# Patient Record
Sex: Male | Born: 1945 | Race: White | Marital: Married
Health system: Northeastern US, Academic
[De-identification: ages and names within clinical notes are randomized; demographics above are authoritative.]

## PROBLEM LIST (undated history)

## (undated) DIAGNOSIS — K922 Gastrointestinal hemorrhage, unspecified: Secondary | ICD-10-CM

## (undated) DIAGNOSIS — F411 Generalized anxiety disorder: Secondary | ICD-10-CM

## (undated) DIAGNOSIS — C4359 Malignant melanoma of other part of trunk: Secondary | ICD-10-CM

## (undated) DIAGNOSIS — Z9861 Coronary angioplasty status: Secondary | ICD-10-CM

## (undated) DIAGNOSIS — H9190 Unspecified hearing loss, unspecified ear: Secondary | ICD-10-CM

## (undated) DIAGNOSIS — M1990 Unspecified osteoarthritis, unspecified site: Secondary | ICD-10-CM

## (undated) DIAGNOSIS — I10 Essential (primary) hypertension: Secondary | ICD-10-CM

## (undated) DIAGNOSIS — E785 Hyperlipidemia, unspecified: Secondary | ICD-10-CM

## (undated) DIAGNOSIS — I2510 Atherosclerotic heart disease of native coronary artery without angina pectoris: Secondary | ICD-10-CM

## (undated) HISTORY — DX: Essential (primary) hypertension: I10

## (undated) HISTORY — DX: Gastrointestinal hemorrhage, unspecified: K92.2

## (undated) HISTORY — PX: KNEE ARTHROSCOPY W/ MENISCAL REPAIR: SHX1877

## (undated) HISTORY — DX: Unspecified osteoarthritis, unspecified site: M19.90

## (undated) HISTORY — DX: Coronary angioplasty status: Z98.61

## (undated) HISTORY — DX: Generalized anxiety disorder: F41.1

## (undated) HISTORY — DX: Atherosclerotic heart disease of native coronary artery without angina pectoris: I25.10

## (undated) HISTORY — DX: Hyperlipidemia, unspecified: E78.5

## (undated) HISTORY — DX: Unspecified hearing loss, unspecified ear: H91.90

## (undated) HISTORY — PX: CORONARY ANGIOPLASTY WITH STENT PLACEMENT: SHX49

## (undated) HISTORY — DX: Malignant melanoma of other part of trunk: C43.59

---

## 1993-10-10 HISTORY — PX: FRACTURE SURGERY: SHX138

## 2009-10-10 DIAGNOSIS — I2510 Atherosclerotic heart disease of native coronary artery without angina pectoris: Secondary | ICD-10-CM

## 2009-10-10 HISTORY — DX: Atherosclerotic heart disease of native coronary artery without angina pectoris: I25.10

## 2012-10-10 DIAGNOSIS — K922 Gastrointestinal hemorrhage, unspecified: Secondary | ICD-10-CM

## 2012-10-10 HISTORY — DX: Gastrointestinal hemorrhage, unspecified: K92.2

## 2015-02-08 DEATH — deceased

## 2015-12-30 DIAGNOSIS — L590 Erythema ab igne [dermatitis ab igne]: Secondary | ICD-10-CM | POA: Diagnosis not present

## 2015-12-30 DIAGNOSIS — L568 Other specified acute skin changes due to ultraviolet radiation: Secondary | ICD-10-CM | POA: Diagnosis not present

## 2015-12-30 DIAGNOSIS — Z09 Encounter for follow-up examination after completed treatment for conditions other than malignant neoplasm: Secondary | ICD-10-CM | POA: Diagnosis not present

## 2015-12-30 DIAGNOSIS — Z872 Personal history of diseases of the skin and subcutaneous tissue: Secondary | ICD-10-CM | POA: Diagnosis not present

## 2015-12-30 DIAGNOSIS — D225 Melanocytic nevi of trunk: Secondary | ICD-10-CM | POA: Diagnosis not present

## 2015-12-30 DIAGNOSIS — D1801 Hemangioma of skin and subcutaneous tissue: Secondary | ICD-10-CM | POA: Diagnosis not present

## 2015-12-30 DIAGNOSIS — L821 Other seborrheic keratosis: Secondary | ICD-10-CM | POA: Diagnosis not present

## 2015-12-30 DIAGNOSIS — I872 Venous insufficiency (chronic) (peripheral): Secondary | ICD-10-CM | POA: Diagnosis not present

## 2015-12-30 DIAGNOSIS — L814 Other melanin hyperpigmentation: Secondary | ICD-10-CM | POA: Diagnosis not present

## 2016-03-03 DIAGNOSIS — R600 Localized edema: Secondary | ICD-10-CM | POA: Diagnosis not present

## 2016-03-03 DIAGNOSIS — L568 Other specified acute skin changes due to ultraviolet radiation: Secondary | ICD-10-CM | POA: Diagnosis not present

## 2016-03-03 DIAGNOSIS — I872 Venous insufficiency (chronic) (peripheral): Secondary | ICD-10-CM | POA: Diagnosis not present

## 2016-03-04 ENCOUNTER — Ambulatory Visit

## 2016-04-05 ENCOUNTER — Ambulatory Visit: Admitting: Cardiovascular Disease

## 2016-04-05 ENCOUNTER — Ambulatory Visit (HOSPITAL_BASED_OUTPATIENT_CLINIC_OR_DEPARTMENT_OTHER)

## 2016-04-05 DIAGNOSIS — R001 Bradycardia, unspecified: Secondary | ICD-10-CM | POA: Diagnosis not present

## 2016-04-05 DIAGNOSIS — I10 Essential (primary) hypertension: Secondary | ICD-10-CM | POA: Diagnosis not present

## 2016-04-05 DIAGNOSIS — E7800 Pure hypercholesterolemia, unspecified: Secondary | ICD-10-CM | POA: Diagnosis not present

## 2016-04-05 DIAGNOSIS — E668 Other obesity: Secondary | ICD-10-CM | POA: Diagnosis not present
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
## 2016-04-11 ENCOUNTER — Ambulatory Visit: Admitting: Cardiovascular Disease

## 2016-04-11 DIAGNOSIS — I10 Essential (primary) hypertension: Secondary | ICD-10-CM | POA: Diagnosis not present

## 2016-04-11 LAB — HX BASIC METABOLIC PANEL
CASE NUMBER: 2017184000515
HX ANION GAP: 6 — NL (ref 3.0–11.0)
HX BUN: 21 mg/dL — ABNORMAL HIGH (ref 7.0–18.0)
HX CALCIUM LVL: 9.3 mg/dL — NL (ref 8.5–10.1)
HX CHLORIDE: 102 mmol/L — NL (ref 98.0–107.0)
HX CO2: 30 mmol/L — NL (ref 21.0–32.0)
HX CREATININE: 1.01 mg/dL — NL (ref 0.55–1.3)
HX GLUCOSE LVL: 88 mg/dL — NL (ref 65.0–110.0)
HX POTASSIUM LVL: 4.3 mmol/L — NL (ref 3.6–5.2)
HX SODIUM LVL: 138 mmol/L — NL (ref 136.0–145.0)

## 2016-04-11 LAB — HX GLOMERULAR FILTRATION RATE (ESTIMATED)
CASE NUMBER: 2017184000515
HX AFN AMER GLOMERULAR FILTRATION RATE: 88 mL/min/{1.73_m2}
HX NON-AFN AMER GLOMERULAR FILTRATION RATE: 76 mL/min/{1.73_m2}

## 2016-04-12 ENCOUNTER — Ambulatory Visit: Admitting: Cardiovascular Disease

## 2016-04-14 DIAGNOSIS — H903 Sensorineural hearing loss, bilateral: Secondary | ICD-10-CM | POA: Diagnosis not present

## 2016-04-18 ENCOUNTER — Ambulatory Visit

## 2016-04-18 DIAGNOSIS — I10 Essential (primary) hypertension: Secondary | ICD-10-CM | POA: Diagnosis not present

## 2016-04-18 NOTE — Progress Notes (Signed)
* * *         [facility]**        ---    Lawernce Ion. Sindy Messing, MD [facility] Kieth Brightly. Donnetta Simpers, MD [facility] Barbera Setters Byrd Hesselbach, MD  [facility];    Darlyn Chamber, MD [facility] Roosvelt Maser, MD [facility] Shanon Brow, MD [facility]  Nile Dear. Audley Hose, MD [facility]    Kandis Mannan A. Karie Mainland, MD [facility] Johnell Comings. MacNaught, MD [place] Mitchel Honour, MD Erma Pinto, MD [facility]    Colletta Maryland, MD Weston Anna, MD [facility] Kerby Moors, NP Maximino Greenland ,  NP        * * *     **Patient Name:** Jose Lindsey   **Date:** 04/18/2016    --- ---     **DOB:** [birth year]     **Referring Provider:** ANN Vincenza Hews   **Appointment Provider:** Nurse Savaughn Karwowski        * * *    04/18/2016  Progress Notes: Nurse Yohana Bartha    --- ---    ---        Current Medications    ---    Taking     * hydrochlorothiazide 12.5 mg capsule 1 cap(s) orally once a day    ---    * desoximetasone topical 0.25% cream 1 app applied topically 2 times a day    ---    * diphenhydramine 25 mg capsule 1 cap(s) orally Q.D. bedtime    ---    * lisinopril 20 mg tablet 1 tab(s) orally once a day    ---    * clopidogrel 75 mg tablet 1 tab(s) orally once a day    ---    * atorvastatin (Lipitor) 80 mg tablet 1 tab(s) orally once a day (at bedtime)    ---    * aspirin 81 mg enteric coated tablet 1 tab(s) orally once a day    ---    * Colace sodium 100 mg capsule 1 cap(s) orally once a day    ---      Past Medical History    ---      CAD    ---    - angina (2010): RCA- BMS x 2, OM 60%    ---    CHOL    ---    Obesity    ---    Diverticular disease    ---    GIB (2015)    ---        Reason for Appointment    ---      1\. Per dr Karie Mainland    ---    2\. BP check    ---      History of Present Illness    ---     __:    Jose Lindsey is a 70 yr old man Hx of HTN, CAD, hyperlipidemia, obesity last seen  on 04/04/2016. At his last visit, BP was suboptimal and HCTZ 12.5 mg was  prescribed. Reports feeling well from a cardiac perspective. Does c/o  lightheadness when first started taking the  HCTZ but has decreased. He states  has a had 6 lb weight loss and has been limiting the amount of salt he puts on  food. Denies cp, sob, n/v, or swelling.      Vital Signs    ---    HR 58, Ht 73, O2 Sat 97%, Right Arm BP 110/74, Left Arm BP 110/76.      Past Orders    ---  _Lab:BMP (Order Date - 04/11/2016) (Collection Date - 04/11/2016)_    BUN  21  H  7-18 - mg/dL    Creatinine  9.147    0.550-1.300 - mg/dL    Sodium Lvl  829    562-130 - mmol/L    Potassium lvl  4.3    3.6-5.2 - mmol/L    Chloride  102    98-107 - mmol/L    CO2  30    21-32 - mmol/L    Calcium Lvl  9.3    8.5-10.1 - mg/dL    Glucose Lvl  88    86-578 - mg/dL    Anion Gap  6    4-69 -    _Lab:Glomerular Filtration Rate (Estimated)-[facility] (Order Date - 04/11/2016)  (Collection Date - 04/11/2016)_    Afn Amer GFR  88     \- ml/min    Non-Afn Amer GFR  76     \- ml/min      Examination    ---     _HS_ :    General: Well developed and nourished. Pulmonary System clear to auscultation  bilaterally. Cardiac: no murmur, gallop or pericardial rub. Extremities no  clubbing, no edema. Skin: normal, no rash. Neurologic exam: grossly non-focal.  Pyschiatric appropriate.          Impression/Recommendations    ---       **1\. Essential Hypertension**    Clinical Notes: [name] comes into the office today for a BP check. Since his  last visit on 04/04/2016, HCTZ 12.5 mg was prescribed. Reports feeling well.  Recent blood work reviewed with pt. Congratulated pt on 6 lb. weight loss. His  BP today in the office manual cuff right arm 110/74 left arm 110/76 HR 58. I  advised Jose Lindsey to cont with all prescribed medications and follow up with Dr.  Karie Mainland as scheduled. Discussed the effects of dietary sodium intake and  importance of exercise. Jose Lindsey reports has a BP cuff at home and advised to  self monitor BP at home.    ---        **2\. Others**    Notes: Continue to take all your prescribed medications and follow up as  scheduled. Monitor BP at home and if BP trends  over 140/90 to notify the  office. Continue with the success of weight loss. Great job!.    Electronically signed by Teresa Pelton MD on 04/19/2016 at 07:08 AM EDT    Sign off status: Completed        * * *        [facility]    [address]    [place], [place] [address]    Tel: [phone]    Fax: [phone]              * * *          Patient: Jose Lindsey, Jose Lindsey DOB: 02-27-1946 Progress Note: Nurse Victorio Palm  04/18/2016    ---    Note generated by eClinicalWorks EMR/PM Software ([email or web address])

## 2016-06-01 DIAGNOSIS — L568 Other specified acute skin changes due to ultraviolet radiation: Secondary | ICD-10-CM | POA: Diagnosis not present

## 2016-06-01 DIAGNOSIS — I872 Venous insufficiency (chronic) (peripheral): Secondary | ICD-10-CM | POA: Diagnosis not present

## 2016-06-01 DIAGNOSIS — R600 Localized edema: Secondary | ICD-10-CM | POA: Diagnosis not present

## 2016-06-17 ENCOUNTER — Ambulatory Visit: Admitting: Cardiovascular Disease

## 2016-06-17 NOTE — Progress Notes (Signed)
* * *        **  [name]    --- ---    7 Y old Male, DOB: 09-02-1946    [address] [place], [place], [place] [address]    Home: [phone]    Provider: Santo Held, MD        * * *    Telephone Encounter    ---    Answered by   Kirke Shaggy  Date: 06/17/2016         Time: 09:14 AM    Reason   refill    --- ---            Refills  Continue atorvastatin (Lipitor) tablet, 80 mg, orally, 90 tablets, 1  tab(s), once a day (at bedtime), 90 days, Refills=3    --- ---          * * *                ---          * * *          Patient: Jose Lindsey, Jose Lindsey DOB: 25-Aug-1946 Provider: Santo Held, MD  06/17/2016    ---    Note generated by eClinicalWorks EMR/PM Software ([email or web address])

## 2016-06-29 DIAGNOSIS — Z23 Encounter for immunization: Secondary | ICD-10-CM | POA: Diagnosis not present

## 2016-08-26 ENCOUNTER — Ambulatory Visit (HOSPITAL_BASED_OUTPATIENT_CLINIC_OR_DEPARTMENT_OTHER): Admitting: Cardiovascular Disease

## 2016-08-30 ENCOUNTER — Ambulatory Visit (HOSPITAL_BASED_OUTPATIENT_CLINIC_OR_DEPARTMENT_OTHER): Admitting: Cardiovascular Disease

## 2016-08-31 ENCOUNTER — Ambulatory Visit (INDEPENDENT_AMBULATORY_CARE_PROVIDER_SITE_OTHER): Payer: Medicare Other | Admitting: Family Medicine

## 2016-08-31 ENCOUNTER — Encounter: Payer: Self-pay | Admitting: Family Medicine

## 2016-08-31 VITALS — BP 120/74 | HR 64 | Temp 98.6°F | Resp 18 | Ht 73.5 in | Wt 272.0 lb

## 2016-08-31 DIAGNOSIS — H9193 Unspecified hearing loss, bilateral: Secondary | ICD-10-CM | POA: Insufficient documentation

## 2016-08-31 DIAGNOSIS — F411 Generalized anxiety disorder: Secondary | ICD-10-CM

## 2016-08-31 DIAGNOSIS — E785 Hyperlipidemia, unspecified: Secondary | ICD-10-CM

## 2016-08-31 DIAGNOSIS — Z860100 Personal history of colon polyps, unspecified: Secondary | ICD-10-CM

## 2016-08-31 DIAGNOSIS — I152 Hypertension secondary to endocrine disorders: Secondary | ICD-10-CM | POA: Insufficient documentation

## 2016-08-31 DIAGNOSIS — I10 Essential (primary) hypertension: Secondary | ICD-10-CM

## 2016-08-31 DIAGNOSIS — Z7689 Persons encountering health services in other specified circumstances: Secondary | ICD-10-CM

## 2016-08-31 DIAGNOSIS — H903 Sensorineural hearing loss, bilateral: Secondary | ICD-10-CM

## 2016-08-31 DIAGNOSIS — Z8249 Family history of ischemic heart disease and other diseases of the circulatory system: Secondary | ICD-10-CM

## 2016-08-31 DIAGNOSIS — I2510 Atherosclerotic heart disease of native coronary artery without angina pectoris: Secondary | ICD-10-CM | POA: Diagnosis not present

## 2016-08-31 MED ORDER — LISINOPRIL 20 MG PO TABS: 20.0000 mg | ORAL_TABLET | Freq: Every day | ORAL | 3 refills | Status: DC

## 2016-08-31 MED ORDER — HYDROCHLOROTHIAZIDE 12.5 MG PO CAPS: 12.5000 mg | ORAL_CAPSULE | Freq: Every day | ORAL | 3 refills | Status: DC

## 2016-08-31 NOTE — Progress Notes (Signed)
Chief Complaint  Patient presents with  . Establish Care   hee to establish No records available Retired to [employer] from [employer] Here to be closer to daughter and grand daughters  Born premature, has profound hearing loss Very good at reading lips and communicating, has hearing aid R  CAD with stent placement 2020.  No angina. Good exercise tolerance.  Is obese, recently went from 300+ to 272.  Is congratulated.  "Retirement agrees with me"  Had 3 colonoscopies in 3 years for rectal bleeding.  Polyps found.  Most recent this year.  Due in 5 y.  Occasional anxiety.  Has no thad a xanax in months.  Patient Active Problem List   Diagnosis Date Noted  . CAD in native artery 08/31/2016  . Essential hypertension 08/31/2016  . HLD (hyperlipidemia) 08/31/2016  . Family history of heart disease in male family member before age 47 08/31/2016  . 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 Generalized anxiety disorder 08/31/2016  . History of colonic polyps 08/31/2016    Outpatient Encounter Prescriptions as of 08/31/2016  Medication Sig  . ALPRAZolam (XANAX) 0.5 MG tablet Take 0.5 mg by mouth as needed for anxiety.  Marland Kitchen atorvastatin (LIPITOR) 80 MG tablet Take 80 mg by mouth daily.  . clopidogrel (PLAVIX) 75 MG tablet Take 75 mg by mouth daily.  . diphenhydrAMINE (BENADRYL) 25 MG tablet Take 25 mg by mouth as needed.  . docusate sodium (COLACE) 100 MG capsule Take 100 mg by mouth daily as needed for mild constipation.  . hydrochlorothiazide (MICROZIDE) 12.5 MG capsule Take 1 capsule (12.5 mg total) by mouth daily.  Marland Kitchen lisinopril (PRINIVIL,ZESTRIL) 20 MG tablet Take 1 tablet (20 mg total) by mouth daily.   No facility-administered encounter medications on file as of 08/31/2016.     Past Medical History:  Diagnosis Date  . Arthritis   . Hard of hearing   . Hyperlipidemia   . Hypertension     Past Surgical History:  Procedure Laterality Date  . CORONARY ANGIOPLASTY WITH STENT PLACEMENT     . FRACTURE SURGERY  1995   right elbow  . KNEE ARTHROSCOPY W/ MENISCAL REPAIR      Social History   Social History  . Marital status: Married    Spouse name: rose  . Number of children: 2  . Years of education: 13   Occupational History  . retired     packing/shipping   Social History Main Topics  . Smoking status: Former Smoker    Packs/day: 0.30    Types: Cigarettes    Start date: 08/31/1966    Quit date: 08/31/1969  . Smokeless tobacco: Never Used  . Alcohol use 1.8 oz/week    2 Cans of beer, 1 Shots of liquor per week  . Drug use: No  . Sexual activity: Yes    Birth control/ protection: Post-menopausal   Other Topics Concern  . Not on file   Social History Narrative   Retired from [employer] with wife [name]   Two children   Two big dogs    Family History  Problem Relation Age of Onset  . Heart disease Mother 35    heart attack  . Lung disease Father     asbestosis  . Asthma Daughter   . Hyperlipidemia Daughter   . Alcohol abuse Paternal Uncle   . Cancer Paternal Grandfather     lung    Review of Systems  Constitutional: Negative for chills, fever and weight  loss.       Trying to lose weight  HENT: Negative for congestion and hearing loss.   Eyes: Negative for blurred vision and pain.  Respiratory: Negative for cough and shortness of breath.   Cardiovascular: Negative for chest pain and leg swelling.  Gastrointestinal: Negative for abdominal pain, constipation, diarrhea and heartburn.  Genitourinary: Negative for dysuria and frequency.  Musculoskeletal: Positive for joint pain. Negative for falls and myalgias.       Knee arthralgias  Neurological: Negative for dizziness, seizures and headaches.  Psychiatric/Behavioral: Negative for depression. The patient is not nervous/anxious and does not have insomnia.     BP 120/74 (BP Location: Right Arm, Patient Position: Sitting, Cuff Size: Large)   Pulse 64   Temp 98.6 F (37 C) (Oral)   Resp 18    Ht 6' 1.5" (1.867 m)   Wt 272 lb 0.6 oz (123.4 kg)   SpO2 97%   BMI 35.40 kg/m   Physical Exam  Constitutional: He is oriented to person, place, and time. He appears well-developed and well-nourished.  HENT:  Head: Normocephalic and atraumatic.  Right Ear: External ear normal.  Left Ear: External ear normal.  Mouth/Throat: Oropharynx is clear and moist.  Hearing impaired  Eyes: Conjunctivae are normal. Pupils are equal, round, and reactive to light.  Neck: Normal range of motion. Neck supple. No thyromegaly present.  Cardiovascular: Normal rate, regular rhythm and normal heart sounds.   Pulmonary/Chest: Effort normal and breath sounds normal. No respiratory distress.  Abdominal: Soft. Bowel sounds are normal.  obese  Musculoskeletal: Normal range of motion. He exhibits edema.  Lymphadenopathy:    He has no cervical adenopathy.  Neurological: He is alert and oriented to person, place, and time.  Gait normal  Skin: Skin is warm and dry.  Psychiatric: He has a normal mood and affect. His behavior is normal. Thought content normal.  Nursing note and vitals reviewed.  ASSESSMENT/PLAN:  1. CAD in native artery - Ambulatory referral to Cardiology  2. Essential hypertension 3. Hyperlipidemia, unspecified hyperlipidemia type 4. Family history of heart disease in male family member before age 9 5. Sensorineural hearing loss (SNHL) of both ears 6. Generalized anxiety disorder 7. History of colonic polyps 8 . Encounter to establish care with new doctor    Patient Instructions  Need old records Continue to stay active and eat well Congratulations on losing weight  See me in 3 months   Raylene Everts, MD

## 2016-08-31 NOTE — Patient Instructions (Signed)
Need old records Continue to stay active and eat well Congratulations on losing weight  See me in 3 months

## 2016-09-20 ENCOUNTER — Ambulatory Visit (HOSPITAL_BASED_OUTPATIENT_CLINIC_OR_DEPARTMENT_OTHER): Admitting: Cardiovascular Disease

## 2016-10-11 NOTE — Progress Notes (Addendum)
Cardiology Office Note   Date:  10/12/2016   ID:  [name], DOB July 11, 1946, MRN [id number]  PCP:  Blanchie Serve, MD  Cardiologist:   Dorris Carnes, MD   Pt referred for continued care of CAD   History of Present Illness: [name] is a 71 y.o. male with a history of CAD  Seen in primary care  Remote stent  Pt also history of HTN, HL  FHx CAD   Had 3 stents at [facility] in [place]  Evelena Leyden)  Patient has been breathing OK  Just started routine with wife  Cold weather hampers   Back when stents placed had sensation of disicomfort in chest  PRogressed    Then to arms He has not had any of these sympotms    He denies CP  Chest tightness, SOB   Current Meds  Medication Sig  . atorvastatin (LIPITOR) 80 MG tablet Take 80 mg by mouth daily.  . clopidogrel (PLAVIX) 75 MG tablet Take 75 mg by mouth daily.  . diphenhydrAMINE (BENADRYL) 25 MG tablet Take 25 mg by mouth as needed.  . docusate sodium (COLACE) 100 MG capsule Take 100 mg by mouth daily as needed for mild constipation.  . hydrochlorothiazide (MICROZIDE) 12.5 MG capsule Take 1 capsule (12.5 mg total) by mouth daily.  Marland Kitchen lisinopril (PRINIVIL,ZESTRIL) 20 MG tablet Take 1 tablet (20 mg total) by mouth daily.     Allergies:   Patient has no known allergies.   Past Medical History:  Diagnosis Date  . Arthritis   . Hard of hearing   . Hyperlipidemia   . Hypertension     Past Surgical History:  Procedure Laterality Date  . CORONARY ANGIOPLASTY WITH STENT PLACEMENT    . FRACTURE SURGERY  1995   right elbow  . KNEE ARTHROSCOPY W/ MENISCAL REPAIR       Social History:  The patient  reports that he quit smoking about 47 years ago. His smoking use included Cigarettes. He started smoking about 50 years ago. He smoked 0.30 packs per day. He has never used smokeless tobacco. He reports that he drinks about 1.8 oz of alcohol per week . He reports that he does not use drugs.   Family History:  The patient's family  history includes Alcohol abuse in his paternal uncle; Asthma in his daughter; Cancer in his paternal grandfather; Heart disease (age of onset: 75) in his mother; Hyperlipidemia in his daughter; Lung disease in his father.    ROS:  Please see the history of present illness. All other systems are reviewed and  Negative to the above problem except as noted.    PHYSICAL EXAM: VS:  BP 132/88   Pulse 66   Ht 6\' 2"  (1.88 m)   Wt 272 lb (123.4 kg)   SpO2 96%   BMI 34.92 kg/m   GEN: Obese 71 yo , in no acute distress  HEENT: normal  Neck: no JVD, carotid bruits, or masses Cardiac: RRR; no murmurs, rubs, or gallops,no edema  Respiratory:  clear to auscultation bilaterally, normal work of breathing GI: soft, nontender, nondistended, + BS  No hepatomegaly  MS: no deformity Moving all extremities   Skin: warm and dry, no rash Neuro:  Strength and sensation are intact Psych: euthymic mood, full affect   EKG:  EKG is ordered today.  SB 58 bpm       Lipid Panel No results found for: CHOL, TRIG, HDL, CHOLHDL, VLDL, LDLCALC, LDLDIRECT  Wt Readings from Last 3 Encounters:  10/12/16 272 lb (123.4 kg)  08/31/16 272 lb 0.6 oz (123.4 kg)      ASSESSMENT AND PLAN:  1  CAD  No symptoms to sugg active ischemia  WIll get records from [place] Continue current meds  2  HL  WIll check lipds   3  HTN  Pt says he is anxious  BP better at home  Told him to bring cuff to visits to confirm  4  Obesity  Discussed diet and wt loss     ADDENDUM  REcords from Signed, Dorris Carnes, MD  10/12/2016 8:43 AM    [place], [place], [place]  [address] Phone: [phone]; Fax: [phone]

## 2016-10-12 ENCOUNTER — Ambulatory Visit (INDEPENDENT_AMBULATORY_CARE_PROVIDER_SITE_OTHER): Payer: Medicare Other | Admitting: Internal Medicine

## 2016-10-12 ENCOUNTER — Encounter: Payer: Self-pay | Admitting: Internal Medicine

## 2016-10-12 VITALS — BP 132/88 | HR 66 | Ht 74.0 in | Wt 272.0 lb

## 2016-10-12 DIAGNOSIS — I10 Essential (primary) hypertension: Secondary | ICD-10-CM | POA: Diagnosis not present

## 2016-10-12 NOTE — Patient Instructions (Signed)
Your physician wants you to follow-up in:  Year with Dr Theressa Stamps will receive a reminder letter in the mail two months in advance. If you don't receive a letter, please call our office to schedule the follow-up appointment.  Your physician recommends that you continue on your current medications as directed. Please refer to the Current Medication list given to you today.   Your physician recommends that you return for lab work in:  2 weeks, FASTING lipids,cbc,BMET  If you need a refill on your cardiac medications before your next appointment, please call your pharmacy.    Thank you for choosing [facility] !

## 2016-11-07 DIAGNOSIS — I10 Essential (primary) hypertension: Secondary | ICD-10-CM | POA: Diagnosis not present

## 2016-11-07 LAB — CBC
HCT: 44.9 % (ref 38.5–50.0)
Hemoglobin: 15.2 g/dL (ref 13.2–17.1)
MCH: 29.5 pg (ref 27.0–33.0)
MCHC: 33.9 g/dL (ref 32.0–36.0)
MCV: 87 fL (ref 80.0–100.0)
MPV: 9.8 fL (ref 7.5–12.5)
Platelets: 216 10*3/uL (ref 140–400)
RBC: 5.16 MIL/uL (ref 4.20–5.80)
RDW: 14.4 % (ref 11.0–15.0)
WBC: 6 10*3/uL (ref 3.8–10.8)

## 2016-11-07 LAB — LIPID PANEL
CHOL/HDL RATIO: 2.8 ratio (ref <5.0)
Cholesterol: 138 mg/dL (ref <200)
HDL: 49 mg/dL (ref >40)
LDL CALC: 76 mg/dL (ref <100)
TRIGLYCERIDES: 65 mg/dL (ref <150)
VLDL: 13 mg/dL (ref <30)

## 2016-11-07 LAB — BASIC METABOLIC PANEL
BUN: 24 mg/dL (ref 7–25)
CALCIUM: 9.4 mg/dL (ref 8.6–10.3)
CHLORIDE: 106 mmol/L (ref 98–110)
CO2: 28 mmol/L (ref 20–31)
CREATININE: 0.99 mg/dL (ref 0.70–1.25)
Glucose, Bld: 97 mg/dL (ref 65–99)
Potassium: 4.2 mmol/L (ref 3.5–5.3)
SODIUM: 140 mmol/L (ref 135–146)

## 2016-11-14 ENCOUNTER — Telehealth: Payer: Self-pay | Admitting: Internal Medicine

## 2016-11-14 NOTE — Telephone Encounter (Signed)
Calling about lab results . Please call

## 2016-11-16 ENCOUNTER — Other Ambulatory Visit: Payer: Self-pay | Admitting: *Deleted

## 2016-11-16 MED ORDER — CLOPIDOGREL BISULFATE 75 MG PO TABS: 75.0000 mg | ORAL_TABLET | Freq: Every day | ORAL | 3 refills | Status: DC

## 2016-11-16 NOTE — Telephone Encounter (Signed)
Notes Recorded by Rodman Key, RN on 11/16/2016 at 11:17 AM EST Patient informed of lab results. Copy to PCP at his request.

## 2016-12-01 ENCOUNTER — Encounter: Payer: Self-pay | Admitting: Family Medicine

## 2016-12-01 ENCOUNTER — Ambulatory Visit (INDEPENDENT_AMBULATORY_CARE_PROVIDER_SITE_OTHER): Payer: Medicare Other | Admitting: Family Medicine

## 2016-12-01 VITALS — BP 112/64 | HR 60 | Temp 97.6°F | Resp 18 | Ht 74.0 in | Wt 279.0 lb

## 2016-12-01 DIAGNOSIS — Z23 Encounter for immunization: Secondary | ICD-10-CM | POA: Diagnosis not present

## 2016-12-01 DIAGNOSIS — C4359 Malignant melanoma of other part of trunk: Secondary | ICD-10-CM

## 2016-12-01 DIAGNOSIS — I2510 Atherosclerotic heart disease of native coronary artery without angina pectoris: Secondary | ICD-10-CM

## 2016-12-01 DIAGNOSIS — E785 Hyperlipidemia, unspecified: Secondary | ICD-10-CM

## 2016-12-01 DIAGNOSIS — I10 Essential (primary) hypertension: Secondary | ICD-10-CM | POA: Diagnosis not present

## 2016-12-01 HISTORY — DX: Malignant melanoma of other part of trunk: C43.59

## 2016-12-01 MED ORDER — ALPRAZOLAM 0.25 MG PO TABS: 0.2500 mg | ORAL_TABLET | Freq: Every evening | ORAL | 0 refills | Status: DC | PRN

## 2016-12-01 NOTE — Progress Notes (Signed)
Chief Complaint  Patient presents with  . Follow-up    3 month  feels well Has gained weight Discussed obesity.  Low fat/low carb diet.  More vegetable an fruit.  Walk every day that you are able to a goal of 150 min a week Never had pneumonia shot.  Agrees to prevnar today Is on plavix for CAD.  Was told not to take ASA by cardiology.  Will get those records.  Needs cardiologist here. 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  Needs referral to dermatology for yearly skin checks. Declines shingles shot.  States had shingles - but wife says he did not. Colo up to date  Patient Active Problem List   Diagnosis Date Noted  . 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 CAD in native artery 08/31/2016  . Essential hypertension 08/31/2016  . HLD (hyperlipidemia) 08/31/2016  . Family history of heart disease in male family member before age 78 08/31/2016  . 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 Generalized anxiety disorder 08/31/2016  . History of colonic polyps 08/31/2016    Outpatient Encounter Prescriptions as of 12/01/2016  Medication Sig  . ALPRAZolam (XANAX) 0.25 MG tablet Take 1 tablet (0.25 mg total) by mouth at bedtime as needed for anxiety.  Marland Kitchen atorvastatin (LIPITOR) 80 MG tablet Take 80 mg by mouth daily.  . clopidogrel (PLAVIX) 75 MG tablet Take 1 tablet (75 mg total) by mouth daily.  . diphenhydrAMINE (BENADRYL) 25 MG tablet Take 25 mg by mouth as needed.  . docusate sodium (COLACE) 100 MG capsule Take 100 mg by mouth daily as needed for mild constipation.  . hydrochlorothiazide (MICROZIDE) 12.5 MG capsule Take 1 capsule (12.5 mg total) by mouth daily.  Marland Kitchen lisinopril (PRINIVIL,ZESTRIL) 20 MG tablet Take 1 tablet (20 mg total) by mouth daily.  . [DISCONTINUED] ALPRAZolam (XANAX) 0.5 MG tablet Take 0.5 mg by mouth as needed for anxiety.   No facility-administered encounter medications on file as of 12/01/2016.     No Known Allergies  Review of Systems    Constitutional: Positive for unexpected weight change. Negative for activity change and appetite change.  HENT: Positive for hearing loss. Negative for congestion and dental problem.   Eyes: Negative for visual disturbance.  Respiratory: Negative for cough and wheezing.   Cardiovascular: Negative for chest pain, palpitations and leg swelling.  Gastrointestinal: Negative for blood in stool, constipation and diarrhea.  Genitourinary: Negative for difficulty urinating and frequency.  Musculoskeletal: Negative for arthralgias and back pain.  Skin: Negative for color change.  Neurological: Negative for dizziness and headaches.  Psychiatric/Behavioral: Positive for sleep disturbance. The patient is nervous/anxious.        Worried about daughter and grand daughters    Results for JIHAAD, HAGG (MRN [id number]) as of 12/01/2016 14:45  Ref. Range 11/07/2016 [id number]  BASIC METABOLIC PANEL Unknown Rpt  Sodium Latest Ref Range: 135 - 146 mmol/L 140  Potassium Latest Ref Range: 3.5 - 5.3 mmol/L 4.2  Chloride Latest Ref Range: 98 - 110 mmol/L 106  CO2 Latest Ref Range: 20 - 31 mmol/L 28  Glucose Latest Ref Range: 65 - 99 mg/dL 97  BUN Latest Ref Range: 7 - 25 mg/dL 24  Creatinine Latest Ref Range: 0.70 - 1.25 mg/dL 0.99  Calcium Latest Ref Range: 8.6 - 10.3 mg/dL 9.4  Total CHOL/HDL Ratio Latest Ref Range: <5.0 Ratio 2.8  Cholesterol Latest Ref Range: <200 mg/dL 138  HDL Cholesterol Latest Ref Range: >40 mg/dL 49  LDL (  calc) Latest Ref Range: <100 mg/dL 76  Triglycerides Latest Ref Range: <150 mg/dL 65  VLDL Latest Ref Range: <30 mg/dL 13  WBC Latest Ref Range: 3.8 - 10.8 K/uL 6.0  RBC Latest Ref Range: 4.20 - 5.80 MIL/uL 5.16  Hemoglobin Latest Ref Range: 13.2 - 17.1 g/dL 15.2  HCT Latest Ref Range: 38.5 - 50.0 % 44.9  MCV Latest Ref Range: 80.0 - 100.0 fL 87.0  MCH Latest Ref Range: 27.0 - 33.0 pg 29.5  MCHC Latest Ref Range: 32.0 - 36.0 g/dL 33.9  RDW Latest Ref Range: 11.0 - 15.0 % 14.4   Platelets Latest Ref Range: 140 - 400 K/uL 216  MPV Latest Ref Range: 7.5 - 12.5 fL 9.8   BP 112/64 (BP Location: Right Arm, Patient Position: Sitting, Cuff Size: Normal)   Pulse 60   Temp 97.6 F (36.4 C) (Temporal)   Resp 18   Ht 6\' 2"  (1.88 m)   Wt 279 lb 0.6 oz (126.6 kg)   SpO2 95%   BMI 35.83 kg/m   Physical Exam  Constitutional: He is oriented to person, place, and time. He appears well-developed and well-nourished.  HENT:  Head: Normocephalic and atraumatic.  Mouth/Throat: Oropharynx is clear and moist.  HOH  Eyes: Conjunctivae are normal. Pupils are equal, round, and reactive to light.  Neck: Normal range of motion. Neck supple. No thyromegaly present.  Cardiovascular: Normal rate, regular rhythm and normal heart sounds.   Pulmonary/Chest: Effort normal and breath sounds normal. No respiratory distress.  Abdominal: Soft. Bowel sounds are normal.  Obese abdomen  Musculoskeletal: Normal range of motion. He exhibits no edema.  Lymphadenopathy:    He has no cervical adenopathy.  Neurological: He is alert and oriented to person, place, and time.  Gait normal  Skin: Skin is warm and dry.  Psychiatric: He has a normal mood and affect. His behavior is normal. Thought content normal.  Nursing note and vitals reviewed.   ASSESSMENT/PLAN:  1. CAD in native artery asymptomatic  2. Essential hypertension *good control  3. Hyperlipidemia, unspecified hyperlipidemia type LDL is 76  4. 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 Need for vaccination with 13-polyvalent pneumococcal conjugate vaccine * - Pneumococcal conjugate vaccine 13-valent IM   Patient Instructions  I have refilled the alprazolam for as needed use Try to walk every day I have placed a referal to dermatology prevnar pneumonia shot today  See me in 6 months   Raylene Everts, MD

## 2016-12-01 NOTE — Patient Instructions (Addendum)
I have refilled the alprazolam for as needed use Try to walk every day I have placed a referal to dermatology prevnar pneumonia shot today  See me in 6 months

## 2016-12-07 DIAGNOSIS — Z1283 Encounter for screening for malignant neoplasm of skin: Secondary | ICD-10-CM | POA: Diagnosis not present

## 2016-12-07 DIAGNOSIS — D225 Melanocytic nevi of trunk: Secondary | ICD-10-CM | POA: Diagnosis not present

## 2016-12-07 DIAGNOSIS — D485 Neoplasm of uncertain behavior of skin: Secondary | ICD-10-CM | POA: Diagnosis not present

## 2016-12-07 DIAGNOSIS — L568 Other specified acute skin changes due to ultraviolet radiation: Secondary | ICD-10-CM | POA: Diagnosis not present

## 2016-12-12 DIAGNOSIS — L988 Other specified disorders of the skin and subcutaneous tissue: Secondary | ICD-10-CM | POA: Diagnosis not present

## 2016-12-12 DIAGNOSIS — D485 Neoplasm of uncertain behavior of skin: Secondary | ICD-10-CM | POA: Diagnosis not present

## 2017-06-01 ENCOUNTER — Ambulatory Visit: Payer: Medicare Other | Admitting: Family Medicine

## 2017-06-05 ENCOUNTER — Encounter: Payer: Self-pay | Admitting: Family Medicine

## 2017-06-05 ENCOUNTER — Ambulatory Visit (INDEPENDENT_AMBULATORY_CARE_PROVIDER_SITE_OTHER): Payer: Medicare Other | Admitting: Family Medicine

## 2017-06-05 VITALS — BP 120/76 | HR 72 | Temp 97.5°F | Resp 18 | Ht 74.0 in | Wt 285.1 lb

## 2017-06-05 DIAGNOSIS — I2510 Atherosclerotic heart disease of native coronary artery without angina pectoris: Secondary | ICD-10-CM | POA: Diagnosis not present

## 2017-06-05 DIAGNOSIS — Z23 Encounter for immunization: Secondary | ICD-10-CM

## 2017-06-05 DIAGNOSIS — I10 Essential (primary) hypertension: Secondary | ICD-10-CM

## 2017-06-05 DIAGNOSIS — E785 Hyperlipidemia, unspecified: Secondary | ICD-10-CM

## 2017-06-05 MED ORDER — LISINOPRIL 20 MG PO TABS: 20.0000 mg | ORAL_TABLET | Freq: Every day | ORAL | 3 refills | Status: DC

## 2017-06-05 MED ORDER — ATORVASTATIN CALCIUM 80 MG PO TABS: 80.0000 mg | ORAL_TABLET | Freq: Every day | ORAL | 3 refills | Status: DC

## 2017-06-05 NOTE — Patient Instructions (Signed)
You are doing well Stay active No change in medicine See me in six months Next visit need blood work and a PE

## 2017-06-05 NOTE — Progress Notes (Signed)
Chief Complaint  Patient presents with  . Follow-up    6 month   [name] is here for follow-up. He states he is feeling well. He is compliant with his medications. No chest pain or shortness of breath. No pedal edema. He saw cardiology in January. He is scheduled to go once a year. He saw Dr. Nevada Crane in dermatology. No evidence of recurrent melanoma. He is scheduled to come back once a year. Pressures well controlled. He has gained weight. This is discussed with him. He has not had any trouble with anxiety recently, or need for  Xanax.  Patient Active Problem List   Diagnosis Date Noted  . 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 CAD in native artery 08/31/2016  . Essential hypertension 08/31/2016  . HLD (hyperlipidemia) 08/31/2016  . Family history of heart disease in male family member before age 70 08/31/2016  . 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 Generalized anxiety disorder 08/31/2016  . History of colonic polyps 08/31/2016    Outpatient Encounter Prescriptions as of 06/05/2017  Medication Sig  . ALPRAZolam (XANAX) 0.25 MG tablet Take 1 tablet (0.25 mg total) by mouth at bedtime as needed for anxiety.  Marland Kitchen aspirin EC 81 MG tablet Take 81 mg by mouth daily.  Marland Kitchen atorvastatin (LIPITOR) 80 MG tablet Take 1 tablet (80 mg total) by mouth daily.  . clopidogrel (PLAVIX) 75 MG tablet Take 1 tablet (75 mg total) by mouth daily.  . diphenhydrAMINE (BENADRYL) 25 MG tablet Take 25 mg by mouth as needed.  . docusate sodium (COLACE) 100 MG capsule Take 100 mg by mouth daily as needed for mild constipation.  . hydrochlorothiazide (MICROZIDE) 12.5 MG capsule Take 1 capsule (12.5 mg total) by mouth daily.  Marland Kitchen lisinopril (PRINIVIL,ZESTRIL) 20 MG tablet Take 1 tablet (20 mg total) by mouth daily.   No facility-administered encounter medications on file as of 06/05/2017.     No Known Allergies  Review of Systems  Constitutional: Negative for activity change, appetite change and  unexpected weight change.  HENT: Positive for hearing loss. Negative for congestion and dental problem.   Eyes: Negative for photophobia and visual disturbance.  Respiratory: Negative for chest tightness and shortness of breath.   Cardiovascular: Negative for chest pain, palpitations and leg swelling.  Gastrointestinal: Negative for abdominal distention, constipation and diarrhea.  Genitourinary: Negative for difficulty urinating and frequency.  Musculoskeletal: Negative for arthralgias and back pain.  Neurological: Negative for dizziness and headaches.  Psychiatric/Behavioral: Negative for dysphoric mood. The patient is not nervous/anxious.     BP 120/76 (BP Location: Right Arm, Patient Position: Sitting, Cuff Size: Large)   Pulse 72   Temp (!) 97.5 F (36.4 C) (Temporal)   Resp 18   Ht 6\' 2"  (1.88 m)   Wt 285 lb 1.3 oz (129.3 kg)   SpO2 94%   BMI 36.60 kg/m   Physical Exam  Constitutional: He is oriented to person, place, and time. He appears well-developed and well-nourished.  HENT:  Head: Normocephalic and atraumatic.  Right Ear: External ear normal.  Left Ear: External ear normal.  Mouth/Throat: Oropharynx is clear and moist.  HOH  Eyes: Pupils are equal, round, and reactive to light. Conjunctivae are normal.  Neck: Normal range of motion. Neck supple. No thyromegaly present.  Cardiovascular: Normal rate, regular rhythm and normal heart sounds.   Pulmonary/Chest: Effort normal and breath sounds normal. No respiratory distress.  Abdominal: Soft. Bowel sounds are normal.  Obese abdomen  Musculoskeletal: Normal  range of motion. He exhibits no edema.  Lymphadenopathy:    He has no cervical adenopathy.  Neurological: He is alert and oriented to person, place, and time.  Gait normal  Skin: Skin is warm and dry.  Psychiatric: He has a normal mood and affect. His behavior is normal. Thought content normal.  Nursing note and vitals reviewed.   ASSESSMENT/PLAN:  1. CAD in  native artery   - CBC - COMPLETE METABOLIC PANEL WITH GFR - Lipid panel - Urinalysis, Routine w reflex microscopic  2. Essential hypertension  - CBC - COMPLETE METABOLIC PANEL WITH GFR - Lipid panel - Urinalysis, Routine w reflex microscopic  3. Hyperlipidemia, unspecified hyperlipidemia type  - CBC - COMPLETE METABOLIC PANEL WITH GFR - Lipid panel - Urinalysis, Routine w reflex microscopic  4. Needs flu shot  - Flu Vaccine QUAD 36+ mos IM   Patient Instructions  You are doing well Stay active No change in medicine See me in six months Next visit need blood work and a PE   Raylene Everts, MD

## 2017-10-23 ENCOUNTER — Other Ambulatory Visit: Payer: Self-pay | Admitting: Family Medicine

## 2017-10-23 NOTE — Telephone Encounter (Signed)
Seen [phone] 

## 2017-11-07 ENCOUNTER — Other Ambulatory Visit: Payer: Self-pay | Admitting: Internal Medicine

## 2017-11-21 DIAGNOSIS — I10 Essential (primary) hypertension: Secondary | ICD-10-CM | POA: Diagnosis not present

## 2017-11-21 DIAGNOSIS — I2510 Atherosclerotic heart disease of native coronary artery without angina pectoris: Secondary | ICD-10-CM | POA: Diagnosis not present

## 2017-11-21 DIAGNOSIS — E785 Hyperlipidemia, unspecified: Secondary | ICD-10-CM | POA: Diagnosis not present

## 2017-11-22 LAB — COMPLETE METABOLIC PANEL WITH GFR
AG Ratio: 1.5 (calc) (ref 1.0–2.5)
ALBUMIN MSPROF: 4 g/dL (ref 3.6–5.1)
ALKALINE PHOSPHATASE (APISO): 61 U/L (ref 40–115)
ALT: 24 U/L (ref 9–46)
AST: 18 U/L (ref 10–35)
BUN: 25 mg/dL (ref 7–25)
CO2: 24 mmol/L (ref 20–32)
Calcium: 9.5 mg/dL (ref 8.6–10.3)
Chloride: 105 mmol/L (ref 98–110)
Creat: 1.05 mg/dL (ref 0.70–1.25)
GFR, Est African American: 84 mL/min/{1.73_m2} (ref >=60)
GFR, Est Non African American: 72 mL/min/{1.73_m2} (ref >=60)
GLUCOSE: 96 mg/dL (ref 65–99)
Globulin: 2.6 g/dL (calc) (ref 1.9–3.7)
Potassium: 4.5 mmol/L (ref 3.5–5.3)
Sodium: 139 mmol/L (ref 135–146)
TOTAL PROTEIN: 6.6 g/dL (ref 6.1–8.1)
Total Bilirubin: 0.7 mg/dL (ref 0.2–1.2)

## 2017-11-22 LAB — URINALYSIS, ROUTINE W REFLEX MICROSCOPIC
Bilirubin Urine: NEGATIVE
Glucose, UA: NEGATIVE
Hgb urine dipstick: NEGATIVE
Ketones, ur: NEGATIVE
Leukocytes, UA: NEGATIVE
NITRITE: NEGATIVE
Protein, ur: NEGATIVE
SPECIFIC GRAVITY, URINE: 1.025 (ref 1.001–1.03)
pH: 5.5 (ref 5.0–8.0)

## 2017-11-22 LAB — CBC
HCT: 45.7 % (ref 38.5–50.0)
HEMOGLOBIN: 15.8 g/dL (ref 13.2–17.1)
MCH: 29.3 pg (ref 27.0–33.0)
MCHC: 34.6 g/dL (ref 32.0–36.0)
MCV: 84.6 fL (ref 80.0–100.0)
MPV: 10.1 fL (ref 7.5–12.5)
PLATELETS: 236 10*3/uL (ref 140–400)
RBC: 5.4 10*6/uL (ref 4.20–5.80)
RDW: 13.5 % (ref 11.0–15.0)
WBC: 6.6 10*3/uL (ref 3.8–10.8)

## 2017-11-22 LAB — LIPID PANEL
CHOLESTEROL: 145 mg/dL (ref <200)
HDL: 47 mg/dL (ref >40)
LDL CHOLESTEROL (CALC): 82 mg/dL
Non-HDL Cholesterol (Calc): 98 mg/dL (calc) (ref <130)
TRIGLYCERIDES: 82 mg/dL (ref <150)
Total CHOL/HDL Ratio: 3.1 (calc) (ref <5.0)

## 2017-12-06 ENCOUNTER — Encounter: Payer: Self-pay | Admitting: Family Medicine

## 2017-12-06 ENCOUNTER — Ambulatory Visit (INDEPENDENT_AMBULATORY_CARE_PROVIDER_SITE_OTHER): Payer: Medicare Other | Admitting: Family Medicine

## 2017-12-06 ENCOUNTER — Other Ambulatory Visit: Payer: Self-pay

## 2017-12-06 ENCOUNTER — Other Ambulatory Visit: Payer: Self-pay | Admitting: Family Medicine

## 2017-12-06 VITALS — BP 112/72 | HR 72 | Temp 98.6°F | Resp 18 | Ht 74.0 in | Wt 291.0 lb

## 2017-12-06 DIAGNOSIS — G4483 Primary cough headache: Secondary | ICD-10-CM

## 2017-12-06 DIAGNOSIS — Z0000 Encounter for general adult medical examination without abnormal findings: Secondary | ICD-10-CM

## 2017-12-06 DIAGNOSIS — I2510 Atherosclerotic heart disease of native coronary artery without angina pectoris: Secondary | ICD-10-CM

## 2017-12-06 DIAGNOSIS — E785 Hyperlipidemia, unspecified: Secondary | ICD-10-CM

## 2017-12-06 DIAGNOSIS — I10 Essential (primary) hypertension: Secondary | ICD-10-CM | POA: Diagnosis not present

## 2017-12-06 MED ORDER — ALPRAZOLAM 0.25 MG PO TABS: 0.2500 mg | ORAL_TABLET | Freq: Every evening | ORAL | 0 refills | Status: DC | PRN

## 2017-12-06 NOTE — Patient Instructions (Signed)
Pay attention to the cough headache Report if it gets worse Get yearly skin evaluations Overdue for eye exam  Lab tests and BP is good  Walk every day that you are able  return in six months Call sooner for problems You will see Caren Macadam MD next visit

## 2017-12-06 NOTE — Progress Notes (Signed)
Chief Complaint  Patient presents with  . Annual Exam   Patient is here for an annual physical examination. He has asymptomatic coronary artery disease.  Is compliant with his medications.  He sees cardiology regularly.  No chest pain or shortness of breath.  He has not compliant with regular exercise and this is discussed with him. Hypertension is well controlled. Hyperlipidemia is controlled, but not optimally.  His LDL is still 83.  He is on maximum dose of Lipitor.  Again, he would benefit from diet and exercise. He has a history of mental malignant melanoma on his back.  He has had several suspicious lesions removed.  He sees dermatology once a year.  He does not have any concern at this time. Immunizations are up-to-date.  Discussed shingles shot.  He does not want this at this time. He uses Xanax to help sleep as needed.  He uses 0.25 mg.  30 pills usually last for about 6 months. He has a new complaint today.  He states that when he coughs he gets a brief pain in his head.  He does not happen every time.  The headache lasts less than a minute.  He has no nausea or other symptoms.  No vision loss or neurologic symptoms.  He does not usually prone to headaches.  This only happens intermittently.  He wonders if it indicates a cranial problem.  I told him that with his brief headache, that is intermittent probably not.  He needs to pay attention to how long the headache lasts, whether it happens with increased frequency, and whether he develops increased pain or new symptoms.  If this is so, I would do further evaluation.  Patient Active Problem List   Diagnosis Date Noted  . 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 CAD in native artery 08/31/2016  . Essential hypertension 08/31/2016  . HLD (hyperlipidemia) 08/31/2016  . Family history of heart disease in male family member before age 46 08/31/2016  . 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 Generalized anxiety disorder 08/31/2016   . History of colonic polyps 08/31/2016    Outpatient Encounter Medications as of 12/06/2017  Medication Sig  . ALPRAZolam (XANAX) 0.25 MG tablet Take 1 tablet (0.25 mg total) by mouth at bedtime as needed for anxiety.  Marland Kitchen aspirin EC 81 MG tablet Take 81 mg by mouth daily.  Marland Kitchen atorvastatin (LIPITOR) 80 MG tablet Take 1 tablet (80 mg total) by mouth daily.  . clopidogrel (PLAVIX) 75 MG tablet TAKE 1 TABLET (75 MG TOTAL) BY MOUTH DAILY.  . diphenhydrAMINE (BENADRYL) 25 MG tablet Take 25 mg by mouth as needed.  . docusate sodium (COLACE) 100 MG capsule Take 100 mg by mouth daily as needed for mild constipation.  . hydrochlorothiazide (MICROZIDE) 12.5 MG capsule TAKE 1 CAPSULE (12.5 MG TOTAL) BY MOUTH DAILY.  Marland Kitchen lisinopril (PRINIVIL,ZESTRIL) 20 MG tablet Take 1 tablet (20 mg total) by mouth daily.   No facility-administered encounter medications on file as of 12/06/2017.     No Known Allergies  Review of Systems  Constitutional: Positive for unexpected weight change. Negative for activity change and appetite change.  HENT: Positive for hearing loss. Negative for congestion and dental problem.   Eyes: Negative for photophobia and visual disturbance.  Respiratory: Negative for chest tightness and shortness of breath.   Cardiovascular: Negative for chest pain, palpitations and leg swelling.  Gastrointestinal: Negative for abdominal distention, constipation and diarrhea.  Genitourinary: Negative for difficulty urinating and frequency.  Musculoskeletal: Negative for arthralgias and back pain.  Neurological: Negative for dizziness and headaches.  Psychiatric/Behavioral: Negative for dysphoric mood. The patient is not nervous/anxious.     Physical Exam   BP 112/72 (BP Location: Left Arm, Patient Position: Sitting, Cuff Size: Large)   Pulse 72   Temp 98.6 F (37 C) (Temporal)   Resp 18   Ht 6\' 2"  (1.88 m)   Wt 291 lb 0.6 oz (132 kg)   SpO2 94%   BMI 37.37 kg/m   General Appearance:     Alert, cooperative, no distress, appears stated age.  Obese  Head:    Normocephalic, without obvious abnormality, atraumatic  Eyes:    PERRL, conjunctiva/corneas clear, EOM's intact, fundi    benign, both eyes       Ears:    Normal TM's and external ear canals, both ears.  Hearing loss bilaterally, "deaf in both ears".  Nose:   Nares normal, septum midline, mucosa normal, no drainage   or sinus tenderness  Throat:   Lips, mucosa, and tongue normal; teeth and gums normal  Neck:   Supple, symmetrical, trachea midline, no adenopathy;       thyroid:  No enlargement/tenderness/nodules; no carotid   bruit   Back:     Symmetric, no curvature, ROM normal, no CVA tenderness  Lungs:     Clear to auscultation bilaterally, respirations unlabored  Chest wall:    No tenderness or deformity  Heart:    Regular rate and rhythm, S1 and S2 normal, no murmur, rub   or gallop  Abdomen:     Soft, non-tender, bowel sounds active all four quadrants,    no masses, no organomegaly.  Abdomen obese.  Detail difficult.  Central ventral hernia  Genitalia:    Normal male without lesion, discharge or tenderness  Extremities:   Extremities normal, atraumatic, no cyanosis or edema  Pulses:   1+ and symmetric in feet  Skin:   Skin color, texture, turgor normal, no rashes or suspicious lesions  Lymph nodes:   Cervical, supraclavicular, and axillary nodes normal  Neurologic:   Normal strength, sensation and reflexes      throughout     ASSESSMENT/PLAN:  1. Primary cough headache New symptom.  Likely benign.  We will workup with imaging if persists.  2. CAD in native artery Asymptomatic  3. Essential hypertension Controlled  4. Hyperlipidemia, unspecified hyperlipidemia type Marginally controlled  5. Annual PE No unexpected findings  Patient Instructions  Pay attention to the cough headache Report if it gets worse Get yearly skin evaluations Overdue for eye exam  Lab tests and BP is good  Walk every  day that you are able  return in six months Call sooner for problems You will see Caren Macadam MD next visit    Raylene Everts, MD

## 2018-03-15 ENCOUNTER — Encounter: Payer: Self-pay | Admitting: Family Medicine

## 2018-03-16 ENCOUNTER — Encounter: Payer: Self-pay | Admitting: Family Medicine

## 2018-04-10 ENCOUNTER — Other Ambulatory Visit: Payer: Self-pay

## 2018-04-10 ENCOUNTER — Ambulatory Visit (INDEPENDENT_AMBULATORY_CARE_PROVIDER_SITE_OTHER): Payer: Medicare Other | Admitting: Family Medicine

## 2018-04-10 ENCOUNTER — Encounter: Payer: Self-pay | Admitting: Family Medicine

## 2018-04-10 VITALS — BP 118/70 | Temp 98.3°F | Resp 20 | Ht 74.02 in | Wt 290.8 lb

## 2018-04-10 DIAGNOSIS — I10 Essential (primary) hypertension: Secondary | ICD-10-CM

## 2018-04-10 DIAGNOSIS — E785 Hyperlipidemia, unspecified: Secondary | ICD-10-CM | POA: Diagnosis not present

## 2018-04-10 DIAGNOSIS — I2510 Atherosclerotic heart disease of native coronary artery without angina pectoris: Secondary | ICD-10-CM | POA: Diagnosis not present

## 2018-04-10 DIAGNOSIS — Z23 Encounter for immunization: Secondary | ICD-10-CM

## 2018-04-10 NOTE — Patient Instructions (Signed)
     IF you received an x-ray today, you will receive an invoice from [facility]. Please contact [facility] at [phone] with questions or concerns regarding your invoice.   IF you received labwork today, you will receive an invoice from [facility]. Please contact [facility] at [phone] with questions or concerns regarding your invoice.   Our billing staff will not be able to assist you with questions regarding bills from these companies.  You will be contacted with the lab results as soon as they are available. The fastest way to get your results is to activate your My Chart account. Instructions are located on the last page of this paperwork. If you have not heard from us regarding the results in 2 weeks, please contact this office.     

## 2018-04-10 NOTE — Progress Notes (Signed)
7/2/201911:09 AM  [name] April 13, 1946, 72 y.o. male [id number]  Chief Complaint  Patient presents with  . Establish Care    HPI:   Patient is a 72 y.o. male with past medical history significant for CAD, HTN and HLP who presents today to establish care  Moved from [place] to [place] in 2017 to be closer to grandchildren Lives with his wife CAD with stents x 3 ~ 2010. Cards plan was plavix long-term. Denies h/o MI. Denies angina. Reports good endurance, mows his lawn, plays with his dog. Limited by "bad knees"  Takes meds as prescribed. Denies any side effects  Has history of lower GI bleed. Reports polyps but no other findings. No records available.   Sees derm for melanoma surveillance.   Congenital hearing loss. Wears hearing aids. Reads lips.   Patient Care Team: Ahmed Prima Neta Ehlers as Physician Assistant (Physician Assistant) Register, Luetta Nutting, PA-C as Librarian, academic (Dermatology)   Labs in 11/2017 LDL 82 CBC and CMP normal  Fall Risk  04/10/2018 12/06/2017 06/05/2017 12/01/2016 08/31/2016  Falls in the past year? No No No Yes Yes  Number falls in past yr: - - - 1 1  Injury with Fall? - - - No Yes     Depression screen [facility] 2/9 04/10/2018 12/06/2017 06/05/2017  Decreased Interest 0 0 0  Down, Depressed, Hopeless 0 0 0  PHQ - 2 Score 0 0 0    No Known Allergies  Prior to Admission medications   Medication Sig Start Date End Date Taking? Authorizing Provider  ALPRAZolam (XANAX) 0.25 MG tablet Take 1 tablet (0.25 mg total) by mouth at bedtime as needed for anxiety. 12/06/17  Yes Raylene Everts, MD  aspirin EC 81 MG tablet Take 81 mg by mouth daily.   Yes [provider]  atorvastatin (LIPITOR) 80 MG tablet Take 1 tablet (80 mg total) by mouth daily. 06/05/17  Yes Raylene Everts, MD  clopidogrel (PLAVIX) 75 MG tablet TAKE 1 TABLET (75 MG TOTAL) BY MOUTH DAILY. 11/07/17  Yes Fay Records, MD  diphenhydrAMINE (BENADRYL) 25 MG tablet Take 25 mg by mouth  as needed.   Yes [provider]  docusate sodium (COLACE) 100 MG capsule Take 100 mg by mouth daily as needed for mild constipation.   Yes [provider]  hydrochlorothiazide (MICROZIDE) 12.5 MG capsule TAKE 1 CAPSULE (12.5 MG TOTAL) BY MOUTH DAILY. 10/23/17  Yes Raylene Everts, MD  lisinopril (PRINIVIL,ZESTRIL) 20 MG tablet Take 1 tablet (20 mg total) by mouth daily. 06/05/17  Yes Raylene Everts, MD    Past Medical History:  Diagnosis Date  . Arthritis   . Generalized anxiety disorder   . Hard of hearing   . Hyperlipidemia   . Hypertension   . 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 CORONARY ANGIOPLASTY WITH STENT PLACEMENT    . FRACTURE SURGERY  1995   right elbow  . KNEE ARTHROSCOPY W/ MENISCAL REPAIR      Social History   Tobacco Use  . Smoking status: Former Smoker    Packs/day: 0.30    Types: Cigarettes    Start date: 08/31/1966    Last attempt to quit: 08/31/1969    Years since quitting: 48.6  . Smokeless tobacco: Never Used  Substance Use Topics  . Alcohol use: Yes    Alcohol/week: 1.8 oz    Types: 2 Cans of beer, 1 Shots of  liquor per week    Family History  Problem Relation Age of Onset  . Heart disease Mother 58       heart attack  . Lung disease Father        asbestosis  . Asthma Daughter   . Hyperlipidemia Daughter   . Alcohol abuse Paternal Uncle   . Cancer Paternal Grandfather        lung    Review of Systems  Constitutional: Negative for chills and fever.  Respiratory: Negative for cough and shortness of breath.   Cardiovascular: Negative for chest pain, palpitations and leg swelling.  Gastrointestinal: Negative for abdominal pain, blood in stool, melena, nausea and vomiting.     OBJECTIVE:  Blood pressure 118/70, temperature 98.3 F (36.8 C), temperature source Oral, resp. rate 20, height 6' 2.02" (1.88 m), weight 290 lb 12.8 oz (131.9 kg).  Physical  Exam  Constitutional: He is oriented to person, place, and time. He appears well-developed and well-nourished.  HENT:  Head: Normocephalic and atraumatic.  Mouth/Throat: Oropharynx is clear and moist.  Eyes: Pupils are equal, round, and reactive to light. EOM are normal.  Neck: Neck supple.  Cardiovascular: Normal rate and regular rhythm. Exam reveals no gallop and no friction rub.  No murmur heard. Pulmonary/Chest: Effort normal and breath sounds normal. He has no wheezes. He has no rales.  Neurological: He is alert and oriented to person, place, and time.  Skin: Skin is warm and dry.  Psychiatric: He has a normal mood and affect.  Nursing note and vitals reviewed.   ASSESSMENT and PLAN  1. Essential hypertension Controlled. Continue current regime.   2. CAD in native artery Controlled. Continue current regime. Followed by cards.  3. Hyperlipidemia, unspecified hyperlipidemia type Controlled. Continue current regime.   4. Need for vaccination - Pneumococcal polysaccharide vaccine 23-valent greater than or equal to 2yo subcutaneous/IM  Return in about 6 months (around 10/11/2018).    Rutherford Guys, MD [facility] at [facility] [place], [place] [address] Ph.  [phone] Fax [phone]

## 2018-06-01 ENCOUNTER — Telehealth: Payer: Self-pay | Admitting: Family Medicine

## 2018-06-01 NOTE — Telephone Encounter (Signed)
Called and spoke with pt to reschedule their appt. I advised of time, building number and late policy.

## 2018-06-05 ENCOUNTER — Encounter: Payer: Medicare Other | Admitting: Family Medicine

## 2018-07-20 ENCOUNTER — Encounter: Payer: Self-pay | Admitting: Family Medicine

## 2018-07-22 MED ORDER — LISINOPRIL 20 MG PO TABS: 20.0000 mg | ORAL_TABLET | Freq: Every day | ORAL | 3 refills | Status: DC

## 2018-07-22 MED ORDER — ATORVASTATIN CALCIUM 80 MG PO TABS: 80.0000 mg | ORAL_TABLET | Freq: Every day | ORAL | 3 refills | Status: DC

## 2018-07-22 MED ORDER — HYDROCHLOROTHIAZIDE 12.5 MG PO CAPS: 12.5000 mg | ORAL_CAPSULE | Freq: Every day | ORAL | 3 refills | Status: DC

## 2018-08-16 DIAGNOSIS — Z23 Encounter for immunization: Secondary | ICD-10-CM | POA: Diagnosis not present

## 2018-10-11 ENCOUNTER — Ambulatory Visit: Payer: Medicare Other | Admitting: Family Medicine

## 2018-10-12 ENCOUNTER — Encounter: Payer: Self-pay | Admitting: Family Medicine

## 2018-10-12 ENCOUNTER — Ambulatory Visit (INDEPENDENT_AMBULATORY_CARE_PROVIDER_SITE_OTHER): Payer: Medicare Other | Admitting: Family Medicine

## 2018-10-12 ENCOUNTER — Other Ambulatory Visit: Payer: Self-pay

## 2018-10-12 VITALS — BP 122/77 | HR 60 | Temp 98.6°F | Ht 74.02 in | Wt 283.8 lb

## 2018-10-12 DIAGNOSIS — I10 Essential (primary) hypertension: Secondary | ICD-10-CM

## 2018-10-12 DIAGNOSIS — E785 Hyperlipidemia, unspecified: Secondary | ICD-10-CM | POA: Diagnosis not present

## 2018-10-12 DIAGNOSIS — I2510 Atherosclerotic heart disease of native coronary artery without angina pectoris: Secondary | ICD-10-CM

## 2018-10-12 MED ORDER — HYDROCHLOROTHIAZIDE 12.5 MG PO CAPS: 12.5000 mg | ORAL_CAPSULE | Freq: Every day | ORAL | 3 refills | Status: DC

## 2018-10-12 MED ORDER — ROSUVASTATIN CALCIUM 20 MG PO TABS: 20.0000 mg | ORAL_TABLET | Freq: Every day | ORAL | 3 refills | Status: DC

## 2018-10-12 MED ORDER — LISINOPRIL 20 MG PO TABS: 20.0000 mg | ORAL_TABLET | Freq: Every day | ORAL | 3 refills | Status: DC

## 2018-10-12 MED ORDER — CLOPIDOGREL BISULFATE 75 MG PO TABS: 75.0000 mg | ORAL_TABLET | Freq: Every day | ORAL | 3 refills | Status: DC

## 2018-10-12 NOTE — Progress Notes (Signed)
1/3/202012:41 PM  [name] Aug 31, 1946, 73 y.o. male [id number]  Chief Complaint  Patient presents with  . Hypertension    lipitor, lisinopril  . Medication Refill    HPI:   Patient is a 73 y.o. male with past medical history significant for CAD s/p stents, HTN and HLP  who presents today for routine followup  Overall doing well Has no acute concerns Smoked for about 3 years in early 1s Has been walking more, eating better, has lost weght  Takes xanax prn, for insomnia, usually caused by family stressors Has not needed in a long time Does not need refills at this time  Had flu vaccine thru [facility] this season, nov 2019   Lab Results  Component Value Date   CHOL 145 11/21/2017   HDL 47 11/21/2017   LDLCALC 82 11/21/2017   TRIG 82 11/21/2017   CHOLHDL 3.1 11/21/2017    Fall Risk  10/12/2018 04/10/2018 12/06/2017 06/05/2017 12/01/2016  Falls in the past year? 0 No No No Yes  Number falls in past yr: - - - - 1  Injury with Fall? - - - - No     Depression screen [facility] 2/9 10/12/2018 04/10/2018 12/06/2017  Decreased Interest 0 0 0  Down, Depressed, Hopeless 0 0 0  PHQ - 2 Score 0 0 0    No Known Allergies  Prior to Admission medications   Medication Sig Start Date End Date Taking? Authorizing Provider  ALPRAZolam (XANAX) 0.25 MG tablet Take 1 tablet (0.25 mg total) by mouth at bedtime as needed for anxiety. 12/06/17  Yes Raylene Everts, MD  aspirin EC 81 MG tablet Take 81 mg by mouth daily.   Yes [provider]  atorvastatin (LIPITOR) 80 MG tablet Take 1 tablet (80 mg total) by mouth daily. 07/22/18  Yes Rutherford Guys, MD  clopidogrel (PLAVIX) 75 MG tablet TAKE 1 TABLET (75 MG TOTAL) BY MOUTH DAILY. 11/07/17  Yes Fay Records, MD  diphenhydrAMINE (BENADRYL) 25 MG tablet Take 25 mg by mouth as needed.   Yes [provider]  docusate sodium (COLACE) 100 MG capsule Take 100 mg by mouth daily as needed for mild constipation.   Yes [provider]  hydrochlorothiazide (MICROZIDE) 12.5 MG capsule Take 1 capsule (12.5 mg total) by mouth daily. 07/22/18  Yes Rutherford Guys, MD  lisinopril (PRINIVIL,ZESTRIL) 20 MG tablet Take 1 tablet (20 mg total) by mouth daily. 07/22/18  Yes Rutherford Guys, MD    Past Medical History:  Diagnosis Date  . Arthritis   . CAD S/P percutaneous coronary angioplasty 2011   stents x 3  . Generalized anxiety disorder   . Hard of hearing   . Hyperlipidemia   . Hypertension   . Lower GI bleed 2014   colonscopy was normal  . 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 CORONARY ANGIOPLASTY WITH STENT PLACEMENT    . FRACTURE SURGERY  1995   right elbow  . KNEE ARTHROSCOPY W/ MENISCAL REPAIR      Social History   Tobacco Use  . Smoking status: Former Smoker    Packs/day: 0.30    Types: Cigarettes    Start date: 08/31/1966    Last attempt to quit: 08/31/1969    Years since quitting: 49.1  . Smokeless tobacco: Never Used  Substance Use Topics  . Alcohol use: Yes    Alcohol/week: 3.0 standard drinks  Types: 2 Cans of beer, 1 Shots of liquor per week    Family History  Problem Relation Age of Onset  . Heart disease Mother 76       heart attack  . Lung disease Father        asbestosis  . Asthma Daughter   . Hyperlipidemia Daughter   . Alcohol abuse Paternal Uncle   . Cancer Paternal Grandfather        lung    Review of Systems  Constitutional: Negative for chills and fever.  Respiratory: Negative for cough and shortness of breath.   Cardiovascular: Negative for chest pain, palpitations and leg swelling.  Gastrointestinal: Negative for abdominal pain, nausea and vomiting.     OBJECTIVE:  Blood pressure 122/77, pulse 60, temperature 98.6 F (37 C), temperature source Oral, height 6' 2.02" (1.88 m), weight 283 lb 12.8 oz (128.7 kg), SpO2 94 %. Body mass index is 36.42 kg/m.   Wt Readings from Last 3 Encounters:    10/12/18 283 lb 12.8 oz (128.7 kg)  04/10/18 290 lb 12.8 oz (131.9 kg)  12/06/17 291 lb 0.6 oz (132 kg)    Physical Exam Vitals signs and nursing note reviewed.  Constitutional:      Appearance: He is well-developed.  HENT:     Head: Normocephalic and atraumatic.  Eyes:     Conjunctiva/sclera: Conjunctivae normal.     Pupils: Pupils are equal, round, and reactive to light.  Neck:     Musculoskeletal: Neck supple.  Cardiovascular:     Rate and Rhythm: Normal rate and regular rhythm.     Heart sounds: No murmur. No friction rub. No gallop.   Pulmonary:     Effort: Pulmonary effort is normal.     Breath sounds: Normal breath sounds. No wheezing or rales.  Skin:    General: Skin is warm and dry.  Neurological:     Mental Status: He is alert and oriented to person, place, and time.     ASSESSMENT and PLAN  1. Essential hypertension Controlled. Continue current regime.  - Lipid panel - TSH - CMP14+EGFR  2. CAD in native artery Asymptomatic. Referring to cards for surveillance and further management as needed - Ambulatory referral to Cardiology  3. Hyperlipidemia LDL goal <70 Last LDL above goal, changing to crestor. Cont with LFM  Other orders - rosuvastatin (CRESTOR) 20 MG tablet; Take 1 tablet (20 mg total) by mouth daily. - clopidogrel (PLAVIX) 75 MG tablet; Take 1 tablet (75 mg total) by mouth daily. - hydrochlorothiazide (MICROZIDE) 12.5 MG capsule; Take 1 capsule (12.5 mg total) by mouth daily. - lisinopril (PRINIVIL,ZESTRIL) 20 MG tablet; Take 1 tablet (20 mg total) by mouth daily.   Return in about 6 months (around 04/12/2019) for routine medical conditions.    Rutherford Guys, MD Primary Care at [place], [place] [address] Ph.  [phone] Fax [phone]

## 2018-10-12 NOTE — Patient Instructions (Signed)
° ° ° °  If you have lab work done today you will be contacted with your lab results within the next 2 weeks.  If you have not heard from us then please contact us. The fastest way to get your results is to register for My Chart. ° ° °IF you received an x-ray today, you will receive an invoice from [facility]. Please contact [facility] at [phone] with questions or concerns regarding your invoice.  ° °IF you received labwork today, you will receive an invoice from [facility]. Please contact [facility] at [phone] with questions or concerns regarding your invoice.  ° °Our billing staff will not be able to assist you with questions regarding bills from these companies. ° °You will be contacted with the lab results as soon as they are available. The fastest way to get your results is to activate your My Chart account. Instructions are located on the last page of this paperwork. If you have not heard from us regarding the results in 2 weeks, please contact this office. °  ° ° ° °

## 2018-10-13 LAB — CMP14+EGFR
ALT: 24 IU/L (ref 0–44)
AST: 15 IU/L (ref 0–40)
Albumin/Globulin Ratio: 2 (ref 1.2–2.2)
Albumin: 4.3 g/dL (ref 3.5–4.8)
Alkaline Phosphatase: 75 IU/L (ref 39–117)
BUN/Creatinine Ratio: 23 (ref 10–24)
BUN: 22 mg/dL (ref 8–27)
Bilirubin Total: 0.5 mg/dL (ref 0.0–1.2)
CO2: 24 mmol/L (ref 20–29)
Calcium: 9.7 mg/dL (ref 8.6–10.2)
Chloride: 101 mmol/L (ref 96–106)
Creatinine, Ser: 0.95 mg/dL (ref 0.76–1.27)
GFR calc Af Amer: 93 mL/min/{1.73_m2} (ref >59)
GFR calc non Af Amer: 81 mL/min/{1.73_m2} (ref >59)
Globulin, Total: 2.2 g/dL (ref 1.5–4.5)
Glucose: 90 mg/dL (ref 65–99)
Potassium: 4.2 mmol/L (ref 3.5–5.2)
Sodium: 141 mmol/L (ref 134–144)
Total Protein: 6.5 g/dL (ref 6.0–8.5)

## 2018-10-13 LAB — LIPID PANEL
Chol/HDL Ratio: 2.9 ratio (ref 0.0–5.0)
Cholesterol, Total: 145 mg/dL (ref 100–199)
HDL: 50 mg/dL (ref >39)
LDL Calculated: 73 mg/dL (ref 0–99)
Triglycerides: 112 mg/dL (ref 0–149)
VLDL Cholesterol Cal: 22 mg/dL (ref 5–40)

## 2018-10-13 LAB — TSH: TSH: 1.53 u[IU]/mL (ref 0.450–4.500)

## 2018-12-05 NOTE — Progress Notes (Signed)
Cardiology Office Note    Date:  12/06/2018   ID:  [name], DOB 09/13/1946, MRN [id number]  PCP:  Rutherford Guys, MD  Cardiologist:  Dr. Harrington Challenger  Chief Complaint: 24  Months follow up  History of Present Illness:   [name] is a 73 y.o. male with history of CAD, hypertension, hyperlipidemia and prior TIA.  Patient for follow-up.  Hx of CAD s/p BMS x 2 to RCA in [facility].   Seen by Dr. Harrington Challenger once January 2018 to establish cardiac care.  He was doing well.  Here today for follow up. The patient denies nausea, vomiting, fever, chest pain, palpitations, shortness of breath, orthopnea, PND, dizziness, syncope, cough, congestion, abdominal pain, hematochezia, melena, lower extremity edema. Recent lab work 10/12/18: normal renal functions and electrolytes, LDL 73   Past Medical History:  Diagnosis Date  . Arthritis   . CAD S/P percutaneous coronary angioplasty 2011   stents x 3  . Generalized anxiety disorder   . Hard of hearing   . Hyperlipidemia   . Hypertension   . Lower GI bleed 2014   colonscopy was normal  . 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 CORONARY ANGIOPLASTY WITH STENT PLACEMENT    . FRACTURE SURGERY  1995   right elbow  . KNEE ARTHROSCOPY W/ MENISCAL REPAIR      Current Medications: Prior to Admission medications   Medication Sig Start Date End Date Taking? Authorizing Provider  ALPRAZolam (XANAX) 0.25 MG tablet Take 1 tablet (0.25 mg total) by mouth at bedtime as needed for anxiety. 12/06/17   Raylene Everts, MD  aspirin EC 81 MG tablet Take 81 mg by mouth daily.    [provider]  clopidogrel (PLAVIX) 75 MG tablet Take 1 tablet (75 mg total) by mouth daily. 10/12/18   Rutherford Guys, MD  diphenhydrAMINE (BENADRYL) 25 MG tablet Take 25 mg by mouth as needed.    [provider]  docusate sodium (COLACE) 100 MG capsule Take 100 mg by mouth daily as needed for  mild constipation.    [provider]  hydrochlorothiazide (MICROZIDE) 12.5 MG capsule Take 1 capsule (12.5 mg total) by mouth daily. 10/12/18   Rutherford Guys, MD  lisinopril (PRINIVIL,ZESTRIL) 20 MG tablet Take 1 tablet (20 mg total) by mouth daily. 10/12/18   Rutherford Guys, MD  rosuvastatin (CRESTOR) 20 MG tablet Take 1 tablet (20 mg total) by mouth daily. 10/12/18   Rutherford Guys, MD    Allergies:   Patient has no known allergies.   Social History   Socioeconomic History  . Marital status: Married    Spouse name: rose  . Number of children: 2  . Years of education: 18  . Highest education level: Not on file  Occupational History  . Occupation: retired    Comment: packing/shipping  Social Needs  . Financial resource strain: Not on file  . Food insecurity:    Worry: Not on file    Inability: Not on file  . Transportation needs:    Medical: Not on file    Non-medical: Not on file  Tobacco Use  . Smoking status: Former Smoker    Packs/day: 0.30    Types: Cigarettes    Start date: 08/31/1966    Last attempt to quit: 08/31/1969    Years since quitting: 49.2  . Smokeless tobacco: Never Used  Substance and Sexual Activity  .  Alcohol use: Yes    Alcohol/week: 3.0 standard drinks    Types: 2 Cans of beer, 1 Shots of liquor per week  . Drug use: No  . Sexual activity: Yes  Lifestyle  . Physical activity:    Days per week: Not on file    Minutes per session: Not on file  . Stress: Not on file  Relationships  . Social connections:    Talks on phone: Not on file    Gets together: Not on file    Attends religious service: Not on file    Active member of club or organization: Not on file    Attends meetings of clubs or organizations: Not on file    Relationship status: Not on file  Other Topics Concern  . Not on file  Social History Narrative   Retired from [employer] with wife [name]   Two children   Two big dogs     Family History:  The patient's family  history includes Alcohol abuse in his paternal uncle; Asthma in his daughter; Cancer in his paternal grandfather; Heart disease (age of onset: 44) in his mother; Hyperlipidemia in his daughter; Lung disease in his father.   ROS:   Please see the history of present illness.    ROS All other systems reviewed and are negative.   PHYSICAL EXAM:   VS:  BP (!) 150/98   Pulse 63   Ht 6' 2.02" (1.88 m)   Wt 294 lb 12.8 oz (133.7 kg)   SpO2 94%   BMI 37.83 kg/m    GEN: Well nourished, well developed, in no acute distress  HEENT: normal  Neck: no JVD, carotid bruits, or masses Cardiac: RRR; no murmurs, rubs, or gallops,no edema  Respiratory:  clear to auscultation bilaterally, normal work of breathing GI: soft, nontender, nondistended, + BS MS: no deformity or atrophy  Skin: warm and dry, no rash Neuro:  Alert and Oriented x 3, Strength and sensation are intact Psych: euthymic mood, full affect  Wt Readings from Last 3 Encounters:  12/06/18 294 lb 12.8 oz (133.7 kg)  10/12/18 283 lb 12.8 oz (128.7 kg)  04/10/18 290 lb 12.8 oz (131.9 kg)      Studies/Labs Reviewed:   EKG:  EKG is ordered today.  The ekg ordered today demonstrates NSR  Recent Labs: 10/12/2018: ALT 24; BUN 22; Creatinine, Ser 0.95; Potassium 4.2; Sodium 141; TSH 1.530   Lipid Panel    Component Value Date/Time   CHOL 145 10/12/2018 1238   TRIG 112 10/12/2018 1238   HDL 50 10/12/2018 1238   CHOLHDL 2.9 10/12/2018 1238   CHOLHDL 3.1 11/21/2017 0814   VLDL 13 11/07/2016 0826   LDLCALC 73 10/12/2018 1238   LDLCALC 82 11/21/2017 0814    Additional studies/ records that were reviewed today include:   As sumamrized above   ASSESSMENT & PLAN:    1. CAD - No angina. Continue ASA, Plavix, statin.   2. HTN - Stable. Continue current medications.   3. HLD - 10/12/2018: Cholesterol, Total 145; HDL 50; LDL Calculated 73; Triglycerides 112  - Continue statin.  - Followed by PCP    Medication Adjustments/Labs  and Tests Ordered: Current medicines are reviewed at length with the patient today.  Concerns regarding medicines are outlined above.  Medication changes, Labs and Tests ordered today are listed in the Patient Instructions below. Patient Instructions  Medication Instructions:  Your physician recommends that you continue on your current medications as  directed. Please refer to the Current Medication list given to you today.   If you need a refill on your cardiac medications before your next appointment, please call your pharmacy.   Lab work: NONE  If you have labs (blood work) drawn today and your tests are completely normal, you will receive your results only by: Marland Kitchen MyChart Message (if you have MyChart) OR . A paper copy in the mail If you have any lab test that is abnormal or we need to change your treatment, we will call you to review the results.  Testing/Procedures: NONE  Follow-Up: At [facility], you and your health needs are our priority.  As part of our continuing mission to provide you with exceptional heart care, we have created designated Provider Care Teams.  These Care Teams include your primary Cardiologist (physician) and Advanced Practice Providers (APPs -  Physician Assistants and Nurse Practitioners) who all work together to provide you with the care you need, when you need it. . You will need a follow up appointment in:  12 months.  Please call our office 2 months in advance to schedule this appointment.  You may see Dr. Harrington Challenger or one of the following Advanced Practice Providers on your designated Care Team: . Richardson Dopp, PA-C . Vin Landrum Carbonell, PA-C . Daune Perch, NP  Any Other Special Instructions Will Be Listed Below (If Applicable).       Jarrett Soho, [place]  12/06/2018 10:33 AM    [facility] [place], [place], [place]  [address] Phone: [phone]; Fax: [phone]

## 2018-12-06 ENCOUNTER — Encounter: Payer: Self-pay | Admitting: Physician Assistant

## 2018-12-06 ENCOUNTER — Ambulatory Visit (INDEPENDENT_AMBULATORY_CARE_PROVIDER_SITE_OTHER): Payer: Medicare Other | Admitting: Physician Assistant

## 2018-12-06 VITALS — BP 150/98 | HR 63 | Ht 74.02 in | Wt 294.8 lb

## 2018-12-06 DIAGNOSIS — E785 Hyperlipidemia, unspecified: Secondary | ICD-10-CM

## 2018-12-06 DIAGNOSIS — I2510 Atherosclerotic heart disease of native coronary artery without angina pectoris: Secondary | ICD-10-CM | POA: Diagnosis not present

## 2018-12-06 DIAGNOSIS — I10 Essential (primary) hypertension: Secondary | ICD-10-CM | POA: Diagnosis not present

## 2018-12-06 DIAGNOSIS — Z8249 Family history of ischemic heart disease and other diseases of the circulatory system: Secondary | ICD-10-CM | POA: Diagnosis not present

## 2018-12-06 NOTE — Patient Instructions (Signed)
Medication Instructions:  Your physician recommends that you continue on your current medications as directed. Please refer to the Current Medication list given to you today.   If you need a refill on your cardiac medications before your next appointment, please call your pharmacy.   Lab work: NONE  If you have labs (blood work) drawn today and your tests are completely normal, you will receive your results only by: Marland Kitchen MyChart Message (if you have MyChart) OR . A paper copy in the mail If you have any lab test that is abnormal or we need to change your treatment, we will call you to review the results.  Testing/Procedures: NONE  Follow-Up: At [facility], you and your health needs are our priority.  As part of our continuing mission to provide you with exceptional heart care, we have created designated Provider Care Teams.  These Care Teams include your primary Cardiologist (physician) and Advanced Practice Providers (APPs -  Physician Assistants and Nurse Practitioners) who all work together to provide you with the care you need, when you need it. . You will need a follow up appointment in:  12 months.  Please call our office 2 months in advance to schedule this appointment.  You may see Dr. Harrington Challenger or one of the following Advanced Practice Providers on your designated Care Team: . Richardson Dopp, PA-C . Vin Bhagat, PA-C . Daune Perch, NP  Any Other Special Instructions Will Be Listed Below (If Applicable).

## 2019-04-18 ENCOUNTER — Encounter: Payer: Self-pay | Admitting: Family Medicine

## 2019-04-18 ENCOUNTER — Other Ambulatory Visit: Payer: Self-pay

## 2019-04-18 ENCOUNTER — Ambulatory Visit (INDEPENDENT_AMBULATORY_CARE_PROVIDER_SITE_OTHER): Payer: Medicare Other | Admitting: Family Medicine

## 2019-04-18 VITALS — BP 122/82 | HR 54 | Temp 98.4°F | Resp 16 | Wt 300.0 lb

## 2019-04-18 DIAGNOSIS — I10 Essential (primary) hypertension: Secondary | ICD-10-CM | POA: Diagnosis not present

## 2019-04-18 DIAGNOSIS — F5104 Psychophysiologic insomnia: Secondary | ICD-10-CM | POA: Diagnosis not present

## 2019-04-18 DIAGNOSIS — E785 Hyperlipidemia, unspecified: Secondary | ICD-10-CM

## 2019-04-18 MED ORDER — ALPRAZOLAM 0.25 MG PO TABS: 0.2500 mg | ORAL_TABLET | Freq: Every evening | ORAL | 0 refills | Status: DC | PRN

## 2019-04-18 NOTE — Patient Instructions (Signed)
° ° ° °  If you have lab work done today you will be contacted with your lab results within the next 2 weeks.  If you have not heard from us then please contact us. The fastest way to get your results is to register for My Chart. ° ° °IF you received an x-ray today, you will receive an invoice from [facility]. Please contact [facility] at [phone] with questions or concerns regarding your invoice.  ° °IF you received labwork today, you will receive an invoice from [facility]. Please contact [facility] at [phone] with questions or concerns regarding your invoice.  ° °Our billing staff will not be able to assist you with questions regarding bills from these companies. ° °You will be contacted with the lab results as soon as they are available. The fastest way to get your results is to activate your My Chart account. Instructions are located on the last page of this paperwork. If you have not heard from us regarding the results in 2 weeks, please contact this office. °  ° ° ° °

## 2019-04-18 NOTE — Progress Notes (Signed)
7/9/202011:51 AM  [name] 1946/02/07, 73 y.o., male [id number]  Chief Complaint  Patient presents with   Medication Refill    Alprazolam    Hypertension    follow up    HPI:   Patient is a 73 y.o. male with past medical history significant for CAD s/p stents, HTN and HLP, hard or hearing who presents today for routine followup  Last OV Jan 2020 Referred to cards, saw them Feb, f/u 1 year Changed to crestor as LDL above goal  Tolerating crestor well Has not been exercising as much, doing yard work, walks his dogs Has gained weight  Lab Results  Component Value Date   CHOL 145 10/12/2018   HDL 50 10/12/2018   LDLCALC 73 10/12/2018   TRIG 112 10/12/2018   CHOLHDL 2.9 10/12/2018    Reports that he checks BP at home Reports varies from 110-150s/80s  Lab Results  Component Value Date   CREATININE 0.95 10/12/2018   BUN 22 10/12/2018   NA 141 10/12/2018   K 4.2 10/12/2018   CL 101 10/12/2018   CO2 24 10/12/2018    Takes xanax prn for insomnia Last rx over a year ago  Hard of hearing, reads lips  Depression screen [facility] 2/9 04/18/2019 10/12/2018 04/10/2018  Decreased Interest 0 0 0  Down, Depressed, Hopeless 0 0 0  PHQ - 2 Score 0 0 0    Fall Risk  04/18/2019 10/12/2018 04/10/2018 12/06/2017 06/05/2017  Falls in the past year? 0 0 No No No  Number falls in past yr: - - - - -  Injury with Fall? - - - - -  Follow up Falls evaluation completed - - - -     No Known Allergies  Prior to Admission medications   Medication Sig Start Date End Date Taking? Authorizing Provider  ALPRAZolam (XANAX) 0.25 MG tablet Take 1 tablet (0.25 mg total) by mouth at bedtime as needed for anxiety. 12/06/17  Yes Raylene Everts, MD  aspirin EC 81 MG tablet Take 81 mg by mouth daily.   Yes [provider]  clopidogrel (PLAVIX) 75 MG tablet Take 1 tablet (75 mg total) by mouth daily. 10/12/18  Yes Rutherford Guys, MD  diphenhydrAMINE (BENADRYL) 25 MG tablet Take 25 mg by  mouth as needed.   Yes [provider]  docusate sodium (COLACE) 100 MG capsule Take 100 mg by mouth daily as needed for mild constipation.   Yes [provider]  hydrochlorothiazide (MICROZIDE) 12.5 MG capsule Take 1 capsule (12.5 mg total) by mouth daily. 10/12/18  Yes Rutherford Guys, MD  lisinopril (PRINIVIL,ZESTRIL) 20 MG tablet Take 1 tablet (20 mg total) by mouth daily. 10/12/18  Yes Rutherford Guys, MD  rosuvastatin (CRESTOR) 20 MG tablet Take 1 tablet (20 mg total) by mouth daily. 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    Past Surgical History:  Procedure Laterality Date   CORONARY ANGIOPLASTY WITH STENT PLACEMENT     FRACTURE SURGERY  1995   right elbow   KNEE ARTHROSCOPY W/ MENISCAL REPAIR      Social History  Tobacco Use   Smoking status: Former Smoker    Packs/day: 0.30    Types: Cigarettes    Start date: 08/31/1966    Quit date: 08/31/1969    Years since quitting: 49.6   Smokeless tobacco: Never Used  Substance Use Topics   Alcohol use: Yes    Alcohol/week: 3.0 standard drinks    Types: 2 Cans of beer, 1 Shots of liquor per week    Family History  Problem Relation Age of Onset   Heart disease Mother 43       heart attack   Lung disease Father        asbestosis   Asthma Daughter    Hyperlipidemia Daughter    Alcohol abuse Paternal Uncle    Cancer Paternal Grandfather        lung    Review of Systems  Constitutional: Negative for chills and fever.  Respiratory: Negative for cough and shortness of breath.   Cardiovascular: Negative for chest pain, palpitations and leg swelling.  Gastrointestinal: Negative for abdominal pain,  nausea and vomiting.     OBJECTIVE:  Today's Vitals   04/18/19 1116 04/18/19 1214  BP: (!) 142/82 122/82  Pulse: (!) 54   Resp: 16   Temp: 98.4 F (36.9 C)   TempSrc: Oral   SpO2: 94%   Weight: 300 lb (136.1 kg)    Body mass index is 38.5 kg/m.   Wt Readings from Last 3 Encounters:  04/18/19 300 lb (136.1 kg)  12/06/18 294 lb 12.8 oz (133.7 kg)  10/12/18 283 lb 12.8 oz (128.7 kg)    Physical Exam Vitals signs and nursing note reviewed.  Constitutional:      Appearance: He is well-developed.  HENT:     Head: Normocephalic and atraumatic.  Eyes:     Conjunctiva/sclera: Conjunctivae normal.     Pupils: Pupils are equal, round, and reactive to light.  Neck:     Musculoskeletal: Neck supple.  Cardiovascular:     Rate and Rhythm: Normal rate and regular rhythm.     Heart sounds: No murmur. No friction rub. No gallop.   Pulmonary:     Effort: Pulmonary effort is normal.     Breath sounds: Normal breath sounds. No wheezing or rales.  Musculoskeletal:     Right lower leg: Edema present.     Left lower leg: Edema present.     Comments: Trace pitting, distal LE  Skin:    General: Skin is warm and dry.  Neurological:     Mental Status: He is alert and oriented to person, place, and time.     ASSESSMENT and PLAN  1. Essential hypertension Controlled. Continue current regime.  - Care order/instruction:  2. Hyperlipidemia LDL goal <70 Checking labs today, medications will be adjusted as needed.  - Lipid panel - CMP14+EGFR  3. Psychophysiological insomnia pmp reviewed. Xanax refilled.  Other orders - ALPRAZolam (XANAX) 0.25 MG tablet; Take 1 tablet (0.25 mg total) by mouth at bedtime as needed for anxiety.  Return in about 6 months (around 10/19/2019).    Rutherford Guys, MD Primary Care at [place], [place] [address] Ph.  [phone] Fax [phone]

## 2019-04-19 LAB — LIPID PANEL
Chol/HDL Ratio: 2.5 ratio (ref 0.0–5.0)
Cholesterol, Total: 134 mg/dL (ref 100–199)
HDL: 54 mg/dL (ref >39)
LDL Calculated: 63 mg/dL (ref 0–99)
Triglycerides: 85 mg/dL (ref 0–149)
VLDL Cholesterol Cal: 17 mg/dL (ref 5–40)

## 2019-04-19 LAB — CMP14+EGFR
ALT: 24 IU/L (ref 0–44)
AST: 17 IU/L (ref 0–40)
Albumin/Globulin Ratio: 1.8 (ref 1.2–2.2)
Albumin: 4.3 g/dL (ref 3.7–4.7)
Alkaline Phosphatase: 55 IU/L (ref 39–117)
BUN/Creatinine Ratio: 24 (ref 10–24)
BUN: 21 mg/dL (ref 8–27)
Bilirubin Total: 0.5 mg/dL (ref 0.0–1.2)
CO2: 23 mmol/L (ref 20–29)
Calcium: 9.7 mg/dL (ref 8.6–10.2)
Chloride: 102 mmol/L (ref 96–106)
Creatinine, Ser: 0.87 mg/dL (ref 0.76–1.27)
GFR calc Af Amer: 100 mL/min/{1.73_m2} (ref >59)
GFR calc non Af Amer: 87 mL/min/{1.73_m2} (ref >59)
Globulin, Total: 2.4 g/dL (ref 1.5–4.5)
Glucose: 88 mg/dL (ref 65–99)
Potassium: 4.4 mmol/L (ref 3.5–5.2)
Sodium: 141 mmol/L (ref 134–144)
Total Protein: 6.7 g/dL (ref 6.0–8.5)

## 2019-06-12 DIAGNOSIS — Z23 Encounter for immunization: Secondary | ICD-10-CM | POA: Diagnosis not present

## 2019-08-20 ENCOUNTER — Encounter (HOSPITAL_COMMUNITY): Payer: Self-pay | Admitting: *Deleted

## 2019-08-20 ENCOUNTER — Emergency Department (HOSPITAL_COMMUNITY): Payer: Medicare Other

## 2019-08-20 ENCOUNTER — Other Ambulatory Visit: Payer: Self-pay

## 2019-08-20 ENCOUNTER — Inpatient Hospital Stay (HOSPITAL_COMMUNITY)
Admission: EM | Admit: 2019-08-20 | Discharge: 2019-08-24 | DRG: 175 | Disposition: A | Discharge status: Home / Self Care | Payer: Medicare Other | Attending: Family Medicine | Admitting: Family Medicine

## 2019-08-20 ENCOUNTER — Inpatient Hospital Stay (HOSPITAL_COMMUNITY): Payer: Medicare Other

## 2019-08-20 DIAGNOSIS — R079 Chest pain, unspecified: Secondary | ICD-10-CM | POA: Diagnosis not present

## 2019-08-20 DIAGNOSIS — I2699 Other pulmonary embolism without acute cor pulmonale: Secondary | ICD-10-CM

## 2019-08-20 DIAGNOSIS — I248 Other forms of acute ischemic heart disease: Secondary | ICD-10-CM | POA: Diagnosis present

## 2019-08-20 DIAGNOSIS — E785 Hyperlipidemia, unspecified: Secondary | ICD-10-CM | POA: Diagnosis present

## 2019-08-20 DIAGNOSIS — J9601 Acute respiratory failure with hypoxia: Secondary | ICD-10-CM | POA: Diagnosis present

## 2019-08-20 DIAGNOSIS — F411 Generalized anxiety disorder: Secondary | ICD-10-CM | POA: Diagnosis present

## 2019-08-20 DIAGNOSIS — Z7982 Long term (current) use of aspirin: Secondary | ICD-10-CM

## 2019-08-20 DIAGNOSIS — Z7902 Long term (current) use of antithrombotics/antiplatelets: Secondary | ICD-10-CM

## 2019-08-20 DIAGNOSIS — I2510 Atherosclerotic heart disease of native coronary artery without angina pectoris: Secondary | ICD-10-CM | POA: Diagnosis present

## 2019-08-20 DIAGNOSIS — K922 Gastrointestinal hemorrhage, unspecified: Secondary | ICD-10-CM | POA: Diagnosis present

## 2019-08-20 DIAGNOSIS — D6832 Hemorrhagic disorder due to extrinsic circulating anticoagulants: Secondary | ICD-10-CM | POA: Diagnosis not present

## 2019-08-20 DIAGNOSIS — Z79899 Other long term (current) drug therapy: Secondary | ICD-10-CM | POA: Diagnosis not present

## 2019-08-20 DIAGNOSIS — Z87891 Personal history of nicotine dependence: Secondary | ICD-10-CM

## 2019-08-20 DIAGNOSIS — M549 Dorsalgia, unspecified: Secondary | ICD-10-CM | POA: Diagnosis present

## 2019-08-20 DIAGNOSIS — Z8249 Family history of ischemic heart disease and other diseases of the circulatory system: Secondary | ICD-10-CM | POA: Diagnosis not present

## 2019-08-20 DIAGNOSIS — I10 Essential (primary) hypertension: Secondary | ICD-10-CM | POA: Diagnosis present

## 2019-08-20 DIAGNOSIS — R05 Cough: Secondary | ICD-10-CM | POA: Diagnosis not present

## 2019-08-20 DIAGNOSIS — Z809 Family history of malignant neoplasm, unspecified: Secondary | ICD-10-CM

## 2019-08-20 DIAGNOSIS — I499 Cardiac arrhythmia, unspecified: Secondary | ICD-10-CM | POA: Diagnosis not present

## 2019-08-20 DIAGNOSIS — H9190 Unspecified hearing loss, unspecified ear: Secondary | ICD-10-CM | POA: Diagnosis present

## 2019-08-20 DIAGNOSIS — R0789 Other chest pain: Secondary | ICD-10-CM | POA: Diagnosis not present

## 2019-08-20 DIAGNOSIS — I2692 Saddle embolus of pulmonary artery without acute cor pulmonale: Principal | ICD-10-CM | POA: Diagnosis present

## 2019-08-20 DIAGNOSIS — Z955 Presence of coronary angioplasty implant and graft: Secondary | ICD-10-CM | POA: Diagnosis not present

## 2019-08-20 DIAGNOSIS — J181 Lobar pneumonia, unspecified organism: Secondary | ICD-10-CM | POA: Diagnosis present

## 2019-08-20 DIAGNOSIS — Z20828 Contact with and (suspected) exposure to other viral communicable diseases: Secondary | ICD-10-CM | POA: Diagnosis present

## 2019-08-20 DIAGNOSIS — R040 Epistaxis: Secondary | ICD-10-CM | POA: Diagnosis not present

## 2019-08-20 DIAGNOSIS — R072 Precordial pain: Secondary | ICD-10-CM | POA: Diagnosis present

## 2019-08-20 DIAGNOSIS — I152 Hypertension secondary to endocrine disorders: Secondary | ICD-10-CM | POA: Diagnosis present

## 2019-08-20 DIAGNOSIS — T45515A Adverse effect of anticoagulants, initial encounter: Secondary | ICD-10-CM | POA: Diagnosis not present

## 2019-08-20 DIAGNOSIS — R918 Other nonspecific abnormal finding of lung field: Secondary | ICD-10-CM | POA: Diagnosis not present

## 2019-08-20 DIAGNOSIS — I2602 Saddle embolus of pulmonary artery with acute cor pulmonale: Secondary | ICD-10-CM | POA: Diagnosis not present

## 2019-08-20 DIAGNOSIS — Z85820 Personal history of malignant melanoma of skin: Secondary | ICD-10-CM

## 2019-08-20 DIAGNOSIS — R739 Hyperglycemia, unspecified: Secondary | ICD-10-CM | POA: Diagnosis not present

## 2019-08-20 DIAGNOSIS — R0902 Hypoxemia: Secondary | ICD-10-CM | POA: Diagnosis not present

## 2019-08-20 DIAGNOSIS — R0602 Shortness of breath: Secondary | ICD-10-CM | POA: Diagnosis not present

## 2019-08-20 DIAGNOSIS — J189 Pneumonia, unspecified organism: Secondary | ICD-10-CM

## 2019-08-20 LAB — PROCALCITONIN: Procalcitonin: 0.27 ng/mL

## 2019-08-20 LAB — CBC WITH DIFFERENTIAL/PLATELET
Abs Immature Granulocytes: 0.07 10*3/uL (ref 0.00–0.07)
Basophils Absolute: 0 10*3/uL (ref 0.0–0.1)
Basophils Relative: 0 %
Eosinophils Absolute: 0 10*3/uL (ref 0.0–0.5)
Eosinophils Relative: 0 %
HCT: 48.6 % (ref 39.0–52.0)
Hemoglobin: 15.4 g/dL (ref 13.0–17.0)
Immature Granulocytes: 1 %
Lymphocytes Relative: 6 %
Lymphs Abs: 0.8 10*3/uL (ref 0.7–4.0)
MCH: 28.8 pg (ref 26.0–34.0)
MCHC: 31.7 g/dL (ref 30.0–36.0)
MCV: 90.8 fL (ref 80.0–100.0)
Monocytes Absolute: 1.6 10*3/uL — ABNORMAL HIGH (ref 0.1–1.0)
Monocytes Relative: 11 %
Neutro Abs: 11.8 10*3/uL — ABNORMAL HIGH (ref 1.7–7.7)
Neutrophils Relative %: 82 %
Platelets: 218 10*3/uL (ref 150–400)
RBC: 5.35 MIL/uL (ref 4.22–5.81)
RDW: 14.6 % (ref 11.5–15.5)
WBC: 14.3 10*3/uL — ABNORMAL HIGH (ref 4.0–10.5)
nRBC: 0 % (ref 0.0–0.2)

## 2019-08-20 LAB — COMPREHENSIVE METABOLIC PANEL
ALT: 30 U/L (ref 0–44)
AST: 26 U/L (ref 15–41)
Albumin: 3.8 g/dL (ref 3.5–5.0)
Alkaline Phosphatase: 74 U/L (ref 38–126)
Anion gap: 11 (ref 5–15)
BUN: 27 mg/dL — ABNORMAL HIGH (ref 8–23)
CO2: 27 mmol/L (ref 22–32)
Calcium: 9.4 mg/dL (ref 8.9–10.3)
Chloride: 100 mmol/L (ref 98–111)
Creatinine, Ser: 1.07 mg/dL (ref 0.61–1.24)
GFR calc Af Amer: >60 mL/min (ref >60)
GFR calc non Af Amer: >60 mL/min (ref >60)
Glucose, Bld: 119 mg/dL — ABNORMAL HIGH (ref 70–99)
Potassium: 4 mmol/L (ref 3.5–5.1)
Sodium: 138 mmol/L (ref 135–145)
Total Bilirubin: 1.2 mg/dL (ref 0.3–1.2)
Total Protein: 7.8 g/dL (ref 6.5–8.1)

## 2019-08-20 LAB — GLUCOSE, CAPILLARY: Glucose-Capillary: 105 mg/dL — ABNORMAL HIGH (ref 70–99)

## 2019-08-20 LAB — PROTIME-INR
INR: 1.1 (ref 0.8–1.2)
Prothrombin Time: 14 seconds (ref 11.4–15.2)

## 2019-08-20 LAB — TROPONIN I (HIGH SENSITIVITY)
Troponin I (High Sensitivity): 775 ng/L (ref <18)
Troponin I (High Sensitivity): 653 ng/L (ref <18)
Troponin I (High Sensitivity): 685 ng/L (ref <18)

## 2019-08-20 LAB — BRAIN NATRIURETIC PEPTIDE: B Natriuretic Peptide: 126 pg/mL — ABNORMAL HIGH (ref 0.0–100.0)

## 2019-08-20 LAB — LACTIC ACID, PLASMA
Lactic Acid, Venous: 1.5 mmol/L (ref 0.5–1.9)
Lactic Acid, Venous: 1.1 mmol/L (ref 0.5–1.9)

## 2019-08-20 LAB — SARS CORONAVIRUS 2 (TAT 6-24 HRS): SARS Coronavirus 2: NEGATIVE

## 2019-08-20 LAB — APTT: aPTT: 33 seconds (ref 24–36)

## 2019-08-20 MED ORDER — HYDROMORPHONE HCL 1 MG/ML IJ SOLN: 0.5000 mg | INTRAMUSCULAR | Status: DC | PRN

## 2019-08-20 MED ORDER — ONDANSETRON HCL 4 MG PO TABS: 4.0000 mg | ORAL_TABLET | Freq: Four times a day (QID) | ORAL | Status: DC | PRN

## 2019-08-20 MED ORDER — SODIUM CHLORIDE 0.9 % IV SOLN: 500.0000 mg | INTRAVENOUS | Status: DC

## 2019-08-20 MED ORDER — ROSUVASTATIN CALCIUM 20 MG PO TABS
20.0000 mg | ORAL_TABLET | Freq: Every day | ORAL | Status: DC
  Administered 2019-08-20 – 2019-08-24 (×5): 20 mg via ORAL
  Filled 2019-08-20 (×5): qty 1

## 2019-08-20 MED ORDER — ASPIRIN EC 81 MG PO TBEC
81.0000 mg | DELAYED_RELEASE_TABLET | Freq: Every day | ORAL | Status: DC
  Administered 2019-08-21 – 2019-08-22 (×2): 81 mg via ORAL
  Filled 2019-08-20 (×2): qty 1

## 2019-08-20 MED ORDER — ENOXAPARIN SODIUM 40 MG/0.4ML ~~LOC~~ SOLN: 40.0000 mg | SUBCUTANEOUS | Status: DC

## 2019-08-20 MED ORDER — SODIUM CHLORIDE 0.9 % IV SOLN
1.0000 g | Freq: Once | INTRAVENOUS | Status: AC
  Administered 2019-08-20: 1 g via INTRAVENOUS
  Filled 2019-08-20: qty 10

## 2019-08-20 MED ORDER — SODIUM CHLORIDE 0.9 % IV SOLN: 1.0000 g | INTRAVENOUS | Status: DC

## 2019-08-20 MED ORDER — MORPHINE SULFATE (PF) 2 MG/ML IV SOLN: 2.0000 mg | INTRAVENOUS | Status: DC | PRN

## 2019-08-20 MED ORDER — IOHEXOL 350 MG/ML SOLN
100.0000 mL | Freq: Once | INTRAVENOUS | Status: AC | PRN
  Administered 2019-08-20: 100 mL via INTRAVENOUS

## 2019-08-20 MED ORDER — KETOROLAC TROMETHAMINE 30 MG/ML IJ SOLN
30.0000 mg | Freq: Three times a day (TID) | INTRAMUSCULAR | Status: DC | PRN
  Administered 2019-08-20: 30 mg via INTRAVENOUS
  Filled 2019-08-20: qty 1

## 2019-08-20 MED ORDER — SODIUM CHLORIDE 0.9 % IV SOLN
500.0000 mg | Freq: Once | INTRAVENOUS | Status: AC
  Administered 2019-08-20: 500 mg via INTRAVENOUS
  Filled 2019-08-20: qty 500

## 2019-08-20 MED ORDER — ALPRAZOLAM 0.25 MG PO TABS: 0.2500 mg | ORAL_TABLET | Freq: Every evening | ORAL | Status: DC | PRN

## 2019-08-20 MED ORDER — ACETAMINOPHEN 325 MG PO TABS: 650.0000 mg | ORAL_TABLET | Freq: Four times a day (QID) | ORAL | Status: DC | PRN

## 2019-08-20 MED ORDER — CLOPIDOGREL BISULFATE 75 MG PO TABS
75.0000 mg | ORAL_TABLET | Freq: Every day | ORAL | Status: DC
  Administered 2019-08-21 – 2019-08-24 (×4): 75 mg via ORAL
  Filled 2019-08-20 (×4): qty 1

## 2019-08-20 MED ORDER — ACETAMINOPHEN 650 MG RE SUPP: 650.0000 mg | Freq: Four times a day (QID) | RECTAL | Status: DC | PRN

## 2019-08-20 MED ORDER — HYDROMORPHONE HCL 1 MG/ML IJ SOLN
0.5000 mg | Freq: Once | INTRAMUSCULAR | Status: AC
  Administered 2019-08-20: 0.5 mg via INTRAVENOUS
  Filled 2019-08-20: qty 1

## 2019-08-20 MED ORDER — HYDROMORPHONE HCL 1 MG/ML IJ SOLN
1.0000 mg | Freq: Once | INTRAMUSCULAR | Status: AC
  Administered 2019-08-20: 1 mg via INTRAVENOUS
  Filled 2019-08-20: qty 1

## 2019-08-20 MED ORDER — DOCUSATE SODIUM 100 MG PO CAPS
200.0000 mg | ORAL_CAPSULE | Freq: Every day | ORAL | Status: DC
  Administered 2019-08-21 – 2019-08-24 (×4): 200 mg via ORAL
  Filled 2019-08-20 (×5): qty 2

## 2019-08-20 MED ORDER — HEPARIN BOLUS VIA INFUSION
4000.0000 [IU] | Freq: Once | INTRAVENOUS | Status: AC
  Administered 2019-08-20: 4000 [IU] via INTRAVENOUS

## 2019-08-20 MED ORDER — CHLORHEXIDINE GLUCONATE CLOTH 2 % EX PADS
6.0000 | MEDICATED_PAD | Freq: Every day | CUTANEOUS | Status: DC
  Administered 2019-08-21 – 2019-08-22 (×2): 6 via TOPICAL

## 2019-08-20 MED ORDER — HEPARIN (PORCINE) 25000 UT/250ML-% IV SOLN
2400.0000 [IU]/h | INTRAVENOUS | Status: DC
  Administered 2019-08-20: 1700 [IU]/h via INTRAVENOUS
  Administered 2019-08-21: 2300 [IU]/h via INTRAVENOUS
  Administered 2019-08-21: 2000 [IU]/h via INTRAVENOUS
  Administered 2019-08-22: 2300 [IU]/h via INTRAVENOUS
  Administered 2019-08-22 – 2019-08-23 (×2): 2400 [IU]/h via INTRAVENOUS
  Filled 2019-08-20 (×6): qty 250

## 2019-08-20 MED ORDER — ONDANSETRON HCL 4 MG/2ML IJ SOLN: 4.0000 mg | Freq: Four times a day (QID) | INTRAMUSCULAR | Status: DC | PRN

## 2019-08-20 NOTE — H&P (Signed)
History and Physical  [name] [id number] DOB: 18-Dec-1945 DOA: 08/20/2019   PCP: Rutherford Guys, MD   Patient coming from: Home  Chief Complaint: chest pain/sob  HPI:  [name] is a 73 y.o. male with medical history of coronary disease, hypertension, hyperlipidemia, anxiety, GI bleed presenting with 5-day history of substernal and left-sided chest pain.  The patient has also had an associated cough with yellow sputum.  He is also developed some blood-tinged sputum in the past 2 to 3 days.  On 08/19/2019, the patient developed worsening shortness of breath that has progressively worsened.  As result, he presented emergency department for the evaluation.  He had subjective fevers and chills, but when he took his temperature he was afebrile at home.  He states that his chest pain is worse with inspiration and coughing as well as with laying flat.  He states that his lower extremity edema has been about the same as usual.  He denies any orthopnea or PND type symptoms.  He has been compliant with all his medications.  He denies any recent travels or long trips.  He denies any nausea, vomiting, diarrhea, abdominal pain, hematochezia, melena. In the emergency department, the patient was afebrile and hemodynamically stable with sinus tachycardia in the 100s.  Oxygen saturation was initially 87% on room air.  He was placed on 2 L with improvement to 95%.  BMP and LFTs were essentially unremarkable.  WBC was 14.8 with hemoglobin 15.4 and platelets 218,000.  Initial troponin was 775>>> 653.  Lactic acid was 1.5>>> 1.1.  Chest x-ray showed left basilar opacity concerning for atelectasis versus pneumonia.  EKG showed sinus rhythm with nonspecific T wave changes.  BNP was 126.  Assessment/Plan: Acute respiratory failure with hypoxia -Secondary to pneumonia -Also concern about possible underlying PE -Presently stable on 2 L -Wean oxygen as tolerated -CT angiogram chest  Lobar  pneumonia/community-acquired pneumonia -Continue ceftriaxone and azithromycin -Urine Legionella antigen -Urine Streptococcus pneumonia antigen  Chest pain/coronary artery disease/elevated troponin -Suspect demand ischemia -Echocardiogram -Troponin775>>> 653 -Continue to cycle troponins -Cardiology consult -Continue aspirin and Plavix -04/15/2009 heart catheterization--two-vessel coronary artery disease.  BMS x2 to the RCA.  Essential hypertension -Holding lisinopril and HCTZ -Monitor blood pressure off antihypertensive medications  Anxiety -Continue home dose of p.o. alprazolam  Hyperlipidemia -Continue statin       Past Medical History:  Diagnosis Date  . Arthritis   . CAD S/P percutaneous coronary angioplasty 2011   stents x 3  . Generalized anxiety disorder   . Hard of hearing   . Hyperlipidemia   . Hypertension   . Lower GI bleed 2014   colonscopy was normal  . 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 CORONARY ANGIOPLASTY WITH STENT PLACEMENT    . FRACTURE SURGERY  1995   right elbow  . KNEE ARTHROSCOPY W/ MENISCAL REPAIR     Social History:  reports that he quit smoking about 50 years ago. His smoking use included cigarettes. He started smoking about 53 years ago. He smoked 0.30 packs per day. He has never used smokeless tobacco. He reports current alcohol use of about 3.0 standard drinks of alcohol per week. He reports that he does not use drugs.   Family History  Problem Relation Age of Onset  . Heart disease Mother 39       heart attack  . Lung disease Father  asbestosis  . Asthma Daughter   . Hyperlipidemia Daughter   . Alcohol abuse Paternal Uncle   . Cancer Paternal Grandfather        lung     No Known Allergies   Prior to Admission medications   Medication Sig Start Date End Date Taking? Authorizing Provider  ALPRAZolam (XANAX) 0.25 MG tablet Take 1 tablet (0.25 mg total) by  mouth at bedtime as needed for anxiety. 04/18/19  Yes Rutherford Guys, MD  aspirin EC 81 MG tablet Take 81 mg by mouth daily.   Yes [provider]  clopidogrel (PLAVIX) 75 MG tablet Take 1 tablet (75 mg total) by mouth daily. 10/12/18  Yes Rutherford Guys, MD  docusate sodium (COLACE) 100 MG capsule Take 200 mg by mouth daily.    Yes [provider]  hydrochlorothiazide (MICROZIDE) 12.5 MG capsule Take 1 capsule (12.5 mg total) by mouth daily. 10/12/18  Yes Rutherford Guys, MD  lisinopril (PRINIVIL,ZESTRIL) 20 MG tablet Take 1 tablet (20 mg total) by mouth daily. 10/12/18  Yes Rutherford Guys, MD  rosuvastatin (CRESTOR) 20 MG tablet Take 1 tablet (20 mg total) by mouth daily. 10/12/18  Yes Rutherford Guys, MD  diphenhydrAMINE (BENADRYL) 25 MG tablet Take 25 mg by mouth as needed.    [provider]    Review of Systems:  Constitutional:  No weight loss, night sweats, Fevers, chills, fatigue.  Head&Eyes: No headache.  No vision loss.  No eye pain or scotoma ENT:  No Difficulty swallowing,Tooth/dental problems,Sore throat,  No ear ache, post nasal drip,  Cardio-vascular:  No  Orthopnea, PND, swelling in lower extremities,  dizziness, palpitations  GI:  No  abdominal pain, nausea, vomiting, diarrhea, loss of appetite, hematochezia, melena, heartburn, indigestion, Resp:  .No wheezing.No chest wall deformity  Skin:  no rash or lesions.  GU:  no dysuria, change in color of urine, no urgency or frequency. No flank pain.  Musculoskeletal:  No joint pain or swelling. No decreased range of motion. No back pain.  Psych:  No change in mood or affect. No depression or anxiety. Neurologic: No headache, no dysesthesia, no focal weakness, no vision loss. No syncope  Physical Exam: Vitals:   08/20/19 0951 08/20/19 1012 08/20/19 1030 08/20/19 1146  BP:   (!) 116/93   Pulse:   (!) 106   Resp:   (!) 21   Temp:    98.5 F (36.9 C)  TempSrc:    Oral  SpO2: (!) 87%  96%    Weight:  127 kg    Height:  6\' 2"  (1.88 m)     General:  A&O x 3, NAD, nontoxic, pleasant/cooperative Head/Eye: No conjunctival hemorrhage, no icterus, [place]/AT, No nystagmus ENT:  No icterus,  No thrush, good dentition, no pharyngeal exudate Neck:  No masses, no lymphadenpathy, no bruits CV:  RRR, no rub, no gallop, no S3 Lung:  Bibasilar crackles L>R Abdomen: soft/NT, +BS, nondistended, no peritoneal signs Ext: No cyanosis, No rashes, No petechiae, No lymphangitis, 2+LE edema Neuro: CNII-XII intact, strength 4/5 in bilateral upper and lower extremities, no dysmetria  Labs on Admission:  Basic Metabolic Panel: Recent Labs  Lab 08/20/19 1210  NA 138  K 4.0  CL 100  CO2 27  GLUCOSE 119*  BUN 27*  CREATININE 1.07  CALCIUM 9.4   Liver Function Tests: Recent Labs  Lab 08/20/19 1210  AST 26  ALT 30  ALKPHOS 74  BILITOT 1.2  PROT 7.8  ALBUMIN 3.8   No results for input(s): LIPASE, AMYLASE in the last 168 hours. No results for input(s): AMMONIA in the last 168 hours. CBC: Recent Labs  Lab 08/20/19 1210  WBC 14.3*  NEUTROABS 11.8*  HGB 15.4  HCT 48.6  MCV 90.8  PLT 218   Coagulation Profile: Recent Labs  Lab 08/20/19 1210  INR 1.1   Cardiac Enzymes: No results for input(s): CKTOTAL, CKMB, CKMBINDEX, TROPONINI in the last 168 hours. BNP: Invalid input(s): POCBNP CBG: No results for input(s): GLUCAP in the last 168 hours. Urine analysis:    Component Value Date/Time   COLORURINE DARK YELLOW 11/21/2017 0814   APPEARANCEUR CLOUDY (A) 11/21/2017 0814   LABSPEC 1.025 11/21/2017 0814   PHURINE 5.5 11/21/2017 0814   GLUCOSEU NEGATIVE 11/21/2017 0814   HGBUR NEGATIVE 11/21/2017 0814   KETONESUR NEGATIVE 11/21/2017 0814   PROTEINUR NEGATIVE 11/21/2017 0814   NITRITE NEGATIVE 11/21/2017 0814   LEUKOCYTESUR NEGATIVE 11/21/2017 0814   Sepsis Labs: @LABRCNTIP (procalcitonin:4,lacticidven:4) ) Recent Results (from the past 240 hour(s))  Blood Culture (routine x  2)     Status: None (Preliminary result)   Collection Time: 08/20/19 12:10 PM   Specimen: BLOOD LEFT ARM  Result Value Ref Range Status   Specimen Description BLOOD LEFT ARM  Final   Special Requests   Final    BOTTLES DRAWN AEROBIC AND ANAEROBIC Blood Culture adequate volume Performed at [facility], [address], [place], [place] [address]    Culture PENDING  Incomplete   Report Status PENDING  Incomplete  Blood Culture (routine x 2)     Status: None (Preliminary result)   Collection Time: 08/20/19 12:25 PM   Specimen: BLOOD LEFT HAND  Result Value Ref Range Status   Specimen Description BLOOD LEFT HAND  Final   Special Requests   Final    BOTTLES DRAWN AEROBIC AND ANAEROBIC Blood Culture results may not be optimal due to an inadequate volume of blood received in culture bottles Performed at [facility], [address], [place], [place] [address]    Culture PENDING  Incomplete   Report Status PENDING  Incomplete     Radiological Exams on Admission: Dg Chest Port 1 View  Result Date: 08/20/2019 CLINICAL DATA:  Cough, left sided chest pain. Additional history provided: History of 3 cardiac stents. Weakness, coughing with blood tinged sputum. EXAM: PORTABLE CHEST 1 VIEW COMPARISON:  No pertinent prior studies available for comparison. FINDINGS: The left heart border is partially obscured, limiting evaluation of heart size. Left basilar opacity consistent with atelectasis and/or pneumonia as well as left pleural effusion. Mild linear atelectasis within the right lung base. No evidence of pneumothorax. No acute bony abnormality. IMPRESSION: Atelectasis and/or pneumonia as well as left pleural effusion at the left lung base. Electronically Signed   By: Kellie Simmering DO   On: 08/20/2019 12:54    EKG: Independently reviewed. Sinus, nonspecific T wave changes    Time spent:60 minutes Code Status:   FULL Family Communication:  Spouse updated at bedside 11/10 Disposition Plan: expect  2-3 day hospitalization Consults called: cardiology DVT Prophylaxis: [place] Lovenox  Orson Eva, DO  [facility] Pager [phone]  If 7PM-7AM, please contact night-coverage [email or web address] Password [email or web address] 08/20/2019, 3:02 PM

## 2019-08-20 NOTE — H&P (Signed)
NAME:  [name], MRN:  [id number], DOB:  28-Nov-1945, LOS: 0 ADMISSION DATE:  08/20/2019, CONSULTATION DATE:  11/10 REFERRING MD:  Dr, Tat, CHIEF COMPLAINT:  Acute respiratory distress, PE   Brief History   73 yo male presented with 5 day history of chest pain, workup on admission with revealed large saddle PE with extension in lobar branches. PCCM consulted for admission and possible need for ECOS.    History of present illness   73yo male with history of CAD, hyperlipidemia, hypertension, and malignant melanoma of back presented with complaints of substernal and left sided chest that radiated to back pain which began 5 days prior to admission. Patient originally thought chest and back pain may be related to pulled muscle but when pain persisted and he developed worsening shortness of breath he presented to the ED. Patient also had associated cough and yellow sputum. Chest pain was described as worse on inspiration and cough as well as lying flat.   On admission patient was seen afebrile and hemodynamically stable with mild hypoxia of 87% on room air. CXR with questionable opacity concerning for atelectasis vs pneumonia. TRH at [facility] was consulted and patient was admitted for CAP.   After admission primary attending made discission to obtain CT angio which revealed saddle pulmonary embolism with evidence of right heart strain. Given tachycardia, persistent hypoxia with need of supplemental oxygen, and evidence of RV strain PCCM consulted for possible need for EKOS. Decision made to transfer patient to [facility] for stat echocardiogram to further determine need for thrombolytics.   Past Medical History  Hypertension, Hyperlipidemia, CAD S/P PCI, Arthritis, Malignant melanoma of back, Lower GI bleed   Significant Hospital Events   11/10 Admitted   Consults:  PCCM IR  Procedures:    Significant Diagnostic Tests:  CT angio chest  11/10 >> Large saddle pulmonary embolism with extension into  the lobar branches bilaterally. There is CT evidence of right-sided heart strain with the RV/LV ratio measuring approximately 1.36. Ground-glass airspace opacity in the left lower lobe favored to represent an evolving pulmonary infarct in the setting of extensive acute pulmonary emboli. An underlying infectious process is not entirely excluded. Bilateral pulmonary nodules measuring up to 6 mm.  Micro Data:  COVID 11/10 >> Blood cultures 11/10 >>  Antimicrobials:  Azithromycin 11/10 x1 Rocephin 11/10 x1   Interim history/subjective:  Pain somewhat improved from admission, but still pretty severe.   Objective   Blood pressure (!) 130/92, pulse (!) 111, temperature 98.5 F (36.9 C), temperature source Oral, resp. rate (!) 22, height 6\' 2"  (1.88 m), weight 127 kg, SpO2 91 %.       No intake or output data in the 24 hours ending 08/20/19 1808 Filed Weights   08/20/19 1012  Weight: 127 kg    Examination: General: Elderly, overweight male in NAD. HENT: [place]/AT, PERRL, no JVD. Lungs: Clear bilateral breath sounds. Cardiovascular: Tachy, regular, no MRG. Abdomen: Soft, non-tender, non-distended. Extremities: No acute deformity or ROM limitation. Trace bilateral lower extremity edema.  Neuro: Alert, oriented, non-focal. Very hard of hearing. Lifelong.  Resolved Hospital Problem list     Assessment & Plan:   Pulmonary embolism: saddle PE submassive by CT. Hypoxic to 87% on room air.   CAP vs ATX vs infarct: Favor ATX as he is clearly splinting on exam reports the inability to inhale deeply.  -CT angio with large saddle pulmonary embolism with extension into the lobar branches bilaterally. There is CT evidence of right-sided heart  strain with the RV/LV ratio measuring approximately 1.36. P: Continue supplemental oxygen. Keep O2 sats > 92% Echocardiogram IR aware of [name]. Will re-involve them based on his echo result.  Bilateral lower extremity doppler. Heparin drip per pharmacy  Continue CAP coverage antibiotics. PCT pending Gentle IV hydration  Will require at least 6 months anticoagulation.  Toradol PRN for mild/mod pain. Morphine PRN for severe pain.  Incentive spirometry  Pulmonary nodules: bilateral measuring up to 42mm With no clear etiology for PE and his history of melanoma, would consider pan scan when he is more stable.   Elevated troponin: in setting of PE, trending down Hx of CAD -S/P PCI with stent x3 in 2011 -Likely secondary to demand from saddle PE P: Trend troponin Continue home plavix  Essential hypertension  -Home medications include HCTZ, Lisinopril P: Hold home antihypertensives  Monitor BP closely   Hyperlipidemia  P: Resume home statin    Best practice:  Diet: NPO Pain/Anxiety/Delirium protocol (if indicated):  VAP protocol (if indicated): N/A DVT prophylaxis: Heparin drip  GI prophylaxis: PPI Glucose control: monitor  Mobility: BR Code Status: Full Family Communication: Patient updated.  Disposition: ICU   Labs   CBC: Recent Labs  Lab 08/20/19 1210  WBC 14.3*  NEUTROABS 11.8*  HGB 15.4  HCT 48.6  MCV 90.8  PLT 99991111    Basic Metabolic Panel: Recent Labs  Lab 08/20/19 1210  NA 138  K 4.0  CL 100  CO2 27  GLUCOSE 119*  BUN 27*  CREATININE 1.07  CALCIUM 9.4   GFR: Estimated Creatinine Clearance: 89.7 mL/min (by C-G formula based on SCr of 1.07 mg/dL). Recent Labs  Lab 08/20/19 1210 08/20/19 1314  WBC 14.3*  --   LATICACIDVEN 1.5 1.1    Liver Function Tests: Recent Labs  Lab 08/20/19 1210  AST 26  ALT 30  ALKPHOS 74  BILITOT 1.2  PROT 7.8  ALBUMIN 3.8   No results for input(s): LIPASE, AMYLASE in the last 168 hours. No results for input(s): AMMONIA in the last 168 hours.  ABG No results found for: PHART, PCO2ART, PO2ART, HCO3, TCO2, ACIDBASEDEF, O2SAT   Coagulation Profile: Recent Labs  Lab 08/20/19 1210  INR 1.1    Cardiac Enzymes: No results for input(s): CKTOTAL, CKMB,  CKMBINDEX, TROPONINI in the last 168 hours.  HbA1C: No results found for: HGBA1C  CBG: No results for input(s): GLUCAP in the last 168 hours.  Review of Systems:   Bolds are positive  Constitutional: weight loss, gain, night sweats, Fevers, chills, fatigue .  HEENT: headaches, Sore throat, sneezing, nasal congestion, post nasal drip, Difficulty swallowing, Tooth/dental problems, visual complaints visual changes, ear ache CV:  chest pain, radiates:,Orthopnea, PND, swelling in lower extremities (chronic), dizziness, palpitations, syncope.  GI  heartburn, indigestion, abdominal pain, nausea, vomiting, diarrhea, change in bowel habits, loss of appetite, bloody stools.  Resp: cough, productive:, hemoptysis, dyspnea, chest pain, pleuritic. Left sided Skin: rash or itching or icterus GU: dysuria, change in color of urine, urgency or frequency. flank pain, hematuria  MS: joint pain or swelling. decreased range of motion  Psych: change in mood or affect. depression or anxiety.  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   Surgical History    Past Surgical  History:  Procedure Laterality Date  . CORONARY ANGIOPLASTY WITH STENT PLACEMENT    . FRACTURE SURGERY  1995   right elbow  . KNEE ARTHROSCOPY W/ MENISCAL REPAIR       Social History   reports that he quit smoking about 50 years ago. His smoking use included cigarettes. He started smoking about 53 years ago. He smoked 0.30 packs per day. He has never used smokeless tobacco. He reports current alcohol use of about 3.0 standard drinks of alcohol per week. He reports that he does not use drugs.   Family History   His family history includes Alcohol abuse in  his paternal uncle; Asthma in his daughter; Cancer in his paternal grandfather; Heart disease (age of onset: 50) in his mother; Hyperlipidemia in his daughter; Lung disease in his father.   Allergies No Known Allergies   Home Medications  Prior to Admission medications   Medication Sig Start Date End Date Taking? Authorizing Provider  ALPRAZolam (XANAX) 0.25 MG tablet Take 1 tablet (0.25 mg total) by mouth at bedtime as needed for anxiety. 04/18/19  Yes Rutherford Guys, MD  aspirin EC 81 MG tablet Take 81 mg by mouth daily.   Yes [provider]  clopidogrel (PLAVIX) 75 MG tablet Take 1 tablet (75 mg total) by mouth daily. 10/12/18  Yes Rutherford Guys, MD  docusate sodium (COLACE) 100 MG capsule Take 200 mg by mouth daily.    Yes [provider]  hydrochlorothiazide (MICROZIDE) 12.5 MG capsule Take 1 capsule (12.5 mg total) by mouth daily. 10/12/18  Yes Rutherford Guys, MD  lisinopril (PRINIVIL,ZESTRIL) 20 MG tablet Take 1 tablet (20 mg total) by mouth daily. 10/12/18  Yes Rutherford Guys, MD  rosuvastatin (CRESTOR) 20 MG tablet Take 1 tablet (20 mg total) by mouth daily. 10/12/18  Yes Rutherford Guys, MD  diphenhydrAMINE (BENADRYL) 25 MG tablet Take 25 mg by mouth as needed.    [provider]      Georgann Housekeeper, AGACNP-BC [place] Pager [phone] or [phone]  08/20/2019 10:00 PM

## 2019-08-20 NOTE — ED Provider Notes (Signed)
[facility] EMERGENCY DEPARTMENT Provider Note   CSN: [id number] Arrival date & time: 08/20/19  [id number]     History   Chief Complaint Chief Complaint  Patient presents with   Chest Pain    HPI [name] is a 73 y.o. male.     HPI  73 year old male with left chest and back pain.  Has been ongoing for about 5 days or so.  Mostly was the pain that he thought he might of pulled a muscle.  It is constant.  However he is also had a cough with some sputum with trace bits of blood.  No fevers.  Chronic leg swelling that is pretty unchanged.  Last night and today has become short of breath.  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 Authorizing Provider  ALPRAZolam (XANAX) 0.25 MG tablet Take 1 tablet (0.25 mg total) by mouth at bedtime as needed for anxiety. 04/18/19   Yes Rutherford Guys, MD  aspirin EC 81 MG tablet Take 81 mg by mouth daily.   Yes [provider]  clopidogrel (PLAVIX) 75 MG tablet Take 1 tablet (75 mg total) by mouth daily. 10/12/18  Yes Rutherford Guys, MD  docusate sodium (COLACE) 100 MG capsule Take 200 mg by mouth daily.    Yes [provider]  hydrochlorothiazide (MICROZIDE) 12.5 MG capsule Take 1 capsule (12.5 mg total) by mouth daily. 10/12/18  Yes Rutherford Guys, MD  lisinopril (PRINIVIL,ZESTRIL) 20 MG tablet Take 1 tablet (20 mg total) by mouth daily. 10/12/18  Yes Rutherford Guys, MD  rosuvastatin (CRESTOR) 20 MG tablet Take 1 tablet (20 mg total) by mouth daily. 10/12/18  Yes Rutherford Guys, MD  diphenhydrAMINE (BENADRYL) 25 MG tablet Take 25 mg by mouth as needed.    [provider]    Family History Family History  Problem Relation Age of Onset   Heart disease Mother 83       heart attack   Lung disease Father        asbestosis   Asthma Daughter    Hyperlipidemia Daughter    Alcohol abuse Paternal Uncle    Cancer Paternal Grandfather        lung    Social History  Social History   Tobacco Use   Smoking status: Former Smoker    Packs/day: 0.30    Types: Cigarettes    Start date: 08/31/1966    Quit date: 08/31/1969    Years since quitting: 50.0   Smokeless tobacco: Never Used  Substance Use Topics   Alcohol use: Yes    Alcohol/week: 3.0 standard drinks    Types: 2 Cans of beer, 1 Shots of liquor per week   Drug use: No     Allergies   Patient has no known allergies.   Review of Systems Review of Systems  Constitutional: Negative for fever.  Respiratory: Positive for cough and shortness of breath.   Cardiovascular: Positive for chest pain and leg swelling (chronic, unchanged).  Musculoskeletal: Positive for back pain.  All other systems reviewed and are negative.    Physical Exam Updated Vital Signs BP (!) 116/93    Pulse (!) 106    Temp 98.5 F (36.9 C)  (Oral)    Resp (!) 21    Ht 6\' 2"  (1.88 m)    Wt 127 kg    SpO2 96%    BMI 35.95 kg/m   Physical Exam Vitals signs and nursing note reviewed.  Constitutional:      General: He is not in acute distress.    Appearance: He is well-developed. He is obese. He is not ill-appearing or diaphoretic.  HENT:     Head: Normocephalic and atraumatic.     Right Ear: External ear normal.     Left Ear: External ear normal.     Nose: Nose normal.  Eyes:     General:        Right eye: No discharge.        Left eye: No discharge.  Neck:     Musculoskeletal: Neck supple.  Cardiovascular:     Rate and Rhythm: Normal rate and regular rhythm.     Heart sounds: Normal heart sounds.  Pulmonary:     Effort: Pulmonary effort is normal.     Breath sounds: Examination of the left-lower field reveals rales. Rales present.  Chest:     Chest wall: No tenderness.  Abdominal:     Palpations: Abdomen is soft.     Tenderness: There is no abdominal tenderness.  Musculoskeletal:     Right lower leg: Edema present.     Left lower leg: Edema present.  Skin:    General: Skin is warm and dry.  Neurological:     Mental Status: He is alert.  Psychiatric:        Mood and Affect: Mood is not anxious.      ED Treatments / Results  Labs (all labs ordered are listed, but only abnormal results are displayed) Labs Reviewed  COMPREHENSIVE METABOLIC PANEL - Abnormal; Notable for the following components:      Result Value   Glucose, Bld 119 (*)    BUN 27 (*)    All other components within normal limits  CBC WITH DIFFERENTIAL/PLATELET - Abnormal; Notable for the following components:   WBC 14.3 (*)    Neutro Abs 11.8 (*)    Monocytes Absolute 1.6 (*)    All other components within normal limits  BRAIN NATRIURETIC PEPTIDE - Abnormal; Notable for the following components:   B Natriuretic Peptide 126.0 (*)    All other components within normal limits  TROPONIN I (HIGH SENSITIVITY) - Abnormal; Notable for the  following components:   Troponin I (High Sensitivity) 775 (*)  All other components within normal limits  TROPONIN I (HIGH SENSITIVITY) - Abnormal; Notable for the following components:   Troponin I (High Sensitivity) 653 (*)    All other components within normal limits  CULTURE, BLOOD (ROUTINE X 2)  CULTURE, BLOOD (ROUTINE X 2)  SARS CORONAVIRUS 2 (TAT 6-24 HRS)  LACTIC ACID, PLASMA  LACTIC ACID, PLASMA  APTT  PROTIME-INR    EKG EKG Interpretation  Date/Time:  Tuesday August 20 2019 10:30:40 EST Ventricular Rate:  105 PR Interval:    QRS Duration: 111 QT Interval:  383 QTC Calculation: 507 R Axis:   -36 Text Interpretation: Sinus tachycardia Atrial premature complex Left axis deviation Low voltage, precordial leads RSR' in V1 or V2, probably normal variant Prolonged QT interval Baseline wander in lead(s) V3 No old tracing to compare Confirmed by Sherwood Gambler [phone]) on 08/20/2019 10:41:31 AM   Radiology Dg Chest Port 1 View  Result Date: 08/20/2019 CLINICAL DATA:  Cough, left sided chest pain. Additional history provided: History of 3 cardiac stents. Weakness, coughing with blood tinged sputum. EXAM: PORTABLE CHEST 1 VIEW COMPARISON:  No pertinent prior studies available for comparison. FINDINGS: The left heart border is partially obscured, limiting evaluation of heart size. Left basilar opacity consistent with atelectasis and/or pneumonia as well as left pleural effusion. Mild linear atelectasis within the right lung base. No evidence of pneumothorax. No acute bony abnormality. IMPRESSION: Atelectasis and/or pneumonia as well as left pleural effusion at the left lung base. Electronically Signed   By: Kellie Simmering DO   On: 08/20/2019 12:54    Procedures .Critical Care Performed by: Sherwood Gambler, MD Authorized by: Sherwood Gambler, MD   Critical care provider statement:    Critical care time (minutes):  35   Critical care time was exclusive of:  Separately billable  procedures and treating other patients   Critical care was necessary to treat or prevent imminent or life-threatening deterioration of the following conditions:  Respiratory failure   Critical care was time spent personally by me on the following activities:  Discussions with consultants, evaluation of patient's response to treatment, examination of patient, ordering and performing treatments and interventions, ordering and review of laboratory studies, ordering and review of radiographic studies, pulse oximetry, re-evaluation of patient's condition, obtaining history from patient or surrogate and review of old charts   (including critical care time)  Medications Ordered in ED Medications  azithromycin (ZITHROMAX) 500 mg in sodium chloride 0.9 % 250 mL IVPB (500 mg Intravenous New Bag/Given 08/20/19 1436)  HYDROmorphone (DILAUDID) injection 1 mg (1 mg Intravenous Given 08/20/19 1213)  cefTRIAXone (ROCEPHIN) 1 g in sodium chloride 0.9 % 100 mL IVPB (0 g Intravenous Stopped 08/20/19 1407)     Initial Impression / Assessment and Plan / ED Course  I have reviewed the triage vital signs and the nursing notes.  Pertinent labs & imaging results that were available during my care of the patient were reviewed by me and considered in my medical decision making (see chart for details).        Patient is mildly hypoxic (~86%), comes up with O2. Appears to have pneumonia. Given Rocephin, azithro. Troponin elevation is flat/downtrending, probably response to pneumonia rather than Type 1 MI. Admit to hospitalist service.   Final Clinical Impressions(s) / ED Diagnoses   Final diagnoses:  Community acquired pneumonia of left lower lobe of lung  Acute respiratory failure with hypoxia [facility])    ED Discharge Orders    None  Sherwood Gambler, MD 08/20/19 [phone]

## 2019-08-20 NOTE — ED Notes (Signed)
Date and time results received: 08/20/19 1419 (use smartphrase ".now" to insert current time)  Test: Troponin Critical Value:   Name of Provider Notified: [id number]  Orders Received? Or Actions Taken?: new orders

## 2019-08-20 NOTE — ED Triage Notes (Signed)
Pt brought in by [facility] with c/o left sided chest pain. Hx of 3 cardiac stents. Lungs clear per EMS. Pt reports weakness every time he stands. O2 was 87% on RA. EMS placed pt on 4L of O2 and sat increased to 91%. EKG with no ST elevation for EMS. IV 18g in rt a/c. BP 130/90. 324ASA given PTA by wife. EMS reports pt was coughing sputum with blood tinge PTA.

## 2019-08-20 NOTE — Progress Notes (Signed)
ANTICOAGULATION CONSULT NOTE - Initial Consult  Pharmacy Consult for heparin gtt  Indication: pulmonary embolus  No Known Allergies  Patient Measurements: Height: 6\' 2"  (188 cm) Weight: 280 lb (127 kg) IBW/kg (Calculated) : 82.2 Heparin Dosing Weight: HEPARIN DW (KG): 110   Vital Signs: Temp: 98.5 F (36.9 C) (11/10 1146) Temp Source: Oral (11/10 1146) BP: 116/93 (11/10 1030) Pulse Rate: 106 (11/10 1030)  Labs: Recent Labs    08/20/19 1210  HGB 15.4  HCT 48.6  PLT 218  APTT 33  LABPROT 14.0  INR 1.1  CREATININE 1.07    Estimated Creatinine Clearance: 89.7 mL/min (by C-G formula based on SCr of 1.07 mg/dL).   Medical History: Past Medical History:  Diagnosis Date  . Arthritis   . CAD S/P percutaneous coronary angioplasty 2011   stents x 3  . Generalized anxiety disorder   . Hard of hearing   . Hyperlipidemia   . Hypertension   . Lower GI bleed 2014   colonscopy was normal  . 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 heparin  4,000 Units Intravenous Once  .  HYDROmorphone (DILAUDID) injection  0.5 mg Intravenous Once   Infusions:  . heparin     PRN:  Anti-infectives (From admission, onward)   Start     Dose/Rate Route Frequency Ordered Stop   08/20/19 1300  cefTRIAXone (ROCEPHIN) 1 g in sodium chloride 0.9 % 100 mL IVPB     1 g 200 mL/hr over 30 Minutes Intravenous  Once 08/20/19 1257 08/20/19 1407   08/20/19 1300  azithromycin (ZITHROMAX) 500 mg in sodium chloride 0.9 % 250 mL IVPB     500 mg 250 mL/hr over 60 Minutes Intravenous  Once 08/20/19 1257 08/20/19 1540      Assessment: Mazon Smedley a 73 y.o. male requires anticoagulation with a heparin iv infusion for the indication of  pulmonary embolus. Heparin gtt will be started following pharmacy protocol per pharmacy consult. Patient is not on previous oral anticoagulant that will require aPTT/HL correlation before transitioning  to only HL monitoring.   Goal of Therapy:  Heparin level 0.3-0.7 units/ml Monitor platelets by anticoagulation protocol: Yes   Plan:  Give 4000 units bolus x 1 Start heparin infusion at 1700 units/hr Check anti-Xa level in 8 hours and daily while on heparin Continue to monitor H&H and platelets  Heparin level to be drawn in 8 hours for patients >38 years old  Donna Christen Shevy Yaney 08/20/2019,5:12 PM

## 2019-08-20 NOTE — Progress Notes (Signed)
CRITICAL VALUE ALERT  Critical Value:  Troponin (high sensitivity) 658  Date & Time Notied: 08/20/2019 2255  Provider Notified: Georgann Housekeeper NP

## 2019-08-20 NOTE — Progress Notes (Signed)
I ordered CTA chest after I initially evaluated the patient.  I was called by radiology, Dr. Frederico Hamman pulmonary embolus with RV strain  I went back to re-evaluate patient.  He appears comfortable without any distress or increase WOB.  He remains sob, but less so that when he first arrive.  His only complaint is chest pain.  No hemoptysis since arrival.  VS--HR110--RR16--BP130/92--90-91% on 6L  CV-RRR, no rub Lung--bibasilar rales. 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 Lamonte Sakai -plan to transfer to [facility] for stat echo and possible catheter directed tPA   DTat   Total time spent 65 minutes in addition to the 60 minutes spent earlier.  Greater than 50% spent face to face counseling and coordinating care. The patient is critically ill with multiple organ systems failure and requires high complexity decision making for assessment and support, frequent evaluation and titration of therapies, application of advanced monitoring technologies and extensive interpretation of multiple databases.  Critical care time - 40 mins.

## 2019-08-20 NOTE — ED Notes (Addendum)
Care Link at bedside 

## 2019-08-21 ENCOUNTER — Inpatient Hospital Stay (HOSPITAL_COMMUNITY): Payer: Medicare Other

## 2019-08-21 ENCOUNTER — Encounter (HOSPITAL_COMMUNITY): Payer: Self-pay | Admitting: Pulmonary Disease

## 2019-08-21 DIAGNOSIS — I2602 Saddle embolus of pulmonary artery with acute cor pulmonale: Secondary | ICD-10-CM

## 2019-08-21 DIAGNOSIS — J9601 Acute respiratory failure with hypoxia: Secondary | ICD-10-CM

## 2019-08-21 DIAGNOSIS — I2692 Saddle embolus of pulmonary artery without acute cor pulmonale: Secondary | ICD-10-CM

## 2019-08-21 DIAGNOSIS — R739 Hyperglycemia, unspecified: Secondary | ICD-10-CM

## 2019-08-21 LAB — BASIC METABOLIC PANEL
Anion gap: 10 (ref 5–15)
BUN: 30 mg/dL — ABNORMAL HIGH (ref 8–23)
CO2: 25 mmol/L (ref 22–32)
Calcium: 9.1 mg/dL (ref 8.9–10.3)
Chloride: 102 mmol/L (ref 98–111)
Creatinine, Ser: 1.07 mg/dL (ref 0.61–1.24)
GFR calc Af Amer: >60 mL/min (ref >60)
GFR calc non Af Amer: >60 mL/min (ref >60)
Glucose, Bld: 128 mg/dL — ABNORMAL HIGH (ref 70–99)
Potassium: 3.2 mmol/L — ABNORMAL LOW (ref 3.5–5.1)
Sodium: 137 mmol/L (ref 135–145)

## 2019-08-21 LAB — CBC
HCT: 43 % (ref 39.0–52.0)
Hemoglobin: 14 g/dL (ref 13.0–17.0)
MCH: 28.9 pg (ref 26.0–34.0)
MCHC: 32.6 g/dL (ref 30.0–36.0)
MCV: 88.7 fL (ref 80.0–100.0)
Platelets: 215 10*3/uL (ref 150–400)
RBC: 4.85 MIL/uL (ref 4.22–5.81)
RDW: 14.3 % (ref 11.5–15.5)
WBC: 13.1 10*3/uL — ABNORMAL HIGH (ref 4.0–10.5)
nRBC: 0 % (ref 0.0–0.2)

## 2019-08-21 LAB — PHOSPHORUS: Phosphorus: 3.1 mg/dL (ref 2.5–4.6)

## 2019-08-21 LAB — HEPARIN LEVEL (UNFRACTIONATED)
Heparin Unfractionated: 0.23 IU/mL — ABNORMAL LOW (ref 0.30–0.70)
Heparin Unfractionated: 0.28 IU/mL — ABNORMAL LOW (ref 0.30–0.70)
Heparin Unfractionated: 0.39 IU/mL (ref 0.30–0.70)

## 2019-08-21 LAB — STREP PNEUMONIAE URINARY ANTIGEN: Strep Pneumo Urinary Antigen: NEGATIVE

## 2019-08-21 LAB — TROPONIN I (HIGH SENSITIVITY)
Troponin I (High Sensitivity): 316 ng/L (ref <18)
Troponin I (High Sensitivity): 347 ng/L (ref <18)
Troponin I (High Sensitivity): 701 ng/L (ref <18)

## 2019-08-21 LAB — ECHOCARDIOGRAM COMPLETE
Height: 74 in
Weight: 4634.95 oz

## 2019-08-21 LAB — MAGNESIUM: Magnesium: 2.6 mg/dL — ABNORMAL HIGH (ref 1.7–2.4)

## 2019-08-21 LAB — MRSA PCR SCREENING: MRSA by PCR: NEGATIVE

## 2019-08-21 MED ORDER — ORAL CARE MOUTH RINSE
15.0000 mL | Freq: Two times a day (BID) | OROMUCOSAL | Status: DC
  Administered 2019-08-21 – 2019-08-22 (×3): 15 mL via OROMUCOSAL

## 2019-08-21 MED ORDER — HEPARIN BOLUS VIA INFUSION
1700.0000 [IU] | Freq: Once | INTRAVENOUS | Status: AC
  Administered 2019-08-21: 1700 [IU] via INTRAVENOUS
  Filled 2019-08-21: qty 1700

## 2019-08-21 MED ORDER — PERFLUTREN LIPID MICROSPHERE
1.0000 mL | INTRAVENOUS | Status: AC | PRN
  Administered 2019-08-21 (×2): 2 mL via INTRAVENOUS
  Filled 2019-08-21: qty 10

## 2019-08-21 MED ORDER — CALCIUM CARBONATE ANTACID 500 MG PO CHEW
2.0000 | CHEWABLE_TABLET | Freq: Four times a day (QID) | ORAL | Status: DC | PRN
  Administered 2019-08-21: 400 mg via ORAL
  Filled 2019-08-21 (×3): qty 2

## 2019-08-21 MED ORDER — POTASSIUM CHLORIDE CRYS ER 20 MEQ PO TBCR
30.0000 meq | EXTENDED_RELEASE_TABLET | ORAL | Status: AC
  Administered 2019-08-21 (×2): 30 meq via ORAL
  Filled 2019-08-21 (×2): qty 2

## 2019-08-21 MED ORDER — POLYETHYLENE GLYCOL 3350 17 G PO PACK
17.0000 g | PACK | Freq: Every day | ORAL | Status: DC
  Administered 2019-08-21 – 2019-08-24 (×4): 17 g via ORAL
  Filled 2019-08-21 (×4): qty 1

## 2019-08-21 MED ORDER — HEPARIN BOLUS VIA INFUSION
3000.0000 [IU] | Freq: Once | INTRAVENOUS | Status: AC
  Administered 2019-08-21: 3000 [IU] via INTRAVENOUS
  Filled 2019-08-21: qty 3000

## 2019-08-21 NOTE — Progress Notes (Signed)
ANTICOAGULATION CONSULT NOTE   Pharmacy Consult for Heparin  Indication: pulmonary embolus  No Known Allergies  Patient Measurements: Height: 6\' 2"  (188 cm) Weight: 289 lb 11 oz (131.4 kg) IBW/kg (Calculated) : 82.2 Heparin Dosing Weight: HEPARIN DW (KG): 111.3   Vital Signs: Temp: 97.5 F (36.4 C) (11/11 1127) Temp Source: Oral (11/11 1127) BP: 116/85 (11/11 1325) Pulse Rate: 94 (11/11 1325)  Labs: Recent Labs    08/20/19 1210 08/21/19 0242 08/21/19 1246  HGB 15.4 14.0  --   HCT 48.6 43.0  --   PLT 218 215  --   APTT 33  --   --   LABPROT 14.0  --   --   INR 1.1  --   --   HEPARINUNFRC  --  0.23* 0.28*  CREATININE 1.07 1.07  --     Estimated Creatinine Clearance: 91.3 mL/min (by C-G formula based on SCr of 1.07 mg/dL).   Medical History: Past Medical History:  Diagnosis Date  . Arthritis   . CAD S/P percutaneous coronary angioplasty 2011   stents x 3  . Generalized anxiety disorder   . Hard of hearing   . Hyperlipidemia   . Hypertension   . Lower GI bleed 2014   colonscopy was normal  . 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 ALPRAZolam (XANAX) 0.25 MG tablet Take 1 tablet (0.25 mg total) by mouth at bedtime as needed for anxiety. 30 tablet 0 08/17/2019  . aspirin EC 81 MG tablet Take 81 mg by mouth daily.   08/20/2019 at 0900am  . clopidogrel (PLAVIX) 75 MG tablet Take 1 tablet (75 mg total) by mouth daily. 90 tablet 3 08/20/2019 at Unknown time  . docusate sodium (COLACE) 100 MG capsule Take 200 mg by mouth daily.    08/19/2019 at Unknown time  . hydrochlorothiazide (MICROZIDE) 12.5 MG capsule Take 1 capsule (12.5 mg total) by mouth daily. 90 capsule 3 08/20/2019 at Unknown time  . lisinopril (PRINIVIL,ZESTRIL) 20 MG tablet Take 1 tablet (20 mg total) by mouth daily. 90 tablet 3 08/20/2019 at Unknown time  . rosuvastatin (CRESTOR) 20 MG tablet Take 1 tablet  (20 mg total) by mouth daily. 90 tablet 3 08/19/2019 at Unknown time  . diphenhydrAMINE (BENADRYL) 25 MG tablet Take 25 mg by mouth as needed.   unknown   Scheduled:  . aspirin EC  81 mg Oral Daily  . Chlorhexidine Gluconate Cloth  6 each Topical Daily  . clopidogrel  75 mg Oral Daily  . docusate sodium  200 mg Oral Daily  . mouth rinse  15 mL Mouth Rinse BID  . rosuvastatin  20 mg Oral Daily   Infusions:  . heparin 2,000 Units/hr (08/21/19 0358)   PRN:  Anti-infectives (From admission, onward)   Start     Dose/Rate Route Frequency Ordered Stop   08/21/19 1400  azithromycin (ZITHROMAX) 500 mg in sodium chloride 0.9 % 250 mL IVPB  Status:  Discontinued     500 mg 250 mL/hr over 60 Minutes Intravenous Every 24 hours 08/20/19 2122 08/20/19 2157   08/21/19 1300  cefTRIAXone (ROCEPHIN) 1 g in sodium chloride 0.9 % 100 mL IVPB  Status:  Discontinued     1 g 200 mL/hr over 30 Minutes Intravenous Every 24 hours 08/20/19 2122 08/20/19 2157   08/20/19 1300  cefTRIAXone (ROCEPHIN) 1 g in sodium chloride 0.9 % 100 mL IVPB  1 g 200 mL/hr over 30 Minutes Intravenous  Once 08/20/19 1257 08/20/19 1407   08/20/19 1300  azithromycin (ZITHROMAX) 500 mg in sodium chloride 0.9 % 250 mL IVPB     500 mg 250 mL/hr over 60 Minutes Intravenous  Once 08/20/19 1257 08/20/19 1540      Assessment: 73 y/o M on heparin for PE  Initial heparin level is below goal, no issues per RN, CBC ok  Goal of Therapy:  Heparin level 0.3-0.7 units/ml Monitor platelets by anticoagulation protocol: Yes   Plan:  Heparin 1700 units re-bolus Inc heparin drip to 2300 units/hr Re-check heparin level at 2200

## 2019-08-21 NOTE — Progress Notes (Signed)
  Echocardiogram 2D Echocardiogram with Definity has been performed.  Jennette Dubin 08/21/2019, 8:54 AM

## 2019-08-21 NOTE — Progress Notes (Addendum)
NAME:  Kamai Nowicki, MRN:  [id number], DOB:  1946-05-01, LOS: 1 ADMISSION DATE:  08/20/2019, CONSULTATION DATE:  11/10 REFERRING MD:  Dr, Tat, CHIEF COMPLAINT:  Acute respiratory distress, PE   Brief History   73 y/o male presented 11/10 with 5 day history of chest pain worse on inspiration, workup on admission found him to be hypoxic on RA, CTA with revealed large saddle PE with extension in lobar branches. PCCM consulted for admission and possible need for EKOS.    Past Medical History  Hypertension, Hyperlipidemia, CAD S/P PCI, Arthritis, Malignant melanoma of back, Lower GI bleed   Significant Hospital Events   11/10 Admitted with chest pain, PE  Consults:  PCCM IR  Procedures:    Significant Diagnostic Tests:  CT angio chest  11/10 >> Large saddle pulmonary embolism with extension into the lobar branches bilaterally. There is CT evidence of right-sided heart strain with the RV/LV ratio measuring approximately 1.36. Ground-glass airspace opacity in the left lower lobe favored to represent an evolving pulmonary infarct in the setting of extensive acute pulmonary emboli. An underlying infectious process is not entirely excluded. Bilateral pulmonary nodules measuring up to 6 mm.  Micro Data:  COVID 11/10 >> negative Blood cultures 11/10 >>  Antimicrobials:  Azithromycin 11/10 x1 Rocephin 11/10 x1   Interim history/subjective:  Pt reports his chest discomfort is better, breathing is easier.  Reports his father had a history of blood clots. Remains on heparin gtt.   Objective   Blood pressure 120/86, pulse 90, temperature 98.9 F (37.2 C), temperature source Oral, resp. rate (!) 22, height 6\' 2"  (1.88 m), weight 131.4 kg, SpO2 93 %.        Intake/Output Summary (Last 24 hours) at 08/21/2019 0949 Last data filed at 08/21/2019 0900 Gross per 24 hour  Intake 319.4 ml  Output 300 ml  Net 19.4 ml   Filed Weights   08/20/19 1012 08/20/19 2300 08/21/19 0500  Weight: 127  kg 131.4 kg 131.4 kg    Examination: General: elderly male lying in bed in NAD HEENT: MM pink/moist, no jvd, HOH with hearing aides, good dentition, anicteric Neuro: AAOx4, speech clear, MAE, normal strength CV: s1s2 rrr, no m/r/g, radial pulses +2, symmetrical, DP +2 PULM:  Non-labored, lungs bilaterally clear, on 6L GI: soft, bsx4 active  Extremities: warm/dry, trace edema  Skin: no rashes or lesions  Resolved Hospital Problem list     Assessment & Plan:   Pulmonary embolism Saddle PE submassive by CT. Hypoxic to 87% on room air.   CAP vs ATX vs infarct Favor ATX as he is clearly splinting on exam reports the inability to inhale deeply.  -CT angio with large saddle pulmonary embolism with extension into the lobar branches bilaterally. There is CT evidence of right-sided heart strain with the RV/LV ratio measuring approximately 1.36. P: Await ECHO findings ICU monitoring  Continue O2 for sats >92% Keep on bedrest until heparin level therapeutic  Appreciate IR, aware of patients admit Await BLE venous duplex  Continue heparin gtt per pharmacy  IVF  Will require minimum of 6 months anticoagulation if not longer pending work up for nodules, family hx of blood clot in father  PRN toradol for mild/mod pain, morphine for severe  Pulmonary hygiene - IS, mobilize Reviewed risks of bleeding and indications for thrombolytics with patient  Pulmonary Nodules Bilateral measuring up to 57mm.  With no clear etiology for PE and his history of melanoma, would consider pan scan when he is  more stable.  P: Will need outpatient pulmonary follow up for evaluation of pulmonary nodules   Elevated troponin In setting of PE, trending down Hx of CAD -S/P PCI with stent x3 in 2011 -Likely secondary to demand from saddle PE P: Follow troponin  Continue plavix for stent   Essential hypertension  -Home medications include HCTZ, Lisinopril P: Hold home antihypertensives  Follow hemodynamics  in ICU   Hyperlipidemia  P: Continue statin    Best practice:  Diet: NPO Pain/Anxiety/Delirium protocol (if indicated):  VAP protocol (if indicated): N/A DVT prophylaxis: Heparin drip  GI prophylaxis: PPI Glucose control: monitor  Mobility: BR Code Status: Full Family Communication: Patient updated on plan of care Disposition: ICU   Labs   CBC: Recent Labs  Lab 08/20/19 1210 08/21/19 0242  WBC 14.3* 13.1*  NEUTROABS 11.8*  --   HGB 15.4 14.0  HCT 48.6 43.0  MCV 90.8 88.7  PLT 218 [id number]   Basic Metabolic Panel: Recent Labs  Lab 08/20/19 1210 08/21/19 0242  NA 138 137  K 4.0 3.2*  CL 100 102  CO2 27 25  GLUCOSE 119* 128*  BUN 27* 30*  CREATININE 1.07 1.07  CALCIUM 9.4 9.1  MG  --  2.6*  PHOS  --  3.1   GFR: Estimated Creatinine Clearance: 91.3 mL/min (by C-G formula based on SCr of 1.07 mg/dL). Recent Labs  Lab 08/20/19 1210 08/20/19 1314 08/20/19 2144 08/21/19 0242  PROCALCITON  --   --  0.27  --   WBC 14.3*  --   --  13.1*  LATICACIDVEN 1.5 1.1  --   --    Liver Function Tests: Recent Labs  Lab 08/20/19 1210  AST 26  ALT 30  ALKPHOS 74  BILITOT 1.2  PROT 7.8  ALBUMIN 3.8   No results for input(s): LIPASE, AMYLASE in the last 168 hours. No results for input(s): AMMONIA in the last 168 hours.  ABG No results found for: PHART, PCO2ART, PO2ART, HCO3, TCO2, ACIDBASEDEF, O2SAT   Coagulation Profile: Recent Labs  Lab 08/20/19 1210  INR 1.1   Cardiac Enzymes: No results for input(s): CKTOTAL, CKMB, CKMBINDEX, TROPONINI in the last 168 hours.  HbA1C: No results found for: HGBA1C  CBG: Recent Labs  Lab 08/20/19 2140  GLUCAP 34*    Noe Gens, NP-C [facility] 08/21/2019, 9:50 AM   Attending Note:  73 year old male with a saddle PE, acute hypoxemic respiratory failure and initially hemodynamic instability that was transferred from [facility] for consideration of lytics.  Overnight, remains on O2 at 5L [place] with  stable hemodynamics.  On exam, lungs are clear and patient is alert and interactive, moving all ext to commands.  Discussed with IR and PCCM-NP.  Echo pending.  Hypoxemia:  - Titrate O2 for sat of 88-92%  PE:  - Heparin  - Wait for 12 hours with therapeutic heparin prior to mobilization  - Hold in the ICU given saddle embolism and troponin  Troponin leak:  - Tele monitoring  - Troponin f/u ordered  Lytics:  - GI bleed history and not actively dying from oxygenation and hemodynamic standpoint, will not lyse, discussed with IR, if changes will re-call.  Hyperglycemia:  - CBG  - SSI  Hold in the ICU for observation until echo results then will re-examine  The patient is critically ill with multiple organ systems failure and requires high complexity decision making for assessment and support, frequent evaluation and titration of therapies, application  of advanced monitoring technologies and extensive interpretation of multiple databases.   Critical Care Time devoted to patient care services described in this note is  36  Minutes. This time reflects time of care of this signee Dr Jennet Maduro. This critical care time does not reflect procedure time, or teaching time or supervisory time of PA/NP/Med student/Med Resident etc but could involve care discussion time.  Rush Farmer, M.D. [facility] Pulmonary/Critical Care Medicine.

## 2019-08-21 NOTE — Progress Notes (Signed)
ANTICOAGULATION CONSULT NOTE   Pharmacy Consult for Heparin  Indication: pulmonary embolus  No Known Allergies  Patient Measurements: Height: 6\' 2"  (188 cm) Weight: 289 lb 11 oz (131.4 kg) IBW/kg (Calculated) : 82.2 Heparin Dosing Weight: HEPARIN DW (KG): 111.3   Vital Signs: Temp: 100.1 F (37.8 C) (11/10 2352) Temp Source: Oral (11/10 2352) BP: 97/84 (11/11 0300) Pulse Rate: 83 (11/11 0300)  Labs: Recent Labs    08/20/19 1210 08/21/19 0242  HGB 15.4 14.0  HCT 48.6 43.0  PLT 218 215  APTT 33  --   LABPROT 14.0  --   INR 1.1  --   HEPARINUNFRC  --  0.23*  CREATININE 1.07 1.07    Estimated Creatinine Clearance: 91.3 mL/min (by C-G formula based on SCr of 1.07 mg/dL).   Medical History: Past Medical History:  Diagnosis Date  . Arthritis   . CAD S/P percutaneous coronary angioplasty 2011   stents x 3  . Generalized anxiety disorder   . Hard of hearing   . Hyperlipidemia   . Hypertension   . Lower GI bleed 2014   colonscopy was normal  . Malignant melanoma of back ([place]) 12/01/2016    Medications:  Medications Prior to Admission  Medication Sig Dispense Refill Last Dose  . ALPRAZolam (XANAX) 0.25 MG tablet Take 1 tablet (0.25 mg total) by mouth at bedtime as needed for anxiety. 30 tablet 0 08/17/2019  . aspirin EC 81 MG tablet Take 81 mg by mouth daily.   08/20/2019 at 0900am  . clopidogrel (PLAVIX) 75 MG tablet Take 1 tablet (75 mg total) by mouth daily. 90 tablet 3 08/20/2019 at Unknown time  . docusate sodium (COLACE) 100 MG capsule Take 200 mg by mouth daily.    08/19/2019 at Unknown time  . hydrochlorothiazide (MICROZIDE) 12.5 MG capsule Take 1 capsule (12.5 mg total) by mouth daily. 90 capsule 3 08/20/2019 at Unknown time  . lisinopril (PRINIVIL,ZESTRIL) 20 MG tablet Take 1 tablet (20 mg total) by mouth daily. 90 tablet 3 08/20/2019 at Unknown time  . rosuvastatin (CRESTOR) 20 MG tablet Take 1 tablet (20 mg total) by mouth daily. 90 tablet 3 08/19/2019 at  Unknown time  . diphenhydrAMINE (BENADRYL) 25 MG tablet Take 25 mg by mouth as needed.   unknown   Scheduled:  . aspirin EC  81 mg Oral Daily  . Chlorhexidine Gluconate Cloth  6 each Topical Daily  . clopidogrel  75 mg Oral Daily  . docusate sodium  200 mg Oral Daily  . heparin  3,000 Units Intravenous Once  . mouth rinse  15 mL Mouth Rinse BID  . rosuvastatin  20 mg Oral Daily   Infusions:  . heparin 1,700 Units/hr (08/20/19 1728)   PRN:  Anti-infectives (From admission, onward)   Start     Dose/Rate Route Frequency Ordered Stop   08/21/19 1400  azithromycin (ZITHROMAX) 500 mg in sodium chloride 0.9 % 250 mL IVPB  Status:  Discontinued     500 mg 250 mL/hr over 60 Minutes Intravenous Every 24 hours 08/20/19 2122 08/20/19 2157   08/21/19 1300  cefTRIAXone (ROCEPHIN) 1 g in sodium chloride 0.9 % 100 mL IVPB  Status:  Discontinued     1 g 200 mL/hr over 30 Minutes Intravenous Every 24 hours 08/20/19 2122 08/20/19 2157   08/20/19 1300  cefTRIAXone (ROCEPHIN) 1 g in sodium chloride 0.9 % 100 mL IVPB     1 g 200 mL/hr over 30 Minutes Intravenous  Once 08/20/19 1257  08/20/19 1407   08/20/19 1300  azithromycin (ZITHROMAX) 500 mg in sodium chloride 0.9 % 250 mL IVPB     500 mg 250 mL/hr over 60 Minutes Intravenous  Once 08/20/19 1257 08/20/19 1540      Assessment: 73 y/o M on heparin for PE  Initial heparin level is below goal, no issues per RN, CBC ok  Goal of Therapy:  Heparin level 0.3-0.7 units/ml Monitor platelets by anticoagulation protocol: Yes   Plan:  Heparin 3000 units/hr re-bolus  Inc heparin drip to 2000 units/hr Re-check heparin level at [place], PharmD, [place] Pharmacist Phone: [phone]

## 2019-08-21 NOTE — Progress Notes (Signed)
ANTICOAGULATION CONSULT NOTE  Pharmacy Consult:  Heparin Indication: pulmonary embolus  No Known Allergies  Patient Measurements: Height: 6\' 2"  (188 cm) Weight: 289 lb 11 oz (131.4 kg) IBW/kg (Calculated) : 82.2 Heparin Dosing Weight: 111 kg  Vital Signs: Temp: 97.8 F (36.6 C) (11/11 1533) Temp Source: Oral (11/11 1533) BP: 110/77 (11/11 1900) Pulse Rate: 94 (11/11 1900)  Labs: Recent Labs    08/20/19 1210  08/20/19 2338 08/21/19 0242 08/21/19 1125 08/21/19 1246  HGB 15.4  --   --  14.0  --   --   HCT 48.6  --   --  43.0  --   --   PLT 218  --   --  215  --   --   APTT 33  --   --   --   --   --   LABPROT 14.0  --   --   --   --   --   INR 1.1  --   --   --   --   --   HEPARINUNFRC  --   --   --  0.23*  --  0.28*  CREATININE 1.07  --   --  1.07  --   --   TROPONINIHS 775*   < > 701*  --  316* 347*   < > = values in this interval not displayed.    Estimated Creatinine Clearance: 91.3 mL/min (by C-G formula based on SCr of 1.07 mg/dL).   Assessment: 54 YOM presented with chest pain, found to have large saddle PE with extension into the lobar branches.  No plan for thrombolytics currently given history of GIB and stable hemodynamics.  Pharmacy consulted to dose IV heparin.   Heparin level is therapeutic; no bleeding reported.  Goal of Therapy:  Heparin level 0.3-0.7 units/ml Monitor platelets by anticoagulation protocol: Yes   Plan:  Continue heparin gtt at 2300 units/hr F/U AM labs  Renisha Cockrum D. Mina Marble, PharmD, BCPS, [place] 08/21/2019, 10:34 PM

## 2019-08-21 NOTE — Progress Notes (Signed)
[facility] Progress Note Patient Name: [name] DOB: [birth year] MRN: [id number]   Date of Service  08/21/2019  HPI/Events of Note  K+ 3.2, Creatinine  1.07  eICU Interventions  Elink electrolyte replacement protocol for K+ ordered.        [name] [name] 08/21/2019, 6:52 AM

## 2019-08-21 NOTE — Progress Notes (Signed)
Request to IR overnight for possible PE lysis pending echocardiogram today - patient reviewed by Dr. Pascal Lux with Dr. Nelda Marseille today and currently no plans for PE lysis as patient is doing well with IV heparin.  If patient's clinical status deteriorates please re-consult IR.  Candiss Norse, PA-C

## 2019-08-22 ENCOUNTER — Inpatient Hospital Stay (HOSPITAL_COMMUNITY): Payer: Medicare Other

## 2019-08-22 LAB — BASIC METABOLIC PANEL
Anion gap: 9 (ref 5–15)
BUN: 29 mg/dL — ABNORMAL HIGH (ref 8–23)
CO2: 26 mmol/L (ref 22–32)
Calcium: 9.2 mg/dL (ref 8.9–10.3)
Chloride: 104 mmol/L (ref 98–111)
Creatinine, Ser: 1 mg/dL (ref 0.61–1.24)
GFR calc Af Amer: >60 mL/min (ref >60)
GFR calc non Af Amer: >60 mL/min (ref >60)
Glucose, Bld: 130 mg/dL — ABNORMAL HIGH (ref 70–99)
Potassium: 3.6 mmol/L (ref 3.5–5.1)
Sodium: 139 mmol/L (ref 135–145)

## 2019-08-22 LAB — CBC
HCT: 43.2 % (ref 39.0–52.0)
Hemoglobin: 14.1 g/dL (ref 13.0–17.0)
MCH: 29 pg (ref 26.0–34.0)
MCHC: 32.6 g/dL (ref 30.0–36.0)
MCV: 88.9 fL (ref 80.0–100.0)
Platelets: 250 10*3/uL (ref 150–400)
RBC: 4.86 MIL/uL (ref 4.22–5.81)
RDW: 14.2 % (ref 11.5–15.5)
WBC: 11.3 10*3/uL — ABNORMAL HIGH (ref 4.0–10.5)
nRBC: 0 % (ref 0.0–0.2)

## 2019-08-22 LAB — GLUCOSE, CAPILLARY: Glucose-Capillary: 112 mg/dL — ABNORMAL HIGH (ref 70–99)

## 2019-08-22 LAB — HEPARIN LEVEL (UNFRACTIONATED): Heparin Unfractionated: 0.41 IU/mL (ref 0.30–0.70)

## 2019-08-22 MED ORDER — POTASSIUM CHLORIDE CRYS ER 20 MEQ PO TBCR
20.0000 meq | EXTENDED_RELEASE_TABLET | ORAL | Status: AC
  Administered 2019-08-22 (×2): 20 meq via ORAL
  Filled 2019-08-22 (×2): qty 1

## 2019-08-22 NOTE — Progress Notes (Signed)
Patient Demographics:    [name], is a 73 y.o. male, DOB - 1945-10-13, [id number]  Admit date - 08/20/2019   Admitting Physician [name], MD  Outpatient Primary MD for the patient is Rutherford Guys, MD  LOS - 2   Chief Complaint  Patient presents with   Chest Pain        Subjective:    [name] today has no fevers, no emesis,  No chest pain,    Assessment  & Plan :    Active Problems:   CAD in native artery   Essential hypertension   Acute respiratory failure with hypoxia (HCC)   Lobar pneumonia (HCC)   Acute pulmonary embolus (HCC)   Pulmonary embolism (HCC)   Brief Summary:- 73 y/o male presented 11/10 with 5 day history of chest pain worse on inspiration, workup on admission found him to be hypoxic on RA, CTA with revealed large saddle PE with extension in lobar branches   A/p  1) saddle embolus--- mechanically improving, continue IV heparin, goal is 50 to 55% with grade 1 dCHF,   2)Pulmonary Nodules Bilateral measuring up to 20mm.  With no clear etiology for PE and his history of melanoma, would consider pan scan when he is more stable.  -Will need outpatient pulmonary follow up for evaluation of pulmonary nodules   3)H/o CAD-status post prior angioplasty and stenting x2 in 2011--elevated troponin Within the setting of PE, ACS less likely, continue Crestor and Plavix, with have to stop aspirin as patient is full anticoagulation for PE  4)HTN--stable, home BP meds on hold  5) acute hypoxic respiratory failure secondary to #1 above--continue to wean as able  Disposition/Need for in-Hospital Stay- patient unable to be discharged at this time due to --- acute PE requiring IV heparin, hypoxic respiratory failure requiring oxygen supplementation  Code Status : Full code  Family Communication:     (patient is alert, awake and coherent) -Discussed with wife at  bedside  Disposition Plan  : PT eval once more hemodynamically stable   Consults  :  PCCM  DVT Prophylaxis  :   Iv Heparin -   Lab Results  Component Value Date   PLT 250 08/22/2019   Inpatient Medications  Scheduled Meds:  aspirin EC  81 mg Oral Daily   Chlorhexidine Gluconate Cloth  6 each Topical Daily   clopidogrel  75 mg Oral Daily   docusate sodium  200 mg Oral Daily   mouth rinse  15 mL Mouth Rinse BID   polyethylene glycol  17 g Oral Daily   rosuvastatin  20 mg Oral Daily   Continuous Infusions:  heparin 2,400 Units/hr (08/22/19 1800)   PRN Meds:.acetaminophen **OR** acetaminophen, ALPRAZolam, calcium carbonate, morphine injection, ondansetron **OR** ondansetron (ZOFRAN) IV    Anti-infectives (From admission, onward)   Start     Dose/Rate Route Frequency Ordered Stop   08/21/19 1400  azithromycin (ZITHROMAX) 500 mg in sodium chloride 0.9 % 250 mL IVPB  Status:  Discontinued     500 mg 250 mL/hr over 60 Minutes Intravenous Every 24 hours 08/20/19 2122 08/20/19 2157   08/21/19 1300  cefTRIAXone (ROCEPHIN) 1 g in sodium chloride 0.9 % 100 mL IVPB  Status:  Discontinued  1 g 200 mL/hr over 30 Minutes Intravenous Every 24 hours 08/20/19 2122 08/20/19 2157   08/20/19 1300  cefTRIAXone (ROCEPHIN) 1 g in sodium chloride 0.9 % 100 mL IVPB     1 g 200 mL/hr over 30 Minutes Intravenous  Once 08/20/19 1257 08/20/19 1407   08/20/19 1300  azithromycin (ZITHROMAX) 500 mg in sodium chloride 0.9 % 250 mL IVPB     500 mg 250 mL/hr over 60 Minutes Intravenous  Once 08/20/19 1257 08/20/19 1540        Objective:   Vitals:   08/22/19 1500 08/22/19 1516 08/22/19 1600 08/22/19 1700  BP: 105/75  118/85 120/87  Pulse: 75  78 76  Resp: (!) 22  (!) 25 (!) 24  Temp:  98.4 F (36.9 C)    TempSrc:  Oral    SpO2: 96%  94% 97%  Weight:      Height:        Wt Readings from Last 3 Encounters:  08/22/19 131.5 kg  04/18/19 136.1 kg  12/06/18 133.7 kg      Intake/Output Summary (Last 24 hours) at 08/22/2019 1835 Last data filed at 08/22/2019 1800 Gross per 24 hour  Intake 650.05 ml  Output 2005 ml  Net -1354.95 ml    Physical Exam Gen:- Awake Alert,  obesity HEENT:- [place].AT, No sclera icterus Ears- HOH Neck-Supple Neck,No JVD,.  Nose-nasal cannula at 6 L/min Lungs-  CTAB , fair symmetrical air movement CV- S1, S2 normal, regular , tachycardic Abd-  +ve B.Sounds, Abd Soft, No tenderness,    Extremity/Skin:- No  edema, pedal pulses present  Psych-affect is appropriate, oriented x3 Neuro-generalized weakness, no new focal deficits, no tremors   Data Review:   Micro Results Recent Results (from the past 240 hour(s))  SARS CORONAVIRUS 2 (TAT 6-24 HRS) Nasopharyngeal Nasopharyngeal Swab     Status: None   Collection Time: 08/20/19 11:13 AM   Specimen: Nasopharyngeal Swab  Result Value Ref Range Status   SARS Coronavirus 2 NEGATIVE NEGATIVE Final    Comment: (NOTE) SARS-CoV-2 target nucleic acids are NOT DETECTED. The SARS-CoV-2 RNA is generally detectable in upper and lower respiratory specimens during the acute phase of infection. Negative results do not preclude SARS-CoV-2 infection, do not rule out co-infections with other pathogens, and should not be used as the sole basis for treatment or other patient management decisions. Negative results must be combined with clinical observations, patient history, and epidemiological information. The expected result is Negative. Fact Sheet for Patients: SugarRoll.be Fact Sheet for Healthcare Providers: https://www.woods-mathews.com/ This test is not yet approved or cleared by the [place] FDA and  has been authorized for detection and/or diagnosis of SARS-CoV-2 by FDA under an Emergency Use Authorization (EUA). This EUA will remain  in effect (meaning this test can be used) for the duration of the COVID-19 declaration under Section  56 4(b)(1) of the Act, 21 U.S.C. section 360bbb-3(b)(1), unless the authorization is terminated or revoked sooner. Performed at [facility], [place] [address], [place], [place] [address]   Blood Culture (routine x 2)     Status: None (Preliminary result)   Collection Time: 08/20/19 12:10 PM   Specimen: BLOOD LEFT ARM  Result Value Ref Range Status   Specimen Description BLOOD LEFT ARM  Final   Special Requests   Final    BOTTLES DRAWN AEROBIC AND ANAEROBIC Blood Culture adequate volume   Culture   Final    NO GROWTH 2 DAYS Performed at [facility]  [facility], [address] [place], [place] [address]    Report Status PENDING  Incomplete  Blood Culture (routine x 2)     Status: None (Preliminary result)   Collection Time: 08/20/19 12:25 PM   Specimen: BLOOD LEFT HAND  Result Value Ref Range Status   Specimen Description BLOOD LEFT HAND  Final   Special Requests   Final    BOTTLES DRAWN AEROBIC AND ANAEROBIC Blood Culture results may not be optimal due to an inadequate volume of blood received in culture bottles   Culture   Final    NO GROWTH 2 DAYS Performed at [facility], [address], [place], [place] [address]    Report Status PENDING  Incomplete  MRSA PCR Screening     Status: None   Collection Time: 08/20/19  9:25 PM   Specimen: Nasopharyngeal  Result Value Ref Range Status   MRSA by PCR NEGATIVE NEGATIVE Final    Comment:        The GeneXpert MRSA Assay (FDA approved for NASAL specimens only), is one component of a comprehensive MRSA colonization surveillance program. It is not intended to diagnose MRSA infection nor to guide or monitor treatment for MRSA infections. Performed at [facility], [place] [address] [place], [place] [address]     Radiology Reports Ct Angio Chest Pe W Or Wo Contrast  Addendum Date: 08/20/2019   ADDENDUM REPORT: 08/20/2019 16:53 ADDENDUM: These results were called by telephone at the time of interpretation on 08/20/2019 at 4:53 pm  to provider DAVID TAT , who verbally acknowledged these results. Electronically Signed   By: Constance Holster M.D.   On: 08/20/2019 16:53   Result Date: 08/20/2019 CLINICAL DATA:  Dyspnea. Shortness of breath. Left-sided chest pain. EXAM: CT ANGIOGRAPHY CHEST WITH CONTRAST TECHNIQUE: Multidetector CT imaging of the chest was performed using the standard protocol during bolus administration of intravenous contrast. Multiplanar CT image reconstructions and MIPs were obtained to evaluate the vascular anatomy. CONTRAST:  147mL OMNIPAQUE IOHEXOL 350 MG/ML SOLN COMPARISON:  None. FINDINGS: Cardiovascular: Contrast injection is sufficient to demonstrate satisfactory opacification of the pulmonary arteries to the segmental level.There is a saddle pulmonary embolism with extension into the lobar branches bilaterally. Essentially all lobes bilaterally are involved. There is further extension into the segmental and subsegmental branches bilaterally. There is CT evidence for right-sided heart strain with an RV LV ratio measuring approximately 1.36. There are atherosclerotic changes of the thoracic aorta. Coronary artery calcifications are noted. The heart is not significantly enlarged. Mediastinum/Nodes: --No mediastinal or hilar lymphadenopathy. --No axillary lymphadenopathy. --No supraclavicular lymphadenopathy. --Normal thyroid gland. --The esophagus is unremarkable Lungs/Pleura: There is a 5 mm pulmonary nodule in the right middle lobe (axial series 6, image 103). There is a 6 mm pulmonary nodule in the left lower lobe (axial series 6, image 77). There are diffuse ground-glass airspace opacities in the left lower lobe. There is a trace left-sided pleural effusion. There is a small amount of atelectasis at the left lung base. The trachea is unremarkable. There is no large pneumothorax. Upper Abdomen: There are geographic attenuation differences throughout the right hepatic lobe favored to be secondary to heterogeneous  fatty infiltration of the liver. There is no discrete hepatic mass. There is diverticulosis without CT evidence for diverticulitis involving the visualized portions of the colon. Musculoskeletal: No chest wall abnormality. No acute or significant osseous findings. Review of the MIP images confirms the above findings. IMPRESSION: 1. Large saddle pulmonary embolism with extension into the lobar  branches bilaterally. There is CT evidence of right-sided heart strain with the RV/LV ratio measuring approximately 1.36. 2. Ground-glass airspace opacity in the left lower lobe favored to represent an evolving pulmonary infarct in the setting of extensive acute pulmonary emboli. An underlying infectious process is not entirely excluded. 3. Trace left-sided pleural effusion. 4. Bilateral pulmonary nodules measuring up to 6 mm. Non-contrast chest CT at 3-6 months is recommended. If the nodules are stable at time of repeat CT, then future CT at 18-24 months (from today's scan) is considered optional for low-risk patients, but is recommended for high-risk patients. This recommendation follows the consensus statement: Guidelines for Management of Incidental Pulmonary Nodules Detected on CT Images: From the Fleischner Society 2017; Radiology 2017; 284:228-243. 5. Hepatic steatosis. Electronically Signed: By: Constance Holster M.D. On: 08/20/2019 16:48   Dg Chest Port 1 View  Result Date: 08/22/2019 CLINICAL DATA:  Pulmonary embolism. EXAM: PORTABLE CHEST 1 VIEW COMPARISON:  August 20, 2019. FINDINGS: Stable cardiomediastinal silhouette. No pneumothorax is noted. Minimal right basilar subsegmental atelectasis is noted. Stable left basilar atelectasis or infiltrate is noted with associated pleural effusion. Bony thorax is unremarkable. IMPRESSION: Stable left basilar opacity as described above. Electronically Signed   By: Marijo Conception M.D.   On: 08/22/2019 07:28   Dg Chest Port 1 View  Result Date: 08/20/2019 CLINICAL  DATA:  Cough, left sided chest pain. Additional history provided: History of 3 cardiac stents. Weakness, coughing with blood tinged sputum. EXAM: PORTABLE CHEST 1 VIEW COMPARISON:  No pertinent prior studies available for comparison. FINDINGS: The left heart border is partially obscured, limiting evaluation of heart size. Left basilar opacity consistent with atelectasis and/or pneumonia as well as left pleural effusion. Mild linear atelectasis within the right lung base. No evidence of pneumothorax. No acute bony abnormality. IMPRESSION: Atelectasis and/or pneumonia as well as left pleural effusion at the left lung base. Electronically Signed   By: Kellie Simmering DO   On: 08/20/2019 12:54     CBC Recent Labs  Lab 08/20/19 1210 08/21/19 0242 08/22/19 0341  WBC 14.3* 13.1* 11.3*  HGB 15.4 14.0 14.1  HCT 48.6 43.0 43.2  PLT 218 215 250  MCV 90.8 88.7 88.9  MCH 28.8 28.9 29.0  MCHC 31.7 32.6 32.6  RDW 14.6 14.3 14.2  LYMPHSABS 0.8  --   --   MONOABS 1.6*  --   --   EOSABS 0.0  --   --   BASOSABS 0.0  --   --     Chemistries  Recent Labs  Lab 08/20/19 1210 08/21/19 0242 08/22/19 0341  NA 138 137 139  K 4.0 3.2* 3.6  CL 100 102 104  CO2 27 25 26   GLUCOSE 119* 128* 130*  BUN 27* 30* 29*  CREATININE 1.07 1.07 1.00  CALCIUM 9.4 9.1 9.2  MG  --  2.6*  --   AST 26  --   --   ALT 30  --   --   ALKPHOS 74  --   --   BILITOT 1.2  --   --    ------------------------------------------------------------------------------------------------------------------ No results for input(s): CHOL, HDL, LDLCALC, TRIG, CHOLHDL, LDLDIRECT in the last 72 hours.  No results found for: HGBA1C ------------------------------------------------------------------------------------------------------------------ No results for input(s): TSH, T4TOTAL, T3FREE, THYROIDAB in the last 72 hours.  Invalid input(s):  FREET3 ------------------------------------------------------------------------------------------------------------------ No results for input(s): VITAMINB12, FOLATE, FERRITIN, TIBC, IRON, RETICCTPCT in the last 72 hours.  Coagulation profile Recent Labs  Lab 08/20/19 1210  INR 1.1    No results for input(s): DDIMER in the last 72 hours.  Cardiac Enzymes No results for input(s): CKMB, TROPONINI, MYOGLOBIN in the last 168 hours.  Invalid input(s): CK ------------------------------------------------------------------------------------------------------------------    Component Value Date/Time   BNP 126.0 (H) 08/20/2019 1210     Roxan Hockey M.D on 08/22/2019 at 6:35 PM  Go to [email or web address] - for contact info  [facility] - Office  [phone]

## 2019-08-22 NOTE — Progress Notes (Signed)
[facility] ADULT ICU REPLACEMENT PROTOCOL FOR AM LAB REPLACEMENT ONLY  The patient does apply for the [facility] Adult ICU Electrolyte Replacment Protocol based on the criteria listed below:   1. Is GFR >/= 40 ml/min? Yes.    Patient's GFR today is >60 2. Is urine output >/= 0.5 ml/kg/hr for the last 6 hours? Yes.   Patient's UOP is .6 ml/kg/hr 3. Is BUN < 60 mg/dL? Yes.   Patient's BUN today is 29 4. Abnormal electrolyte(s): K-3.6 5. Ordered repletion with: per protocol 6. If a panic level lab has been reported, has the CCM MD in charge been notified? Yes.  .   Physician:  Dr. Jonetta Speak, Philis Nettle 08/22/2019 6:24 AM

## 2019-08-22 NOTE — Progress Notes (Addendum)
ANTICOAGULATION CONSULT NOTE  Pharmacy Consult:  Heparin Indication: pulmonary embolus  No Known Allergies  Patient Measurements: Height: 6\' 2"  (188 cm) Weight: 289 lb 14.5 oz (131.5 kg) IBW/kg (Calculated) : 82.2 Heparin Dosing Weight: 111 kg  Vital Signs: Temp: 97.9 F (36.6 C) (11/12 0337) Temp Source: Oral (11/12 0337) BP: 115/93 (11/12 0200) Pulse Rate: 81 (11/12 0200)  Labs: Recent Labs    08/20/19 1210  08/20/19 2338  08/21/19 0242 08/21/19 1125 08/21/19 1246 08/21/19 2132 08/22/19 0341  HGB 15.4  --   --   --  14.0  --   --   --  14.1  HCT 48.6  --   --   --  43.0  --   --   --  43.2  PLT 218  --   --   --  215  --   --   --  250  APTT 33  --   --   --   --   --   --   --   --   LABPROT 14.0  --   --   --   --   --   --   --   --   INR 1.1  --   --   --   --   --   --   --   --   HEPARINUNFRC  --   --   --    < > 0.23*  --  0.28* 0.39 0.41  CREATININE 1.07  --   --   --  1.07  --   --   --  1.00  TROPONINIHS 775*   < > 701*  --   --  316* 347*  --   --    < > = values in this interval not displayed.    Estimated Creatinine Clearance: 97.7 mL/min (by C-G formula based on SCr of 1 mg/dL).   Assessment: 61 YOM presented with chest pain, found to have large saddle PE with extension into the lobar branches.  No plan for thrombolytics currently given history of GIB and stable hemodynamics.  Pharmacy consulted to dose IV heparin.   Confirmatory heparin level is therapeutic at 0.41, though given severity of saddle PE and prolonged time at subtherapeutic HL, will aim for higher end of heparin level goal. Hg and PLT stable.  Goal of Therapy:  Heparin level 0.3-0.7 units/ml Monitor platelets by anticoagulation protocol: Yes   Plan:  Increase heparin gtt to 2400 units/hr Obtain HL with AM labs. Monitor for signs and symptoms of bleeding, Hg, PLT.  Onnie Boer; PharmD Candidate 08/22/2019, 7:08 AM

## 2019-08-23 LAB — BASIC METABOLIC PANEL
Anion gap: 10 (ref 5–15)
BUN: 26 mg/dL — ABNORMAL HIGH (ref 8–23)
CO2: 28 mmol/L (ref 22–32)
Calcium: 9 mg/dL (ref 8.9–10.3)
Chloride: 102 mmol/L (ref 98–111)
Creatinine, Ser: 1.08 mg/dL (ref 0.61–1.24)
GFR calc Af Amer: >60 mL/min (ref >60)
GFR calc non Af Amer: >60 mL/min (ref >60)
Glucose, Bld: 100 mg/dL — ABNORMAL HIGH (ref 70–99)
Potassium: 4.3 mmol/L (ref 3.5–5.1)
Sodium: 140 mmol/L (ref 135–145)

## 2019-08-23 LAB — HEPARIN LEVEL (UNFRACTIONATED): Heparin Unfractionated: 0.46 IU/mL (ref 0.30–0.70)

## 2019-08-23 MED ORDER — RIVAROXABAN 15 MG PO TABS
15.0000 mg | ORAL_TABLET | Freq: Two times a day (BID) | ORAL | Status: DC
  Administered 2019-08-23 – 2019-08-24 (×3): 15 mg via ORAL
  Filled 2019-08-23 (×4): qty 1

## 2019-08-23 MED ORDER — RIVAROXABAN (XARELTO) EDUCATION KIT FOR DVT/PE PATIENTS
PACK | Freq: Once | Status: AC
  Administered 2019-08-23: 12:00:00
  Filled 2019-08-23: qty 1

## 2019-08-23 MED ORDER — SALINE SPRAY 0.65 % NA SOLN
2.0000 | NASAL | Status: DC | PRN
  Filled 2019-08-23: qty 44

## 2019-08-23 MED ORDER — RIVAROXABAN 20 MG PO TABS: 20.0000 mg | ORAL_TABLET | Freq: Every day | ORAL | Status: DC

## 2019-08-23 NOTE — Progress Notes (Signed)
Patient Demographics:    Tysin Rarey, is a 73 y.o. male, DOB - 28-May-1946, [id number]  Admit date - 08/20/2019   Admitting Physician [name], MD  Outpatient Primary MD for the patient is Rutherford Guys, MD  LOS - 3   Chief Complaint  Patient presents with   Chest Pain        Subjective:    Allen Kell today has no fevers, no emesis,   -Some chest discomfort with cough otherwise no chest pains -Wife at bedside, questions answered -Patient had episode of nosebleed, resolved with pressure and ice  Assessment  & Plan :    Active Problems:   CAD in native artery   Essential hypertension   Acute respiratory failure with hypoxia (HCC)   Lobar pneumonia (HCC)   Acute pulmonary embolus (HCC)   Pulmonary embolism (HCC)   Brief Summary:- 73 y/o male presented 11/10 with 5 day history of chest pain worse on inspiration, workup on admission found him to be hypoxic on RA, CTA with revealed large saddle PE with extension in lobar branches   A/p 1)Saddle Embolus--- clinically continues to improve -transition from IV heparin to p.o. Xarelto,  -Echo with EF of 50 to 55% with grade 1 dCHF,  -Hypoxia persist  2)Pulmonary Nodules Bilateral measuring up to 63mm.  With no clear etiology for PE and his history of melanoma, would consider pan scan when he is more stable.  -Will need outpatient pulmonary follow up for evaluation of pulmonary nodules   3)H/o CAD-status post prior angioplasty and stenting x2 in 2011--elevated troponin in the setting of PE, ACS less likely, continue Crestor and Plavix, with have to stop aspirin as patient is full anticoagulation for PE  4)HTN--stable, continue to hold lisinopril/HCTZ  5)Acute hypoxic respiratory failure secondary to #1 above--continue to wean as able,  -currently requiring 6 L of oxygen  Disposition/Need for in-Hospital Stay- patient unable to  be discharged at this time due to --- acute hypoxic respiratory failure requiring high flow oxygen supplementation and nosebleeds in the setting of anticoagulation  Code Status : Full code  Family Communication:     (patient is alert, awake and coherent) -Discussed with wife at bedside  Disposition Plan  : PT eval appreciated,  recommend no further rehab  Consults  :  PCCM  DVT Prophylaxis  : Xarelto  Lab Results  Component Value Date   PLT 250 08/22/2019   Inpatient Medications  Scheduled Meds:  Chlorhexidine Gluconate Cloth  6 each Topical Daily   clopidogrel  75 mg Oral Daily   docusate sodium  200 mg Oral Daily   mouth rinse  15 mL Mouth Rinse BID   polyethylene glycol  17 g Oral Daily   rivaroxaban  15 mg Oral BID WC   Followed by   Derrill Memo ON 09/13/2019] rivaroxaban  20 mg Oral Q supper   rosuvastatin  20 mg Oral Daily   Continuous Infusions:  PRN Meds:.acetaminophen **OR** acetaminophen, ALPRAZolam, calcium carbonate, morphine injection, ondansetron **OR** ondansetron (ZOFRAN) IV, sodium chloride   Anti-infectives (From admission, onward)   Start     Dose/Rate Route Frequency Ordered Stop   08/21/19 1400  azithromycin (ZITHROMAX) 500 mg in sodium chloride 0.9 % 250 mL IVPB  Status:  Discontinued     500 mg 250 mL/hr over 60 Minutes Intravenous Every 24 hours 08/20/19 2122 08/20/19 2157   08/21/19 1300  cefTRIAXone (ROCEPHIN) 1 g in sodium chloride 0.9 % 100 mL IVPB  Status:  Discontinued     1 g 200 mL/hr over 30 Minutes Intravenous Every 24 hours 08/20/19 2122 08/20/19 2157   08/20/19 1300  cefTRIAXone (ROCEPHIN) 1 g in sodium chloride 0.9 % 100 mL IVPB     1 g 200 mL/hr over 30 Minutes Intravenous  Once 08/20/19 1257 08/20/19 1407   08/20/19 1300  azithromycin (ZITHROMAX) 500 mg in sodium chloride 0.9 % 250 mL IVPB     500 mg 250 mL/hr over 60 Minutes Intravenous  Once 08/20/19 1257 08/20/19 1540       Objective:   Vitals:   08/23/19 0900 08/23/19  1000 08/23/19 1100 08/23/19 1500  BP: 122/73 104/78 126/86   Pulse: 78 68 67   Resp: 19 (!) 21 17   Temp:   98.2 F (36.8 C) 97.8 F (36.6 C)  TempSrc:   Oral Oral  SpO2: 92% 94% 93%   Weight:      Height:        Wt Readings from Last 3 Encounters:  08/23/19 129.8 kg  04/18/19 136.1 kg  12/06/18 133.7 kg     Intake/Output Summary (Last 24 hours) at 08/23/2019 1637 Last data filed at 08/23/2019 1000 Gross per 24 hour  Intake 445.03 ml  Output 375 ml  Net 70.03 ml    Physical Exam Gen:- Awake Alert,  obesity HEENT:- [place].AT, No sclera icterus 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  Nose-nasal cannula at 6 L/min (nose Bleed) Lungs-diminished breath sounds, no wheezing  CV- S1, S2 normal, regular , tachycardic Abd-  +ve B.Sounds, Abd Soft, No tenderness,    Extremity/Skin:- No  edema, pedal pulses present  Psych-affect is appropriate, oriented x3 Neuro-generalized weakness, no new focal deficits, no tremors   Data Review:   Micro Results Recent Results (from the past 240 hour(s))  SARS CORONAVIRUS 2 (TAT 6-24 HRS) Nasopharyngeal Nasopharyngeal Swab     Status: None   Collection Time: 08/20/19 11:13 AM   Specimen: Nasopharyngeal Swab  Result Value Ref Range Status   SARS Coronavirus 2 NEGATIVE NEGATIVE Final    Comment: (NOTE) SARS-CoV-2 target nucleic acids are NOT DETECTED. The SARS-CoV-2 RNA is generally detectable in upper and lower respiratory specimens during the acute phase of infection. Negative results do not preclude SARS-CoV-2 infection, do not rule out co-infections with other pathogens, and should not be used as the sole basis for treatment or other patient management decisions. Negative results must be combined with clinical observations, patient history, and epidemiological information. The expected result is Negative. Fact Sheet for Patients: SugarRoll.be Fact Sheet for Healthcare  Providers: https://www.woods-mathews.com/ This test is not yet approved or cleared by the [place] FDA and  has been authorized for detection and/or diagnosis of SARS-CoV-2 by FDA under an Emergency Use Authorization (EUA). This EUA will remain  in effect (meaning this test can be used) for the duration of the COVID-19 declaration under Section 56 4(b)(1) of the Act, 21 U.S.C. section 360bbb-3(b)(1), unless the authorization is terminated or revoked sooner. Performed at [facility], [place] [address], [place], [place] [address]   Blood Culture (routine x 2)     Status: None (Preliminary result)   Collection Time: 08/20/19 12:10 PM   Specimen: BLOOD LEFT  ARM  Result Value Ref Range Status   Specimen Description BLOOD LEFT ARM  Final   Special Requests   Final    BOTTLES DRAWN AEROBIC AND ANAEROBIC Blood Culture adequate volume   Culture   Final    NO GROWTH 3 DAYS Performed at [facility], [address], [place], [place] [address]    Report Status PENDING  Incomplete  Blood Culture (routine x 2)     Status: None (Preliminary result)   Collection Time: 08/20/19 12:25 PM   Specimen: BLOOD LEFT HAND  Result Value Ref Range Status   Specimen Description BLOOD LEFT HAND  Final   Special Requests   Final    BOTTLES DRAWN AEROBIC AND ANAEROBIC Blood Culture results may not be optimal due to an inadequate volume of blood received in culture bottles   Culture   Final    NO GROWTH 3 DAYS Performed at [facility], [address] [place], [place] [address]    Report Status PENDING  Incomplete  MRSA PCR Screening     Status: None   Collection Time: 08/20/19  9:25 PM   Specimen: Nasopharyngeal  Result Value Ref Range Status   MRSA by PCR NEGATIVE NEGATIVE Final    Comment:        The GeneXpert MRSA Assay (FDA approved for NASAL specimens only), is one component of a comprehensive MRSA colonization surveillance program. It is not intended to diagnose  MRSA infection nor to guide or monitor treatment for MRSA infections. Performed at [facility], [place] [address] [place], [place] [address]    Radiology Reports Ct Angio Chest Pe W Or Wo Contrast  Addendum Date: 08/20/2019   ADDENDUM REPORT: 08/20/2019 16:53 ADDENDUM: These results were called by telephone at the time of interpretation on 08/20/2019 at 4:53 pm to provider DAVID TAT , who verbally acknowledged these results. Electronically Signed   By: Constance Holster M.D.   On: 08/20/2019 16:53   Result Date: 08/20/2019 CLINICAL DATA:  Dyspnea. Shortness of breath. Left-sided chest pain. EXAM: CT ANGIOGRAPHY CHEST WITH CONTRAST TECHNIQUE: Multidetector CT imaging of the chest was performed using the standard protocol during bolus administration of intravenous contrast. Multiplanar CT image reconstructions and MIPs were obtained to evaluate the vascular anatomy. CONTRAST:  167mL OMNIPAQUE IOHEXOL 350 MG/ML SOLN COMPARISON:  None. FINDINGS: Cardiovascular: Contrast injection is sufficient to demonstrate satisfactory opacification of the pulmonary arteries to the segmental level.There is a saddle pulmonary embolism with extension into the lobar branches bilaterally. Essentially all lobes bilaterally are involved. There is further extension into the segmental and subsegmental branches bilaterally. There is CT evidence for right-sided heart strain with an RV LV ratio measuring approximately 1.36. There are atherosclerotic changes of the thoracic aorta. Coronary artery calcifications are noted. The heart is not significantly enlarged. Mediastinum/Nodes: --No mediastinal or hilar lymphadenopathy. --No axillary lymphadenopathy. --No supraclavicular lymphadenopathy. --Normal thyroid gland. --The esophagus is unremarkable Lungs/Pleura: There is a 5 mm pulmonary nodule in the right middle lobe (axial series 6, image 103). There is a 6 mm pulmonary nodule in the left lower lobe (axial series 6, image 77).  There are diffuse ground-glass airspace opacities in the left lower lobe. There is a trace left-sided pleural effusion. There is a small amount of atelectasis at the left lung base. The trachea is unremarkable. There is no large pneumothorax. Upper Abdomen: There are geographic attenuation differences throughout the right hepatic lobe favored to be secondary to heterogeneous fatty infiltration of the liver. There  is no discrete hepatic mass. There is diverticulosis without CT evidence for diverticulitis involving the visualized portions of the colon. Musculoskeletal: No chest wall abnormality. No acute or significant osseous findings. Review of the MIP images confirms the above findings. IMPRESSION: 1. Large saddle pulmonary embolism with extension into the lobar branches bilaterally. There is CT evidence of right-sided heart strain with the RV/LV ratio measuring approximately 1.36. 2. Ground-glass airspace opacity in the left lower lobe favored to represent an evolving pulmonary infarct in the setting of extensive acute pulmonary emboli. An underlying infectious process is not entirely excluded. 3. Trace left-sided pleural effusion. 4. Bilateral pulmonary nodules measuring up to 6 mm. Non-contrast chest CT at 3-6 months is recommended. If the nodules are stable at time of repeat CT, then future CT at 18-24 months (from today's scan) is considered optional for low-risk patients, but is recommended for high-risk patients. This recommendation follows the consensus statement: Guidelines for Management of Incidental Pulmonary Nodules Detected on CT Images: From the Fleischner Society 2017; Radiology 2017; 284:228-243. 5. Hepatic steatosis. Electronically Signed: By: Constance Holster M.D. On: 08/20/2019 16:48   Dg Chest Port 1 View  Result Date: 08/22/2019 CLINICAL DATA:  Pulmonary embolism. EXAM: PORTABLE CHEST 1 VIEW COMPARISON:  August 20, 2019. FINDINGS: Stable cardiomediastinal silhouette. No pneumothorax  is noted. Minimal right basilar subsegmental atelectasis is noted. Stable left basilar atelectasis or infiltrate is noted with associated pleural effusion. Bony thorax is unremarkable. IMPRESSION: Stable left basilar opacity as described above. Electronically Signed   By: Marijo Conception M.D.   On: 08/22/2019 07:28   Dg Chest Port 1 View  Result Date: 08/20/2019 CLINICAL DATA:  Cough, left sided chest pain. Additional history provided: History of 3 cardiac stents. Weakness, coughing with blood tinged sputum. EXAM: PORTABLE CHEST 1 VIEW COMPARISON:  No pertinent prior studies available for comparison. FINDINGS: The left heart border is partially obscured, limiting evaluation of heart size. Left basilar opacity consistent with atelectasis and/or pneumonia as well as left pleural effusion. Mild linear atelectasis within the right lung base. No evidence of pneumothorax. No acute bony abnormality. IMPRESSION: Atelectasis and/or pneumonia as well as left pleural effusion at the left lung base. Electronically Signed   By: Kellie Simmering DO   On: 08/20/2019 12:54    CBC Recent Labs  Lab 08/20/19 1210 08/21/19 0242 08/22/19 0341  WBC 14.3* 13.1* 11.3*  HGB 15.4 14.0 14.1  HCT 48.6 43.0 43.2  PLT 218 215 250  MCV 90.8 88.7 88.9  MCH 28.8 28.9 29.0  MCHC 31.7 32.6 32.6  RDW 14.6 14.3 14.2  LYMPHSABS 0.8  --   --   MONOABS 1.6*  --   --   EOSABS 0.0  --   --   BASOSABS 0.0  --   --     Chemistries  Recent Labs  Lab 08/20/19 1210 08/21/19 0242 08/22/19 0341 08/23/19 0411  NA 138 137 139 140  K 4.0 3.2* 3.6 4.3  CL 100 102 104 102  CO2 27 25 26 28   GLUCOSE 119* 128* 130* 100*  BUN 27* 30* 29* 26*  CREATININE 1.07 1.07 1.00 1.08  CALCIUM 9.4 9.1 9.2 9.0  MG  --  2.6*  --   --   AST 26  --   --   --   ALT 30  --   --   --   ALKPHOS 74  --   --   --   BILITOT 1.2  --   --   --     ------------------------------------------------------------------------------------------------------------------  No results for input(s): CHOL, HDL, LDLCALC, TRIG, CHOLHDL, LDLDIRECT in the last 72 hours.  No results found for: HGBA1C ------------------------------------------------------------------------------------------------------------------ No results for input(s): TSH, T4TOTAL, T3FREE, THYROIDAB in the last 72 hours.  Invalid input(s): FREET3 ------------------------------------------------------------------------------------------------------------------ No results for input(s): VITAMINB12, FOLATE, FERRITIN, TIBC, IRON, RETICCTPCT in the last 72 hours.  Coagulation profile Recent Labs  Lab 08/20/19 1210  INR 1.1   No results for input(s): DDIMER in the last 72 hours.  Cardiac Enzymes No results for input(s): CKMB, TROPONINI, MYOGLOBIN in the last 168 hours.  Invalid input(s): CK ------------------------------------------------------------------------------------------------------------------    Component Value Date/Time   BNP 126.0 (H) 08/20/2019 1210   Roxan Hockey M.D on 08/23/2019 at 4:37 PM  Go to [email or web address] - for contact info  [facility] - Office  [phone]

## 2019-08-23 NOTE — Evaluation (Signed)
Physical Therapy Evaluation Patient Details Name: [name] MRN: [id number] DOB: 25-Feb-1946 Today's Date: 08/23/2019   History of Present Illness  73 y.o. male with medical history of coronary disease, hypertension, hyperlipidemia, anxiety, GI bleed presenting with 5-day history of substernal and left-sided chest pain.  EKG showed sinus rhythm with nonspecific T wave changes. CT angio which revealed saddle pulmonary embolism with evidence of right heart strain. Admitted 08/20/19 for treatment of pulmonary embolism and pulmonary nodules.   Clinical Impression  PTA pt living with wife in single story home with 4 steps to enter. Pt was completely independent, doing yard work in days immediately prior to admission. Pt currently is limited in safe mobility by increased O2 demand, in presence of generalized weakness. Pt is mod I for bed mobility and requires supervision for transfers and ambulation of 60 feet without AD. PT will continue to follow acutely to progress ambulation and for stair training. Anticipate pt will not have additional PT needs at discharge.    Follow Up Recommendations No PT follow up    Equipment Recommendations  None recommended by PT    Recommendations for Other Services OT consult(for energy conservation with self care)     Precautions / Restrictions Precautions Precautions: None Restrictions Weight Bearing Restrictions: No      Mobility  Bed Mobility Overal bed mobility: Modified Independent             General bed mobility comments: HoB elevated and use of bed rails  Transfers Overall transfer level: Needs assistance Equipment used: None Transfers: Sit to/from Stand Sit to Stand: Supervision         General transfer comment: supervision for safety, good power up and steadying  Ambulation/Gait Ambulation/Gait assistance: Supervision Gait Distance (Feet): 60 Feet Assistive device: None Gait Pattern/deviations: Decreased weight shift to  right;Decreased step length - right;Decreased step length - left;Step-through pattern;Wide base of support Gait velocity: slowed Gait velocity interpretation: <1.8 ft/sec, indicate of risk for recurrent falls General Gait Details: pt with historical R hip deficits, to compensate pt with increased L lateral trunk flexion to advance R LE. this causes minor instability but no overt LOB        Balance Overall balance assessment: Mild deficits observed, not formally tested                                           Pertinent Vitals/Pain Pain Assessment: No/denies pain    Home Living Family/patient expects to be discharged to:: Private residence Living Arrangements: Spouse/significant other   Type of Home: House Home Access: Stairs to enter Entrance Stairs-Rails: Psychiatric nurse of Steps: 4 Home Layout: One level Home Equipment: Cane - single point;Hand held shower head;Shower seat - built in      Prior Function Level of Independence: Independent         Comments: yard work         English as a second language teacher Extremity Assessment Upper Extremity Assessment: Overall WFL for tasks assessed    Lower Extremity Assessment Lower Extremity Assessment: RLE deficits/detail RLE Deficits / Details: R hip ROM limited by pain, strength grossly 3+/5 LLE Deficits / Details: ROM WFL, strength grossly 4/5    Cervical / Trunk Assessment Cervical / Trunk Assessment: Kyphotic  Communication   Communication: HOH(reads lips (may need to lower mask))  Cognition Arousal/Alertness: Awake/alert Behavior During Therapy: [facility] for tasks assessed/performed  Overall Cognitive Status: Within Functional Limits for tasks assessed                                        General Comments General comments (skin integrity, edema, etc.): Pt on 6L O2 via HFNC, able to maintain SaO2 throughout ambulation >92%O2. HR max with ambulation 96 bpm         Assessment/Plan    PT Assessment Patient needs continued PT services  PT Problem List Decreased strength;Decreased activity tolerance;Decreased balance;Decreased mobility;Decreased range of motion;Decreased coordination;Cardiopulmonary status limiting activity       PT Treatment Interventions DME instruction;Gait training;Stair training;Functional mobility training;Therapeutic activities;Therapeutic exercise;Balance training;Cognitive remediation;Patient/family education    PT Goals (Current goals can be found in the Care Plan section)  Acute Rehab PT Goals Patient Stated Goal: get back home PT Goal Formulation: With patient/family Time For Goal Achievement: 09/06/19 Potential to Achieve Goals: Good    Frequency Min 3X/week    AM-PAC PT "6 Clicks" Mobility  Outcome Measure Help needed turning from your back to your side while in a flat bed without using bedrails?: None Help needed moving from lying on your back to sitting on the side of a flat bed without using bedrails?: None Help needed moving to and from a bed to a chair (including a wheelchair)?: None Help needed standing up from a chair using your arms (e.g., wheelchair or bedside chair)?: None Help needed to walk in hospital room?: None Help needed climbing 3-5 steps with a railing? : A Little 6 Click Score: 23    End of Session Equipment Utilized During Treatment: Gait belt;Oxygen Activity Tolerance: Patient tolerated treatment well Patient left: in chair;with call bell/phone within reach;with family/visitor present Nurse Communication: Mobility status PT Visit Diagnosis: Unsteadiness on feet (R26.81);Other abnormalities of gait and mobility (R26.89);Muscle weakness (generalized) (M62.81);Difficulty in walking, not elsewhere classified (R26.2)    Time: [id number] PT Time Calculation (min) (ACUTE ONLY): 36 min   Charges:   PT Evaluation $PT Eval Moderate Complexity: 1 Mod PT Treatments $Gait Training: 8-22 mins         Pavle Wiler B. Migdalia Dk PT, DPT Acute Rehabilitation Services Pager [phone] Office [phone]   [place] 08/23/2019, 12:48 PM

## 2019-08-24 LAB — CBC
HCT: 43.7 % (ref 39.0–52.0)
Hemoglobin: 14.4 g/dL (ref 13.0–17.0)
MCH: 28.7 pg (ref 26.0–34.0)
MCHC: 33 g/dL (ref 30.0–36.0)
MCV: 87.1 fL (ref 80.0–100.0)
Platelets: 284 10*3/uL (ref 150–400)
RBC: 5.02 MIL/uL (ref 4.22–5.81)
RDW: 13.7 % (ref 11.5–15.5)
WBC: 9.5 10*3/uL (ref 4.0–10.5)
nRBC: 0 % (ref 0.0–0.2)

## 2019-08-24 MED ORDER — RIVAROXABAN 20 MG PO TABS: 20.0000 mg | ORAL_TABLET | Freq: Every day | ORAL | 5 refills | Status: DC

## 2019-08-24 MED ORDER — CALCIUM CARBONATE ANTACID 500 MG PO CHEW: 2.0000 | CHEWABLE_TABLET | Freq: Four times a day (QID) | ORAL | 2 refills | Status: DC | PRN

## 2019-08-24 MED ORDER — CLOPIDOGREL BISULFATE 75 MG PO TABS: 75.0000 mg | ORAL_TABLET | Freq: Every day | ORAL | 3 refills | Status: DC

## 2019-08-24 MED ORDER — SENNOSIDES-DOCUSATE SODIUM 8.6-50 MG PO TABS: 2.0000 | ORAL_TABLET | Freq: Every day | ORAL | 1 refills | Status: AC

## 2019-08-24 MED ORDER — LISINOPRIL-HYDROCHLOROTHIAZIDE 10-12.5 MG PO TABS: 1.0000 | ORAL_TABLET | Freq: Every day | ORAL | 3 refills | Status: DC

## 2019-08-24 MED ORDER — ACETAMINOPHEN 325 MG PO TABS: 650.0000 mg | ORAL_TABLET | Freq: Four times a day (QID) | ORAL | 0 refills | Status: AC | PRN

## 2019-08-24 MED ORDER — ALPRAZOLAM 0.25 MG PO TABS: 0.2500 mg | ORAL_TABLET | Freq: Every evening | ORAL | 0 refills | Status: DC | PRN

## 2019-08-24 MED ORDER — PANTOPRAZOLE SODIUM 40 MG PO TBEC: 40.0000 mg | DELAYED_RELEASE_TABLET | Freq: Every day | ORAL | 5 refills | Status: DC

## 2019-08-24 MED ORDER — ONDANSETRON HCL 4 MG PO TABS: 4.0000 mg | ORAL_TABLET | Freq: Four times a day (QID) | ORAL | 0 refills | Status: DC | PRN

## 2019-08-24 MED ORDER — RIVAROXABAN (XARELTO) VTE STARTER PACK (15 & 20 MG): ORAL_TABLET | ORAL | 0 refills | Status: DC

## 2019-08-24 MED ORDER — ROSUVASTATIN CALCIUM 20 MG PO TABS: 20.0000 mg | ORAL_TABLET | Freq: Every day | ORAL | 3 refills | Status: DC

## 2019-08-24 NOTE — Discharge Instructions (Signed)
- 1) take Xarelto the blood thinner as prescribed--complete the original starter pack through 09/21/2019 then on 09/22/2019 start Xarelto 20 mg daily after completing the original starter pack 2) you are taking Xarelto and Plavix which about blood thinners which increases your risk for bleeding so Avoid ibuprofen/Advil/Aleve/Motrin/Goody Powders/Naproxen/BC powders/Meloxicam/Diclofenac/Indomethacin and other Nonsteroidal anti-inflammatory medications as these will make you more likely to bleed and can cause stomach ulcers, can also cause Kidney problems.  3) follow-up with the primary care physician in 1 to 2 weeks for recheck and reevaluation, as well as for repeat CBC test and for  possible medication adjustment  Information on my medicine - XARELTO (rivaroxaban)  This medication education was reviewed with me or my healthcare representative as part of my discharge preparation.  The pharmacist that spoke with me during my hospital stay was:  Brain Hilts, [place]? Xarelto was prescribed to treat blood clots that may have been found in the veins of your legs (deep vein thrombosis) or in your lungs (pulmonary embolism) and to reduce the risk of them occurring again.  What do you need to know about Xarelto? The starting dose is one 15 mg tablet taken TWICE daily with food for the FIRST 21 DAYS then on Saturday 09/14/2019,  the dose is changed to one 20 mg tablet taken ONCE A DAY with your evening meal.  DO NOT stop taking Xarelto without talking to the health care provider who prescribed the medication.  Refill your prescription for 20 mg tablets before you run out.  After discharge, you should have regular check-up appointments with your healthcare provider that is prescribing your Xarelto.  In the future your dose may need to be changed if your kidney function changes by a significant amount.  What do you do if you miss a dose? If you are taking  Xarelto TWICE DAILY and you miss a dose, take it as soon as you remember. You may take two 15 mg tablets (total 30 mg) at the same time then resume your regularly scheduled 15 mg twice daily the next day.  If you are taking Xarelto ONCE DAILY and you miss a dose, take it as soon as you remember on the same day then continue your regularly scheduled once daily regimen the next day. Do not take two doses of Xarelto at the same time.   Important Safety Information Xarelto is a blood thinner medicine that can cause bleeding. You should call your healthcare provider right away if you experience any of the following: ? Bleeding from an injury or your nose that does not stop. ? Unusual colored urine (red or dark brown) or unusual colored stools (red or black). ? Unusual bruising for unknown reasons. ? A serious fall or if you hit your head (even if there is no bleeding).  Some medicines may interact with Xarelto and might increase your risk of bleeding while on Xarelto. To help avoid this, consult your healthcare provider or pharmacist prior to using any new prescription or non-prescription medications, including herbals, vitamins, non-steroidal anti-inflammatory drugs (NSAIDs) and supplements.  This website has more information on Xarelto: [email or web address]  - 1) take Xarelto the blood thinner as prescribed--complete the original starter pack through 09/21/2019 then on 09/22/2019 start Xarelto 20 mg daily after completing the original starter pack 2) you are taking Xarelto and Plavix which about blood thinners which increases your risk for bleeding so Avoid ibuprofen/Advil/Aleve/Motrin/Goody Powders/Naproxen/BC powders/Meloxicam/Diclofenac/Indomethacin and other Nonsteroidal anti-inflammatory medications as  these will make you more likely to bleed and can cause stomach ulcers, can also cause Kidney problems.  3) follow-up with the primary care physician in 1 to 2 weeks for recheck and reevaluation,  as well as for repeat CBC test and for  possible medication adjustment

## 2019-08-24 NOTE — Plan of Care (Signed)
  Problem: Education: Goal: Knowledge of General Education information will improve Description: Including pain rating scale, medication(s)/side effects and non-pharmacologic comfort measures Outcome: Completed/Met   Problem: Health Behavior/Discharge Planning: Goal: Ability to manage health-related needs will improve Outcome: Completed/Met   Problem: Clinical Measurements: Goal: Ability to maintain clinical measurements within normal limits will improve Outcome: Completed/Met Goal: Will remain free from infection Outcome: Completed/Met Goal: Diagnostic test results will improve Outcome: Completed/Met Goal: Respiratory complications will improve Outcome: Completed/Met Goal: Cardiovascular complication will be avoided Outcome: Completed/Met   Problem: Activity: Goal: Risk for activity intolerance will decrease Outcome: Completed/Met   Problem: Nutrition: Goal: Adequate nutrition will be maintained Outcome: Completed/Met   Problem: Coping: Goal: Level of anxiety will decrease Outcome: Completed/Met   Problem: Elimination: Goal: Will not experience complications related to bowel motility Outcome: Completed/Met Goal: Will not experience complications related to urinary retention Outcome: Completed/Met   Problem: Pain Managment: Goal: General experience of comfort will improve Outcome: Completed/Met   Problem: Safety: Goal: Ability to remain free from injury will improve Outcome: Completed/Met   Problem: Skin Integrity: Goal: Risk for impaired skin integrity will decrease Outcome: Completed/Met   Problem: Acute Rehab PT Goals(only PT should resolve) Goal: Pt Will Go Supine/Side To Sit Outcome: Completed/Met Goal: Patient Will Transfer Sit To/From Stand Outcome: Completed/Met Goal: Pt Will Ambulate Outcome: Completed/Met Goal: Pt Will Go Up/Down Stairs Outcome: Completed/Met   Problem: Acute Rehab OT Goals (only OT should resolve) Goal: Pt. Will Perform Lower  Body Dressing Outcome: Completed/Met Goal: Pt. Will Perform Tub/Shower Transfer Outcome: Completed/Met Goal: OT Additional ADL Goal #1 Outcome: Completed/Met    Discharge instructions reviewed with patient and wife.  These included, but were not limited to, the following:  medications and potential side effects (including Xarelto), when to call the MD, follow-up appointments, symptoms of worsening PE, activity recommendations, etc.  Patient discharged to home accompanied by wife.  Escorted to exit by wheelchair accompanied by nurse.

## 2019-08-24 NOTE — Evaluation (Signed)
Occupational Therapy Evaluation Patient Details Name: [name] MRN: [id number] DOB: 05/14/1946 Today's Date: 08/24/2019    History of Present Illness 73 y.o. male with medical history of coronary disease, hypertension, hyperlipidemia, anxiety, GI bleed presenting with 5-day history of substernal and left-sided chest pain.  EKG showed sinus rhythm with nonspecific T wave changes. CT angio which revealed saddle pulmonary embolism with evidence of right heart strain. Admitted 08/20/19 for treatment of pulmonary embolism and pulmonary nodules.    Clinical Impression   PTA patient independent. Admitted for above and limited by problem list below, including decreased activity tolerance and increased need for supplemental O2.  Patient currently able to complete ADLs with supervision, in room mobility with supervision given increased time.  He is on venturi mask at 6L 35% supplemental oxygen during session with saturations maintained 92-94%.  Patient will benefit from continued OT services in order to maximize independence/return to PLOF with ADLs utilizing energy conservation techniques.     Follow Up Recommendations  No OT follow up    Equipment Recommendations  None recommended by OT    Recommendations for Other Services       Precautions / Restrictions Precautions Precautions: None Restrictions Weight Bearing Restrictions: No      Mobility Bed Mobility               General bed mobility comments: OOB in recliner upon entry  Transfers Overall transfer level: Needs assistance Equipment used: None Transfers: Sit to/from Stand Sit to Stand: Supervision         General transfer comment: supervision for safety, good power up and steadying    Balance Overall balance assessment: Mild deficits observed, not formally tested                                         ADL either performed or assessed with clinical judgement   ADL Overall ADL's : Needs  assistance/impaired     Grooming: Supervision/safety       Lower Body Bathing: Supervison/ safety       Lower Body Dressing: Supervision/safety   Toilet Transfer: Supervision/safety   Toileting- Clothing Manipulation and Hygiene: Supervision/safety;Sit to/from stand       Functional mobility during ADLs: Supervision/safety General ADL Comments: superivison for safety, initated education on energy conservation techniques      Vision Baseline Vision/History: Wears glasses Wears Glasses: At all times Patient Visual Report: No change from baseline       Perception     Praxis      Pertinent Vitals/Pain Pain Assessment: No/denies pain     Hand Dominance     Extremity/Trunk Assessment Upper Extremity Assessment Upper Extremity Assessment: Overall WFL for tasks assessed   Lower Extremity Assessment Lower Extremity Assessment: Defer to PT evaluation   Cervical / Trunk Assessment Cervical / Trunk Assessment: Kyphotic   Communication Communication Communication: HOH   Cognition Arousal/Alertness: Awake/alert Behavior During Therapy: WFL for tasks assessed/performed Overall Cognitive Status: Within Functional Limits for tasks assessed                                     General Comments  Pt on 6L via venturi mask, SpO2 maintained 92-94% throughout session; HR and BP stable     Exercises     Shoulder Instructions  Home Living Family/patient expects to be discharged to:: Private residence Living Arrangements: Spouse/significant other   Type of Home: House Home Access: Stairs to enter [employer] of Steps: 4 Entrance Stairs-Rails: Right;Left Home Layout: One level     Bathroom Shower/Tub: Teacher, early years/pre: Handicapped height Bathroom Accessibility: Yes   Home Equipment: Cane - single point;Hand held shower head;Shower seat          Prior Functioning/Environment Level of Independence: Independent         Comments: was independent and doing yardwork         OT Problem List: Decreased activity tolerance;Decreased knowledge of use of DME or AE;Decreased knowledge of precautions;Cardiopulmonary status limiting activity      OT Treatment/Interventions: Self-care/ADL training;DME and/or AE instruction;Energy conservation;Therapeutic activities;Patient/family education    OT Goals(Current goals can be found in the care plan section) Acute Rehab OT Goals Patient Stated Goal: get back home OT Goal Formulation: With patient Time For Goal Achievement: 09/07/19 Potential to Achieve Goals: Good  OT Frequency: Min 2X/week   Barriers to D/C:            Co-evaluation              AM-PAC OT "6 Clicks" Daily Activity     Outcome Measure Help from another person eating meals?: A Little Help from another person taking care of personal grooming?: A Little Help from another person toileting, which includes using toliet, bedpan, or urinal?: A Little Help from another person bathing (including washing, rinsing, drying)?: A Little Help from another person to put on and taking off regular upper body clothing?: A Little Help from another person to put on and taking off regular lower body clothing?: A Little 6 Click Score: 18   End of Session Equipment Utilized During Treatment: Oxygen Nurse Communication: Mobility status  Activity Tolerance: Patient tolerated treatment well Patient left: in chair;with call bell/phone within reach  OT Visit Diagnosis: Other abnormalities of gait and mobility (R26.89);Other (comment)(decreased activity tolerance)                Time: [id number] OT Time Calculation (min): 26 min Charges:  OT General Charges $OT Visit: 1 Visit OT Evaluation $OT Eval Moderate Complexity: 1 Mod OT Treatments $Self Care/Home Management : 8-22 mins  Delight Stare, OT Acute Rehabilitation Services Pager [phone] Office [phone]   Delight Stare 08/24/2019, 10:17 AM

## 2019-08-24 NOTE — Discharge Summary (Signed)
[name], is a 73 y.o. male  DOB [birth year]  MRN [id number].  Admission date:  08/20/2019  Admitting Physician  [name], MD  Discharge Date:  08/24/2019   Primary MD  Rutherford Guys, MD  Recommendations for primary care physician for things to follow:   1) take Xarelto the blood thinner as prescribed--complete the original starter pack through 09/21/2019 then on 09/22/2019 start Xarelto 20 mg daily after completing the original starter pack 2) you are taking Xarelto and Plavix which about blood thinners which increases your risk for bleeding so Avoid ibuprofen/Advil/Aleve/Motrin/Goody Powders/Naproxen/BC powders/Meloxicam/Diclofenac/Indomethacin and other Nonsteroidal anti-inflammatory medications as these will make you more likely to bleed and can cause stomach ulcers, can also cause Kidney problems.  3) follow-up with the primary care physician in 1 to 2 weeks for recheck and reevaluation, as well as for repeat CBC test and for  possible medication adjustment  Admission Diagnosis  Acute respiratory failure with hypoxia ([place]) [J96.01] Community acquired pneumonia of left lower lobe of lung [J18.9] Acute pulmonary embolus ([place]) [I26.99]   Discharge Diagnosis  Acute respiratory failure with hypoxia ([place]) [J96.01] Community acquired pneumonia of left lower lobe of lung [J18.9] Acute pulmonary embolus ([place]) [I26.99]    Active Problems:   CAD in native artery   Essential hypertension   Acute respiratory failure with hypoxia ([place])   Lobar pneumonia ([place])   Acute pulmonary embolus ([place])   Pulmonary embolism ([place])      Past Medical History:  Diagnosis Date  . Arthritis   . CAD S/P percutaneous coronary angioplasty 2011   stents x 3  . Generalized anxiety disorder   . Hard of hearing   . Hyperlipidemia   . Hypertension   . Lower GI bleed 2014   colonscopy was normal  . 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 CORONARY ANGIOPLASTY WITH STENT PLACEMENT    . FRACTURE SURGERY  1995   right elbow  . KNEE ARTHROSCOPY W/ MENISCAL REPAIR       HPI  from the history and physical done on the day of admission:    - HPI:  [name] is a 73 y.o. male with medical history of coronary disease, hypertension, hyperlipidemia, anxiety, GI bleed presenting with 5-day history of substernal and left-sided chest pain.  The patient has also had an associated cough with yellow sputum.  He is also developed some blood-tinged sputum in the past 2 to 3 days.  On 08/19/2019, the patient developed worsening shortness of breath that has progressively worsened.  As result, he presented emergency department for the evaluation.  He had subjective fevers and chills, but when he took his temperature he was afebrile at home.  He states that his chest pain is worse with inspiration and coughing as well as with laying flat.  He states that his lower extremity edema has been about the same as usual.  He denies any orthopnea or PND type symptoms.  He has been compliant with all his medications.  He denies any recent travels or long trips.  He denies any nausea, vomiting, diarrhea, abdominal pain, hematochezia, melena. In the emergency department, the patient was afebrile and hemodynamically stable with sinus tachycardia in the 100s.  Oxygen saturation was initially 87% on room air.  He was placed on 2 L with improvement to 95%.  BMP and LFTs were essentially unremarkable.  WBC was 14.8 with hemoglobin 15.4 and platelets 218,000.  Initial troponin was 775>>> 653.  Lactic acid was 1.5>>> 1.1.  Chest x-ray showed left basilar opacity concerning for atelectasis versus pneumonia.  EKG showed sinus rhythm with nonspecific T wave changes.  BNP was 126     Hospital Course:    Brief Summary:- 73 y/o male presented11/10/2020with 5 day history of chest  painworse on inspiration, workup on admissionfound him to be hypoxic on RA, CTAwith revealed large saddle PE with extension in lobar branches   A/p 1)Saddle Embolus--- clinically continues to improve -transitioned from IV heparin to p.o. Xarelto,  -Echo with EF of 50 to 55% with grade 1 dCHF,  -Hypoxia resolved ---Post ambulation O2 sats 92% on room air  2)PulmonaryNodules Bilateral measuring up to 62mm.With no clear etiology for PE and his history of melanoma, would consider PET scan when he is more stable. -Will need outpatient pulmonary follow up for evaluation of pulmonary nodules  3)H/o CAD-status post prior angioplasty and stenting x2 in 2011--elevated troponin in the setting of PE, ACS less likely, continue Crestor and Plavix, stop aspirin as patient is full anticoagulation for PE with Xarelto  4)HTN--stable, continue  lisinopril/HCTZ  5)Acute hypoxic respiratory failure secondary to #1 above--- resolved , --Post ambulation O2 sats 92% on room air  disposition--home in stable condition  Code Status : Full code  Family Communication:     (patient is alert, awake and coherent) -Discussed with wife at bedside  Disposition Plan  : PT eval appreciated,  recommend no further rehab --Post ambulation O2 sats 92% on room air  Discharge Condition: stable  Follow UP--pulmonologist advised patient will need PET scan  Diet and Activity recommendation:  As advised  Discharge Instructions    Discharge Instructions    Call MD for:  difficulty breathing, headache or visual disturbances   Complete by: As directed    Call MD for:  persistant dizziness or light-headedness   Complete by: As directed    Call MD for:  persistant nausea and vomiting   Complete by: As directed    Call MD for:  severe uncontrolled pain   Complete by: As directed    Call MD for:  temperature >100.4   Complete by: As directed    Diet - low sodium heart healthy   Complete by: As directed     Discharge instructions   Complete by: As directed    1) take Xarelto the blood thinner as prescribed--complete the original starter pack through 09/21/2019 then on 09/22/2019 start Xarelto 20 mg daily after completing the original starter pack 2) you are taking Xarelto and Plavix which about blood thinners which increases your risk for bleeding so Avoid ibuprofen/Advil/Aleve/Motrin/Goody Powders/Naproxen/BC powders/Meloxicam/Diclofenac/Indomethacin and other Nonsteroidal anti-inflammatory medications as these will make you more likely to bleed and can cause stomach ulcers, can also cause Kidney problems.  3) follow-up with the primary care physician in 1 to 2 weeks for recheck and reevaluation, as well as for repeat CBC test and for  possible medication adjustment   Increase activity slowly  Complete by: As directed         Discharge Medications     Allergies as of 08/24/2019   No Known Allergies     Medication List    STOP taking these medications   aspirin EC 81 MG tablet   diphenhydrAMINE 25 MG tablet Commonly known as: BENADRYL   docusate sodium 100 MG capsule Commonly known as: COLACE   hydrochlorothiazide 12.5 MG capsule Commonly known as: MICROZIDE   lisinopril 20 MG tablet Commonly known as: ZESTRIL     TAKE these medications   acetaminophen 325 MG tablet Commonly known as: TYLENOL Take 2 tablets (650 mg total) by mouth every 6 (six) hours as needed for mild pain (or Fever >/= 101).   ALPRAZolam 0.25 MG tablet Commonly known as: XANAX Take 1 tablet (0.25 mg total) by mouth at bedtime as needed for anxiety.   calcium carbonate 500 MG chewable tablet Commonly known as: TUMS - dosed in mg elemental calcium Chew 2 tablets (400 mg of elemental calcium total) by mouth every 6 (six) hours as needed for indigestion or heartburn.   clopidogrel 75 MG tablet Commonly known as: PLAVIX Take 1 tablet (75 mg total) by mouth daily.   lisinopril-hydrochlorothiazide  10-12.5 MG tablet Commonly known as: Zestoretic Take 1 tablet by mouth daily. Start 08/26/2019 Start taking on: August 26, 2019   ondansetron 4 MG tablet Commonly known as: ZOFRAN Take 1 tablet (4 mg total) by mouth every 6 (six) hours as needed for nausea.   pantoprazole 40 MG tablet Commonly known as: Protonix Take 1 tablet (40 mg total) by mouth daily.   Rivaroxaban 15 & 20 MG Tbpk Follow package directions: Take one 15mg  tablet by mouth twice a day. On day 22, switch to one 20mg  tablet once a day. Take with food.   rivaroxaban 20 MG Tabs tablet Commonly known as: Xarelto Take 1 tablet (20 mg total) by mouth daily with supper. Start 09/22/19 after completing starter pack Start taking on: September 22, 2019   rosuvastatin 20 MG tablet Commonly known as: Crestor Take 1 tablet (20 mg total) by mouth daily.   senna-docusate 8.6-50 MG tablet Commonly known as: Senokot-S Take 2 tablets by mouth at bedtime.       Major procedures and Radiology Reports - PLEASE review detailed and final reports for all details, in brief -    Ct Angio Chest Pe W Or Wo Contrast  Addendum Date: 08/20/2019   ADDENDUM REPORT: 08/20/2019 16:53 ADDENDUM: These results were called by telephone at the time of interpretation on 08/20/2019 at 4:53 pm to provider DAVID TAT , who verbally acknowledged these results. Electronically Signed   By: Constance Holster M.D.   On: 08/20/2019 16:53   Result Date: 08/20/2019 CLINICAL DATA:  Dyspnea. Shortness of breath. Left-sided chest pain. EXAM: CT ANGIOGRAPHY CHEST WITH CONTRAST TECHNIQUE: Multidetector CT imaging of the chest was performed using the standard protocol during bolus administration of intravenous contrast. Multiplanar CT image reconstructions and MIPs were obtained to evaluate the vascular anatomy. CONTRAST:  160mL OMNIPAQUE IOHEXOL 350 MG/ML SOLN COMPARISON:  None. FINDINGS: Cardiovascular: Contrast injection is sufficient to demonstrate satisfactory  opacification of the pulmonary arteries to the segmental level.There is a saddle pulmonary embolism with extension into the lobar branches bilaterally. Essentially all lobes bilaterally are involved. There is further extension into the segmental and subsegmental branches bilaterally. There is CT evidence for right-sided heart strain with an RV LV ratio measuring approximately 1.36. There are atherosclerotic changes  of the thoracic aorta. Coronary artery calcifications are noted. The heart is not significantly enlarged. Mediastinum/Nodes: --No mediastinal or hilar lymphadenopathy. --No axillary lymphadenopathy. --No supraclavicular lymphadenopathy. --Normal thyroid gland. --The esophagus is unremarkable Lungs/Pleura: There is a 5 mm pulmonary nodule in the right middle lobe (axial series 6, image 103). There is a 6 mm pulmonary nodule in the left lower lobe (axial series 6, image 77). There are diffuse ground-glass airspace opacities in the left lower lobe. There is a trace left-sided pleural effusion. There is a small amount of atelectasis at the left lung base. The trachea is unremarkable. There is no large pneumothorax. Upper Abdomen: There are geographic attenuation differences throughout the right hepatic lobe favored to be secondary to heterogeneous fatty infiltration of the liver. There is no discrete hepatic mass. There is diverticulosis without CT evidence for diverticulitis involving the visualized portions of the colon. Musculoskeletal: No chest wall abnormality. No acute or significant osseous findings. Review of the MIP images confirms the above findings. IMPRESSION: 1. Large saddle pulmonary embolism with extension into the lobar branches bilaterally. There is CT evidence of right-sided heart strain with the RV/LV ratio measuring approximately 1.36. 2. Ground-glass airspace opacity in the left lower lobe favored to represent an evolving pulmonary infarct in the setting of extensive acute pulmonary  emboli. An underlying infectious process is not entirely excluded. 3. Trace left-sided pleural effusion. 4. Bilateral pulmonary nodules measuring up to 6 mm. Non-contrast chest CT at 3-6 months is recommended. If the nodules are stable at time of repeat CT, then future CT at 18-24 months (from today's scan) is considered optional for low-risk patients, but is recommended for high-risk patients. This recommendation follows the consensus statement: Guidelines for Management of Incidental Pulmonary Nodules Detected on CT Images: From the Fleischner Society 2017; Radiology 2017; 284:228-243. 5. Hepatic steatosis. Electronically Signed: By: Constance Holster M.D. On: 08/20/2019 16:48   Dg Chest Port 1 View  Result Date: 08/22/2019 CLINICAL DATA:  Pulmonary embolism. EXAM: PORTABLE CHEST 1 VIEW COMPARISON:  August 20, 2019. FINDINGS: Stable cardiomediastinal silhouette. No pneumothorax is noted. Minimal right basilar subsegmental atelectasis is noted. Stable left basilar atelectasis or infiltrate is noted with associated pleural effusion. Bony thorax is unremarkable. IMPRESSION: Stable left basilar opacity as described above. Electronically Signed   By: Marijo Conception M.D.   On: 08/22/2019 07:28   Dg Chest Port 1 View  Result Date: 08/20/2019 CLINICAL DATA:  Cough, left sided chest pain. Additional history provided: History of 3 cardiac stents. Weakness, coughing with blood tinged sputum. EXAM: PORTABLE CHEST 1 VIEW COMPARISON:  No pertinent prior studies available for comparison. FINDINGS: The left heart border is partially obscured, limiting evaluation of heart size. Left basilar opacity consistent with atelectasis and/or pneumonia as well as left pleural effusion. Mild linear atelectasis within the right lung base. No evidence of pneumothorax. No acute bony abnormality. IMPRESSION: Atelectasis and/or pneumonia as well as left pleural effusion at the left lung base. Electronically Signed   By: Kellie Simmering  DO   On: 08/20/2019 12:54    Micro Results    Recent Results (from the past 240 hour(s))  SARS CORONAVIRUS 2 (TAT 6-24 HRS) Nasopharyngeal Nasopharyngeal Swab     Status: None   Collection Time: 08/20/19 11:13 AM   Specimen: Nasopharyngeal Swab  Result Value Ref Range Status   SARS Coronavirus 2 NEGATIVE NEGATIVE Final    Comment: (NOTE) SARS-CoV-2 target nucleic acids are NOT DETECTED. The SARS-CoV-2 RNA is generally detectable in  upper and lower respiratory specimens during the acute phase of infection. Negative results do not preclude SARS-CoV-2 infection, do not rule out co-infections with other pathogens, and should not be used as the sole basis for treatment or other patient management decisions. Negative results must be combined with clinical observations, patient history, and epidemiological information. The expected result is Negative. Fact Sheet for Patients: SugarRoll.be Fact Sheet for Healthcare Providers: https://www.woods-mathews.com/ This test is not yet approved or cleared by the [place] FDA and  has been authorized for detection and/or diagnosis of SARS-CoV-2 by FDA under an Emergency Use Authorization (EUA). This EUA will remain  in effect (meaning this test can be used) for the duration of the COVID-19 declaration under Section 56 4(b)(1) of the Act, 21 U.S.C. section 360bbb-3(b)(1), unless the authorization is terminated or revoked sooner. Performed at [facility], [place] [address], [place], [place] [address]   Blood Culture (routine x 2)     Status: None (Preliminary result)   Collection Time: 08/20/19 12:10 PM   Specimen: BLOOD LEFT ARM  Result Value Ref Range Status   Specimen Description BLOOD LEFT ARM  Final   Special Requests   Final    BOTTLES DRAWN AEROBIC AND ANAEROBIC Blood Culture adequate volume   Culture   Final    NO GROWTH 4 DAYS Performed at [facility], [address]  [place], [place] [address]    Report Status PENDING  Incomplete  Blood Culture (routine x 2)     Status: None (Preliminary result)   Collection Time: 08/20/19 12:25 PM   Specimen: BLOOD LEFT HAND  Result Value Ref Range Status   Specimen Description BLOOD LEFT HAND  Final   Special Requests   Final    BOTTLES DRAWN AEROBIC AND ANAEROBIC Blood Culture results may not be optimal due to an inadequate volume of blood received in culture bottles   Culture   Final    NO GROWTH 4 DAYS Performed at [facility], [address] [place], [place] [address]    Report Status PENDING  Incomplete  MRSA PCR Screening     Status: None   Collection Time: 08/20/19  9:25 PM   Specimen: Nasopharyngeal  Result Value Ref Range Status   MRSA by PCR NEGATIVE NEGATIVE Final    Comment:        The GeneXpert MRSA Assay (FDA approved for NASAL specimens only), is one component of a comprehensive MRSA colonization surveillance program. It is not intended to diagnose MRSA infection nor to guide or monitor treatment for MRSA infections. Performed at [facility], [place] [address], [place], [place] [address]        Today   Subjective    [name] today has no new complaints  -No dyspnea at rest -Fatigue and dyspnea on exertion improved significantly, post, +2/above 92% on room air           Patient has been seen and examined prior to discharge   Objective   Blood pressure 126/87, pulse 61, temperature 98.9 F (37.2 C), temperature source Oral, resp. rate 15, height 6\' 2"  (1.88 m), weight 129.7 kg, SpO2 94 %.   Intake/Output Summary (Last 24 hours) at 08/24/2019 1357 Last data filed at 08/24/2019 0900 Gross per 24 hour  Intake 320 ml  Output 600 ml  Net -280 ml    Exam Gen:- Awake Alert,  obesity HEENT:- [place].AT, No sclera icterus 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  Lungs-diminished breath sounds, no wheezing  CV- S1, S2 normal, regular  Abd-  +ve  B.Sounds, Abd Soft, No tenderness,    Extremity/Skin:- No  edema, pedal pulses present  Psych-affect is appropriate, oriented x3 Neuro- no new focal deficits, no tremors   Data Review   CBC w Diff:  Lab Results  Component Value Date   WBC 9.5 08/24/2019   HGB 14.4 08/24/2019   HCT 43.7 08/24/2019   PLT 284 08/24/2019   LYMPHOPCT 6 08/20/2019   MONOPCT 11 08/20/2019   EOSPCT 0 08/20/2019   BASOPCT 0 08/20/2019    CMP:  Lab Results  Component Value Date   NA 140 08/23/2019   NA 141 04/18/2019   K 4.3 08/23/2019   CL 102 08/23/2019   CO2 28 08/23/2019   BUN 26 (H) 08/23/2019   BUN 21 04/18/2019   CREATININE 1.08 08/23/2019   CREATININE 1.05 11/21/2017   PROT 7.8 08/20/2019   PROT 6.7 04/18/2019   ALBUMIN 3.8 08/20/2019   ALBUMIN 4.3 04/18/2019   BILITOT 1.2 08/20/2019   BILITOT 0.5 04/18/2019   ALKPHOS 74 08/20/2019   AST 26 08/20/2019   ALT 30 08/20/2019  .   Total Discharge time is about 33 minutes  Roxan Hockey M.D on 08/24/2019 at 1:57 PM  Go to [email or web address] -  for contact info  [facility] - Office  [phone]

## 2019-08-24 NOTE — Care Management (Signed)
Patient copay for Xarelto is $460.  D/W Dr. Denton Brick.  Patient given 30 day coupon.  Patient understands cost and states will discuss with PCP if unable to afford.  Will try to change prescription drug plans for better coverage.

## 2019-08-25 LAB — CULTURE, BLOOD (ROUTINE X 2)
Culture: NO GROWTH
Culture: NO GROWTH
Special Requests: ADEQUATE

## 2019-08-25 LAB — LEGIONELLA PNEUMOPHILA SEROGP 1 UR AG: L. pneumophila Serogp 1 Ur Ag: NEGATIVE

## 2019-09-02 ENCOUNTER — Other Ambulatory Visit: Payer: Self-pay

## 2019-09-02 ENCOUNTER — Encounter: Payer: Self-pay | Admitting: Family Medicine

## 2019-09-02 ENCOUNTER — Ambulatory Visit (INDEPENDENT_AMBULATORY_CARE_PROVIDER_SITE_OTHER): Payer: Medicare Other | Admitting: Family Medicine

## 2019-09-02 VITALS — BP 136/69 | HR 94 | Temp 98.6°F | Resp 12 | Ht 74.0 in | Wt 283.0 lb

## 2019-09-02 DIAGNOSIS — R351 Nocturia: Secondary | ICD-10-CM | POA: Diagnosis not present

## 2019-09-02 DIAGNOSIS — Z7901 Long term (current) use of anticoagulants: Secondary | ICD-10-CM

## 2019-09-02 DIAGNOSIS — I2692 Saddle embolus of pulmonary artery without acute cor pulmonale: Secondary | ICD-10-CM | POA: Diagnosis not present

## 2019-09-02 DIAGNOSIS — Z125 Encounter for screening for malignant neoplasm of prostate: Secondary | ICD-10-CM | POA: Diagnosis not present

## 2019-09-02 DIAGNOSIS — R918 Other nonspecific abnormal finding of lung field: Secondary | ICD-10-CM | POA: Diagnosis not present

## 2019-09-02 DIAGNOSIS — N401 Enlarged prostate with lower urinary tract symptoms: Secondary | ICD-10-CM

## 2019-09-02 MED ORDER — CLOPIDOGREL BISULFATE 75 MG PO TABS: 75.0000 mg | ORAL_TABLET | Freq: Every day | ORAL | 3 refills | Status: DC

## 2019-09-02 MED ORDER — RIVAROXABAN 20 MG PO TABS: 20.0000 mg | ORAL_TABLET | Freq: Every day | ORAL | 5 refills | Status: DC

## 2019-09-02 NOTE — Progress Notes (Signed)
11/23/20204:21 PM  [name] 08/09/1946, 73 y.o., male [id number]  Chief Complaint  Patient presents with  . Hospitalization Follow-up    Pt stated--went the hospital for blood clot (lungs)--and today coughing drainage mixed with blood.    HPI:   Patient is a 73 y.o. male with past medical history significant for CADs/p stents, HTN and HLP, hard of hearingwho presents today for hosp followup  Hosp from nov 10th to 14th for saddle PE, LLL PNA Started xeralto, cont plavix given CAD/stent Echo: LVEF 50-55%, no LV hypertrophy, G1DD, globally mild decrease in  RV systolic function Smoked for several years in his late teenage years, quit at age 33 CT scan shows incidental bilateral lung nodules - recommend repeat CT scan in 3-6 months Recommend recheck CBC at PCP appt Father had blood clots in his 39s provoked, no other fhx  Last colonoscopy in 2015 for GIB, no polyps Had prostate biopsy for abnormal prostate on DRE before 2015 Patient is doing much better Breathing back to normal, he does cont to sleep in his recliner, easier to get out of bed, nocturia x 2-3 Very mild cp with deep breathings. Cough improving, cont with very mild intermittent hemoptysis Denies any abnormal bleeding No fever or chills Using a cane for balance  Lab Results  Component Value Date   WBC 9.5 08/24/2019   HGB 14.4 08/24/2019   HCT 43.7 08/24/2019   MCV 87.1 08/24/2019   PLT 284 08/24/2019   Lab Results  Component Value Date   CREATININE 1.08 08/23/2019   BUN 26 (H) 08/23/2019   NA 140 08/23/2019   K 4.3 08/23/2019   CL 102 08/23/2019   CO2 28 08/23/2019    Depression screen PHQ 2/9 09/02/2019 04/18/2019 10/12/2018  Decreased Interest 0 0 0  Down, Depressed, Hopeless 0 0 0  PHQ - 2 Score 0 0 0    Fall Risk  09/02/2019 04/18/2019 10/12/2018 04/10/2018 12/06/2017  Falls in the past year? 0 0 0 No No  Number falls in past yr: 0 - - - -  Injury with Fall? 0 - - - -  Follow up - Falls  evaluation completed - - -     No Known Allergies  Prior to Admission medications   Medication Sig Start Date End Date Taking? Authorizing Provider  acetaminophen (TYLENOL) 325 MG tablet Take 2 tablets (650 mg total) by mouth every 6 (six) hours as needed for mild pain (or Fever >/= 101). 08/24/19  Yes Emokpae, Courage, MD  ALPRAZolam (XANAX) 0.25 MG tablet Take 1 tablet (0.25 mg total) by mouth at bedtime as needed for anxiety. 08/24/19  Yes Roxan Hockey, MD  calcium carbonate (TUMS - DOSED IN MG ELEMENTAL CALCIUM) 500 MG chewable tablet Chew 2 tablets (400 mg of elemental calcium total) by mouth every 6 (six) hours as needed for indigestion or heartburn. 08/24/19  Yes Emokpae, Courage, MD  clopidogrel (PLAVIX) 75 MG tablet Take 1 tablet (75 mg total) by mouth daily. 08/24/19  Yes Emokpae, Courage, MD  lisinopril-hydrochlorothiazide (ZESTORETIC) 10-12.5 MG tablet Take 1 tablet by mouth daily. Start 08/26/2019 08/26/19 08/25/20 Yes Emokpae, Courage, MD  ondansetron (ZOFRAN) 4 MG tablet Take 1 tablet (4 mg total) by mouth every 6 (six) hours as needed for nausea. 08/24/19  Yes Emokpae, Courage, MD  pantoprazole (PROTONIX) 40 MG tablet Take 1 tablet (40 mg total) by mouth daily. 08/24/19 08/23/20 Yes Roxan Hockey, MD  Rivaroxaban 15 & 20 MG TBPK Follow package directions: Take one  15mg  tablet by mouth twice a day. On day 22, switch to one 20mg  tablet once a day. Take with food. 08/24/19  Yes Emokpae, Courage, MD  rosuvastatin (CRESTOR) 20 MG tablet Take 1 tablet (20 mg total) by mouth daily. 08/24/19  Yes Emokpae, Courage, MD  senna-docusate (SENOKOT-S) 8.6-50 MG tablet Take 2 tablets by mouth at bedtime. 08/24/19 08/23/20 Yes Emokpae, Courage, MD  rivaroxaban (XARELTO) 20 MG TABS tablet Take 1 tablet (20 mg total) by mouth daily with supper. Start 09/22/19 after completing starter pack Patient not taking: Reported on 09/02/2019 09/22/19   Roxan Hockey, MD    Past Medical History:   Diagnosis Date  . Arthritis   . CAD S/P percutaneous coronary angioplasty 2011   stents x 3  . Generalized anxiety disorder   . Hard of hearing   . Hyperlipidemia   . Hypertension   . Lower GI bleed 2014   colonscopy was normal  . 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 CORONARY ANGIOPLASTY WITH STENT PLACEMENT    . FRACTURE SURGERY  1995   right elbow  . KNEE ARTHROSCOPY W/ MENISCAL REPAIR      Social History   Tobacco Use  . Smoking status: Former Smoker    Packs/day: 0.30    Types: Cigarettes    Start date: 08/31/1966    Quit date: 08/31/1969    Years since quitting: 50.0  . Smokeless tobacco: Never Used  Substance Use Topics  . Alcohol use: Yes    Alcohol/week: 3.0 standard drinks    Types: 2 Cans of beer, 1 Shots of liquor per week    Family History  Problem Relation Age of Onset  . Heart disease Mother 71       heart attack  . Lung disease Father        asbestosis  . Clotting disorder Father   . Asthma Daughter   . Hyperlipidemia Daughter   . Alcohol abuse Paternal Uncle   . Cancer Paternal Grandfather        lung    Review of Systems  Constitutional: Negative for chills and fever.  Respiratory: Positive for cough and hemoptysis. Negative for shortness of breath and wheezing.   Cardiovascular: Positive for chest pain. Negative for palpitations and leg swelling.  Gastrointestinal: Negative for abdominal pain, nausea and vomiting.   Per hpi  OBJECTIVE:  Today's Vitals   09/02/19 1600  BP: 136/69  Pulse: 94  Resp: 12  Temp: 98.6 F (37 C)  SpO2: 94%  Weight: 283 lb (128.4 kg)  Height: 6\' 2"  ([id number] m)   Body mass index is 36.34 kg/m.   Physical Exam Vitals signs and nursing note reviewed.  Constitutional:      Appearance: He is well-developed.  HENT:     Head: Normocephalic and atraumatic.  Eyes:     Conjunctiva/sclera: Conjunctivae normal.     Pupils: Pupils are equal,  round, and reactive to light.  Neck:     Musculoskeletal: Neck supple.  Cardiovascular:     Rate and Rhythm: Normal rate and regular rhythm.     Heart sounds: No murmur. No friction rub. No gallop.   Pulmonary:     Effort: Pulmonary effort is normal.     Breath sounds: Normal breath sounds. No wheezing or rales.  Musculoskeletal:     Right lower leg: No edema.     Left lower leg: No edema.  Skin:  General: Skin is warm and dry.  Neurological:     Mental Status: He is alert and oriented to person, place, and time.     No results found for this or any previous visit (from the past 24 hour(s)).  No results found.   ASSESSMENT and PLAN  1. Acute saddle pulmonary embolism without acute cor pulmonale (HCC) 2. Current use of long term anticoagulation Doing well. Unprovoked. At least on 6 months of anticoagulation. Reviewed bleeding risk and RTC precautions. - CBC  3. Screening for prostate cancer 4. BPH associated with nocturia - PSA  5. Pulmonary nodules/lesions, multiple 6. Abnormal findings on diagnostic imagining of lung - ct chest wo contrast, future 6 months   Other orders - clopidogrel (PLAVIX) 75 MG tablet; Take 1 tablet (75 mg total) by mouth daily. - rivaroxaban (XARELTO) 20 MG TABS tablet; Take 1 tablet (20 mg total) by mouth daily with supper. Start 09/22/19 after completing starter pack  Return in about 3 months (around 12/03/2019).    Rutherford Guys, MD Primary Care at [place], [place] [address] Ph.  [phone] Fax [phone]

## 2019-09-02 NOTE — Patient Instructions (Signed)
° ° ° °  If you have lab work done today you will be contacted with your lab results within the next 2 weeks.  If you have not heard from us then please contact us. The fastest way to get your results is to register for My Chart. ° ° °IF you received an x-ray today, you will receive an invoice from [facility]. Please contact [facility] at [phone] with questions or concerns regarding your invoice.  ° °IF you received labwork today, you will receive an invoice from [facility]. Please contact [facility] at [phone] with questions or concerns regarding your invoice.  ° °Our billing staff will not be able to assist you with questions regarding bills from these companies. ° °You will be contacted with the lab results as soon as they are available. The fastest way to get your results is to activate your My Chart account. Instructions are located on the last page of this paperwork. If you have not heard from us regarding the results in 2 weeks, please contact this office. °  ° ° ° °

## 2019-09-03 LAB — CBC
Hematocrit: 44.5 % (ref 37.5–51.0)
Hemoglobin: 14.8 g/dL (ref 13.0–17.7)
MCH: 28.8 pg (ref 26.6–33.0)
MCHC: 33.3 g/dL (ref 31.5–35.7)
MCV: 87 fL (ref 79–97)
Platelets: 411 10*3/uL (ref 150–450)
RBC: 5.13 x10E6/uL (ref 4.14–5.80)
RDW: 13.3 % (ref 11.6–15.4)
WBC: 9.7 10*3/uL (ref 3.4–10.8)

## 2019-09-03 LAB — PSA: Prostate Specific Ag, Serum: 1.2 ng/mL (ref 0.0–4.0)

## 2019-10-17 ENCOUNTER — Ambulatory Visit: Payer: Medicare Other | Admitting: Family Medicine

## 2019-10-25 ENCOUNTER — Encounter: Payer: Self-pay | Admitting: Family Medicine

## 2019-11-21 ENCOUNTER — Other Ambulatory Visit: Payer: Self-pay | Admitting: Family Medicine

## 2019-11-26 ENCOUNTER — Encounter: Payer: Self-pay | Admitting: Family Medicine

## 2019-11-26 ENCOUNTER — Other Ambulatory Visit: Payer: Self-pay

## 2019-11-26 ENCOUNTER — Ambulatory Visit (INDEPENDENT_AMBULATORY_CARE_PROVIDER_SITE_OTHER): Payer: Medicare Other | Admitting: Family Medicine

## 2019-11-26 VITALS — BP 126/76 | HR 68 | Temp 97.6°F | Resp 16 | Ht 74.0 in | Wt 289.0 lb

## 2019-11-26 DIAGNOSIS — F4322 Adjustment disorder with anxiety: Secondary | ICD-10-CM

## 2019-11-26 DIAGNOSIS — I2692 Saddle embolus of pulmonary artery without acute cor pulmonale: Secondary | ICD-10-CM

## 2019-11-26 DIAGNOSIS — E785 Hyperlipidemia, unspecified: Secondary | ICD-10-CM | POA: Diagnosis not present

## 2019-11-26 DIAGNOSIS — I10 Essential (primary) hypertension: Secondary | ICD-10-CM

## 2019-11-26 DIAGNOSIS — I2782 Chronic pulmonary embolism: Secondary | ICD-10-CM

## 2019-11-26 MED ORDER — PANTOPRAZOLE SODIUM 40 MG PO TBEC: 40.0000 mg | DELAYED_RELEASE_TABLET | Freq: Every day | ORAL | 5 refills | Status: DC

## 2019-11-26 MED ORDER — LISINOPRIL-HYDROCHLOROTHIAZIDE 10-12.5 MG PO TABS: 1.0000 | ORAL_TABLET | Freq: Every day | ORAL | 1 refills | Status: DC

## 2019-11-26 MED ORDER — ALPRAZOLAM 0.25 MG PO TABS: 0.2500 mg | ORAL_TABLET | Freq: Every evening | ORAL | 1 refills | Status: DC | PRN

## 2019-11-26 NOTE — Patient Instructions (Signed)
° ° ° °  If you have lab work done today you will be contacted with your lab results within the next 2 weeks.  If you have not heard from us then please contact us. The fastest way to get your results is to register for My Chart. ° ° °IF you received an x-ray today, you will receive an invoice from [facility]. Please contact [facility] at [phone] with questions or concerns regarding your invoice.  ° °IF you received labwork today, you will receive an invoice from [facility]. Please contact [facility] at [phone] with questions or concerns regarding your invoice.  ° °Our billing staff will not be able to assist you with questions regarding bills from these companies. ° °You will be contacted with the lab results as soon as they are available. The fastest way to get your results is to activate your My Chart account. Instructions are located on the last page of this paperwork. If you have not heard from us regarding the results in 2 weeks, please contact this office. °  ° ° ° °

## 2019-11-26 NOTE — Progress Notes (Signed)
2/16/202111:26 AM  [name] 07/30/1946, 74 y.o., male [id number]  Chief Complaint  Patient presents with  . Follow-up    HPI:   Patient is a 74 y.o. male with past medical history significant for CADs/p stents, HTN and HLP, hard of hearing, saddle PEwho presents today for routine followup  Last OV nov 2020 - no changes Patient reports overall he is doing well and has no acute concerns today Taking all meds as prescribed, denies any side effects Has been on xeralto for 3 months He has 2 teenage granddaughters that have moved in to live with them in December. Overall they are adjusting well. Uses xanax very prn for sleep with his anxiety sign increased pmp reviewed 15 tabs filled In nov, still has 3 left Uses cane  Depression screen [facility] 2/9 11/26/2019 09/02/2019 04/18/2019  Decreased Interest 0 0 0  Down, Depressed, Hopeless 0 0 0  PHQ - 2 Score 0 0 0    Fall Risk  11/26/2019 09/02/2019 04/18/2019 10/12/2018 04/10/2018  Falls in the past year? 0 0 0 0 No  Number falls in past yr: 0 0 - - -  Injury with Fall? 0 0 - - -  Follow up - - Falls evaluation completed - -     No Known Allergies  Prior to Admission medications   Medication Sig Start Date End Date Taking? Authorizing Provider  ALPRAZolam (XANAX) 0.25 MG tablet Take 1 tablet (0.25 mg total) by mouth at bedtime as needed for anxiety. 08/24/19  Yes Emokpae, Courage, MD  clopidogrel (PLAVIX) 75 MG tablet Take 1 tablet (75 mg total) by mouth daily. 09/02/19  Yes Rutherford Guys, MD  lisinopril-hydrochlorothiazide (ZESTORETIC) 10-12.5 MG tablet Take 1 tablet by mouth daily. Start 08/26/2019 08/26/19 08/25/20 Yes Emokpae, Courage, MD  pantoprazole (PROTONIX) 40 MG tablet Take 1 tablet (40 mg total) by mouth daily. 08/24/19 08/23/20 Yes Emokpae, Courage, MD  rosuvastatin (CRESTOR) 20 MG tablet Take 1 tablet (20 mg total) by mouth daily. 08/24/19  Yes Emokpae, Courage, MD  senna-docusate (SENOKOT-S) 8.6-50 MG tablet Take 2  tablets by mouth at bedtime. 08/24/19 08/23/20 Yes Emokpae, Courage, MD  XARELTO 20 MG TABS tablet TAKE 1 TABLET BY MOUTH DAILY WITH SUPPER. START 09/22/19 AFTER COMPLETING STARTER PACK 11/21/19  Yes Rutherford Guys, MD  acetaminophen (TYLENOL) 325 MG tablet Take 2 tablets (650 mg total) by mouth every 6 (six) hours as needed for mild pain (or Fever >/= 101). 08/24/19   Roxan Hockey, MD  calcium carbonate (TUMS - DOSED IN MG ELEMENTAL CALCIUM) 500 MG chewable tablet Chew 2 tablets (400 mg of elemental calcium total) by mouth every 6 (six) hours as needed for indigestion or heartburn. Patient not taking: Reported on 11/26/2019 08/24/19   Roxan Hockey, MD  ondansetron (ZOFRAN) 4 MG tablet Take 1 tablet (4 mg total) by mouth every 6 (six) hours as needed for nausea. 08/24/19   Roxan Hockey, MD  Rivaroxaban 15 & 20 MG TBPK Follow package directions: Take one 15mg  tablet by mouth twice a day. On day 22, switch to one 20mg  tablet once a day. Take with food. 08/24/19   Roxan Hockey, MD    Past Medical History:  Diagnosis Date  . Arthritis   . CAD S/P percutaneous coronary angioplasty 2011   stents x 3  . Generalized anxiety disorder   . Hard of hearing   . Hyperlipidemia   . Hypertension   . Lower GI bleed 2014   colonscopy was normal  .  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 CORONARY ANGIOPLASTY WITH STENT PLACEMENT    . FRACTURE SURGERY  1995   right elbow  . KNEE ARTHROSCOPY W/ MENISCAL REPAIR      Social History   Tobacco Use  . Smoking status: Former Smoker    Packs/day: 0.30    Types: Cigarettes    Start date: 08/31/1966    Quit date: 08/31/1969    Years since quitting: 50.2  . Smokeless tobacco: Never Used  Substance Use Topics  . Alcohol use: Yes    Alcohol/week: 3.0 standard drinks    Types: 2 Cans of beer, 1 Shots of liquor per week    Family History  Problem Relation Age of Onset  . Heart  disease Mother 73       heart attack  . Lung disease Father        asbestosis  . Clotting disorder Father   . Asthma Daughter   . Hyperlipidemia Daughter   . Alcohol abuse Paternal Uncle   . Cancer Paternal Grandfather        lung    Review of Systems  Constitutional: Negative for chills and fever.  Respiratory: Negative for cough and shortness of breath.   Cardiovascular: Negative for chest pain, palpitations and leg swelling.  Gastrointestinal: Negative for abdominal pain, nausea and vomiting.  Endo/Heme/Allergies: Does not bruise/bleed easily.   Per hpi  OBJECTIVE:  Today's Vitals   11/26/19 1105  BP: 126/76  Pulse: 68  Resp: 16  Temp: 97.6 F (36.4 C)  SpO2: 98%  Weight: 289 lb (131.1 kg)  Height: 6\' 2"  (1.88 m)   Body mass index is 37.11 kg/m.   Physical Exam Vitals and nursing note reviewed.  Constitutional:      Appearance: He is well-developed.  HENT:     Head: Normocephalic and atraumatic.  Eyes:     Conjunctiva/sclera: Conjunctivae normal.     Pupils: Pupils are equal, round, and reactive to light.  Cardiovascular:     Rate and Rhythm: Normal rate and regular rhythm.     Heart sounds: No murmur. No friction rub. No gallop.   Pulmonary:     Effort: Pulmonary effort is normal.     Breath sounds: Normal breath sounds. No wheezing or rales.  Musculoskeletal:     Cervical back: Neck supple.     Right lower leg: No edema.     Left lower leg: No edema.  Skin:    General: Skin is warm and dry.  Neurological:     Mental Status: He is alert and oriented to person, place, and time.     No results found for this or any previous visit (from the past 24 hour(s)).  No results found.   ASSESSMENT and PLAN  1. Essential hypertension Controlled. Continue current regime.   2. Hyperlipidemia, unspecified hyperlipidemia type Checking labs today, medications will be adjusted as needed.  - Comprehensive metabolic panel - Lipid panel  3. 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 On xeralto and asymptomatic. Will repeat CTA prior to d/c of anticoagulation  4. Adjustment disorder with anxiety Controlled. Continue current regime.   Other orders - pantoprazole (PROTONIX) 40 MG tablet; Take 1 tablet (40 mg total) by mouth daily. - lisinopril-hydrochlorothiazide (ZESTORETIC) 10-12.5 MG tablet; Take 1 tablet by mouth daily. Start 08/26/2019 - ALPRAZolam (XANAX) 0.25 MG tablet; Take 1 tablet (0.25 mg total) by mouth  at bedtime as needed for anxiety.  Return in about 3 months (around 02/23/2020).    Rutherford Guys, MD Primary Care at [place], [place] [address] Ph.  [phone] Fax [phone]

## 2019-11-27 LAB — COMPREHENSIVE METABOLIC PANEL
ALT: 22 IU/L (ref 0–44)
AST: 17 IU/L (ref 0–40)
Albumin/Globulin Ratio: 1.8 (ref 1.2–2.2)
Albumin: 4.4 g/dL (ref 3.7–4.7)
Alkaline Phosphatase: 63 IU/L (ref 39–117)
BUN/Creatinine Ratio: 21 (ref 10–24)
BUN: 20 mg/dL (ref 8–27)
Bilirubin Total: 0.4 mg/dL (ref 0.0–1.2)
CO2: 23 mmol/L (ref 20–29)
Calcium: 9.8 mg/dL (ref 8.6–10.2)
Chloride: 104 mmol/L (ref 96–106)
Creatinine, Ser: 0.97 mg/dL (ref 0.76–1.27)
GFR calc Af Amer: 90 mL/min/{1.73_m2} (ref >59)
GFR calc non Af Amer: 78 mL/min/{1.73_m2} (ref >59)
Globulin, Total: 2.4 g/dL (ref 1.5–4.5)
Glucose: 98 mg/dL (ref 65–99)
Potassium: 4.2 mmol/L (ref 3.5–5.2)
Sodium: 140 mmol/L (ref 134–144)
Total Protein: 6.8 g/dL (ref 6.0–8.5)

## 2019-11-27 LAB — LIPID PANEL
Chol/HDL Ratio: 2.6 ratio (ref 0.0–5.0)
Cholesterol, Total: 143 mg/dL (ref 100–199)
HDL: 54 mg/dL (ref >39)
LDL Chol Calc (NIH): 73 mg/dL (ref 0–99)
Triglycerides: 85 mg/dL (ref 0–149)
VLDL Cholesterol Cal: 16 mg/dL (ref 5–40)

## 2019-12-03 ENCOUNTER — Ambulatory Visit: Payer: Medicare Other | Admitting: Family Medicine

## 2019-12-06 ENCOUNTER — Ambulatory Visit (INDEPENDENT_AMBULATORY_CARE_PROVIDER_SITE_OTHER): Payer: Medicare Other | Admitting: Internal Medicine

## 2019-12-06 ENCOUNTER — Encounter: Payer: Self-pay | Admitting: Internal Medicine

## 2019-12-06 ENCOUNTER — Other Ambulatory Visit: Payer: Self-pay

## 2019-12-06 ENCOUNTER — Telehealth: Payer: Self-pay | Admitting: Internal Medicine

## 2019-12-06 VITALS — BP 138/82 | HR 59 | Ht 74.0 in | Wt 289.0 lb

## 2019-12-06 DIAGNOSIS — I2510 Atherosclerotic heart disease of native coronary artery without angina pectoris: Secondary | ICD-10-CM

## 2019-12-06 DIAGNOSIS — I2602 Saddle embolus of pulmonary artery with acute cor pulmonale: Secondary | ICD-10-CM

## 2019-12-06 LAB — CBC
Hematocrit: 44.3 % (ref 37.5–51.0)
Hemoglobin: 15 g/dL (ref 13.0–17.7)
MCH: 28.9 pg (ref 26.6–33.0)
MCHC: 33.9 g/dL (ref 31.5–35.7)
MCV: 85 fL (ref 79–97)
Platelets: 222 10*3/uL (ref 150–450)
RBC: 5.19 x10E6/uL (ref 4.14–5.80)
RDW: 13.9 % (ref 11.6–15.4)
WBC: 7.4 10*3/uL (ref 3.4–10.8)

## 2019-12-06 NOTE — Telephone Encounter (Signed)
After review I would stop Plavix   He is on Xarelto    Stay on Xarelto

## 2019-12-06 NOTE — Patient Instructions (Signed)
Medication Instructions:  No changes *If you need a refill on your cardiac medications before your next appointment, please call your pharmacy*   Lab Work: CBC If you have labs (blood work) drawn today and your tests are completely normal, you will receive your results only by: Marland Kitchen MyChart Message (if you have MyChart) OR . A paper copy in the mail If you have any lab test that is abnormal or we need to change your treatment, we will call you to review the results.   Testing/Procedures: None ordered.   Follow-Up: At [facility], you and your health needs are our priority.  As part of our continuing mission to provide you with exceptional heart care, we have created designated Provider Care Teams.  These Care Teams include your primary Cardiologist (physician) and Advanced Practice Providers (APPs -  Physician Assistants and Nurse Practitioners) who all work together to provide you with the care you need, when you need it.  Your next appointment:   8 month(s) (October)  The format for your next appointment:   In Person  Provider:   You may see Dr. Dorris Carnes or one of the following Advanced Practice Providers on your designated Care Team:    Richardson Dopp, PA-C  [place], [place]  Daune Perch, NP    Other Instructions

## 2019-12-06 NOTE — Progress Notes (Signed)
Cardiology Office Note   Date:  12/06/2019   ID:  Braxten Forbush, DOB 1946-01-28, MRN [id number]  PCP:  Blanchie Serve, MD  Cardiologist:   Dorris Carnes, MD   Pt referred for continued care of CAD   History of Present Illness: Crystal Gendreau is a 74 y.o. male with a history of CAD  Cath in 2010:  Mild dz LAD; 70% OM lesion:  RCA: 60 to 70% prox RCA; 95% distal RCA.  Pt underwent BMS x 2 to RCA   Symptoms of angina:  discomfort in chest, then arms      Pt also history of HTN, HL, TIA  FHx CAD    I saw the patient in 2018   He aw seen by B Bhagat in Feb 2020  In Nov 2020 the pt was raking   Had some pains in chest    Stoppped   Went to bed   Still sore    That night had problems with breathing   Tried to sleep in recliner    Following am really hurting   Then Slept sitting   Went to ED at [facility]   CT done that showed a saddle PE    Pt considered for lytic Rx   Transferred to [place] instead on anticoagulant Rx  The pt denies long trip prior to event   No injury   He denies swelling in legs     Current Meds  Medication Sig  . acetaminophen (TYLENOL) 325 MG tablet Take 2 tablets (650 mg total) by mouth every 6 (six) hours as needed for mild pain (or Fever >/= 101).  Marland Kitchen ALPRAZolam (XANAX) 0.25 MG tablet Take 1 tablet (0.25 mg total) by mouth at bedtime as needed for anxiety.  . calcium carbonate (TUMS - DOSED IN MG ELEMENTAL CALCIUM) 500 MG chewable tablet Chew 2 tablets (400 mg of elemental calcium total) by mouth every 6 (six) hours as needed for indigestion or heartburn.  . clopidogrel (PLAVIX) 75 MG tablet Take 1 tablet (75 mg total) by mouth daily.  Marland Kitchen lisinopril-hydrochlorothiazide (ZESTORETIC) 10-12.5 MG tablet Take 1 tablet by mouth daily. Start 08/26/2019  . ondansetron (ZOFRAN) 4 MG tablet Take 1 tablet (4 mg total) by mouth every 6 (six) hours as needed for nausea.  . pantoprazole (PROTONIX) 40 MG tablet Take 1 tablet (40 mg total) by mouth daily.  . rosuvastatin (CRESTOR)  20 MG tablet Take 1 tablet (20 mg total) by mouth daily.  Marland Kitchen senna-docusate (SENOKOT-S) 8.6-50 MG tablet Take 2 tablets by mouth at bedtime.  Alveda Reasons 20 MG TABS tablet TAKE 1 TABLET BY MOUTH DAILY WITH SUPPER. START 09/22/19 AFTER COMPLETING STARTER PACK     Allergies:   Patient has no known allergies.   Past Medical History:  Diagnosis Date  . Arthritis   . CAD S/P percutaneous coronary angioplasty 2011   stents x 3  . Generalized anxiety disorder   . Hard of hearing   . Hyperlipidemia   . Hypertension   . Lower GI bleed 2014   colonscopy was normal  . 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 CORONARY ANGIOPLASTY WITH STENT PLACEMENT    . FRACTURE SURGERY  1995   right elbow  . KNEE ARTHROSCOPY W/ MENISCAL REPAIR       Social History:  The patient  reports that he quit smoking about 50 years ago. His smoking use included  cigarettes. He started smoking about 53 years ago. He smoked 0.30 packs per day. He has never used smokeless tobacco. He reports current alcohol use of about 3.0 standard drinks of alcohol per week. He reports that he does not use drugs.   Family History:  The patient's family history includes Alcohol abuse in his paternal uncle; Asthma in his daughter; Cancer in his paternal grandfather; Clotting disorder in his father; Heart disease (age of onset: 64) in his mother; Hyperlipidemia in his daughter; Lung disease in his father.    ROS:  Please see the history of present illness. All other systems are reviewed and  Negative to the above problem except as noted.    PHYSICAL EXAM: VS:  BP 138/82   Pulse (!) 59   Ht 6\' 2"  (1.88 m)   Wt 289 lb (131.1 kg)   SpO2 97%   BMI 37.11 kg/m   GEN: Obese 74 yo , in no acute distress  HEENT: normal  Neck: no JVD, carotid bruits Cardiac: RRR; no murmurs, rubs, or gallops,no edema  Respiratory:  clear to auscultation bilaterally, normal work of breathing GI:  soft, nontender, nondistended, + BS  No hepatomegaly  MS: no deformity Moving all extremities   Skin: warm and dry, no rash Neuro:  Strength and sensation are intact Psych: euthymic mood, full affect   EKG:  EKG is ordered today.  SB 59 +bpm       Lipid Panel    Component Value Date/Time   CHOL 143 11/26/2019 1137   TRIG 85 11/26/2019 1137   HDL 54 11/26/2019 1137   CHOLHDL 2.6 11/26/2019 1137   CHOLHDL 3.1 11/21/2017 0814   VLDL 13 11/07/2016 0826   LDLCALC 73 11/26/2019 1137   LDLCALC 82 11/21/2017 0814      Wt Readings from Last 3 Encounters:  12/06/19 289 lb (131.1 kg)  11/26/19 289 lb (131.1 kg)  09/02/19 283 lb (128.4 kg)      ASSESSMENT AND PLAN:  1  PE   Pt with massive PE this winter   No predisposing  cause   He does say his father had a PE   Given this I would  recomm continued anticoagulation   I would not stop Xarelto   Check CBC    2  CAD  No symptoms of angina   Since he will be on long term anticoagulation I would stop Plavix    2  HL  LDL is very good on 11/26/19   73   HDL 54     3  HTN  BP is fairly well controlled   4  Obesity  Needs to lose wt     F/u in 8 months   Signed, Dorris Carnes, MD  12/06/2019 11:24 AM    [place], [place],   [address] Phone: [phone]; Fax: [phone]

## 2019-12-08 ENCOUNTER — Ambulatory Visit: Payer: Medicare Other | Attending: Internal Medicine

## 2019-12-09 DIAGNOSIS — Z23 Encounter for immunization: Secondary | ICD-10-CM | POA: Diagnosis not present

## 2019-12-10 NOTE — Telephone Encounter (Signed)
Left messages on home and mobile numbers to call back. ?

## 2019-12-10 NOTE — Addendum Note (Signed)
Addended by: Rodman Key on: 12/10/2019 09:17 AM   Modules accepted: Orders

## 2019-12-10 NOTE — Telephone Encounter (Signed)
Patient returned call. He has been informed to stop Plavix and continue Xarelto.  CBC was normal.  No other changes.  Pt verbalizes understanding and agreement.

## 2019-12-18 ENCOUNTER — Telehealth: Payer: Self-pay | Admitting: *Deleted

## 2019-12-18 NOTE — Telephone Encounter (Signed)
SCHEDULE AWV 

## 2019-12-24 ENCOUNTER — Ambulatory Visit (INDEPENDENT_AMBULATORY_CARE_PROVIDER_SITE_OTHER): Payer: Medicare Other | Admitting: Family Medicine

## 2019-12-24 VITALS — BP 138/82 | Ht 74.0 in | Wt 289.0 lb

## 2019-12-24 DIAGNOSIS — Z0000 Encounter for general adult medical examination without abnormal findings: Secondary | ICD-10-CM | POA: Diagnosis not present

## 2019-12-24 NOTE — Progress Notes (Signed)
Presents today for [employer] Visit   Date of last exam: 11/26/19  Interpreter used for this visit? no I connected with  [name] on 12/24/19 by telephone enabled telemedicine application and verified that I am speaking with the correct person using two identifiers.   I discussed the limitations of evaluation and management by telemedicine. The patient expressed understanding and agreed to proceed.  Patient Care Team: Rutherford Guys, MD as PCP - General (Family Medicine) Ahmed Prima, Fransisco Hertz, PA-C as Physician Assistant (Physician Assistant) Register, Luetta Nutting, PA-C as Physician Assistant (Dermatology)   Other items to address today: Up to date on immunizations Received 1st Covid vaccine 12/09/19 F/u scheduled with pcp 02/25/20 @ 11:00 Dr Harrington Challenger d/c'd Plavix  Other Screening: Last screening for diabetes: 08/22/19 Last lipid screening: 11/27/19  ADVANCE DIRECTIVES: Discussed: yes  On file: no Materials Provided: yes Immunization status:  Immunization History  Administered Date(s) Administered  . Fluad Quad(high Dose 65+) 06/11/2019  . Influenza Whole 06/29/2016  . Influenza, High Dose Seasonal PF 08/16/2018  . Influenza,inj,Quad PF,6+ Mos 06/05/2017  . Influenza-Unspecified 07/20/2019  . Pneumococcal Conjugate-13 12/01/2016  . Pneumococcal Polysaccharide-23 04/10/2018     There are no preventive care reminders to display for this patient.   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 0 points  What month? 0 points  What time? 0 points  Count back from 20 0 points  Months in reverse 0 points  Repeat phrase 2 points  Total Score 2        Clinical Support from 12/24/2019 in [facility] at [facility]  AUDIT-C Score  0       Home Environment:  Retired from [employer] with wife [name] Two children Two big dogs Yes grab bars  Yes throw rugs  Adequate lighting  No clutter Does have trouble climbing stairs due to arthritis in both knees and hip  Patient Active Problem List   Diagnosis Date Noted  . Acute respiratory failure with hypoxia ([place]) 08/20/2019  . Lobar pneumonia ([place]) 08/20/2019  . Acute pulmonary embolus ([place]) 08/20/2019  . Pulmonary embolism ([place]) 08/20/2019  . Community acquired pneumonia of left lower lobe of lung   . 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 CAD in native artery 08/31/2016  . Essential hypertension 08/31/2016  . HLD (hyperlipidemia) 08/31/2016  . Family history of heart disease in male family member before age 75 08/31/2016  . 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 Generalized anxiety disorder 08/31/2016  . History of colonic polyps 08/31/2016     Past Medical History:  Diagnosis Date  . Arthritis   . CAD S/P percutaneous coronary angioplasty 2011   stents x 3  . Generalized anxiety disorder   . Hard of hearing   . Hyperlipidemia   . Hypertension   . Lower GI bleed 2014   colonscopy was normal  . 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  CORONARY ANGIOPLASTY WITH STENT PLACEMENT    . FRACTURE SURGERY  1995   right elbow  . KNEE ARTHROSCOPY W/ MENISCAL REPAIR       Family History  Problem Relation Age of Onset  . Heart disease Mother 41       heart attack  . Lung disease Father        asbestosis  . Clotting disorder Father   . Asthma Daughter   .  Hyperlipidemia Daughter   . Alcohol abuse Paternal Uncle   . Cancer Paternal Grandfather        lung     Social History   Socioeconomic History  . Marital status: Married    Spouse name: rose  . Number of children: 2  . Years of education: 92  . Highest education level: Not on file  Occupational History  . Occupation: retired    Comment: packing/shipping  Tobacco Use  . Smoking status: Former Smoker    Packs/day: 0.30    Types: Cigarettes    Start date: 08/31/1966    Quit date: 08/31/1969    Years since quitting: 50.3  . Smokeless tobacco: Never Used  Substance and Sexual Activity  . Alcohol use: Yes    Alcohol/week: 3.0 standard drinks    Types: 2 Cans of beer, 1 Shots of liquor per week  . Drug use: No  . Sexual activity: Yes  Other Topics Concern  . Not on file  Social History Narrative  . Not on file   Social Determinants of Health   Financial Resource Strain:   . Difficulty of Paying Living Expenses:   Food Insecurity:   . Worried About Charity fundraiser in the Last Year:   . Arboriculturist in the Last Year:   Transportation Needs:   . Film/video editor (Medical):   Marland Kitchen Lack of Transportation (Non-Medical):   Physical Activity:   . Days of Exercise per Week:   . Minutes of Exercise per Session:   Stress:   . Feeling of Stress :   Social Connections:   . Frequency of Communication with Friends and Family:   . Frequency of Social Gatherings with Friends and Family:   . Attends Religious Services:   . Active Member of Clubs or Organizations:   . Attends Archivist Meetings:   Marland Kitchen Marital Status:   Intimate Partner Violence:   . Fear of Current or Ex-Partner:   . Emotionally Abused:   Marland Kitchen Physically Abused:   . Sexually Abused:      No Known Allergies   Prior to Admission medications   Medication Sig Start Date End Date Taking? Authorizing Provider  acetaminophen (TYLENOL) 325 MG tablet Take 2 tablets (650 mg total) by mouth every 6  (six) hours as needed for mild pain (or Fever >/= 101). 08/24/19  Yes Emokpae, Courage, MD  ALPRAZolam (XANAX) 0.25 MG tablet Take 1 tablet (0.25 mg total) by mouth at bedtime as needed for anxiety. 11/26/19  Yes Rutherford Guys, MD  lisinopril-hydrochlorothiazide (ZESTORETIC) 10-12.5 MG tablet Take 1 tablet by mouth daily. Start 08/26/2019 11/26/19 11/25/20 Yes Rutherford Guys, MD  pantoprazole (PROTONIX) 40 MG tablet Take 1 tablet (40 mg total) by mouth daily. 11/26/19 11/25/20 Yes Rutherford Guys, MD  rosuvastatin (CRESTOR) 20 MG tablet Take 1 tablet (20 mg total) by mouth daily. 08/24/19  Yes Emokpae, Courage, MD  senna-docusate (SENOKOT-S) 8.6-50 MG tablet Take 2 tablets by mouth at bedtime. 08/24/19 08/23/20 Yes  Emokpae, Courage, MD  XARELTO 20 MG TABS tablet TAKE 1 TABLET BY MOUTH DAILY WITH SUPPER. START 09/22/19 AFTER COMPLETING STARTER PACK 11/21/19  Yes Rutherford Guys, MD  calcium carbonate (TUMS - DOSED IN MG ELEMENTAL CALCIUM) 500 MG chewable tablet Chew 2 tablets (400 mg of elemental calcium total) by mouth every 6 (six) hours as needed for indigestion or heartburn. Patient not taking: Reported on 12/24/2019 08/24/19   Roxan Hockey, MD  ondansetron (ZOFRAN) 4 MG tablet Take 1 tablet (4 mg total) by mouth every 6 (six) hours as needed for nausea. Patient not taking: Reported on 12/24/2019 08/24/19   Roxan Hockey, MD     Depression screen [facility] 2/9 12/24/2019 11/26/2019 09/02/2019 04/18/2019 10/12/2018  Decreased Interest 0 0 0 0 0  Down, Depressed, Hopeless 0 0 0 0 0  PHQ - 2 Score 0 0 0 0 0     Fall Risk  12/24/2019 11/26/2019 09/02/2019 04/18/2019 10/12/2018  Falls in the past year? 0 0 0 0 0  Number falls in past yr: 0 0 0 - -  Injury with Fall? 0 0 0 - -  Follow up Falls evaluation completed;Education provided - - Falls evaluation completed -      PHYSICAL EXAM: BP 138/82 Comment: not in clinic  Ht 6\' 2"  (1.88 m)   Wt 289 lb (131.1 kg)   BMI 37.11 kg/m    Wt Readings  from Last 3 Encounters:  12/24/19 289 lb (131.1 kg)  12/06/19 289 lb (131.1 kg)  11/26/19 289 lb (131.1 kg)     Medicare annual wellness visit, subsequent      Education/Counseling provided regarding diet and exercise, prevention of chronic diseases, smoking/tobacco cessation, if applicable, and reviewed "Covered Medicare Preventive Services."   ASSESSMENT/PLAN: There are no diagnoses linked to this encounter.

## 2019-12-24 NOTE — Patient Instructions (Signed)
Thank you for taking time to come for your Medicare Wellness Visit. I appreciate your ongoing commitment to your health goals. Please review the following plan we discussed and let me know if I can assist you in the future.  Bobbie Virden LPN Preventive Care 72 Years and Older, Male Preventive care refers to lifestyle choices and visits with your health care provider that can promote health and wellness. This includes:  A yearly physical exam. This is also called an annual well check.  Regular dental and eye exams.  Immunizations.  Screening for certain conditions.  Healthy lifestyle choices, such as diet and exercise. What can I expect for my preventive care visit? Physical exam Your health care provider will check:  Height and weight. These may be used to calculate body mass index (BMI), which is a measurement that tells if you are at a healthy weight.  Heart rate and blood pressure.  Your skin for abnormal spots. Counseling Your health care provider may ask you questions about:  Alcohol, tobacco, and drug use.  Emotional well-being.  Home and relationship well-being.  Sexual activity.  Eating habits.  History of falls.  Memory and ability to understand (cognition).  Work and work environment. What immunizations do I need?  Influenza (flu) vaccine  This is recommended every year. Tetanus, diphtheria, and pertussis (Tdap) vaccine  You may need a Td booster every 10 years. Varicella (chickenpox) vaccine  You may need this vaccine if you have not already been vaccinated. Zoster (shingles) vaccine  You may need this after age 60. Pneumococcal conjugate (PCV13) vaccine  One dose is recommended after age 65. Pneumococcal polysaccharide (PPSV23) vaccine  One dose is recommended after age 65. Measles, mumps, and rubella (MMR) vaccine  You may need at least one dose of MMR if you were born in 1957 or later. You may also need a second dose. Meningococcal  conjugate (MenACWY) vaccine  You may need this if you have certain conditions. Hepatitis A vaccine  You may need this if you have certain conditions or if you travel or work in places where you may be exposed to hepatitis A. Hepatitis B vaccine  You may need this if you have certain conditions or if you travel or work in places where you may be exposed to hepatitis B. Haemophilus influenzae type b (Hib) vaccine  You may need this if you have certain conditions. You may receive vaccines as individual doses or as more than one vaccine together in one shot (combination vaccines). Talk with your health care provider about the risks and benefits of combination vaccines. What tests do I need? Blood tests  Lipid and cholesterol levels. These may be checked every 5 years, or more frequently depending on your overall health.  Hepatitis C test.  Hepatitis B test. Screening  Lung cancer screening. You may have this screening every year starting at age 55 if you have a 30-pack-year history of smoking and currently smoke or have quit within the past 15 years.  Colorectal cancer screening. All adults should have this screening starting at age 50 and continuing until age 75. Your health care provider may recommend screening at age 45 if you are at increased risk. You will have tests every 1-10 years, depending on your results and the type of screening test.  Prostate cancer screening. Recommendations will vary depending on your family history and other risks.  Diabetes screening. This is done by checking your blood sugar (glucose) after you have not eaten for a   while (fasting). You may have this done every 1-3 years.  Abdominal aortic aneurysm (AAA) screening. You may need this if you are a current or former smoker.  Sexually transmitted disease (STD) testing. Follow these instructions at home: Eating and drinking  Eat a diet that includes fresh fruits and vegetables, whole grains, lean  protein, and low-fat dairy products. Limit your intake of foods with high amounts of sugar, saturated fats, and salt.  Take vitamin and mineral supplements as recommended by your health care provider.  Do not drink alcohol if your health care provider tells you not to drink.  If you drink alcohol: ? Limit how much you have to 0-2 drinks a day. ? Be aware of how much alcohol is in your drink. In the [place] one drink equals one 12 oz bottle of beer (355 mL), one 5 oz glass of wine (148 mL), or one 1 oz glass of hard liquor (44 mL). Lifestyle  Take daily care of your teeth and gums.  Stay active. Exercise for at least 30 minutes on 5 or more days each week.  Do not use any products that contain nicotine or tobacco, such as cigarettes, e-cigarettes, and chewing tobacco. If you need help quitting, ask your health care provider.  If you are sexually active, practice safe sex. Use a condom or other form of protection to prevent STIs (sexually transmitted infections).  Talk with your health care provider about taking a low-dose aspirin or statin. What's next?  Visit your health care provider once a year for a well check visit.  Ask your health care provider how often you should have your eyes and teeth checked.  Stay up to date on all vaccines. This information is not intended to replace advice given to you by your health care provider. Make sure you discuss any questions you have with your health care provider. Document Revised: 09/20/2018 Document Reviewed: 09/20/2018 Elsevier Patient Education  2020 [employer].

## 2020-01-06 DIAGNOSIS — Z23 Encounter for immunization: Secondary | ICD-10-CM | POA: Diagnosis not present

## 2020-01-30 ENCOUNTER — Ambulatory Visit
Admission: RE | Admit: 2020-01-30 | Discharge: 2020-01-30 | Disposition: A | Discharge status: Home / Self Care | Payer: Medicare Other | Source: Ambulatory Visit | Attending: Family Medicine | Admitting: Family Medicine

## 2020-01-30 ENCOUNTER — Other Ambulatory Visit: Payer: Medicare Other

## 2020-01-30 DIAGNOSIS — R918 Other nonspecific abnormal finding of lung field: Secondary | ICD-10-CM | POA: Diagnosis not present

## 2020-02-25 ENCOUNTER — Other Ambulatory Visit: Payer: Self-pay

## 2020-02-25 ENCOUNTER — Ambulatory Visit (INDEPENDENT_AMBULATORY_CARE_PROVIDER_SITE_OTHER): Payer: Medicare Other | Admitting: Family Medicine

## 2020-02-25 ENCOUNTER — Encounter: Payer: Self-pay | Admitting: Family Medicine

## 2020-02-25 VITALS — BP 117/80 | HR 65 | Temp 97.9°F | Ht 74.0 in | Wt 299.4 lb

## 2020-02-25 DIAGNOSIS — R918 Other nonspecific abnormal finding of lung field: Secondary | ICD-10-CM

## 2020-02-25 DIAGNOSIS — I10 Essential (primary) hypertension: Secondary | ICD-10-CM | POA: Diagnosis not present

## 2020-02-25 DIAGNOSIS — Z86711 Personal history of pulmonary embolism: Secondary | ICD-10-CM | POA: Diagnosis not present

## 2020-02-25 DIAGNOSIS — Z7901 Long term (current) use of anticoagulants: Secondary | ICD-10-CM | POA: Diagnosis not present

## 2020-02-25 DIAGNOSIS — E785 Hyperlipidemia, unspecified: Secondary | ICD-10-CM | POA: Diagnosis not present

## 2020-02-25 MED ORDER — LISINOPRIL-HYDROCHLOROTHIAZIDE 10-12.5 MG PO TABS: 1.0000 | ORAL_TABLET | Freq: Every day | ORAL | 1 refills | Status: DC

## 2020-02-25 MED ORDER — ROSUVASTATIN CALCIUM 20 MG PO TABS: 20.0000 mg | ORAL_TABLET | Freq: Every day | ORAL | 3 refills | Status: DC

## 2020-02-25 MED ORDER — RIVAROXABAN 20 MG PO TABS: 20.0000 mg | ORAL_TABLET | Freq: Every day | ORAL | 1 refills | Status: DC

## 2020-02-25 MED ORDER — PANTOPRAZOLE SODIUM 40 MG PO TBEC: 40.0000 mg | DELAYED_RELEASE_TABLET | Freq: Every day | ORAL | 1 refills | Status: DC

## 2020-02-25 NOTE — Patient Instructions (Signed)
° ° ° °  If you have lab work done today you will be contacted with your lab results within the next 2 weeks.  If you have not heard from us then please contact us. The fastest way to get your results is to register for My Chart. ° ° °IF you received an x-ray today, you will receive an invoice from [facility]. Please contact [facility] at [phone] with questions or concerns regarding your invoice.  ° °IF you received labwork today, you will receive an invoice from [facility]. Please contact [facility] at [phone] with questions or concerns regarding your invoice.  ° °Our billing staff will not be able to assist you with questions regarding bills from these companies. ° °You will be contacted with the lab results as soon as they are available. The fastest way to get your results is to activate your My Chart account. Instructions are located on the last page of this paperwork. If you have not heard from us regarding the results in 2 weeks, please contact this office. °  ° ° ° °

## 2020-02-25 NOTE — Progress Notes (Signed)
5/18/202111:20 AM  [name] [birth year], 74 y.o., male [id number]  Chief Complaint  Patient presents with  . Hypertension  . Hyperlipidemia  . ct scan    last ct scan done in pulmonary embolism    HPI:   Patient is a 74 y.o. male with past medical history significant for CADs/p stents, HTN and HLP, hard ofhearing, saddle PE nov 2020who presents today forroutinefollowup  Last OV feb 2021 - no changes Saw cards, Dr Harrington Challenger, Feb 2021 - recommends long term anticoagulation and stop plavix, fu 8 months Had ct chest April 2021 -  1. Coronary artery calcifications are noted suggesting coronary artery disease. 2. Hepatic steatosis. 3. Nonobstructive left nephrolithiasis. 4. Stable bilateral pulmonary nodules are noted, with the largest measuring 6 mm in the left upper lobe. Follow-up unenhanced chest CT in 12 months is recommended to ensure stability and rule out neoplasm.  Lab Results  Component Value Date   WBC 7.4 12/06/2019   HGB 15.0 12/06/2019   HCT 44.3 12/06/2019   MCV 85 12/06/2019   PLT 222 12/06/2019   Lab Results  Component Value Date   CHOL 143 11/26/2019   HDL 54 11/26/2019   LDLCALC 73 11/26/2019   TRIG 85 11/26/2019   CHOLHDL 2.6 11/26/2019   Lab Results  Component Value Date   CREATININE 0.97 11/26/2019   BUN 20 11/26/2019   NA 140 11/26/2019   K 4.2 11/26/2019   CL 104 11/26/2019   CO2 23 11/26/2019    Depression screen PHQ 2/9 12/24/2019 11/26/2019 09/02/2019  Decreased Interest 0 0 0  Down, Depressed, Hopeless 0 0 0  PHQ - 2 Score 0 0 0    Fall Risk  02/25/2020 12/24/2019 11/26/2019 09/02/2019 04/18/2019  Falls in the past year? 0 0 0 0 0  Number falls in past yr: 0 0 0 0 -  Injury with Fall? 0 0 0 0 -  Follow up - Falls evaluation completed;Education provided - - Falls evaluation completed     No Known Allergies  Prior to Admission medications   Medication Sig Start Date End Date Taking? Authorizing Provider  acetaminophen (TYLENOL)  325 MG tablet Take 2 tablets (650 mg total) by mouth every 6 (six) hours as needed for mild pain (or Fever >/= 101). 08/24/19  Yes Emokpae, Courage, MD  ALPRAZolam (XANAX) 0.25 MG tablet Take 1 tablet (0.25 mg total) by mouth at bedtime as needed for anxiety. 11/26/19  Yes Rutherford Guys, MD  calcium carbonate (TUMS - DOSED IN MG ELEMENTAL CALCIUM) 500 MG chewable tablet Chew 2 tablets (400 mg of elemental calcium total) by mouth every 6 (six) hours as needed for indigestion or heartburn. 08/24/19  Yes Emokpae, Courage, MD  lisinopril-hydrochlorothiazide (ZESTORETIC) 10-12.5 MG tablet Take 1 tablet by mouth daily. Start 08/26/2019 11/26/19 11/25/20 Yes Rutherford Guys, MD  ondansetron (ZOFRAN) 4 MG tablet Take 1 tablet (4 mg total) by mouth every 6 (six) hours as needed for nausea. 08/24/19  Yes Emokpae, Courage, MD  pantoprazole (PROTONIX) 40 MG tablet Take 1 tablet (40 mg total) by mouth daily. 11/26/19 11/25/20 Yes Rutherford Guys, MD  rosuvastatin (CRESTOR) 20 MG tablet Take 1 tablet (20 mg total) by mouth daily. 08/24/19  Yes Emokpae, Courage, MD  senna-docusate (SENOKOT-S) 8.6-50 MG tablet Take 2 tablets by mouth at bedtime. 08/24/19 08/23/20 Yes Emokpae, Courage, MD  XARELTO 20 MG TABS tablet TAKE 1 TABLET BY MOUTH DAILY WITH SUPPER. START 09/22/19 AFTER COMPLETING STARTER PACK 11/21/19  Yes Rutherford Guys, MD    Past Medical History:  Diagnosis Date  . Arthritis   . CAD S/P percutaneous coronary angioplasty 2011   stents x 3  . Generalized anxiety disorder   . Hard of hearing   . Hyperlipidemia   . Hypertension   . Lower GI bleed 2014   colonscopy was normal  . 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 CORONARY ANGIOPLASTY WITH STENT PLACEMENT    . FRACTURE SURGERY  1995   right elbow  . KNEE ARTHROSCOPY W/ MENISCAL REPAIR      Social History   Tobacco Use  . Smoking status: Former Smoker    Packs/day: 0.30     Types: Cigarettes    Start date: 08/31/1966    Quit date: 08/31/1969    Years since quitting: 50.5  . Smokeless tobacco: Never Used  Substance Use Topics  . Alcohol use: Yes    Alcohol/week: 3.0 standard drinks    Types: 2 Cans of beer, 1 Shots of liquor per week    Family History  Problem Relation Age of Onset  . Heart disease Mother 58       heart attack  . Lung disease Father        asbestosis  . Clotting disorder Father   . Asthma Daughter   . Hyperlipidemia Daughter   . Alcohol abuse Paternal Uncle   . Cancer Paternal Grandfather        lung    Review of Systems  Constitutional: Negative for chills and fever.  Respiratory: Negative for cough and shortness of breath.   Cardiovascular: Negative for chest pain, palpitations and leg swelling.  Gastrointestinal: Negative for abdominal pain, blood in stool, melena, nausea and vomiting.  Genitourinary: Negative for hematuria.  Endo/Heme/Allergies: Does not bruise/bleed easily.     OBJECTIVE:  Today's Vitals   02/25/20 1113  BP: 117/80  Pulse: 65  Temp: 97.9 F (36.6 C)  SpO2: 95%  Weight: 299 lb 6.4 oz (135.8 kg)  Height: 6\' 2"  (1.88 m)   Body mass index is 38.44 kg/m.   Wt Readings from Last 3 Encounters:  02/25/20 299 lb 6.4 oz (135.8 kg)  12/24/19 289 lb (131.1 kg)  12/06/19 289 lb (131.1 kg)     Physical Exam Vitals and nursing note reviewed.  Constitutional:      Appearance: He is well-developed.  HENT:     Head: Normocephalic and atraumatic.  Eyes:     Conjunctiva/sclera: Conjunctivae normal.     Pupils: Pupils are equal, round, and reactive to light.  Cardiovascular:     Rate and Rhythm: Normal rate and regular rhythm.     Heart sounds: No murmur. No friction rub. No gallop.   Pulmonary:     Effort: Pulmonary effort is normal.     Breath sounds: Normal breath sounds. No wheezing or rales.  Musculoskeletal:     Cervical back: Neck supple.  Skin:    General: Skin is warm and dry.    Neurological:     Mental Status: He is alert and oriented to person, place, and time.     No results found for this or any previous visit (from the past 24 hour(s)).  No results found.   ASSESSMENT and PLAN  1. Essential hypertension Controlled. Continue current regime.  - Basic Metabolic Panel  2. Hyperlipidemia LDL goal <70 At goal. Cont current regime  3. History of pulmonary  embolism 4. Current use of long term anticoagulation Recovered well. No bleeding issues. On long term anticoagulant.  - CBC  5. Pulmonary nodules/lesions, multiple Reorder CT scan at next OV for monitoring  Other orders - lisinopril-hydrochlorothiazide (ZESTORETIC) 10-12.5 MG tablet; Take 1 tablet by mouth daily. Start 08/26/2019 - pantoprazole (PROTONIX) 40 MG tablet; Take 1 tablet (40 mg total) by mouth daily. - rosuvastatin (CRESTOR) 20 MG tablet; Take 1 tablet (20 mg total) by mouth daily. - rivaroxaban (XARELTO) 20 MG TABS tablet; Take 1 tablet (20 mg total) by mouth daily with supper.  Return in about 6 months (around 08/27/2020).    Rutherford Guys, MD Primary [facility] at [facility] [place], [place] [address] Ph.  [phone] Fax [phone]

## 2020-02-26 LAB — CBC
Hematocrit: 46.1 % (ref 37.5–51.0)
Hemoglobin: 15.4 g/dL (ref 13.0–17.7)
MCH: 29 pg (ref 26.6–33.0)
MCHC: 33.4 g/dL (ref 31.5–35.7)
MCV: 87 fL (ref 79–97)
Platelets: 220 10*3/uL (ref 150–450)
RBC: 5.31 x10E6/uL (ref 4.14–5.80)
RDW: 13.8 % (ref 11.6–15.4)
WBC: 7 10*3/uL (ref 3.4–10.8)

## 2020-02-26 LAB — BASIC METABOLIC PANEL
BUN/Creatinine Ratio: 27 — ABNORMAL HIGH (ref 10–24)
BUN: 23 mg/dL (ref 8–27)
CO2: 22 mmol/L (ref 20–29)
Calcium: 9.7 mg/dL (ref 8.6–10.2)
Chloride: 104 mmol/L (ref 96–106)
Creatinine, Ser: 0.86 mg/dL (ref 0.76–1.27)
GFR calc Af Amer: 101 mL/min/{1.73_m2} (ref >59)
GFR calc non Af Amer: 87 mL/min/{1.73_m2} (ref >59)
Glucose: 89 mg/dL (ref 65–99)
Potassium: 4.4 mmol/L (ref 3.5–5.2)
Sodium: 140 mmol/L (ref 134–144)

## 2020-07-29 DIAGNOSIS — Z23 Encounter for immunization: Secondary | ICD-10-CM | POA: Diagnosis not present

## 2020-07-30 ENCOUNTER — Ambulatory Visit: Payer: Medicare Other | Admitting: Family Medicine

## 2020-08-02 IMAGING — CT CT CHEST W/O CM
2 of 4 series · 14 of 36 positions shown, 17 images · non-contrast
Comparison: August 20, 2019.

CLINICAL DATA: Pulmonary nodules.

EXAM:
CT CHEST WITHOUT CONTRAST
TECHNIQUE: Multidetector CT imaging of the chest was performed following the
standard protocol without IV contrast.

[Series 2: chest 2.00 br40 s3 · axial · 0.80mm/px · z∈[+1647,+1955]mm · 11 of 184 slices shown, 14 images (1 of 2)]
[im 15/184  mediastinal]
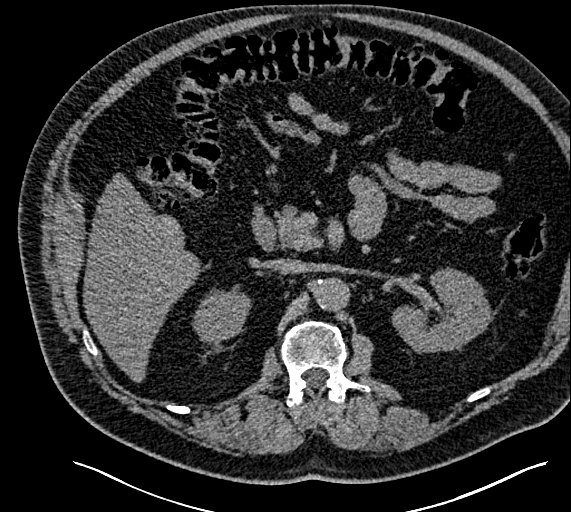
[im 15/184  lung]
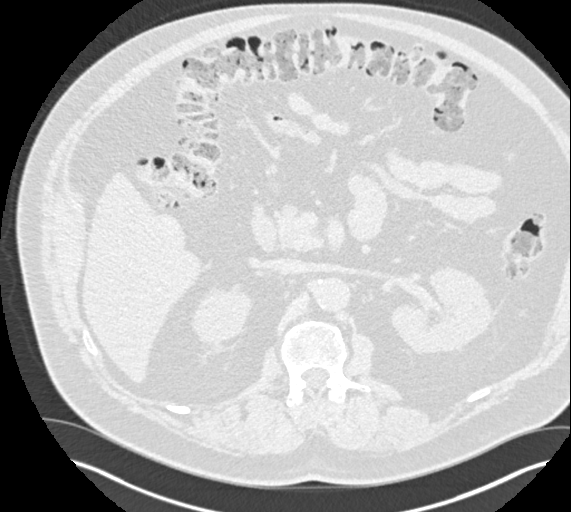
[im 29/184  lung]
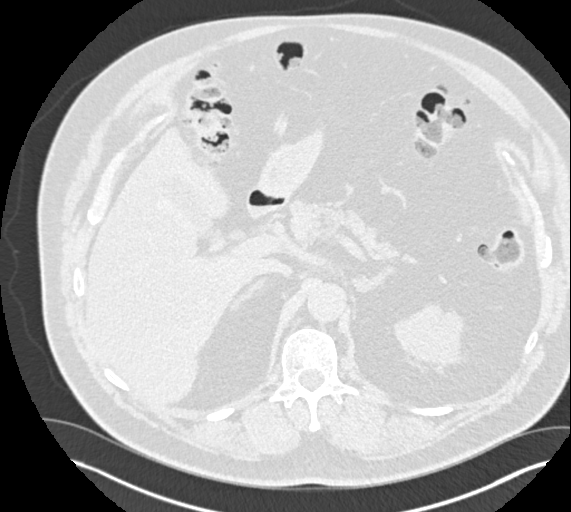
[im 43/184  lung]
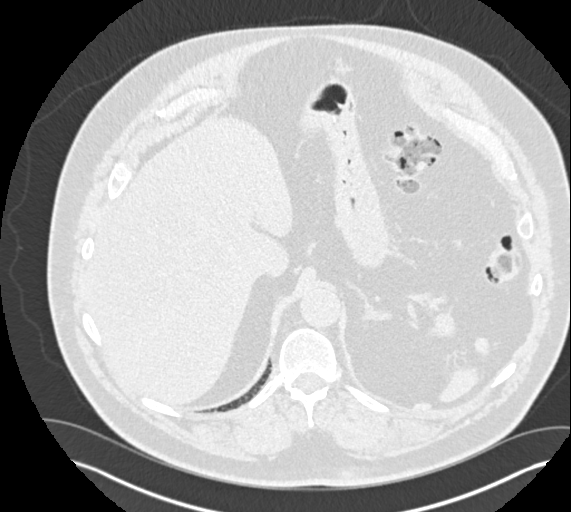
[im 57/184  lung]
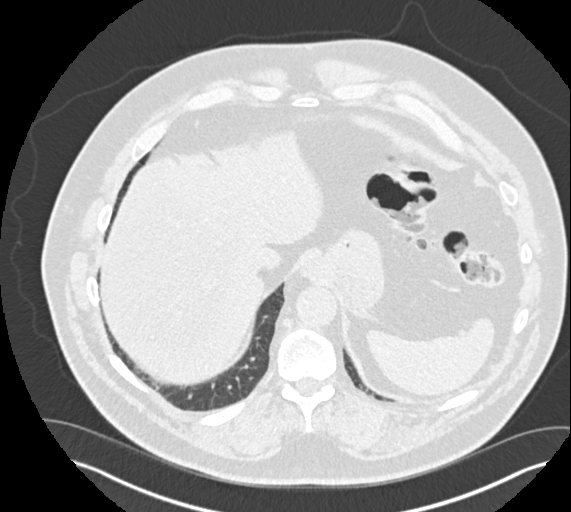
[im 71/184  mediastinal]
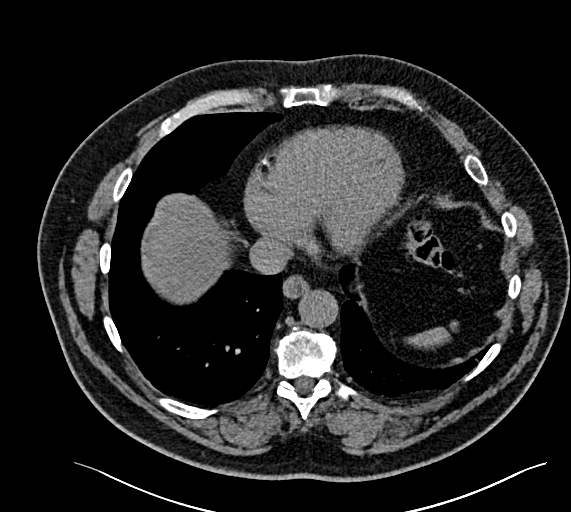
[im 71/184  lung]
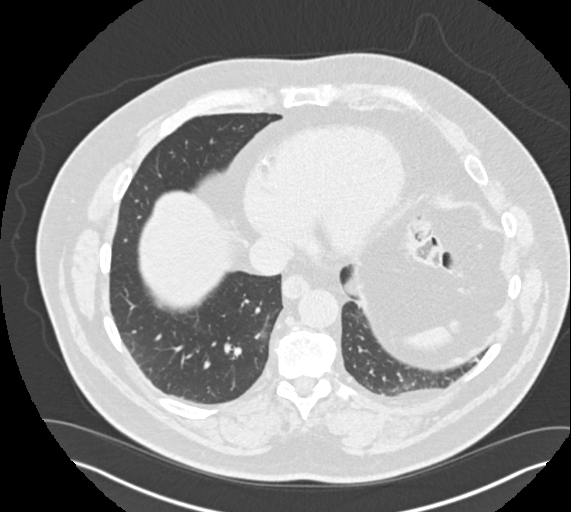
[im 99/184  lung]
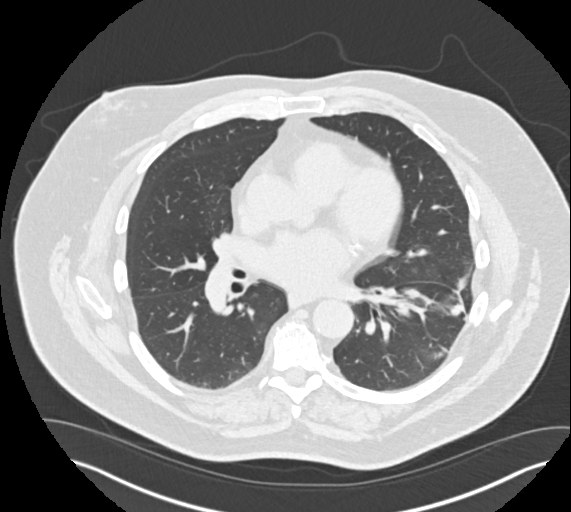
[im 113/184  lung]
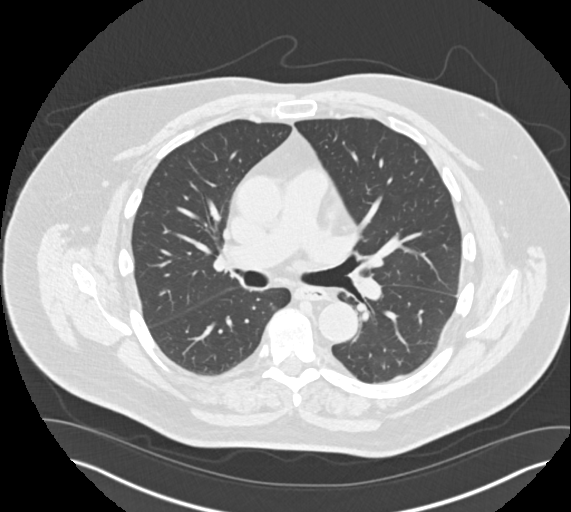
[im 127/184  lung]
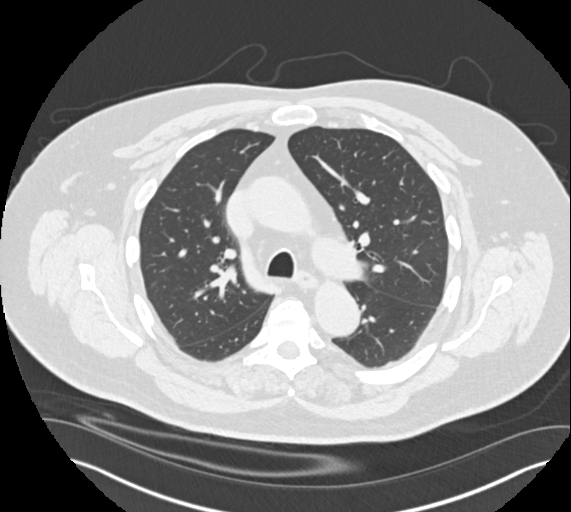
[im 141/184  mediastinal]
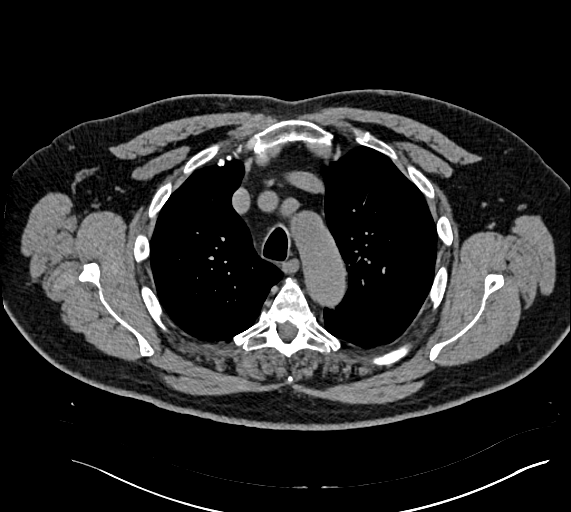
[im 141/184  lung]
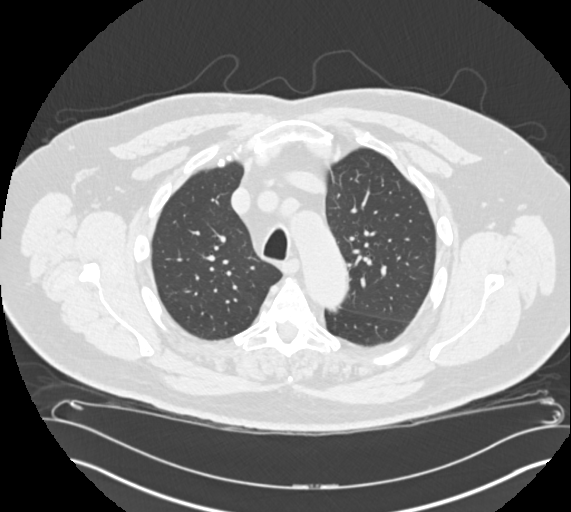
[im 155/184  lung]
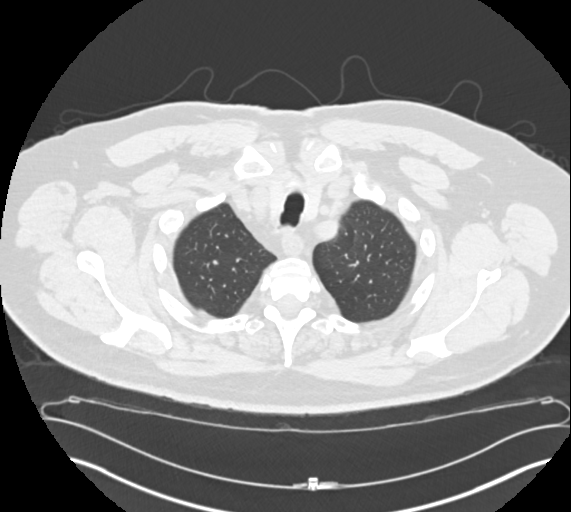
[im 169/184  lung]
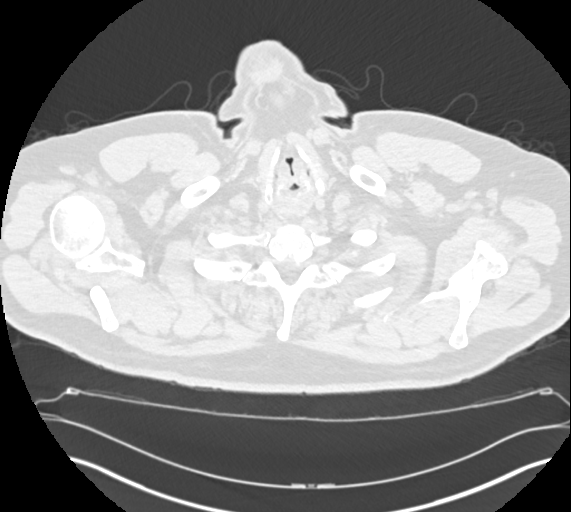

[Series 4: chest 2.00 br40 s3 · coronal · 0.72mm/px · 3 of 205 slices shown (2 of 2)]
[im 41/205  lung]
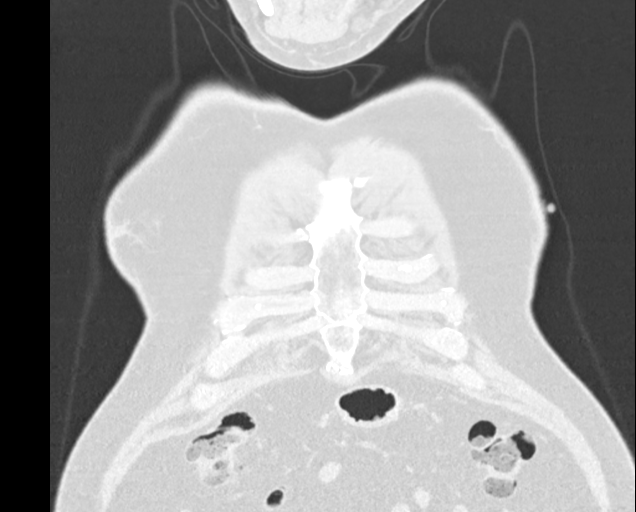
[im 82/205  lung]
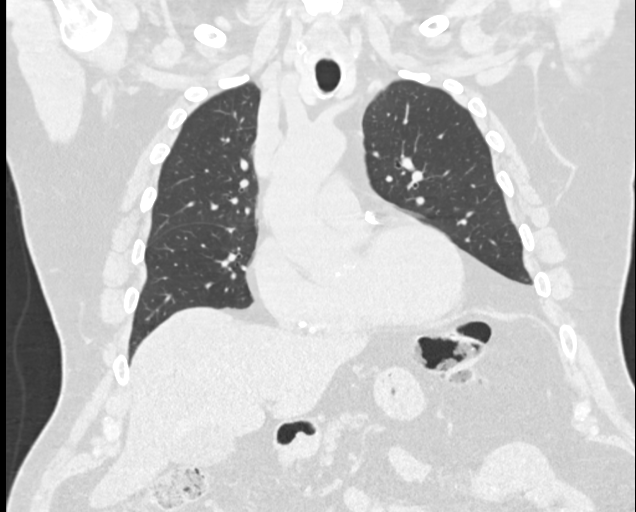
[im 123/205  lung]
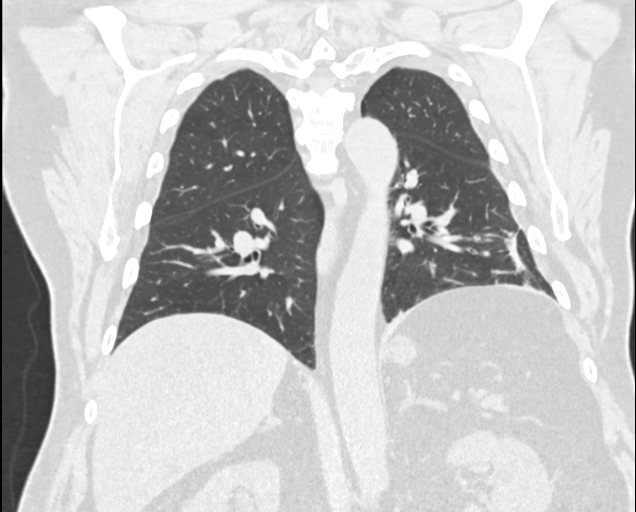

[14 of 36 positions shown; findings below may reference images not displayed]

FINDINGS: Cardiovascular: Atherosclerosis of thoracic aorta is noted without
aneurysm formation. Normal cardiac size. No pericardial effusions.
Coronary artery calcifications are noted.

Mediastinum/Nodes: No enlarged mediastinal or axillary lymph nodes.
Thyroid gland, trachea, and esophagus demonstrate no significant
findings.

Lungs/Pleura: No pneumothorax or pleural effusion is noted. Stable 5
mm nodule is noted in right middle lobe best seen on image number
102 of series 8. Stable 5 mm nodule is seen in superior segment of
left lower lobe is noted best seen on image number 69 of series 8. 6
mm nodule is noted laterally in the left upper lobe best seen on
image number 64 series 8; this was not visualized on prior exam
although it may have been obscured by overlying atelectasis on prior
exam. Mild scarring is noted in the left lung base.

Upper Abdomen: Hepatic steatosis is noted. Nonobstructive left
nephrolithiasis is noted.

Musculoskeletal: No chest wall mass or suspicious bone lesions
identified.
IMPRESSION: 1. Coronary artery calcifications are noted suggesting coronary
artery disease.
2. Hepatic steatosis.
3. Nonobstructive left nephrolithiasis.
4. Stable bilateral pulmonary nodules are noted, with the largest
measuring 6 mm in the left upper lobe. Follow-up unenhanced chest CT
in 12 months is recommended to ensure stability and rule out
neoplasm.

Aortic Atherosclerosis ([id number]-766.6).

## 2020-08-06 ENCOUNTER — Other Ambulatory Visit: Payer: Self-pay

## 2020-08-06 ENCOUNTER — Encounter: Payer: Self-pay | Admitting: Family Medicine

## 2020-08-06 ENCOUNTER — Ambulatory Visit (INDEPENDENT_AMBULATORY_CARE_PROVIDER_SITE_OTHER): Payer: Medicare Other | Admitting: Family Medicine

## 2020-08-06 VITALS — BP 138/79 | HR 60 | Temp 97.7°F | Ht 74.0 in | Wt 310.0 lb

## 2020-08-06 DIAGNOSIS — I2510 Atherosclerotic heart disease of native coronary artery without angina pectoris: Secondary | ICD-10-CM | POA: Diagnosis not present

## 2020-08-06 DIAGNOSIS — R609 Edema, unspecified: Secondary | ICD-10-CM

## 2020-08-06 DIAGNOSIS — I872 Venous insufficiency (chronic) (peripheral): Secondary | ICD-10-CM

## 2020-08-06 NOTE — Progress Notes (Signed)
Patient ID: [name], male    DOB: 02/17/1946  Age: 74 y.o. MRN: [id number]  Chief Complaint  Patient presents with   Edema    FYI have to pull doen mask so pt can read your lips.Major swelling in the left leg vs the right leg and feet. Also has some oozing of fluid from the left leg. No pain. Thinks he may have arthritis. Also has rash for about 5 yrs on the left leg. Swelling comes and goes    Subjective:   Patient has been having a lot of trouble with swelling in his legs.  He now loses fluid from the back of the left leg.  They itch a little bit and he picks at them some.  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  Nothing new in his life or diet that is increased swelling, just gradually gotten worse this year.  Current allergies, medications, problem list, past/family and social histories reviewed.  Objective:  BP 138/79    Pulse 60    Temp 97.7 F (36.5 C)    Ht 6\' 2"  (1.88 m)    Wt (!) 310 lb (140.6 kg)    SpO2 96%    BMI 39.80 kg/m   No acute distress.  3-4+ pitting edema of both legs up to near the knees.  Fluid weeping from the lower back and left leg.  Some mild bilateral stasis dermatitis where he is done some picking.  Pulses in the feet are indistinct.  Assessment & Plan:   Assessment: 1. Edema, unspecified type   2. Stasis dermatitis of both legs       Plan: See instructions.  We will check renal function and liver enzymes to make sure those look okay, but I hope Lasix will take more of his fluid off.  Elevating legs and compression stockings may help also.  Orders Placed This Encounter  Procedures   Comprehensive metabolic panel    No orders of the defined types were placed in this encounter.        Patient Instructions    Avoid excessive salt intake  Elevate legs as much of the time as possible  Walk regularly  Wear compression hose  Discontinue the lisinopril hydrochlorothiazide  Begin plain  lisinopril 10 mg daily  Take Lasix 40 mg (furosemide).  I recommend starting with 1/2 tablet daily for a few days before increasing to 1 tablet daily.  Take in the morning because it will make you urinate more.  Try to avoid picking at the skin of the legs.  Get rechecked in about a month, sooner if worse problems.   If you have lab work done today you will be contacted with your lab results within the next 2 weeks.  If you have not heard from [place] then please contact us. The fastest way to get your results is to register for My Chart.   IF you received an x-ray today, you will receive an invoice from [facility]. Please contact [facility] at [phone] with questions or concerns regarding your invoice.   IF you received labwork today, you will receive an invoice from [facility]. Please contact [facility] at [phone] with questions or concerns regarding your invoice.   Our billing staff will not be able to assist you with questions regarding bills from these companies.  You will be contacted with the lab results as soon as they are available. The fastest way to get  your results is to activate your My Chart account. Instructions are located on the last page of this paperwork. If you have not heard from [place] regarding the results in 2 weeks, please contact this office.        Return in about 4 weeks (around 09/03/2020) for Leg edema.   Ruben Reason, MD 08/06/2020

## 2020-08-06 NOTE — Patient Instructions (Addendum)
  Avoid excessive salt intake  Elevate legs as much of the time as possible  Walk regularly  Wear compression hose  Discontinue the lisinopril hydrochlorothiazide  Begin plain lisinopril 10 mg daily  Take Lasix 40 mg (furosemide).  I recommend starting with 1/2 tablet daily for a few days before increasing to 1 tablet daily.  Take in the morning because it will make you urinate more.  Try to avoid picking at the skin of the legs.  Get rechecked in about a month, sooner if worse problems.   If you have lab work done today you will be contacted with your lab results within the next 2 weeks.  If you have not heard from [place] then please contact us. The fastest way to get your results is to register for My Chart.   IF you received an x-ray today, you will receive an invoice from [facility]. Please contact [facility] at [phone] with questions or concerns regarding your invoice.   IF you received labwork today, you will receive an invoice from [facility]. Please contact [facility] at [phone] with questions or concerns regarding your invoice.   Our billing staff will not be able to assist you with questions regarding bills from these companies.  You will be contacted with the lab results as soon as they are available. The fastest way to get your results is to activate your My Chart account. Instructions are located on the last page of this paperwork. If you have not heard from [place] regarding the results in 2 weeks, please contact this office.

## 2020-08-07 ENCOUNTER — Telehealth: Payer: Self-pay | Admitting: Family Medicine

## 2020-08-07 LAB — COMPREHENSIVE METABOLIC PANEL
ALT: 26 IU/L (ref 0–44)
AST: 18 IU/L (ref 0–40)
Albumin/Globulin Ratio: 1.8 (ref 1.2–2.2)
Albumin: 4.4 g/dL (ref 3.7–4.7)
Alkaline Phosphatase: 64 IU/L (ref 44–121)
BUN/Creatinine Ratio: 21 (ref 10–24)
BUN: 20 mg/dL (ref 8–27)
Bilirubin Total: 0.4 mg/dL (ref 0.0–1.2)
CO2: 25 mmol/L (ref 20–29)
Calcium: 9.6 mg/dL (ref 8.6–10.2)
Chloride: 104 mmol/L (ref 96–106)
Creatinine, Ser: 0.96 mg/dL (ref 0.76–1.27)
GFR calc Af Amer: 91 mL/min/{1.73_m2} (ref >59)
GFR calc non Af Amer: 79 mL/min/{1.73_m2} (ref >59)
Globulin, Total: 2.5 g/dL (ref 1.5–4.5)
Glucose: 95 mg/dL (ref 65–99)
Potassium: 4.2 mmol/L (ref 3.5–5.2)
Sodium: 142 mmol/L (ref 134–144)
Total Protein: 6.9 g/dL (ref 6.0–8.5)

## 2020-08-07 NOTE — Telephone Encounter (Signed)
Pt is calling about the Lisinopril and laxis Rx that was suppose to be sent to the pharmacy / please advise when sent

## 2020-08-08 DIAGNOSIS — Z23 Encounter for immunization: Secondary | ICD-10-CM | POA: Diagnosis not present

## 2020-08-10 ENCOUNTER — Telehealth: Payer: Self-pay | Admitting: Family Medicine

## 2020-08-10 MED ORDER — FUROSEMIDE 40 MG PO TABS: 40.0000 mg | ORAL_TABLET | Freq: Every day | ORAL | 3 refills | Status: DC

## 2020-08-10 MED ORDER — LISINOPRIL-HYDROCHLOROTHIAZIDE 10-12.5 MG PO TABS: 1.0000 | ORAL_TABLET | Freq: Every day | ORAL | 0 refills | Status: DC

## 2020-08-10 MED ORDER — LISINOPRIL 10 MG PO TABS: 10.0000 mg | ORAL_TABLET | Freq: Every day | ORAL | 1 refills | Status: DC

## 2020-08-10 NOTE — Telephone Encounter (Signed)
Pt's wife called and stated that pt was on the lisinopril only and not the combo pill with HCTZ.  Corrected Rx was sent to pharmacy.

## 2020-08-10 NOTE — Addendum Note (Signed)
Addended by: Kittie Plater, Ji Fairburn HUA on: 08/10/2020 01:38 PM   Modules accepted: Orders

## 2020-08-10 NOTE — Telephone Encounter (Signed)
I have attempted to call pt and there was no answer. The medications have been sent to pharmacy.

## 2020-08-25 ENCOUNTER — Ambulatory Visit: Payer: Medicare Other | Admitting: Family Medicine

## 2020-09-10 ENCOUNTER — Other Ambulatory Visit: Payer: Self-pay

## 2020-09-10 ENCOUNTER — Ambulatory Visit (INDEPENDENT_AMBULATORY_CARE_PROVIDER_SITE_OTHER): Payer: Medicare Other | Admitting: Family Medicine

## 2020-09-10 ENCOUNTER — Encounter: Payer: Self-pay | Admitting: Family Medicine

## 2020-09-10 VITALS — BP 127/76 | HR 54 | Temp 98.1°F | Resp 17 | Ht 74.0 in | Wt 308.4 lb

## 2020-09-10 DIAGNOSIS — L853 Xerosis cutis: Secondary | ICD-10-CM

## 2020-09-10 DIAGNOSIS — Z86711 Personal history of pulmonary embolism: Secondary | ICD-10-CM

## 2020-09-10 DIAGNOSIS — R609 Edema, unspecified: Secondary | ICD-10-CM | POA: Diagnosis not present

## 2020-09-10 DIAGNOSIS — I872 Venous insufficiency (chronic) (peripheral): Secondary | ICD-10-CM

## 2020-09-10 DIAGNOSIS — I2510 Atherosclerotic heart disease of native coronary artery without angina pectoris: Secondary | ICD-10-CM

## 2020-09-10 MED ORDER — RIVAROXABAN 20 MG PO TABS: 20.0000 mg | ORAL_TABLET | Freq: Every day | ORAL | 1 refills | Status: DC

## 2020-09-10 MED ORDER — FUROSEMIDE 40 MG PO TABS: 40.0000 mg | ORAL_TABLET | Freq: Every day | ORAL | 1 refills | Status: DC

## 2020-09-10 NOTE — Patient Instructions (Addendum)
  Continue taking the Lasix daily (furosemide)  Continue on your Xarelto  Eat plenty of vegetables and fruits as we discussed to keep your potassium good.  Return in 2 months for recheck  Elevate legs when possible  Drastically avoid salt as discussed  Use mineral oil or Vaseline on your legs to try and keep the skin from being so dry and itchy.  If you have any redness or sores you can use an antibiotic ointment, and I would prefer Polysporin to Neosporin.  Return sooner if needed   If you have lab work done today you will be contacted with your lab results within the next 2 weeks.  If you have not heard from [place] then please contact us. The fastest way to get your results is to register for My Chart.   IF you received an x-ray today, you will receive an invoice from [facility]. Please contact [facility] at [phone] with questions or concerns regarding your invoice.   IF you received labwork today, you will receive an invoice from [place]. Please contact [facility] at [phone] with questions or concerns regarding your invoice.   Our billing staff will not be able to assist you with questions regarding bills from these companies.  You will be contacted with the lab results as soon as they are available. The fastest way to get your results is to activate your My Chart account. Instructions are located on the last page of this paperwork. If you have not heard from [place] regarding the results in 2 weeks, please contact this office.

## 2020-09-10 NOTE — Progress Notes (Signed)
Patient ID: [name], male    DOB: 16-Sep-1946  Age: 74 y.o. MRN: [id number]  Chief Complaint  Patient presents with  . Edema    pt reports doing well leg swelling reduced, pt does report more so foot swelling then legs for the last month, pt also reports reduced salt intake as dirrected he has been experimenting with what he can and cannot eat. pt does report that when he takes his lasix the first 4-5 hours he cannot be away from a restroom as he must urinate frequently   . Medication Refill    pt is requesting refill [facility] otherwise he is doing well no side effects with medication    Subjective:   Patient is here for a recheck with regard to his leg edema.  He has experimented with eating a little bit, and found that salty foods such as [place] sausage because his swelling to be much worse.  He has been trying to behave.  They eat a lot of frozen vegetables.  We talked about sources of potassium.  He feels okay.  The Lasix limits his ability to go out.  It takes several hours for him to get the fluid out of his system.  He has itching dry skin on his legs that he has been rubbing Neosporin on regularly.  He scratches them a lot.  He takes Xarelto because of his pulmonary embolus unknown etiology a year ago.  They told him he should stay on that long-term.  Current allergies, medications, problem list, past/family and social histories reviewed.  Objective:  BP 127/76   Pulse (!) 54   Temp 98.1 F (36.7 C) (Temporal)   Resp 17   Ht 6\' 2"  (1.88 m)   Wt (!) 308 lb 6.4 oz (139.9 kg)   SpO2 97%   BMI 39.60 kg/m  Pleasant and alert, hard of hearing.  3+ edema both legs still but no weeping today.  Mild excoriated dry skin but no broken skin or ulcerations.  He is not weeping from his legs any longer. Assessment & Plan:   Assessment: 1. Edema, unspecified type   2. Stasis dermatitis of both legs   3. History of pulmonary embolism   4. Dry skin  dermatitis       Plan: See instructions  No orders of the defined types were placed in this encounter.   Meds ordered this encounter  Medications  . rivaroxaban (XARELTO) 20 MG TABS tablet    Sig: Take 1 tablet (20 mg total) by mouth daily with supper.    Dispense:  90 tablet    Refill:  1  . furosemide (LASIX) 40 MG tablet    Sig: Take 1 tablet (40 mg total) by mouth daily. Take 1/2 tab for 5 days before taking whole tab.    Dispense:  90 tablet    Refill:  1         Patient Instructions    Continue taking the Lasix daily (furosemide)  Continue on your Xarelto  Eat plenty of vegetables and fruits as we discussed to keep your potassium good.  Return in 2 months for recheck  Elevate legs when possible  Drastically avoid salt as discussed  Use mineral oil or Vaseline on your legs to try and keep the skin from being so dry and itchy.  If you have any redness or sores you can use an antibiotic ointment, and I would prefer Polysporin to Neosporin.  Return  sooner if needed   If you have lab work done today you will be contacted with your lab results within the next 2 weeks.  If you have not heard from [place] then please contact us. The fastest way to get your results is to register for My Chart.   IF you received an x-ray today, you will receive an invoice from [facility]. Please contact [facility] at [phone] with questions or concerns regarding your invoice.   IF you received labwork today, you will receive an invoice from [place]. Please contact [facility] at [phone] with questions or concerns regarding your invoice.   Our billing staff will not be able to assist you with questions regarding bills from these companies.  You will be contacted with the lab results as soon as they are available. The fastest way to get your results is to activate your My Chart account. Instructions are located on the last page of this paperwork. If you have  not heard from [place] regarding the results in 2 weeks, please contact this office.        Return in about 2 months (around 11/11/2020) for Recheck fluid in legs and labs.   Ruben Reason, MD 09/10/2020

## 2020-10-27 ENCOUNTER — Encounter: Payer: Self-pay | Admitting: Emergency Medicine

## 2020-10-28 ENCOUNTER — Telehealth (INDEPENDENT_AMBULATORY_CARE_PROVIDER_SITE_OTHER): Payer: Medicare Other | Admitting: Emergency Medicine

## 2020-10-28 ENCOUNTER — Encounter: Payer: Self-pay | Admitting: Emergency Medicine

## 2020-10-28 ENCOUNTER — Other Ambulatory Visit: Payer: Self-pay

## 2020-10-28 VITALS — Ht 74.0 in | Wt 300.0 lb

## 2020-10-28 DIAGNOSIS — E785 Hyperlipidemia, unspecified: Secondary | ICD-10-CM

## 2020-10-28 DIAGNOSIS — Z7689 Persons encountering health services in other specified circumstances: Secondary | ICD-10-CM | POA: Diagnosis not present

## 2020-10-28 DIAGNOSIS — I2510 Atherosclerotic heart disease of native coronary artery without angina pectoris: Secondary | ICD-10-CM | POA: Diagnosis not present

## 2020-10-28 DIAGNOSIS — Z86711 Personal history of pulmonary embolism: Secondary | ICD-10-CM

## 2020-10-28 DIAGNOSIS — I10 Essential (primary) hypertension: Secondary | ICD-10-CM | POA: Diagnosis not present

## 2020-10-28 DIAGNOSIS — Z7901 Long term (current) use of anticoagulants: Secondary | ICD-10-CM

## 2020-10-28 DIAGNOSIS — H903 Sensorineural hearing loss, bilateral: Secondary | ICD-10-CM | POA: Diagnosis not present

## 2020-10-28 NOTE — Patient Instructions (Signed)
° ° ° °  If you have lab work done today you will be contacted with your lab results within the next 2 weeks.  If you have not heard from us then please contact us. The fastest way to get your results is to register for My Chart. ° ° °IF you received an x-ray today, you will receive an invoice from [facility]. Please contact [facility] at [phone] with questions or concerns regarding your invoice.  ° °IF you received labwork today, you will receive an invoice from [facility]. Please contact [facility] at [phone] with questions or concerns regarding your invoice.  ° °Our billing staff will not be able to assist you with questions regarding bills from these companies. ° °You will be contacted with the lab results as soon as they are available. The fastest way to get your results is to activate your My Chart account. Instructions are located on the last page of this paperwork. If you have not heard from us regarding the results in 2 weeks, please contact this office. °  ° ° ° °

## 2020-10-28 NOTE — Progress Notes (Signed)
Telemedicine Encounter- SOAP NOTE Established Patient Patient: Home  Provider: Office     This telephone encounter was conducted with the patient's (or proxy's) verbal consent via audio telecommunications: yes/no: Yes Patient was instructed to have this encounter in a suitably private space; and to only have persons present to whom they give permission to participate. In addition, patient identity was confirmed by use of name plus two identifiers (DOB and address).  I discussed the limitations, risks, security and privacy concerns of performing an evaluation and management service by telephone and the availability of in person appointments. I also discussed with the patient that there may be a patient responsible charge related to this service. The patient expressed understanding and agreed to proceed.  I spent a total of TIME; 0 MIN TO 60 MIN: 20 minutes talking with the patient or their proxy.  Chief Complaint  Patient presents with  . Transitions Of Care    Former Dr Pamella Pert  . Fall    On Sunday on the ice injury to left leg    Subjective   [name] is a 75 y.o. male established patient. Telephone visit today to establish care with me.  Used to see Dr. Pamella Pert. Has multiple chronic medical problems including the following: 1.  Hypertension 2.  Coronary artery disease status post multiple stent placements 3.  History of pulmonary thromboembolism, on long-term anticoagulation treatment with Xarelto. 4.  History of dyslipidemia on rosuvastatin 5.  Hard of hearing, bilateral. Complaining of recent fall last week and with injury to left upper leg.  Improving and able to walk using a cane. No other complaints or medical concerns today.  Fully vaccinated against COVID with a booster   HPI   Patient Active Problem List   Diagnosis Date Noted  . History of pulmonary embolism 02/25/2020  . Current use of long term anticoagulation 02/25/2020  . Pulmonary nodules/lesions,  multiple 02/25/2020  . 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 CAD in native artery 08/31/2016  . Essential hypertension 08/31/2016  . Hyperlipidemia LDL goal <70 08/31/2016  . Family history of heart disease in male family member before age 61 08/31/2016  . 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 Generalized anxiety disorder 08/31/2016  . History of colonic polyps 08/31/2016    Past Medical History:  Diagnosis Date  . Arthritis   . CAD S/P percutaneous coronary angioplasty 2011   stents x 3  . Generalized anxiety disorder   . Hard of hearing   . Hyperlipidemia   . Hypertension   . Lower GI bleed 2014   colonscopy was normal  . 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 acetaminophen (TYLENOL) 325 MG tablet Take 2 tablets (650 mg total) by mouth every 6 (six) hours as needed for mild pain (or Fever >/= 101). 12 tablet 0  . Docusate Sodium (COLACE PO) Take by mouth daily.    . furosemide (LASIX) 40 MG tablet Take 1 tablet (40 mg total) by mouth daily. Take 1/2 tab for 5 days before taking whole tab. 90 tablet 1  . lisinopril (ZESTRIL) 10 MG tablet Take 1 tablet (10 mg total) by mouth daily. 90 tablet 1  . rivaroxaban (XARELTO) 20 MG TABS tablet Take 1 tablet (20 mg total) by mouth daily with supper. 90 tablet 1  . rosuvastatin (CRESTOR) 20 MG tablet Take 1 tablet (20 mg total) by mouth daily. 90 tablet 3  .  Sennosides (SENOKOT PO) Take by mouth daily.    Marland Kitchen ALPRAZolam (XANAX) 0.25 MG tablet Take 1 tablet (0.25 mg total) by mouth at bedtime as needed for anxiety. (Patient not taking: Reported on 10/28/2020) 15 tablet 1  . calcium carbonate (TUMS - DOSED IN MG ELEMENTAL CALCIUM) 500 MG chewable tablet Chew 2 tablets (400 mg of elemental calcium total) by mouth every 6 (six) hours as needed for indigestion or heartburn. (Patient not taking: Reported on 10/28/2020) 30 tablet 2  . ondansetron (ZOFRAN) 4 MG tablet  Take 1 tablet (4 mg total) by mouth every 6 (six) hours as needed for nausea. (Patient not taking: Reported on 10/28/2020) 20 tablet 0  . pantoprazole (PROTONIX) 40 MG tablet Take 1 tablet (40 mg total) by mouth daily. (Patient not taking: Reported on 10/28/2020) 90 tablet 1   No current facility-administered medications for this visit.    No Known Allergies  Social History   Socioeconomic History  . Marital status: Married    Spouse name: [name]  . Number of children: 2  . Years of education: 32  . Highest education level: Not on file  Occupational History  . Occupation: retired    Comment: packing/shipping  Tobacco Use  . Smoking status: Former Smoker    Packs/day: 0.30    Types: Cigarettes    Start date: 08/31/1966    Quit date: 08/31/1969    Years since quitting: 51.1  . Smokeless tobacco: Never Used  Vaping Use  . Vaping Use: Never used  Substance and Sexual Activity  . Alcohol use: Yes    Alcohol/week: 3.0 standard drinks    Types: 2 Cans of beer, 1 Shots of liquor per week  . Drug use: No  . Sexual activity: Yes  Other Topics Concern  . Not on file  Social History Narrative  . Not on file   Social Determinants of Health   Financial Resource Strain: Not on file  Food Insecurity: Not on file  Transportation Needs: Not on file  Physical Activity: Not on file  Stress: Not on file  Social Connections: Not on file  Intimate Partner Violence: Not on file    Review of Systems  Constitutional: Negative.  Negative for chills and fever.  HENT: Negative.  Negative for congestion and sore throat.   Respiratory: Negative.  Negative for cough and shortness of breath.   Cardiovascular: Negative.  Negative for chest pain and palpitations.  Gastrointestinal: Negative.  Negative for abdominal pain, blood in stool, melena, nausea and vomiting.  Genitourinary: Negative.   Skin: Negative.  Negative for rash.  Neurological: Negative.  Negative for dizziness and headaches.  All  other systems reviewed and are negative.   Objective  Alert and OX3 in no respiratory distress Vitals as reported by the patient: Today's Vitals   10/28/20 1238  Weight: 300 lb (136.1 kg)  Height: 6\' 2"  (1.88 m)    There are no diagnoses linked to this encounter. Anirudh was seen today for transitions of care and fall.  Diagnoses and all orders for this visit:  Encounter to establish care  History of pulmonary embolism  Essential hypertension  Current use of long term anticoagulation  Dyslipidemia  CAD in native artery  Sensorineural hearing loss (SNHL) of both ears  Clinically stable.  No medical concerns identified during this visit.  Continue present medications.  No changes.  Follow-up in the office in 3 to 6 months.   I discussed the assessment and treatment plan with the patient.  The patient was provided an opportunity to ask questions and all were answered. The patient agreed with the plan and demonstrated an understanding of the instructions.   The patient was advised to call back or seek an in-person evaluation if the symptoms worsen or if the condition fails to improve as anticipated.  I provided 20 minutes of non-face-to-face time during this encounter.  Horald Pollen, MD  Primary Care at [facility]

## 2020-11-01 ENCOUNTER — Encounter: Payer: Self-pay | Admitting: Emergency Medicine

## 2020-11-02 MED ORDER — PANTOPRAZOLE SODIUM 40 MG PO TBEC: 40.0000 mg | DELAYED_RELEASE_TABLET | Freq: Every day | ORAL | 1 refills | Status: DC

## 2020-11-04 ENCOUNTER — Encounter: Payer: Self-pay | Admitting: Emergency Medicine

## 2020-11-05 ENCOUNTER — Other Ambulatory Visit: Payer: Self-pay

## 2020-11-05 ENCOUNTER — Other Ambulatory Visit: Payer: Self-pay | Admitting: Emergency Medicine

## 2020-11-05 MED ORDER — PANTOPRAZOLE SODIUM 40 MG PO TBEC: 40.0000 mg | DELAYED_RELEASE_TABLET | Freq: Every day | ORAL | 1 refills | Status: DC

## 2020-11-05 NOTE — Telephone Encounter (Signed)
Medication Refill - Medication:   pantoprazole (PROTONIX) 40 MG tablet     Preferred Pharmacy (with phone number or street name):  [id number] - [place], [place] Phone:  [phone]  Fax:  [phone]       Agent: Please be advised that RX refills may take up to 3 business days. We ask that you follow-up with your pharmacy.

## 2020-11-05 NOTE — Telephone Encounter (Signed)
Refill sent to pharmacy on 11/05/20.

## 2020-11-09 ENCOUNTER — Encounter: Payer: Self-pay | Admitting: Internal Medicine

## 2020-11-09 ENCOUNTER — Other Ambulatory Visit: Payer: Self-pay

## 2020-11-09 ENCOUNTER — Ambulatory Visit (INDEPENDENT_AMBULATORY_CARE_PROVIDER_SITE_OTHER): Payer: Medicare Other | Admitting: Internal Medicine

## 2020-11-09 VITALS — BP 118/70 | HR 60 | Ht 74.0 in | Wt 307.8 lb

## 2020-11-09 DIAGNOSIS — I2510 Atherosclerotic heart disease of native coronary artery without angina pectoris: Secondary | ICD-10-CM

## 2020-11-09 DIAGNOSIS — I10 Essential (primary) hypertension: Secondary | ICD-10-CM | POA: Diagnosis not present

## 2020-11-09 DIAGNOSIS — E785 Hyperlipidemia, unspecified: Secondary | ICD-10-CM

## 2020-11-09 NOTE — Patient Instructions (Signed)
Medication Instructions:  No changes *If you need a refill on your cardiac medications before your next appointment, please call your pharmacy*   Lab Work: Today: CBC, BMET, LIPIDS  If you have labs (blood work) drawn today and your tests are completely normal, you will receive your results only by: Marland Kitchen MyChart Message (if you have MyChart) OR . A paper copy in the mail If you have any lab test that is abnormal or we need to change your treatment, we will call you to review the results.   Testing/Procedures: none  Follow-Up: At [facility], you and your health needs are our priority.  As part of our continuing mission to provide you with exceptional heart care, we have created designated Provider Care Teams.  These Care Teams include your primary Cardiologist (physician) and Advanced Practice Providers (APPs -  Physician Assistants and Nurse Practitioners) who all work together to provide you with the care you need, when you need it.   Your next appointment:   12 month(s)  The format for your next appointment:   In Person  Provider:   You may see Dorris Carnes, MD or one of the following Advanced Practice Providers on your designated Care Team:    Richardson Dopp, PA-C  Robbie Lis, [place]   Other Instructions

## 2020-11-09 NOTE — Progress Notes (Signed)
Cardiology Office Note   Date:  11/09/2020   ID:  [name], DOB [birth year], MRN [id number]  PCP:  Blanchie Serve, MD  Cardiologist:   Dorris Carnes, MD   PT presents for f/u of CAD    History of Present Illness: [name] is a 75 y.o. male with a history of CAD  Cath in 2010:  Mild dz LAD; 70% OM lesion:  RCA: 60 to 70% prox RCA; 95% distal RCA.  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 acetaminophen (TYLENOL) 325 MG tablet Take 2 tablets (650 mg total) by mouth every 6 (six) hours as needed for mild pain (or Fever >/= 101).  Marland Kitchen ALPRAZolam (XANAX) 0.25 MG tablet Take 1 tablet (0.25 mg total) by mouth at bedtime as needed for anxiety.  Mariane Baumgarten Sodium (COLACE PO) Take by mouth daily.  . furosemide (LASIX) 40 MG tablet Take 1 tablet (40 mg total) by mouth daily. Take 1/2 tab for 5 days before taking whole tab.  . lisinopril (ZESTRIL) 10 MG tablet Take 1 tablet (10 mg total) by mouth daily.  . pantoprazole (PROTONIX) 40 MG tablet Take 1 tablet (40 mg total) by mouth daily.  . rivaroxaban (XARELTO) 20 MG TABS tablet Take 1 tablet (20 mg total) by mouth daily with supper.  . rosuvastatin (CRESTOR) 20 MG tablet Take 1 tablet (20  mg total) by mouth daily.  . Sennosides (SENOKOT PO) Take by mouth daily.     Allergies:   Patient has no known allergies.   Past Medical History:  Diagnosis Date  . Arthritis   . CAD S/P percutaneous coronary angioplasty 2011   stents x 3  . Generalized anxiety disorder   . Hard of hearing   . Hyperlipidemia   . Hypertension   . Lower GI bleed 2014   colonscopy was normal  . 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 CORONARY ANGIOPLASTY WITH STENT PLACEMENT    . FRACTURE SURGERY  1995   right elbow  . KNEE ARTHROSCOPY W/ MENISCAL REPAIR       Social History:  The patient  reports that he quit smoking about 51 years ago. His smoking use included cigarettes. He started smoking about 54 years ago. He smoked 0.30 packs per day. He  has never used smokeless tobacco. He reports current alcohol use of about 3.0 standard drinks of alcohol per week. He reports that he does not use drugs.   Family History:  The patient's family history includes Alcohol abuse in his paternal uncle; Asthma in his daughter; Cancer in his paternal grandfather; Clotting disorder in his father; Heart disease (age of onset: 69) in his mother; Hyperlipidemia in his daughter; Lung disease in his father.    ROS:  Please see the history of present illness. All other systems are reviewed and  Negative to the above problem except as noted.    PHYSICAL EXAM: VS:  BP 118/70   Pulse 60   Ht 6\' 2"  (1.88 m)   Wt (!) 307 lb 12.8 oz (139.6 kg)   BMI 39.52 kg/m   GEN: Morbidly obese 75 yo  in no acute distress  HEENT: normal  Neck: no JVD, NO carotid bruits Cardiac: RRR; no murmursno edema  Respiratory:  clear to auscultation bilaterally,  GI: soft, nontender, distended, + BS  No hepatomegaly  MS: no deformity Moving all extremities   Skin: warm and dry, no rash Neuro:  Strength and sensation are intact Psych: euthymic mood, full affect   EKG:  EKG is  ordered today.  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

## 2020-11-10 LAB — CBC
Hematocrit: 46.8 % (ref 37.5–51.0)
Hemoglobin: 15.2 g/dL (ref 13.0–17.7)
MCH: 28.9 pg (ref 26.6–33.0)
MCHC: 32.5 g/dL (ref 31.5–35.7)
MCV: 89 fL (ref 79–97)
Platelets: 270 10*3/uL (ref 150–450)
RBC: 5.26 x10E6/uL (ref 4.14–5.80)
RDW: 13.6 % (ref 11.6–15.4)
WBC: 7.5 10*3/uL (ref 3.4–10.8)

## 2020-11-10 LAB — LIPID PANEL
Chol/HDL Ratio: 2.6 ratio (ref 0.0–5.0)
Cholesterol, Total: 125 mg/dL (ref 100–199)
HDL: 49 mg/dL (ref >39)
LDL Chol Calc (NIH): 56 mg/dL (ref 0–99)
Triglycerides: 113 mg/dL (ref 0–149)
VLDL Cholesterol Cal: 20 mg/dL (ref 5–40)

## 2020-11-10 LAB — BASIC METABOLIC PANEL
BUN/Creatinine Ratio: 22 (ref 10–24)
BUN: 22 mg/dL (ref 8–27)
CO2: 24 mmol/L (ref 20–29)
Calcium: 10 mg/dL (ref 8.6–10.2)
Chloride: 107 mmol/L — ABNORMAL HIGH (ref 96–106)
Creatinine, Ser: 0.98 mg/dL (ref 0.76–1.27)
GFR calc Af Amer: 89 mL/min/{1.73_m2} (ref >59)
GFR calc non Af Amer: 77 mL/min/{1.73_m2} (ref >59)
Glucose: 99 mg/dL (ref 65–99)
Potassium: 4.8 mmol/L (ref 3.5–5.2)
Sodium: 146 mmol/L — ABNORMAL HIGH (ref 134–144)
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
## 2021-01-05 NOTE — Progress Notes (Signed)
* * *         [facility]**        ---    Lawernce Ion. Sindy Messing, MD [facility] Kieth Brightly. Donnetta Simpers, MD [facility] Barbera Setters Byrd Hesselbach, MD  [facility];    Darlyn Chamber, MD [facility] Roosvelt Maser, MD [facility] Shanon Brow, MD [facility]  Nile Dear. Audley Hose, MD [facility]    Kandis Mannan A. Karie Mainland, MD [facility] Johnell Comings. MacNaught, MD [place] Mitchel Honour, MD Erma Pinto, MD [facility]    Colletta Maryland, MD Weston Anna, MD [facility] Kerby Moors, NP Maximino Greenland ,  NP        * * *     **Patient Name:** Jose Lindsey   **Date:** 04/18/2016    --- ---     **DOB:** [birth year]     **Referring Provider:** ANN Vincenza Hews   **Appointment Provider:** Nurse Savaughn Karwowski        * * *    04/18/2016  Progress Notes: Nurse Yohana Bartha    --- ---    ---        Current Medications    ---    Taking     * hydrochlorothiazide 12.5 mg capsule 1 cap(s) orally once a day    ---    * desoximetasone topical 0.25% cream 1 app applied topically 2 times a day    ---    * diphenhydramine 25 mg capsule 1 cap(s) orally Q.D. bedtime    ---    * lisinopril 20 mg tablet 1 tab(s) orally once a day    ---    * clopidogrel 75 mg tablet 1 tab(s) orally once a day    ---    * atorvastatin (Lipitor) 80 mg tablet 1 tab(s) orally once a day (at bedtime)    ---    * aspirin 81 mg enteric coated tablet 1 tab(s) orally once a day    ---    * Colace sodium 100 mg capsule 1 cap(s) orally once a day    ---      Past Medical History    ---      CAD    ---    - angina (2010): RCA- BMS x 2, OM 60%    ---    CHOL    ---    Obesity    ---    Diverticular disease    ---    GIB (2015)    ---        Reason for Appointment    ---      1\. Per dr Karie Mainland    ---    2\. BP check    ---      History of Present Illness    ---     __:    Stiles is a 75 yr old man Hx of HTN, CAD, hyperlipidemia, obesity last seen  on 04/04/2016. At his last visit, BP was suboptimal and HCTZ 12.5 mg was  prescribed. Reports feeling well from a cardiac perspective. Does c/o  lightheadness when first started taking the  HCTZ but has decreased. He states  has a had 6 lb weight loss and has been limiting the amount of salt he puts on  food. Denies cp, sob, n/v, or swelling.      Vital Signs    ---    HR 58, Ht 73, O2 Sat 97%, Right Arm BP 110/74, Left Arm BP 110/76.      Past Orders    ---  _Lab:BMP (Order Date - 04/11/2016) (Collection Date - 04/11/2016)_    BUN  21  H  7-18 - mg/dL    Creatinine  9.147    0.550-1.300 - mg/dL    Sodium Lvl  829    562-130 - mmol/L    Potassium lvl  4.3    3.6-5.2 - mmol/L    Chloride  102    98-107 - mmol/L    CO2  30    21-32 - mmol/L    Calcium Lvl  9.3    8.5-10.1 - mg/dL    Glucose Lvl  88    86-578 - mg/dL    Anion Gap  6    4-69 -    _Lab:Glomerular Filtration Rate (Estimated)-[facility] (Order Date - 04/11/2016)  (Collection Date - 04/11/2016)_    Afn Amer GFR  88     \- ml/min    Non-Afn Amer GFR  76     \- ml/min      Examination    ---     _HS_ :    General: Well developed and nourished. Pulmonary System clear to auscultation  bilaterally. Cardiac: no murmur, gallop or pericardial rub. Extremities no  clubbing, no edema. Skin: normal, no rash. Neurologic exam: grossly non-focal.  Pyschiatric appropriate.          Impression/Recommendations    ---       **1\. Essential Hypertension**    Clinical Notes: [name] comes into the office today for a BP check. Since his  last visit on 04/04/2016, HCTZ 12.5 mg was prescribed. Reports feeling well.  Recent blood work reviewed with pt. Congratulated pt on 6 lb. weight loss. His  BP today in the office manual cuff right arm 110/74 left arm 110/76 HR 58. I  advised [name] to cont with all prescribed medications and follow up with Dr.  Karie Mainland as scheduled. Discussed the effects of dietary sodium intake and  importance of exercise. Cannan reports has a BP cuff at home and advised to  self monitor BP at home.    ---        **2\. Others**    Notes: Continue to take all your prescribed medications and follow up as  scheduled. Monitor BP at home and if BP trends  over 140/90 to notify the  office. Continue with the success of weight loss. Great job!.    Electronically signed by Teresa Pelton MD on 04/19/2016 at 07:08 AM EDT    Sign off status: Completed        * * *        [facility]    [address]    [place], [place] [address]    Tel: [phone]    Fax: [phone]              * * *          Patient: Jose Lindsey DOB: 02-27-1946 Progress Note: Nurse Victorio Palm  04/18/2016    ---    Note generated by eClinicalWorks EMR/PM Software ([email or web address])

## 2021-01-05 NOTE — Progress Notes (Signed)
* * *        **  Jose Lindsey    --- ---    7 Y old Male, DOB: 09-02-1946    [address] [place], [place], [place] [address]    Home: [phone]    Provider: Santo Held, MD        * * *    Telephone Encounter    ---    Answered by   Kirke Shaggy  Date: 06/17/2016         Time: 09:14 AM    Reason   refill    --- ---            Refills  Continue atorvastatin (Lipitor) tablet, 80 mg, orally, 90 tablets, 1  tab(s), once a day (at bedtime), 90 days, Refills=3    --- ---          * * *                ---          * * *          Patient: Jose Lindsey, Jose Lindsey DOB: 25-Aug-1946 Provider: Santo Held, MD  06/17/2016    ---    Note generated by eClinicalWorks EMR/PM Software ([email or web address])

## 2021-01-31 ENCOUNTER — Other Ambulatory Visit: Payer: Self-pay | Admitting: Family Medicine

## 2021-01-31 ENCOUNTER — Encounter: Payer: Self-pay | Admitting: Emergency Medicine

## 2021-02-01 NOTE — Telephone Encounter (Signed)
Please advise 

## 2021-02-02 ENCOUNTER — Other Ambulatory Visit: Payer: Self-pay

## 2021-02-02 MED ORDER — LISINOPRIL 10 MG PO TABS: 10.0000 mg | ORAL_TABLET | Freq: Every day | ORAL | 1 refills | Status: DC

## 2021-02-02 NOTE — Telephone Encounter (Signed)
Refilled lisinopril

## 2021-02-18 ENCOUNTER — Encounter: Payer: Self-pay | Admitting: Emergency Medicine

## 2021-02-18 ENCOUNTER — Other Ambulatory Visit: Payer: Self-pay

## 2021-02-18 ENCOUNTER — Ambulatory Visit (INDEPENDENT_AMBULATORY_CARE_PROVIDER_SITE_OTHER): Payer: Medicare Other | Admitting: Emergency Medicine

## 2021-02-18 VITALS — BP 124/62 | HR 60 | Temp 98.4°F | Ht 74.0 in | Wt 311.0 lb

## 2021-02-18 DIAGNOSIS — Z86711 Personal history of pulmonary embolism: Secondary | ICD-10-CM | POA: Diagnosis not present

## 2021-02-18 DIAGNOSIS — I2510 Atherosclerotic heart disease of native coronary artery without angina pectoris: Secondary | ICD-10-CM | POA: Diagnosis not present

## 2021-02-18 DIAGNOSIS — H903 Sensorineural hearing loss, bilateral: Secondary | ICD-10-CM

## 2021-02-18 DIAGNOSIS — E785 Hyperlipidemia, unspecified: Secondary | ICD-10-CM

## 2021-02-18 DIAGNOSIS — Z7901 Long term (current) use of anticoagulants: Secondary | ICD-10-CM | POA: Diagnosis not present

## 2021-02-18 DIAGNOSIS — C4359 Malignant melanoma of other part of trunk: Secondary | ICD-10-CM | POA: Diagnosis not present

## 2021-02-18 DIAGNOSIS — R918 Other nonspecific abnormal finding of lung field: Secondary | ICD-10-CM | POA: Diagnosis not present

## 2021-02-18 DIAGNOSIS — I10 Essential (primary) hypertension: Secondary | ICD-10-CM

## 2021-02-18 DIAGNOSIS — F411 Generalized anxiety disorder: Secondary | ICD-10-CM

## 2021-02-18 NOTE — Assessment & Plan Note (Signed)
Diet and nutrition discussed.  Recommended to lose some weight and get under 300 pounds range.  Continue rosuvastatin 20 mg daily

## 2021-02-18 NOTE — Assessment & Plan Note (Addendum)
Stable without episodes of angina.  Last cardiology visit reviewed.

## 2021-02-18 NOTE — Assessment & Plan Note (Signed)
No clinical bleeding.  Fall precautions given.  Continue Xarelto 10 mg daily.

## 2021-02-18 NOTE — Assessment & Plan Note (Signed)
Well-controlled hypertension.  Continue furosemide 40 mg and lisinopril 10 mg daily.

## 2021-02-18 NOTE — Assessment & Plan Note (Signed)
Stable.  Good quality of life.  No concerns.

## 2021-02-18 NOTE — Patient Instructions (Signed)
Health Maintenance After Age 73 After age 73, you are at a higher risk for certain long-term diseases and infections as well as injuries from falls. Falls are a major cause of broken bones and head injuries in people who are older than age 73. Getting regular preventive care can help to keep you healthy and well. Preventive care includes getting regular testing and making lifestyle changes as recommended by your health care provider. Talk with your health care provider about:  Which screenings and tests you should have. A screening is a test that checks for a disease when you have no symptoms.  A diet and exercise plan that is right for you. What should I know about screenings and tests to prevent falls? Screening and testing are the best ways to find a health problem early. Early diagnosis and treatment give you the best chance of managing medical conditions that are common after age 73. Certain conditions and lifestyle choices may make you more likely to have a fall. Your health care provider may recommend:  Regular vision checks. Poor vision and conditions such as cataracts can make you more likely to have a fall. If you wear glasses, make sure to get your prescription updated if your vision changes.  Medicine review. Work with your health care provider to regularly review all of the medicines you are taking, including over-the-counter medicines. Ask your health care provider about any side effects that may make you more likely to have a fall. Tell your health care provider if any medicines that you take make you feel dizzy or sleepy.  Osteoporosis screening. Osteoporosis is a condition that causes the bones to get weaker. This can make the bones weak and cause them to break more easily.  Blood pressure screening. Blood pressure changes and medicines to control blood pressure can make you feel dizzy.  Strength and balance checks. Your health care provider may recommend certain tests to check your  strength and balance while standing, walking, or changing positions.  Foot health exam. Foot pain and numbness, as well as not wearing proper footwear, can make you more likely to have a fall.  Depression screening. You may be more likely to have a fall if you have a fear of falling, feel emotionally low, or feel unable to do activities that you used to do.  Alcohol use screening. Using too much alcohol can affect your balance and may make you more likely to have a fall. What actions can I take to lower my risk of falls? General instructions  Talk with your health care provider about your risks for falling. Tell your health care provider if: ? You fall. Be sure to tell your health care provider about all falls, even ones that seem minor. ? You feel dizzy, sleepy, or off-balance.  Take over-the-counter and prescription medicines only as told by your health care provider. These include any supplements.  Eat a healthy diet and maintain a healthy weight. A healthy diet includes low-fat dairy products, low-fat (lean) meats, and fiber from whole grains, beans, and lots of fruits and vegetables. Home safety  Remove any tripping hazards, such as rugs, cords, and clutter.  Install safety equipment such as grab bars in bathrooms and safety rails on stairs.  Keep rooms and walkways well-lit. Activity  Follow a regular exercise program to stay fit. This will help you maintain your balance. Ask your health care provider what types of exercise are appropriate for you.  If you need a cane or walker,   use it as recommended by your health care provider.  Wear supportive shoes that have nonskid soles.   Lifestyle  Do not drink alcohol if your health care provider tells you not to drink.  If you drink alcohol, limit how much you have: ? 0-1 drink a day for women. ? 0-2 drinks a day for men.  Be aware of how much alcohol is in your drink. In the [place] one drink equals one typical bottle of beer (12  oz), one-half glass of wine (5 oz), or one shot of hard liquor (1 oz).  Do not use any products that contain nicotine or tobacco, such as cigarettes and e-cigarettes. If you need help quitting, ask your health care provider. Summary  Having a healthy lifestyle and getting preventive care can help to protect your health and wellness after age 73.  Screening and testing are the best way to find a health problem early and help you avoid having a fall. Early diagnosis and treatment give you the best chance for managing medical conditions that are more common for people who are older than age 73.  Falls are a major cause of broken bones and head injuries in people who are older than age 73. Take precautions to prevent a fall at home.  Work with your health care provider to learn what changes you can make to improve your health and wellness and to prevent falls. This information is not intended to replace advice given to you by your health care provider. Make sure you discuss any questions you have with your health care provider. Document Revised: 01/17/2019 Document Reviewed: 08/09/2017 Elsevier Patient Education  2021 [employer].  

## 2021-02-18 NOTE — Progress Notes (Signed)
[name] 75 y.o.   Chief Complaint  Patient presents with  . Establish Care    Pt here to establish care    HISTORY OF PRESENT ILLNESS: A1A This is a 75 y.o. male here for follow-up of chronic medical problems. Has multiple chronic medical problems including the following: 1.  Hypertension: Presently on lisinopril 10 mg daily and furosemide 40 mg daily. 2.  Coronary artery disease status post multiple stent placements 3.  History of pulmonary thromboembolism, on long-term anticoagulation treatment with Xarelto. 4.  History of dyslipidemia on rosuvastatin 20 mg daily 5.  Hard of hearing, bilateral No complaints or medical concerns today.  Accompanied by his wife. Last cardiology visit assessment and plan as follows: ASSESSMENT AND PLAN:   1  PE   Pt with massive PE this winter   No predisposing  cause   He does say his father had a PE   Pt on chronic Xarelto   Will check labs today    2  CAD  No symptoms of angina   Follow     2  HL  Will recheck lpids   Continue Crestor     3  HTN  BP is controlled    4  Obesity  Discussed sugar in diet      F/u in Dec 2022     Signed, Dorris Carnes, MD  11/09/2020 11:11 AM    [address], [place], [place]  [address] Phone: [phone]; Fax: [phone] Recent echocardiogram report as follows: IMPRESSIONS     1. Left ventricular ejection fraction, by visual estimation, is 50 to  55%. The left ventricle has mildly decreased function. There is no left  ventricular hypertrophy.   2. Definity contrast agent was given IV to delineate the left ventricular  endocardial borders.   3. Elevated left ventricular end-diastolic pressure.   4. Left ventricular diastolic parameters are consistent with Grade I  diastolic dysfunction (impaired relaxation).   5. Mild hypokinesis of the inferoseptum and basal anteroseptum.   6. Global right ventricle has mildly reduced systolic function.The right   ventricular size is normal. No increase in right ventricular wall  thickness.   7. Left atrial size was normal.   8. Right atrial size was normal.   9. The mitral valve is normal in structure. No evidence of mitral valve  regurgitation. No evidence of mitral stenosis.  10. The tricuspid valve is normal in structure. Tricuspid valve  regurgitation is trivial.  11. The aortic valve is tricuspid. Aortic valve regurgitation is not  visualized. No evidence of aortic valve sclerosis or stenosis.  12. The pulmonic valve was normal in structure. Pulmonic valve  regurgitation is not visualized.    HPI   Prior to Admission medications   Medication Sig Start Date End Date Taking? Authorizing Provider  acetaminophen (TYLENOL) 325 MG tablet Take 2 tablets (650 mg total) by mouth every 6 (six) hours as needed for mild pain (or Fever >/= 101). 08/24/19  Yes Emokpae, Courage, MD  ALPRAZolam (XANAX) 0.25 MG tablet Take 1 tablet (0.25 mg total) by mouth at bedtime as needed for anxiety. 11/26/19  Yes Jacelyn Pi, Lilia Argue, MD  Docusate Sodium (COLACE PO) Take by mouth daily.   Yes [provider]  furosemide (LASIX) 40 MG tablet Take 1 tablet (40 mg total) by mouth daily. Take 1/2 tab for 5 days before taking whole tab. 09/10/20  Yes Posey Boyer, MD  lisinopril (  ZESTRIL) 10 MG tablet Take 1 tablet (10 mg total) by mouth daily. 02/02/21  Yes Kamara Allan, Ines Bloomer, MD  pantoprazole (PROTONIX) 40 MG tablet Take 1 tablet (40 mg total) by mouth daily. 11/05/20 11/05/21 Yes Posey Boyer, MD  rivaroxaban (XARELTO) 20 MG TABS tablet Take 1 tablet (20 mg total) by mouth daily with supper. 09/10/20  Yes Posey Boyer, MD  rosuvastatin (CRESTOR) 20 MG tablet Take 1 tablet (20 mg total) by mouth daily. 02/25/20  Yes Jacelyn Pi, Lilia Argue, MD  Sennosides (SENOKOT PO) Take by mouth daily.   Yes [provider]    No Known Allergies  Patient Active Problem List   Diagnosis Date Noted  .  History of pulmonary embolism 02/25/2020  . Current use of long term anticoagulation 02/25/2020  . 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 CAD in native artery 08/31/2016  . Essential hypertension 08/31/2016  . Hyperlipidemia LDL goal <70 08/31/2016  . Family history of heart disease in male family member before age 62 08/31/2016  . 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 Generalized anxiety disorder 08/31/2016  . History of colonic polyps 08/31/2016    Past Medical History:  Diagnosis Date  . Arthritis   . CAD S/P percutaneous coronary angioplasty 2011   stents x 3  . Generalized anxiety disorder   . Hard of hearing   . Hyperlipidemia   . Hypertension   . Lower GI bleed 2014   colonscopy was normal  . 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 CORONARY ANGIOPLASTY WITH STENT PLACEMENT    . FRACTURE SURGERY  1995   right elbow  . KNEE ARTHROSCOPY W/ MENISCAL REPAIR      Social History   Socioeconomic History  . Marital status: Married    Spouse name: rose  . Number of children: 2  . Years of education: 26  . Highest education level: Not on file  Occupational History  . Occupation: retired    Comment: packing/shipping  Tobacco Use  . Smoking status: Former Smoker    Packs/day: 0.30    Types: Cigarettes    Start date: 08/31/1966    Quit date: 08/31/1969    Years since quitting: 51.5  . Smokeless tobacco: Never Used  Vaping Use  . Vaping Use: Never used  Substance and Sexual Activity  . Alcohol use: Yes    Alcohol/week: 3.0 standard drinks    Types: 2 Cans of beer, 1 Shots of liquor per week  . Drug use: No  . Sexual activity: Yes  Other Topics Concern  . Not on file  Social History Narrative  . Not on file   Social Determinants of Health   Financial Resource Strain: Not on file  Food Insecurity: Not on file  Transportation Needs: Not on  file  Physical Activity: Not on file  Stress: Not on file  Social Connections: Not on file  Intimate Partner Violence: Not on file    Family History  Problem Relation Age of Onset  . Heart disease Mother 75       heart attack  . Lung disease Father        asbestosis  . Clotting disorder Father   . Asthma Daughter   . Hyperlipidemia Daughter   . Alcohol abuse Paternal Uncle   . Cancer Paternal Grandfather        lung  Review of Systems  Constitutional: Negative.  Negative for chills and fever.  HENT: Negative.  Negative for congestion and sore throat.   Respiratory: Negative.  Negative for cough and shortness of breath.   Cardiovascular: Negative.  Negative for chest pain and palpitations.  Gastrointestinal: Negative.  Negative for abdominal pain, blood in stool, diarrhea, melena, nausea and vomiting.  Genitourinary: Negative.  Negative for dysuria and hematuria.  Musculoskeletal: Positive for joint pain.  Skin: Negative.  Negative for rash.  Neurological: Negative.  Negative for dizziness and headaches.  All other systems reviewed and are negative.   Today's Vitals   02/18/21 1005  BP: 124/62  Pulse: 60  Temp: 98.4 F (36.9 C)  TempSrc: Oral  SpO2: 97%  Weight: (!) 311 lb (141.1 kg)  Height: 6\' 2"  (1.88 m)   Body mass index is 39.93 kg/m. Wt Readings from Last 3 Encounters:  02/18/21 (!) 311 lb (141.1 kg)  11/09/20 (!) 307 lb 12.8 oz (139.6 kg)  10/28/20 300 lb (136.1 kg)    Physical Exam Vitals reviewed.  Constitutional:      Appearance: Normal appearance.  HENT:     Head: Normocephalic.  Eyes:     Extraocular Movements: Extraocular movements intact.     Pupils: Pupils are equal, round, and reactive to light.  Cardiovascular:     Rate and Rhythm: Normal rate and regular rhythm.     Pulses: Normal pulses.     Heart sounds: Normal heart sounds.  Pulmonary:     Effort: Pulmonary effort is normal.     Breath sounds: Normal breath sounds.   Musculoskeletal:        General: Normal range of motion.     Cervical back: Normal range of motion and neck supple.     Right lower leg: No edema.     Left lower leg: No edema.  Skin:    General: Skin is warm and dry.     Capillary Refill: Capillary refill takes less than 2 seconds.  Neurological:     General: No focal deficit present.     Mental Status: He is alert and oriented to person, place, and time.  Psychiatric:        Mood and Affect: Mood normal.        Behavior: Behavior normal.      ASSESSMENT & PLAN: A total of 30 minutes was spent with the patient and counseling/coordination of care regarding multiple chronic medical problems and cardiovascular risks associated with these conditions, review of all medications, education on nutrition, review of most recent office visit note, review of most recent cardiologist office visit note, review of most recent echocardiogram results, health maintenance items, fall precautions and increased risk of internal bleeding due to Xarelto, arthritic pain and need to avoid aspirin and NSAIDs, use only Tylenol as needed, prognosis, documentation, need for follow-up.  CAD in native artery Stable without episodes of angina.  Last cardiology visit reviewed.  Essential hypertension Well-controlled hypertension.  Continue furosemide 40 mg and lisinopril 10 mg daily.  Current use of long term anticoagulation No clinical bleeding.  Fall precautions given.  Continue Xarelto 10 mg daily.  Hyperlipidemia LDL goal <70 Diet and nutrition discussed.  Recommended to lose some weight and get under 300 pounds range.  Continue rosuvastatin 20 mg daily  Generalized anxiety disorder Stable.  Good quality of life.  No concerns.  Roland was seen today for establish care.  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 Falls are a major cause of broken bones and head injuries in people who are older than age 41. Getting regular preventive care can help to keep you healthy and well. Preventive care includes getting regular testing and making lifestyle changes as recommended by your health care provider. Talk with your health care provider about:  Which screenings and tests you should have. A screening is a test that checks for a disease when you have no symptoms.  A diet and exercise plan that is right for you. What should I know about screenings and tests to prevent falls? Screening and testing are the best ways to find a health problem early. Early diagnosis and treatment give you the best chance of managing medical conditions that are common after age 17. Certain conditions and lifestyle choices may make you more likely to have a fall. Your health care provider may recommend:  Regular vision checks. Poor vision and conditions such as cataracts can make you more likely to have a fall. If you wear glasses, make sure to get your prescription updated if your vision changes.  Medicine review. Work with your health care provider to regularly review all of the medicines you are taking, including over-the-counter medicines. Ask your health care provider about any side effects that may make you more likely to have a fall. Tell  your health care provider if any medicines that you take make you feel dizzy or sleepy.  Osteoporosis screening. Osteoporosis is a condition that causes the bones to get weaker. This can make the bones weak and cause them to break more easily.  Blood pressure screening. Blood pressure changes and medicines to control blood pressure can make you feel dizzy.  Strength and balance checks. Your health care provider may recommend certain tests to check your strength and balance while standing, walking, or changing positions.  Foot health exam. Foot pain and numbness, as well as not wearing proper footwear, can make you more likely to have a fall.  Depression screening. You may be more likely to have a fall if you have a fear of falling, feel emotionally low, or feel unable to do activities that you used to do.  Alcohol use screening. Using too much alcohol can affect your balance and may make you more likely to have a fall. What actions can I take to lower my risk of falls? General instructions  Talk with your health care provider about your risks for falling. Tell your health care provider if: ? You fall. Be sure to tell your health care provider about all falls, even ones that seem minor. ? You feel dizzy, sleepy, or off-balance.  Take over-the-counter and prescription medicines only as told by your health care provider. These include  any supplements.  Eat a healthy diet and maintain a healthy weight. A healthy diet includes low-fat dairy products, low-fat (lean) meats, and fiber from whole grains, beans, and lots of fruits and vegetables. Home safety  Remove any tripping hazards, such as rugs, cords, and clutter.  Install safety equipment such as grab bars in bathrooms and safety rails on stairs.  Keep rooms and walkways well-lit. Activity  Follow a regular exercise program to stay fit. This will help you maintain your balance. Ask your health care provider what types of exercise are  appropriate for you.  If you need a cane or walker, use it as recommended by your health care provider.  Wear supportive shoes that have nonskid soles.   Lifestyle  Do not drink alcohol if your health care provider tells you not to drink.  If you drink alcohol, limit how much you have: ? 0-1 drink a day for women. ? 0-2 drinks a day for men.  Be aware of how much alcohol is in your drink. In the [place] one drink equals one typical bottle of beer (12 oz), one-half glass of wine (5 oz), or one shot of hard liquor (1 oz).  Do not use any products that contain nicotine or tobacco, such as cigarettes and e-cigarettes. If you need help quitting, ask your health care provider. Summary  Having a healthy lifestyle and getting preventive care can help to protect your health and wellness after age 23.  Screening and testing are the best way to find a health problem early and help you avoid having a fall. Early diagnosis and treatment give you the best chance for managing medical conditions that are more common for people who are older than age 56.  Falls are a major cause of broken bones and head injuries in people who are older than age 25. Take precautions to prevent a fall at home.  Work with your health care provider to learn what changes you can make to improve your health and wellness and to prevent falls. This information is not intended to replace advice given to you by your health care provider. Make sure you discuss any questions you have with your health care provider. Document Revised: 01/17/2019 Document Reviewed: 08/09/2017 Elsevier Patient Education  2021 [place], MD [facility]

## 2021-02-19 ENCOUNTER — Telehealth: Payer: Self-pay | Admitting: *Deleted

## 2021-02-19 NOTE — Chronic Care Management (AMB) (Signed)
  Chronic Care Management   Outreach Note  02/19/2021 Name: [name] MRN: [id number] DOB: 15-Jan-1946  [name] is a 75 y.o. year old male who is a primary care patient of Sagardia, Ines Bloomer, MD. I reached out to [name] by phone today in response to a referral sent by Mr. [name] PCP, Horald Pollen, MD.     An unsuccessful telephone outreach was attempted today. The patient was referred to the case management team for assistance with care management and care coordination.   Follow Up Plan: A HIPAA compliant phone message was left for the patient providing contact information and requesting a return call. The care management team will reach out to the patient again over the next 7 days. If patient returns call to provider office, please advise to call [place] at [phone].  [place] [facility]

## 2021-02-25 NOTE — Chronic Care Management (AMB) (Signed)
  Chronic Care Management   Note  02/25/2021 Name: [name] MRN: [id number] DOB: 1946/05/24  [name] is a 75 y.o. year old male who is a primary care patient of Sagardia, Ines Bloomer, MD. I reached out to Allen Kell by phone today in response to a referral sent by [name] PCP, Horald Pollen, MD.  [name] was given information about Chronic Care Management services today including:  1. CCM service includes personalized support from designated clinical staff supervised by his physician, including individualized plan of care and coordination with other care providers 2. 24/7 contact phone numbers for assistance for urgent and routine care needs. 3. Service will only be billed when office clinical staff spend 20 minutes or more in a month to coordinate care. 4. Only one practitioner may furnish and bill the service in a calendar month. 5. The patient may stop CCM services at any time (effective at the end of the month) by phone call to the office staff. 6. The patient will be responsible for cost sharing (co-pay) of up to 20% of the service fee (after annual deductible is met).  [name] DPR on file verbally agreed to assistance and services provided by embedded care coordination/care management team today.  Follow up plan: Telephone appointment with care management team member scheduled for:03/02/2021  [place] [facility]

## 2021-03-02 ENCOUNTER — Ambulatory Visit (INDEPENDENT_AMBULATORY_CARE_PROVIDER_SITE_OTHER): Payer: Medicare Other | Admitting: *Deleted

## 2021-03-02 DIAGNOSIS — E785 Hyperlipidemia, unspecified: Secondary | ICD-10-CM

## 2021-03-02 DIAGNOSIS — I10 Essential (primary) hypertension: Secondary | ICD-10-CM

## 2021-03-02 NOTE — Patient Instructions (Signed)
Visit Information   [name] and [name], it was nice talking with you today   Please read over the attached information, and start now to begin monitoring and writing down your blood pressure and weights at home, one-two times per week    I look forward to talking to you again for an update on Tuesday April 13, 2021 at 10:00 am- please be listening out for my call that day.  I will call as close to 10:00 am as possible; I look forward to hearing about your progress.   Please don't hesitate to contact me if I can be of assistance to you before our next scheduled appointment.   Oneta Rack, RN, BSN, [facility] RN Care Coordination- [place] [phone]: direct office [phone]: mobile    PATIENT GOALS:  Goals Addressed            This Visit's Progress   . Lifestyle Change-Hypertension   On track    Timeframe:  Long-Range Goal Priority:  Medium Start Date:       03/02/21                      Expected End Date:   03/02/22                    Follow Up Date 04/13/21    . Learn about high blood pressure and high cholesterol . Agree on reward when goals are met around your desire to lose weight . Ask questions to understand    Why is this important?    The changes that you are asked to make may be hard to do.   This is especially true when the changes are life-long.   Knowing why it is important to you is the first step.   Working on the change with your family or support person helps you not feel alone.   Reward yourself and family or support person when goals are met. This can be an activity you choose like bowling, hiking, biking, swimming or shooting hoops.         . Track and Manage My Blood Pressure-Hypertension   On track    Timeframe:  Long-Range Goal Priority:  Medium Start Date:          03/02/21                   Expected End Date:     03/02/22                  Follow Up Date 04/13/21   . Check blood pressure weekly and write  the results down on paper in a dedicated log, calendar or diary- we will review these with each telephone appointment  . Monitor your weights at home 1- 2 times per week and write the results down on paper in a dedicated log, calendar or diary- we will review these with each telephone appointment . Continue following a heart healthy, low sodium diet . Continue taking your medications as instructed . If possible,try to take steps to gradually increase your activity by walking short distances . Read over the enclosed educational material- we will talk about these with our next telephone appointment   Why is this important?    You won't feel high blood pressure, but it can still hurt your blood vessels.   High blood pressure can cause heart or kidney problems. It can also cause a stroke.  Making lifestyle changes like losing a little weight or eating less salt will help.   Checking your blood pressure at home and at different times of the day can help to control blood pressure.   If the doctor prescribes medicine remember to take it the way the doctor ordered.   Call the office if you cannot afford the medicine or if there are questions about it.            Critical care medicine: Principles of diagnosis and management in the adult (4th ed., pp. 4196-2229). Saunders."> Fabel's anesthesia (8th ed., pp. 232-250). Saunders.">  Advance Directive  Advance directives are legal documents that allow you to make decisions about your health care and medical treatment in case you become unable to communicate for yourself. Advance directives let your wishes be known to family, friends, and health care providers. Discussing and writing advance directives should happen over time rather than all at once. Advance directives can be changed and updated at any time. There are different types of advance directives, such as:  Medical power of attorney.  Living will.  Do not resuscitate (DNR) order or  do not attempt resuscitation (DNAR) order. Health care proxy and medical power of attorney A health care proxy is also called a health care agent. This person is appointed to make medical decisions for you when you are unable to make decisions for yourself. Generally, people ask a trusted friend or family member to act as their proxy and represent their preferences. Make sure you have an agreement with your trusted person to act as your proxy. A proxy may have to make a medical decision on your behalf if your wishes are not known. A medical power of attorney, also called a durable power of attorney for health care, is a legal document that names your health care proxy. Depending on the laws in your state, the document may need to be:  Signed.  Notarized.  Dated.  Copied.  Witnessed.  Incorporated into your medical record. You may also want to appoint a trusted person to manage your money in the event you are unable to do so. This is called a durable power of attorney for finances. It is a separate legal document from the durable power of attorney for health care. You may choose your health care proxy or someone different to act as your agent in money matters. If you do not appoint a proxy, or there is a concern that the proxy is not acting in your best interest, a court may appoint a guardian to act on your behalf. Living will A living will is a set of instructions that state your wishes about medical care when you cannot express them yourself. Health care providers should keep a copy of your living will in your medical record. You may want to give a copy to family members or friends. To alert caregivers in case of an emergency, you can place a card in your wallet to let them know that you have a living will and where they can find it. A living will is used if you become:  Terminally ill.  Disabled.  Unable to communicate or make decisions. The following decisions should be included in your  living will:  To use or not to use life support equipment, such as dialysis machines and breathing machines (ventilators).  Whether you want a DNR or DNAR order. This tells health care providers not to use cardiopulmonary resuscitation (CPR) if breathing or heartbeat stops.  To use or not to use tube feeding.  To be given or not to be given food and fluids.  Whether you want comfort (palliative) care when the goal becomes comfort rather than a cure.  Whether you want to donate your organs and tissues. A living will does not give instructions for distributing your money and property if you should pass away. DNR or DNAR A DNR or DNAR order is a request not to have CPR in the event that your heart stops beating or you stop breathing. If a DNR or DNAR order has not been made and shared, a health care provider will try to help any patient whose heart has stopped or who has stopped breathing. If you plan to have surgery, talk with your health care provider about how your DNR or DNAR order will be followed if problems occur. What if I do not have an advance directive? Some states assign family decision makers to act on your behalf if you do not have an advance directive. Each state has its own laws about advance directives. You may want to check with your health care provider, attorney, or state representative about the laws in your state. Summary  Advance directives are legal documents that allow you to make decisions about your health care and medical treatment in case you become unable to communicate for yourself.  The process of discussing and writing advance directives should happen over time. You can change and update advance directives at any time.  Advance directives may include a medical power of attorney, a living will, and a DNR or DNAR order. This information is not intended to replace advice given to you by your health care provider. Make sure you discuss any questions you have with  your health care provider. Document Revised: 06/30/2020 Document Reviewed: 06/30/2020 Elsevier Patient Education  2021 [place].  Consent to CCM Services: Mr. Mcneill was given information about Chronic Care Management services today including:  1. CCM service includes personalized support from designated clinical staff supervised by his physician, including individualized plan of care and coordination with other care providers 2. 24/7 contact phone numbers for assistance for urgent and routine care needs. 3. Service will only be billed when office clinical staff spend 20 minutes or more in a month to coordinate care. 4. Only one practitioner may furnish and bill the service in a calendar month. 5. The patient may stop CCM services at any time (effective at the end of the month) by phone call to the office staff. 6. The patient will be responsible for cost sharing (co-pay) of up to 20% of the service fee (after annual deductible is met).  Patient agreed to services and verbal consent obtained.    The patient/ spouse verbalized understanding of instructions, educational materials, and care plan provided today and agreed to receive a mailed copy of patient instructions, educational materials, and care plan  Telephone follow up appointment with patient/ spouse and care management team member scheduled for:  Tuesday April 13, 2021 at 10:00 am  The patient/ spouse has been provided with contact information for the care management team and has been advised to call with any health related questions or concerns.   Oneta Rack, RN, BSN, [facility] RN Care Coordination- [place] [phone]: direct office [phone]: mobile    CLINICAL CARE PLAN: Patient Care Plan: Hypertension (Adult)    Problem Identified: Hypertension (Hypertension)   Priority: Medium    Long-Range Goal: Hypertension  Monitored   Start Date: 03/02/2021  Expected End Date: 03/02/2022   This Visit's Progress: On track  Priority: Medium  Note:   Objective:  . Last practice recorded BP readings:  BP Readings from Last 3 Encounters:  02/18/21 124/62  11/09/20 118/70  09/10/20 127/76 .   Marland Kitchen Most recent eGFR/CrCl: No results found for: EGFR  No components found for: CRCL Current Barriers:  Marland Kitchen Knowledge Deficits related to basic understanding of hypertension pathophysiology and self care management- patient and spouse could benefit from ongoing reinforcement . Decreased motivation to increase activity, difficulty managing dietary portions Case Manager Clinical Goal(s):  Over the next year, patient will demonstrate ongoing adherence to prescribed treatment plan for hypertension as evidenced by patient/ spouse reporting during CCM RN CM outreach, of: . Taking all medications as prescribed . Monitoring and recording weekly blood pressure and weights at home as directed . Adhering to low sodium/DASH diet . Weight loss- reported weight 03/02/21: 311 lbs Interventions:  . Collaboration with Horald Pollen, MD regarding development and update of comprehensive plan of care as evidenced by provider attestation and co-signature . Inter-disciplinary care team collaboration (see longitudinal plan of care) . Chart reviewed including relevant office notes, upcoming scheduled appointments, and lab results . Evaluation of current treatment plan related to hypertension self management and patient's adherence to plan as established by provider . Assessed patient/ caregiver's current understanding of self-health management of HTN/ HLD- good general understanding: could benefit from ongoing reinforcement of disease management of HTN/ HLD . Discussed current  clinical condition with patient/ spouse and confirmed no current clinical or medication concerns; patient manages medications independently at home and reports adherence to medication regimen . Reviewed medications with patient's spouse  and discussed importance of compliance/ adherence; no discrepancies or concerns identified as a result of medication review . Advised patient/ spouse, providing education and rationale, to monitor/ record blood pressure and weights at home 1-2 times per week; initiated education around role of diuretic in setting of HTN/ CAD; initiated discussion of benefit of knowing weight gain guidelines in setting of CAD/ HTN/ HLD along with corresponding action plan for weight gain: spouse reports patient was placed in diuretic this winter for leg swelling/ oozing which has since resolved after being put on diuretic.   Marland Kitchen Provided education to patient re: stroke prevention, s/s of heart attack and stroke, DASH diet, complications of uncontrolled blood pressure . Discussed patient's desire to lose weight and provided strategies to begin gradually losing weight: increase activity, limit portion sizes . Confirmed patient continues to endorse following low salt, heart healthy diet . Provided education around Advanced Directive planning and mailed printed educational material/ planning packet to patient for review . Reviewed scheduled/upcoming provider appointments including: November 2022: next PCP appointment . Discussed plans with patient for ongoing care management follow up and provided patient with direct contact information for care management team Self-Care Activities: . Self administers medications as prescribed . Attends all scheduled provider appointments . Calls provider office for new concerns, questions, or BP outside discussed parameters . Checks BP and records as discussed . Follows a low sodium diet/DASH diet Patient Goals: . Learn about high blood pressure and high cholesterol . Agree on reward when goals are met around your desire to lose weight . Ask questions to understand . Check blood pressure weekly and write the results down on paper in a dedicated log, calendar or diary- we will review  these with each telephone appointment  . Monitor your  weights at home 1- 2 times per week and write the results down on paper in a dedicated log, calendar or diary- we will review these with each telephone appointment . Continue following a heart healthy, low sodium diet . Continue taking your medications as instructed . If possible,try to take steps to gradually increase your activity by walking short distances . Read over the enclosed educational material- we will talk about these with our next telephone appointment Follow Up Plan:  . Telephone follow up appointment with patient/ spouse and care management team member scheduled for: Tuesday April 13, 2021 at 10:00 am . The patient has been provided with contact information for the care management team and has been advised to call with any health related questions or concerns.

## 2021-03-02 NOTE — Chronic Care Management (AMB) (Signed)
Chronic Care Management   CCM RN Visit Note  03/02/2021 Name: [name] MRN: [id number] DOB: [birth year]  Subjective: [name] is a 75 y.o. year old male who is a primary care patient of Mitchel Honour, Ines Bloomer, MD. The care management team was consulted for assistance with disease management and care coordination needs.    Engaged with patient/ spouse [name] by telephone for initial visit in response to provider referral for case management and/or care coordination services.   Consent to Services:  The patient was given information about Chronic Care Management services, agreed to services, and gave verbal consent 02/19/21 prior to initiation of services.  Please see initial visit note for detailed documentation.  Patient agreed to services and verbal consent obtained.   Assessment: Review of patient past medical history, allergies, medications, health status, including review of consultants reports, laboratory and other test data, was performed as part of comprehensive evaluation and provision of chronic care management services.   SDOH (Social Determinants of Health) assessments and interventions performed:  SDOH Interventions   Flowsheet Row Most Recent Value  SDOH Interventions   Food Insecurity Interventions Intervention Not Indicated  Housing Interventions Intervention Not Indicated  Transportation Interventions Intervention Not Indicated      CCM Care Plan  No Known Allergies  Outpatient Encounter Medications as of 03/02/2021  Medication Sig Note  . acetaminophen (TYLENOL) 325 MG tablet Take 2 tablets (650 mg total) by mouth every 6 (six) hours as needed for mild pain (or Fever >/= 101).   Marland Kitchen ALPRAZolam (XANAX) 0.25 MG tablet Take 1 tablet (0.25 mg total) by mouth at bedtime as needed for anxiety.   Mariane Baumgarten Sodium (COLACE PO) Take by mouth daily.   . furosemide (LASIX) 40 MG tablet Take 1 tablet (40 mg total) by mouth daily. Take 1/2 tab for 5 days before taking whole  tab.   . lisinopril (ZESTRIL) 10 MG tablet Take 1 tablet (10 mg total) by mouth daily.   . pantoprazole (PROTONIX) 40 MG tablet Take 1 tablet (40 mg total) by mouth daily.   . rivaroxaban (XARELTO) 20 MG TABS tablet Take 1 tablet (20 mg total) by mouth daily with supper.   . rosuvastatin (CRESTOR) 20 MG tablet Take 1 tablet (20 mg total) by mouth daily.   . Sennosides (SENOKOT PO) Take by mouth daily. 10/28/2020: Senokot D   No facility-administered encounter medications on file as of 03/02/2021.    Patient Active Problem List   Diagnosis Date Noted  . History of pulmonary embolism 02/25/2020  . Current use of long term anticoagulation 02/25/2020  . 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 CAD in native artery 08/31/2016  . Essential hypertension 08/31/2016  . Hyperlipidemia LDL goal <70 08/31/2016  . Family history of heart disease in male family member before age 72 08/31/2016  . 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 Generalized anxiety disorder 08/31/2016  . History of colonic polyps 08/31/2016    Conditions to be addressed/monitored:HTN and HLD  Care Plan : Hypertension (Adult)  Updates made by Knox Royalty, RN since 03/02/2021 12:00 AM    Problem: Hypertension (Hypertension)   Priority: Medium    Long-Range Goal: Hypertension Monitored   Start Date: 03/02/2021  Expected End Date: 03/02/2022  This Visit's Progress: On track  Priority: Medium  Note:   Objective:  . Last practice recorded BP readings:  BP Readings from Last 3 Encounters:  02/18/21 124/62  11/09/20 118/70  09/10/20  127/76 .   Marland Kitchen Most recent eGFR/CrCl: No results found for: EGFR  No components found for: CRCL Current Barriers:  Marland Kitchen Knowledge Deficits related to basic understanding of hypertension pathophysiology and self care management- patient and spouse could benefit from ongoing reinforcement . Decreased motivation to increase activity,  difficulty managing dietary portions Case Manager Clinical Goal(s):  Over the next year, patient will demonstrate ongoing adherence to prescribed treatment plan for hypertension as evidenced by patient/ spouse reporting during CCM RN CM outreach, of: . Taking all medications as prescribed . Monitoring and recording weekly blood pressure and weights at home as directed . Adhering to low sodium/DASH diet . Weight loss- reported weight 03/02/21: 311 lbs Interventions:  . Collaboration with Horald Pollen, MD regarding development and update of comprehensive plan of care as evidenced by provider attestation and co-signature . Inter-disciplinary care team collaboration (see longitudinal plan of care) . Chart reviewed including relevant office notes, upcoming scheduled appointments, and lab results . Evaluation of current treatment plan related to hypertension self management and patient's adherence to plan as established by provider . Assessed patient/ caregiver's current understanding of self-health management of HTN/ HLD- good general understanding: could benefit from ongoing reinforcement of disease management of HTN/ HLD . Discussed current  clinical condition with patient/ spouse and confirmed no current clinical or medication concerns; patient manages medications independently at home and reports adherence to medication regimen . Reviewed medications with patient's spouse and discussed importance of compliance/ adherence; no discrepancies or concerns identified as a result of medication review . Advised patient/ spouse, providing education and rationale, to monitor/ record blood pressure and weights at home 1-2 times per week; initiated education around role of diuretic in setting of HTN/ CAD; initiated discussion of benefit of knowing weight gain guidelines in setting of CAD/ HTN/ HLD along with corresponding action plan for weight gain: spouse reports patient was placed in diuretic this  winter for leg swelling/ oozing which has since resolved after being put on diuretic.   Marland Kitchen Provided education to patient re: stroke prevention, s/s of heart attack and stroke, DASH diet, complications of uncontrolled blood pressure . Discussed patient's desire to lose weight and provided strategies to begin gradually losing weight: increase activity, limit portion sizes . Confirmed patient continues to endorse following low salt, heart healthy diet . Provided education around Advanced Directive planning and mailed printed educational material/ planning packet to patient for review . Reviewed scheduled/upcoming provider appointments including: November 2022: next PCP appointment . Discussed plans with patient for ongoing care management follow up and provided patient with direct contact information for care management team Self-Care Activities: . Self administers medications as prescribed . Attends all scheduled provider appointments . Calls provider office for new concerns, questions, or BP outside discussed parameters . Checks BP and records as discussed . Follows a low sodium diet/DASH diet Patient Goals: . Learn about high blood pressure and high cholesterol . Agree on reward when goals are met around your desire to lose weight . Ask questions to understand . Check blood pressure weekly and write the results down on paper in a dedicated log, calendar or diary- we will review these with each telephone appointment  . Monitor your weights at home 1- 2 times per week and write the results down on paper in a dedicated log, calendar or diary- we will review these with each telephone appointment . Continue following a heart healthy, low sodium diet . Continue taking your medications as instructed . If  possible,try to take steps to gradually increase your activity by walking short distances . Read over the enclosed educational material- we will talk about these with our next telephone  appointment Follow Up Plan:  . Telephone follow up appointment with patient/ spouse and care management team member scheduled for: Tuesday April 13, 2021 at 10:00 am . The patient has been provided with contact information for the care management team and has been advised to call with any health related questions or concerns.      Plan:  Telephone follow up appointment with care management team member scheduled for: Tuesday April 13, 2021 at 10:00 am   The patient has been provided with contact information for the care management team and has been advised to call with any health related questions or concerns   Oneta Rack, RN, BSN, [place] [phone]: direct office [phone]: mobile

## 2021-03-06 ENCOUNTER — Other Ambulatory Visit: Payer: Self-pay | Admitting: Family Medicine

## 2021-03-06 DIAGNOSIS — Z86711 Personal history of pulmonary embolism: Secondary | ICD-10-CM

## 2021-03-11 ENCOUNTER — Encounter: Payer: Self-pay | Admitting: Emergency Medicine

## 2021-04-01 DIAGNOSIS — Z23 Encounter for immunization: Secondary | ICD-10-CM | POA: Diagnosis not present

## 2021-04-13 ENCOUNTER — Ambulatory Visit (INDEPENDENT_AMBULATORY_CARE_PROVIDER_SITE_OTHER): Payer: Medicare Other | Admitting: *Deleted

## 2021-04-13 DIAGNOSIS — I10 Essential (primary) hypertension: Secondary | ICD-10-CM

## 2021-04-13 DIAGNOSIS — E785 Hyperlipidemia, unspecified: Secondary | ICD-10-CM

## 2021-04-13 NOTE — Patient Instructions (Signed)
Visit Information  [name] and [name], it was nice talking with you today.   Please read over the attached information, and keep up the great work tracking your blood pressures and weights at home!      I look forward to talking to you again for an update on Monday, July 12, 2021 at 10:00 am- please be listening out for my call that day.  I will call as close to 10:00 am as possible; I look forward to hearing about your progress.   Please don't hesitate to contact me if I can be of assistance to you before our next scheduled appointment.   Oneta Rack, RN, BSN, [facility] RN Care Coordination- [place] [phone]: direct office [phone]: mobile    PATIENT GOALS:  Goals Addressed             This Visit's Progress    Lifestyle Change-Hypertension   On track    Timeframe:  Long-Range Goal Priority:  Medium Start Date:       03/02/21                      Expected End Date:   03/02/22                    Follow Up Date 07/12/21    Great job tracking your blood pressure at home!  Today, you reported blood pressures ranges between 117-120/70-78, your blood pressure at home today was reported at 117/73 Learn about high blood pressure and high cholesterol Agree on reward when goals are met around your desire to lose weight- today, you reported recent weights at home between 300- 305 lbs-- this is a decrease from your last reported weight of 311 lbs on 03/02/21-- GREAT JOB!  Keep up the great work Read over the attached information and keep trying to make small changes to your diet for lasting effect Keep trying to increase your activity- this will help your blood pressure stay in range and will help you lose weight Ask questions to understand    Why is this important?   The changes that you are asked to make may be hard This is especially true when the changes are life-long.  Knowing why it is important to you is the first step.  Working on the  change with your family or support person helps you not feel alone.  Reward yourself and family or support person when goals are met. This can be an activity you choose like bowling, hiking, biking, swimming or shooting hoops.           Track and Manage My Blood Pressure-Hypertension   On track    Timeframe:  Long-Range Goal Priority:  Medium Start Date:          03/02/21                   Expected End Date:     03/02/22                  Follow Up Date 07/12/21   Continue checking blood pressure weekly and write the results down on paper in a dedicated log, calendar or diary- we will review these with each telephone appointment  Try to start writing down your weekly blood pressures at home- this is a good practice so you can keep up with your normal blood pressures in between doctor appointments Continue monitoring/ writing down your weights  at home 1- 2 times per week- we will review these with each telephone appointment; today you reported weights at home between 300-305 lbs Continue following a heart healthy, low sodium diet Continue taking your medications as instructed If possible,try to take steps to gradually increase your activity by walking short distances Read over the enclosed educational material- we will talk about these with our next telephone appointment   Why is this important?   You won't feel high blood pressure, but it can still hurt your blood vessels.  High blood pressure can cause heart or kidney problems. It can also cause a stroke.  Making lifestyle changes like losing a little weight or eating less salt will help.  Checking your blood pressure at home and at different times of the day can help to control blood pressure.  If the doctor prescribes medicine remember to take it the way the doctor ordered.  Call the office if you cannot afford the medicine or if there are questions about it.             Preventing Unhealthy Weight Gain, Adult Staying at a  healthy weight is important to your overall health. When fat builds up in your body, you may become overweight or obese. Being overweight or obese increases your risk of developing certain health problems, such as heartdisease, diabetes, sleeping problems, joint problems, and some types of cancer. Unhealthy weight gain is often the result of making unhealthy food choices or not getting enough exercise. You can make changes to your lifestyle to preventobesity and stay as healthy as possible. What nutrition changes can be made?  Eat only as much as your body needs. To do this: Pay attention to signs that you are hungry or full. Stop eating as soon as you feel full. If you feel hungry, try drinking water first before eating. Drink enough water so your urine is clear or pale yellow. Eat smaller portions. Pay attention to portion sizes when eating out. Look at serving sizes on food labels. Most foods contain more than one serving per container. Eat the recommended number of calories for your gender and activity level. For most active people, a daily total of 2,000 calories is appropriate. If you are trying to lose weight or are not very active, you may need to eat fewer calories. Talk with your health care provider or a diet and nutrition specialist (dietitian) about how many calories you need each day. Choose healthy foods, such as: Fruits and vegetables. At each meal, try to fill at least half of your plate with fruits and vegetables. Whole grains, such as whole-wheat bread, brown rice, and quinoa. Lean meats, such as chicken or fish. Other healthy proteins, such as beans, eggs, or tofu. Healthy fats, such as nuts, seeds, fatty fish, and olive oil. Low-fat or fat-free dairy products. Check food labels, and avoid food and drinks that: Are high in calories. Have added sugar. Are high in sodium. Have saturated fats or trans fats. Cook foods in healthier ways, such as by baking, broiling, or  grilling. Make a meal plan for the week, and shop with a grocery list to help you stay on track with your purchases. Try to avoid going to the grocery store when you are hungry. When grocery shopping, try to shop around the outside of the store first, where the fresh foods are. Doing this helps you to avoid prepackaged foods, which can be high in sugar, salt (sodium), and fat. What lifestyle changes can be  made?  Exercise for 30 or more minutes on 5 or more days each week. Exercising may include brisk walking, yard work, biking, running, swimming, and team sports like basketball and soccer. Ask your health care provider which exercises are safe for you. Do muscle-strengthening activities, such as lifting weights or using resistance bands, on 2 or more days a week. Do not use any products that contain nicotine or tobacco, such as cigarettes and e-cigarettes. If you need help quitting, ask your health care provider. Limit alcohol intake to no more than 1 drink a day for nonpregnant women and 2 drinks a day for men. One drink equals 12 oz of beer, 5 oz of wine, or 1 oz of hard liquor. Try to get 7-9 hours of sleep each night. What other changes can be made? Keep a food and activity journal to keep track of: What you ate and how many calories you had. Remember to count the calories in sauces, dressings, and side dishes. Whether you were active, and what exercises you did. Your calorie, weight, and activity goals. Check your weight regularly. Track any changes. If you notice you have gained weight, make changes to your diet or activity routine. Avoid taking weight-loss medicines or supplements. Talk to your health care provider before starting any new medicine or supplement. Talk to your health care provider before trying any new diet or exercise plan. Why are these changes important? Eating healthy, staying active, and having healthy habits can help you to prevent obesity. Those changes also: Help  you manage stress and emotions. Help you connect with friends and family. Improve your self-esteem. Improve your sleep. Prevent long-term health problems. What can happen if changes are not made? Being obese or overweight can cause you to develop joint or bone problems, which can make it hard for you to stay active or do activities you enjoy. Being obese or overweight also puts stress on your heart and lungs and can lead tohealth problems like diabetes, heart disease, and some cancers. Where to find more information Talk with your health care provider or a dietitian about healthy eating and healthy lifestyle choices. You may also find information from: U.S. Department of Agriculture, MyPlate: FormerBoss.no American Heart Association: www.heart.org Centers for Disease Control and Prevention: http://www.wolf.info/ Summary Staying at a healthy weight is important to your overall health. It helps you to prevent certain diseases and health problems, such as heart disease, diabetes, joint problems, sleep disorders, and some types of cancer. Being obese or overweight can cause you to develop joint or bone problems, which can make it hard for you to stay active or do activities you enjoy. You can prevent unhealthy weight gain by eating a healthy diet, exercising regularly, not smoking, limiting alcohol, and getting enough sleep. Talk with your health care provider or a dietitian for guidance about healthy eating and healthy lifestyle choices. This information is not intended to replace advice given to you by your health care provider. Make sure you discuss any questions you have with your healthcare provider. Document Revised: 01/23/2020 Document Reviewed: 01/23/2020 Elsevier Patient Education  [place].  [email or web address]">  DASH Eating Plan DASH stands for Dietary Approaches to Stop Hypertension. The DASH eating plan is a healthy eating plan  that has been shown to: Reduce high blood pressure (hypertension). Reduce your risk for type 2 diabetes, heart disease, and stroke. Help with weight loss. What are tips for following this plan? Reading food labels Check food labels for the amount of salt (  sodium) per serving. Choose foods with less than 5 percent of the Daily Value of sodium. Generally, foods with less than 300 milligrams (mg) of sodium per serving fit into this eating plan. To find whole grains, look for the word "whole" as the first word in the ingredient list. Shopping Buy products labeled as "low-sodium" or "no salt added." Buy fresh foods. Avoid canned foods and pre-made or frozen meals. Cooking Avoid adding salt when cooking. Use salt-free seasonings or herbs instead of table salt or sea salt. Check with your health care provider or pharmacist before using salt substitutes. Do not fry foods. Cook foods using healthy methods such as baking, boiling, grilling, roasting, and broiling instead. Cook with heart-healthy oils, such as olive, canola, avocado, soybean, or sunflower oil. Meal planning  Eat a balanced diet that includes: 4 or more servings of fruits and 4 or more servings of vegetables each day. Try to fill one-half of your plate with fruits and vegetables. 6-8 servings of whole grains each day. Less than 6 oz (170 g) of lean meat, poultry, or fish each day. A 3-oz (85-g) serving of meat is about the same size as a deck of cards. One egg equals 1 oz (28 g). 2-3 servings of low-fat dairy each day. One serving is 1 cup (237 mL). 1 serving of nuts, seeds, or beans 5 times each week. 2-3 servings of heart-healthy fats. Healthy fats called omega-3 fatty acids are found in foods such as walnuts, flaxseeds, fortified milks, and eggs. These fats are also found in cold-water fish, such as sardines, salmon, and mackerel. Limit how much you eat of: Canned or prepackaged foods. Food that is high in trans fat, such as some  fried foods. Food that is high in saturated fat, such as fatty meat. Desserts and other sweets, sugary drinks, and other foods with added sugar. Full-fat dairy products. Do not salt foods before eating. Do not eat more than 4 egg yolks a week. Try to eat at least 2 vegetarian meals a week. Eat more home-cooked food and less restaurant, buffet, and fast food.  Lifestyle When eating at a restaurant, ask that your food be prepared with less salt or no salt, if possible. If you drink alcohol: Limit how much you use to: 0-1 drink a day for women who are not pregnant. 0-2 drinks a day for men. Be aware of how much alcohol is in your drink. In the [place] one drink equals one 12 oz bottle of beer (355 mL), one 5 oz glass of wine (148 mL), or one 1 oz glass of hard liquor (44 mL). General information Avoid eating more than 2,300 mg of salt a day. If you have hypertension, you may need to reduce your sodium intake to 1,500 mg a day. Work with your health care provider to maintain a healthy body weight or to lose weight. Ask what an ideal weight is for you. Get at least 30 minutes of exercise that causes your heart to beat faster (aerobic exercise) most days of the week. Activities may include walking, swimming, or biking. Work with your health care provider or dietitian to adjust your eating plan to your individual calorie needs. What foods should I eat? Fruits All fresh, dried, or frozen fruit. Canned fruit in natural juice (without addedsugar). Vegetables Fresh or frozen vegetables (raw, steamed, roasted, or grilled). Low-sodium or reduced-sodium tomato and vegetable juice. Low-sodium or reduced-sodium tomatosauce and tomato paste. Low-sodium or reduced-sodium canned vegetables. Grains Whole-grain or whole-wheat bread.  Whole-grain or whole-wheat pasta. Brown rice. [place]. Bulgur. Whole-grain and low-sodium cereals. Pita bread.Low-fat, low-sodium crackers. Whole-wheat flour  tortillas. Meats and other proteins Skinless chicken or [place]. Ground chicken or [place]. Pork with fat trimmed off. Fish and seafood. Egg whites. Dried beans, peas, or lentils. Unsalted nuts, nut butters, and seeds. Unsalted canned beans. Lean cuts of beef with fat trimmed off. Low-sodium, lean precooked or cured meat, such as sausages or meatloaves. Dairy Low-fat (1%) or fat-free (skim) milk. Reduced-fat, low-fat, or fat-free cheeses. Nonfat, low-sodium ricotta or cottage cheese. Low-fat or nonfatyogurt. Low-fat, low-sodium cheese. Fats and oils Soft margarine without trans fats. Vegetable oil. Reduced-fat, low-fat, or light mayonnaise and salad dressings (reduced-sodium). Canola, safflower, olive, avocado, soybean, andsunflower oils. Avocado. Seasonings and condiments Herbs. Spices. Seasoning mixes without salt. Other foods Unsalted popcorn and pretzels. Fat-free sweets. The items listed above may not be a complete list of foods and beverages you can eat. Contact a dietitian for more information. What foods should I avoid? Fruits Canned fruit in a light or heavy syrup. Fried fruit. Fruit in cream or buttersauce. Vegetables Creamed or fried vegetables. Vegetables in a cheese sauce. Regular canned vegetables (not low-sodium or reduced-sodium). Regular canned tomato sauce and paste (not low-sodium or reduced-sodium). Regular tomato and vegetable juice(not low-sodium or reduced-sodium). Angie Fava. Olives. Grains Baked goods made with fat, such as croissants, muffins, or some breads. Drypasta or rice meal packs. Meats and other proteins Fatty cuts of meat. Ribs. Fried meat. Berniece Salines. Bologna, salami, and other precooked or cured meats, such as sausages or meat loaves. Fat from the back of a pig (fatback). Bratwurst. Salted nuts and seeds. Canned beans with added salt. Canned orsmoked fish. Whole eggs or egg yolks. Chicken or [place] with skin. Dairy Whole or 2% milk, cream, and half-and-half. Whole or  full-fat cream cheese. Whole-fat or sweetened yogurt. Full-fat cheese. Nondairy creamers. Whippedtoppings. Processed cheese and cheese spreads. Fats and oils Butter. Stick margarine. Lard. Shortening. Ghee. Bacon fat. Tropical oils, suchas coconut, palm kernel, or palm oil. Seasonings and condiments Onion salt, garlic salt, seasoned salt, table salt, and sea salt. Worcestershire sauce. Tartar sauce. Barbecue sauce. Teriyaki sauce. Soy sauce, including reduced-sodium. Steak sauce. Canned and packaged gravies. Fish sauce. Oyster sauce. Cocktail sauce. Store-bought horseradish. Ketchup. Mustard. Meat flavorings and tenderizers. Bouillon cubes. Hot sauces. Pre-made or packaged marinades. Pre-made or packaged taco seasonings. Relishes. Regular saladdressings. Other foods Salted popcorn and pretzels. The items listed above may not be a complete list of foods and beverages you should avoid. Contact a dietitian for more information. Where to find more information National Heart, Lung, and Blood Institute: https://wilson-eaton.com/ American Heart Association: www.heart.org Academy of Nutrition and Dietetics: www.eatright.[place]: www.kidney.org Summary The DASH eating plan is a healthy eating plan that has been shown to reduce high blood pressure (hypertension). It may also reduce your risk for type 2 diabetes, heart disease, and stroke. When on the DASH eating plan, aim to eat more fresh fruits and vegetables, whole grains, lean proteins, low-fat dairy, and heart-healthy fats. With the DASH eating plan, you should limit salt (sodium) intake to 2,300 mg a day. If you have hypertension, you may need to reduce your sodium intake to 1,500 mg a day. Work with your health care provider or dietitian to adjust your eating plan to your individual calorie needs. This information is not intended to replace advice given to you by your health care provider. Make sure you discuss any questions you have  with your  healthcare provider. Document Revised: 08/30/2019 Document Reviewed: 08/30/2019 Elsevier Patient Education  2022 [employer].   Patient verbalizes understanding of instructions provided today and agrees to view in [place].  Telephone follow up appointment with care management team member scheduled for:  Monday, July 12, 2021 at 10:00 am The patient has been provided with contact information for the care management team and has been advised to call with any health related questions or concerns  Oneta Rack, RN, BSN, [place] [phone]: direct office [phone]: mobile

## 2021-04-13 NOTE — Chronic Care Management (AMB) (Signed)
Chronic Care Management   CCM RN Visit Note  04/13/2021 Name: [name] MRN: [id number] DOB: 1946/02/12  Subjective: [name] is a 75 y.o. year old male who is a primary care patient of Mitchel Honour, Ines Bloomer, MD. The care management team was consulted for assistance with disease management and care coordination needs.    Engaged with patient by telephone for follow up visit in response to provider referral for case management and/or care coordination services.   Consent to Services:  The patient was given information about Chronic Care Management services, agreed to services, and gave verbal consent prior to initiation of services.  Please see initial visit note for detailed documentation.  Patient agreed to services and verbal consent obtained.   Assessment: Review of patient past medical history, allergies, medications, health status, including review of consultants reports, laboratory and other test data, was performed as part of comprehensive evaluation and provision of chronic care management services.    CCM Care Plan  No Known Allergies  Outpatient Encounter Medications as of 04/13/2021  Medication Sig Note   acetaminophen (TYLENOL) 325 MG tablet Take 2 tablets (650 mg total) by mouth every 6 (six) hours as needed for mild pain (or Fever >/= 101).    ALPRAZolam (XANAX) 0.25 MG tablet Take 1 tablet (0.25 mg total) by mouth at bedtime as needed for anxiety.    Docusate Sodium (COLACE PO) Take by mouth daily.    furosemide (LASIX) 40 MG tablet Take 1 tablet (40 mg total) by mouth daily. Take 1/2 tab for 5 days before taking whole tab.    lisinopril (ZESTRIL) 10 MG tablet Take 1 tablet (10 mg total) by mouth daily.    pantoprazole (PROTONIX) 40 MG tablet Take 1 tablet (40 mg total) by mouth daily.    rosuvastatin (CRESTOR) 20 MG tablet Take 1 tablet (20 mg total) by mouth daily.    Sennosides (SENOKOT PO) Take by mouth daily. 10/28/2020: Senokot D   XARELTO 20 MG TABS tablet  TAKE 1 TABLET BY MOUTH DAILY WITH SUPPER.    No facility-administered encounter medications on file as of 04/13/2021.    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   Generalized anxiety disorder 08/31/2016   History of colonic polyps 08/31/2016    Conditions to be addressed/monitored: HTN and HLD  Care Plan : Hypertension (Adult)  Updates made by Knox Royalty, RN since 04/13/2021 12:00 AM     Problem: Hypertension (Hypertension)   Priority: Medium     Long-Range Goal: Hypertension Monitored   Start Date: 03/02/2021  Expected End Date: 03/02/2022  This Visit's Progress: On track  Recent Progress: On track  Priority: Medium  Note:   Objective:  Last practice recorded BP readings:  BP Readings from Last 3 Encounters:  02/18/21 124/62  11/09/20 118/70  09/10/20 127/76   Most recent eGFR/CrCl: No results found for: EGFR  No components found for: CRCL Current Barriers:  Knowledge Deficits related to basic understanding of hypertension pathophysiology and self care management- patient and spouse could benefit from ongoing reinforcement Decreased motivation to increase  activity, difficulty managing dietary portions Case Manager Clinical Goal(s):  03/02/21: Over the next 12 months, patient will demonstrate ongoing adherence to prescribed treatment plan for hypertension as evidenced by patient/ spouse reporting during CCM RN CM outreach, of: Taking all medications as prescribed Monitoring and recording weekly blood pressure and weights at home as  directed Adhering to low sodium/DASH diet Weight loss- reported weight 03/02/21: 311 lbs; reported weight ranges today 04/13/21: 300-305 lbs Interventions:  Collaboration with Horald Pollen, MD regarding development and update of comprehensive plan of care as evidenced by provider attestation and co-signature Inter-disciplinary care team collaboration (see longitudinal plan of care) Chart reviewed including relevant office notes, upcoming scheduled appointments, and lab results Evaluation of current treatment plan related to hypertension self management and patient's adherence to plan as established by provider Discussed current clinical condition with patient/ spouse: deny current clinical concerns; confirm no recent changes to medications, no medication concerns Follow up call completed: confirmed patient has been monitoring/ recording weekly blood pressures and weights at home Reviewed blood pressures at home: reports consistent ranges between 117-120/73-78 Reviewed recent weights at home: reports consistent ranges between 300-305 lbs: this represents a decrease in patient's weight from 311 lbs on 03/02/21: positive reinforcement provided Confirmed patient has been trying to manage portion sizes; will send weight loss education to patient for review/ future discussion Confirmed patient continues to endorse following low salt, heart healthy diet Confirmed patient received previously provided education around Advanced Directive planning- deny questions today; still have not completed; still considering options Reviewed scheduled/upcoming provider appointments including: November 2022: next PCP appointment Discussed plans with patient for ongoing care management follow up and provided patient with direct contact information for care management team Self-Care Activities: Self administers medications as prescribed Attends all scheduled provider appointments Calls provider office for new  concerns, questions, or BP outside discussed parameters Checks BP and records as discussed Follows a low sodium diet/DASH diet Patient Goals: Dor[employer] job tracking your blood pressure at home!  Today, you reported blood pressures ranges between 117-120/70-78, your blood pressure at home today was reported at 117/73 Learn about high blood pressure and high cholesterol Agree on reward when goals are met around your desire to lose weight- today, you reported recent weights at home between 300- 305 lbs-- this is a decrease from your last reported weight of 311 lbs on 03/02/21-- GREAT JOB!  Keep up the great work Read over the attached information and keep trying to make small changes to your diet for lasting effect Keep trying to increase your activity- this will help your blood pressure stay in range and will help you lose weight Ask questions to understand Continue checking blood pressure weekly and write the results down on paper in a dedicated log, calendar or diary- we will review these with each telephone appointment  Try to start writing down your weekly blood pressures at home- this is a good practice so you can keep up with your normal blood pressures in between doctor appointments Continue monitoring/ writing down your weights at home 1- 2 times per week- we will review these with each telephone appointment; today you reported weights at home between 300-305 lbs Continue following a heart healthy, low sodium diet Continue taking your medications as instructed If possible,try to take steps to gradually increase your activity by walking short distances Read over the enclosed educational material- we will talk about these with our next telephone appointment Follow Up Plan:  Telephone follow up appointment with patient/  spouse and care management team member scheduled for: Monday July 12, 2021 at 10:00 am The patient has been provided with contact information for the care management team and has  been advised to call with any health related questions or concerns.      Plan: Telephone follow up appointment with care management team member scheduled for:  Monday, July 12, 2021 at 10:00 am The patient/ caregiver-spouse has been provided with contact information for the care management team and has been advised to call with any health related questions or concerns  Oneta Rack, RN, BSN, [place] [phone]: direct office [phone]: mobile

## 2021-04-18 ENCOUNTER — Encounter: Payer: Self-pay | Admitting: Emergency Medicine

## 2021-04-22 ENCOUNTER — Encounter: Payer: Self-pay | Admitting: Emergency Medicine

## 2021-04-23 ENCOUNTER — Other Ambulatory Visit: Payer: Self-pay

## 2021-04-23 DIAGNOSIS — R609 Edema, unspecified: Secondary | ICD-10-CM

## 2021-04-23 MED ORDER — LISINOPRIL 10 MG PO TABS: 10.0000 mg | ORAL_TABLET | Freq: Every day | ORAL | 1 refills | Status: DC

## 2021-04-23 MED ORDER — PANTOPRAZOLE SODIUM 40 MG PO TBEC: 40.0000 mg | DELAYED_RELEASE_TABLET | Freq: Every day | ORAL | 1 refills | Status: DC

## 2021-04-23 MED ORDER — FUROSEMIDE 40 MG PO TABS: 40.0000 mg | ORAL_TABLET | Freq: Every day | ORAL | 1 refills | Status: DC

## 2021-04-23 NOTE — Telephone Encounter (Signed)
Medication refills were sent to [facility] pharmacy.

## 2021-04-27 MED ORDER — ROSUVASTATIN CALCIUM 20 MG PO TABS: 20.0000 mg | ORAL_TABLET | Freq: Every day | ORAL | 3 refills | Status: DC

## 2021-06-25 ENCOUNTER — Telehealth: Payer: Self-pay | Admitting: Emergency Medicine

## 2021-06-25 NOTE — Telephone Encounter (Signed)
LVM for pt to rtn my call to schedule AWV with [facility]. Please schedule AWV if pt calls the office  

## 2021-07-03 ENCOUNTER — Ambulatory Visit (INDEPENDENT_AMBULATORY_CARE_PROVIDER_SITE_OTHER): Payer: Medicare Other | Admitting: *Deleted

## 2021-07-03 DIAGNOSIS — Z0000 Encounter for general adult medical examination without abnormal findings: Secondary | ICD-10-CM | POA: Diagnosis not present

## 2021-07-03 NOTE — Patient Instructions (Signed)
Health Maintenance, Male Adopting a healthy lifestyle and getting preventive care are important in promoting health and wellness. Ask your health care provider about: The right schedule for you to have regular tests and exams. Things you can do on your own to prevent diseases and keep yourself healthy. What should I know about diet, weight, and exercise? Eat a healthy diet  Eat a diet that includes plenty of vegetables, fruits, low-fat dairy products, and lean protein. Do not eat a lot of foods that are high in solid fats, added sugars, or sodium. Maintain a healthy weight Body mass index (BMI) is a measurement that can be used to identify possible weight problems. It estimates body fat based on height and weight. Your health care provider can help determine your BMI and help you achieve or maintain a healthy weight. Get regular exercise Get regular exercise. This is one of the most important things you can do for your health. Most adults should: Exercise for at least 150 minutes each week. The exercise should increase your heart rate and make you sweat (moderate-intensity exercise). Do strengthening exercises at least twice a week. This is in addition to the moderate-intensity exercise. Spend less time sitting. Even light physical activity can be beneficial. Watch cholesterol and blood lipids Have your blood tested for lipids and cholesterol at 73 years of age, then have this test every 5 years. You may need to have your cholesterol levels checked more often if: Your lipid or cholesterol levels are high. You are older than 73 years of age. You are at high risk for heart disease. What should I know about cancer screening? Many types of cancers can be detected early and may often be prevented. Depending on your health history and family history, you may need to have cancer screening at various ages. This may include screening for: Colorectal cancer. Prostate cancer. Skin cancer. Lung  cancer. What should I know about heart disease, diabetes, and high blood pressure? Blood pressure and heart disease High blood pressure causes heart disease and increases the risk of stroke. This is more likely to develop in people who have high blood pressure readings, are of African descent, or are overweight. Talk with your health care provider about your target blood pressure readings. Have your blood pressure checked: Every 3-5 years if you are 18-39 years of age. Every year if you are 40 years old or older. If you are between the ages of 65 and 75 and are a current or former smoker, ask your health care provider if you should have a one-time screening for abdominal aortic aneurysm (AAA). Diabetes Have regular diabetes screenings. This checks your fasting blood sugar level. Have the screening done: Once every three years after age 45 if you are at a normal weight and have a low risk for diabetes. More often and at a younger age if you are overweight or have a high risk for diabetes. What should I know about preventing infection? Hepatitis B If you have a higher risk for hepatitis B, you should be screened for this virus. Talk with your health care provider to find out if you are at risk for hepatitis B infection. Hepatitis C Blood testing is recommended for: Everyone born from 1945 through 1965. Anyone with known risk factors for hepatitis C. Sexually transmitted infections (STIs) You should be screened each year for STIs, including gonorrhea and chlamydia, if: You are sexually active and are younger than 73 years of age. You are older than 73 years   of age and your health care provider tells you that you are at risk for this type of infection. Your sexual activity has changed since you were last screened, and you are at increased risk for chlamydia or gonorrhea. Ask your health care provider if you are at risk. Ask your health care provider about whether you are at high risk for HIV.  Your health care provider may recommend a prescription medicine to help prevent HIV infection. If you choose to take medicine to prevent HIV, you should first get tested for HIV. You should then be tested every 3 months for as long as you are taking the medicine. Follow these instructions at home: Lifestyle Do not use any products that contain nicotine or tobacco, such as cigarettes, e-cigarettes, and chewing tobacco. If you need help quitting, ask your health care provider. Do not use street drugs. Do not share needles. Ask your health care provider for help if you need support or information about quitting drugs. Alcohol use Do not drink alcohol if your health care provider tells you not to drink. If you drink alcohol: Limit how much you have to 0-2 drinks a day. Be aware of how much alcohol is in your drink. In the [place] one drink equals one 12 oz bottle of beer (355 mL), one 5 oz glass of wine (148 mL), or one 1 oz glass of hard liquor (44 mL). General instructions Schedule regular health, dental, and eye exams. Stay current with your vaccines. Tell your health care provider if: You often feel depressed. You have ever been abused or do not feel safe at home. Summary Adopting a healthy lifestyle and getting preventive care are important in promoting health and wellness. Follow your health care provider's instructions about healthy diet, exercising, and getting tested or screened for diseases. Follow your health care provider's instructions on monitoring your cholesterol and blood pressure. This information is not intended to replace advice given to you by your health care provider. Make sure you discuss any questions you have with your health care provider. Document Revised: 12/04/2020 Document Reviewed: 09/19/2018 [employer]vier Patient Education  2022 [employer]  
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
## 2021-07-12 ENCOUNTER — Ambulatory Visit (INDEPENDENT_AMBULATORY_CARE_PROVIDER_SITE_OTHER): Payer: Medicare Other | Admitting: *Deleted

## 2021-07-12 DIAGNOSIS — I10 Essential (primary) hypertension: Secondary | ICD-10-CM

## 2021-07-12 DIAGNOSIS — E785 Hyperlipidemia, unspecified: Secondary | ICD-10-CM

## 2021-07-12 NOTE — Chronic Care Management (AMB) (Signed)
Chronic Care Management   CCM RN Visit Note  07/12/2021 Name: [name] MRN: [id number] DOB: 10/26/1945  Subjective: [name] is a 75 y.o. year old male who is a primary care patient of Sagardia, Miguel Jose, MD. The care management team was consulted for assistance with disease management and care coordination needs.    Engaged with patient by telephone for follow up visit in response to provider referral for case management and/or care coordination services.   Consent to Services:  The patient was given information about Chronic Care Management services, agreed to services, and gave verbal consent prior to initiation of services.  Please see initial visit note for detailed documentation.  Patient agreed to services and verbal consent obtained.   Assessment: Review of patient past medical history, allergies, medications, health status, including review of consultants reports, laboratory and other test data, was performed as part of comprehensive evaluation and provision of chronic care management services.   CCM Care Plan No Known Allergies  Outpatient Encounter Medications as of 07/12/2021  Medication Sig Note   acetaminophen (TYLENOL) 325 MG tablet Take 2 tablets (650 mg total) by mouth every 6 (six) hours as needed for mild pain (or Fever >/= 101).    ALPRAZolam (XANAX) 0.25 MG tablet Take 1 tablet (0.25 mg total) by mouth at bedtime as needed for anxiety.    Docusate Sodium (COLACE PO) Take by mouth daily.    furosemide (LASIX) 40 MG tablet Take 1 tablet (40 mg total) by mouth daily. Take 1/2 tab for 5 days before taking whole tab.    lisinopril (ZESTRIL) 10 MG tablet Take 1 tablet (10 mg total) by mouth daily.    pantoprazole (PROTONIX) 40 MG tablet Take 1 tablet (40 mg total) by mouth daily.    rosuvastatin (CRESTOR) 20 MG tablet Take 1 tablet (20 mg total) by mouth daily.    Sennosides (SENOKOT PO) Take by mouth daily. 10/28/2020: Senokot D   XARELTO 20 MG TABS tablet  TAKE 1 TABLET BY MOUTH DAILY WITH SUPPER.    No facility-administered encounter medications on file as of 07/12/2021.   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   CAD in native artery 08/31/2016   Essential hypertension 08/31/2016   Hyperlipidemia LDL goal <70 08/31/2016   Family history of heart disease in male family member before age 65 08/31/2016   Hearing loss, bilateral 08/31/2016   Generalized anxiety disorder 08/31/2016   History of colonic polyps 08/31/2016   Conditions to be addressed/monitored:  HTN and HLD  Care Plan : Hypertension (Adult)  Updates made by Tousey, Laine M, RN since 07/12/2021 12:00 AM     Problem: Hypertension (Hypertension)   Priority: Medium     Long-Range Goal: Hypertension Monitored   Start Date: 03/02/2021  Expected End Date: 03/02/2022  This Visit's Progress: On track  Recent Progress: On track  Priority: Medium  Note:   Objective:  Last practice recorded BP readings:  BP Readings from Last 3 Encounters:  02/18/21 124/62  11/09/20 118/70  09/10/20 127/76   Most recent eGFR/CrCl: No results found for: EGFR  No components found for: CRCL Current Barriers:  Knowledge Deficits related to basic understanding of hypertension pathophysiology and self care management- patient and spouse could benefit from ongoing reinforcement Decreased motivation to increase activity, difficulty managing   dietary portions Case Manager Clinical Goal(s):  03/02/21: Over the next 12 months, patient will demonstrate ongoing adherence to prescribed treatment plan for hypertension as evidenced by patient/ spouse reporting during CCM RN CM outreach, of: Taking all medications as prescribed Monitoring and recording weekly blood pressure and weights at home as  directed Adhering to low sodium/DASH diet Weight loss- reported weight 03/02/21: 311 lbs; reported weight ranges 04/13/21: 300-305 ; reports increase in weight 07/12/21-- 310 lbs- wife has been recuperating from foot suregry- so patient has assumed cooking responsibilities temporarily; continues to focus on attempts to lose weight: strategies reiterated today Interventions:  Collaboration with Sagardia, Miguel Jose, MD regarding development and update of comprehensive plan of care as evidenced by provider attestation and co-signature Inter-disciplinary care team collaboration (see longitudinal plan of care) Chart reviewed including relevant office notes, upcoming scheduled appointments, and lab results Evaluation of current treatment plan related to hypertension self management and patient's adherence to plan as established by provider Discussed current clinical condition with patient/ spouse: deny current clinical concerns; confirm no recent changes to medications, no medication concerns; confirm that prescriptions are now under control and is able to verbalize appropriate plan to obtain refills Follow up call completed: confirmed patient has been monitoring/ recording weekly blood pressures and weights at home Reviewed blood pressures at home: reports consistent ranges between 117-130/70-80 Reviewed recent weights at home: reports consistently at 310 lbs: this represents a slight increase in patient's previously reported weight, as above  Confirmed patient continues to endorse following low salt, heart healthy diet: spouse reports she will resume cooking responsibilities at home soon, as she continues to recuperate from her recent foot surgery Reviewed scheduled/upcoming provider appointments including: August 18, 2021: next PCP appointment- verbalizes plans to attend as scheduled Reviewed with patient and  spouse signs/ symptoms MI/ CVA, along with corresponding action plan: they have a very good  baseline understanding of same Discussed plans with patient for ongoing care management follow up and provided patient with direct contact information for care management team Self-Care Activities: Self administers medications as prescribed Attends all scheduled provider appointments Calls provider office for new concerns, questions, or BP outside discussed parameters Checks BP and records as discussed Follows a low sodium diet/DASH diet Patient Goals: Great job tracking your blood pressure at home!  Today, you reported blood pressures ranges between 117-120/70-78, your blood pressure at home today was reported at 117/77 Learn about high blood pressure and high cholesterol Agree on reward when goals are met around your desire to lose weight- today, you reported recent weights at home of 310 lbs-- Keep up the great work trying to stay active and eat right right to lose weight Make small changes to your diet for lasting effect Keep trying to increase your activity- this will help your blood pressure stay in range and will help you lose weight Continue checking blood pressure weekly and write the results down on paper in a dedicated log, calendar or diary- we will review these with each telephone appointment  Continue writing down your weekly blood pressures at home- this is a good practice so you can keep up with your normal blood pressures in between doctor appointments Continue monitoring/ writing down your weights at home 1- 2 times per week- we will review these with each telephone appointment; today you reported weights at home at 310 lbs Continue following a heart healthy, low sodium diet Continue taking your medications as instructed If possible,try to take steps to gradually increase your   activity by walking short distances Make sure you get your flu shot soon for the upcoming flu season  Follow Up Plan:  Telephone follow up appointment with patient/ spouse and care management team member  scheduled for: Friday, October 08, 2021 at 10:00 am The patient has been provided with contact information for the care management team and has been advised to call with any health related questions or concerns.      Plan: Telephone follow up appointment with care management team member scheduled for: Friday, October 08, 2021 at 10:00 am The patient has been provided with contact information for the care management team and has been advised to call with any health related questions or concerns  Oneta Rack, RN, BSN, [place] [phone]: direct office [phone]: mobile

## 2021-07-12 NOTE — Patient Instructions (Signed)
Visit Information  [name] and [name], it was nice talking with you today about [name]'s health care needs.   Keep up the great work trying to stay active, eat right, and lose weight- don't forget to get your flu vaccine soon for the 2022-23 flu season   I look forward to talking to you again for an update on Friday, October 08, 2021 at 10:00 am- please be listening out for my call that day.  I will call as close to 10:00 am as possible.   If you need to cancel or re-schedule our telephone visit, please call [phone] and one of our care guides will be happy to assist you.   I look forward to hearing about your progress.   Please don't hesitate to contact me if I can be of assistance to you before our next scheduled telephone appointment.   Oneta Rack, RN, BSN, [facility] RN Care Coordination- [place] [phone]: direct office [phone]: mobile   PATIENT GOALS:  Goals Addressed             This Visit's Progress    Lifestyle Change-Hypertension   On track    Timeframe:  Long-Range Goal Priority:  Medium Start Date:       03/02/21                      Expected End Date:   03/02/22                    Follow Up Date 10/08/21    Great job tracking your blood pressure at home!  Today, you reported blood pressures ranges between 117-120/70-78, your blood pressure at home today was reported at 117/77 Learn about high blood pressure and high cholesterol Agree on reward when goals are met around your desire to lose weight- today, you reported recent weights at home of 310 lbs-- Keep up the great work trying to stay active and eat right right to lose weight Make small changes to your diet for lasting effect Keep trying to increase your activity- this will help your blood pressure stay in range and will help you lose weight   Why is this important?   The changes that you are asked to make may be hard This is especially true when the changes  are life-long.  Knowing why it is important to you is the first step.  Working on the change with your family or support person helps you not feel alone.  Reward yourself and family or support person when goals are met. This can be an activity you choose like bowling, hiking, biking, swimming or shooting hoops.          Track and Manage My Blood Pressure-Hypertension   On track    Timeframe:  Long-Range Goal Priority:  Medium Start Date:          03/02/21                   Expected End Date:     03/02/22                  Follow Up Date 10/08/21   Continue checking blood pressure weekly and write the results down on paper in a dedicated log, calendar or diary- we will review these with each telephone appointment  Continue writing down your weekly blood pressures at home- this is a good practice so you can keep up with  your normal blood pressures in between doctor appointments Continue monitoring/ writing down your weights at home 1- 2 times per week- we will review these with each telephone appointment; today you reported weights at home at 310 lbs Continue following a heart healthy, low sodium diet Continue taking your medications as instructed If possible,try to take steps to gradually increase your activity by walking short distances Make sure you get your flu shot soon for the upcoming flu season   Why is this important?   You won't feel high blood pressure, but it can still hurt your blood vessels.  High blood pressure can cause heart or kidney problems. It can also cause a stroke.  Making lifestyle changes like losing a little weight or eating less salt will help.  Checking your blood pressure at home and at different times of the day can help to control blood pressure.  If the doctor prescribes medicine remember to take it the way the doctor ordered.  Call the office if you cannot afford the medicine or if there are questions about it.            Heart Attack A heart attack  occurs when blood and oxygen supply to the heart is cut off. A heart attack causes damage to the heart that cannot be fixed. A heart attack is also called a myocardial infarction, or MI. If you think you are having a heart attack, do not wait to see if the symptoms will go away. Get medical help right away. What are the causes? This condition may be caused by: A fatty substance (plaque) in the blood vessels (arteries). This can block the flow of blood to the heart. A blood clot in the blood vessels that go to the heart. The blood clot blocks blood flow. Low blood pressure. An abnormal heartbeat. Some diseases, such as problems in red blood cells (anemia)orproblems in breathing (respiratory failure). Tightening (spasm) of a blood vessel that cuts off blood to the heart. A tear in a blood vessel of the heart. High blood pressure. What increases the risk? The following factors may make you more likely to develop this condition: Aging. The older you are, the higher your risk. Having a personal or family history of chest pain, heart attack, stroke, or narrowing of the arteries in the legs, arms, head, or stomach (peripheral artery disease). Being male. Smoking. Not getting regular exercise. Being overweight or obese. Having high blood pressure. Having high cholesterol. Having diabetes. Drinking too much alcohol. Using illegal drugs, such as cocaine or methamphetamine. What are the signs or symptoms? Symptoms of this condition include: Chest pain. It may feel like: Crushing or squeezing. Tightness, pressure, fullness, or heaviness. Pain in the arm, neck, jaw, back, or upper body. Shortness of breath. Heartburn. Upset stomach (indigestion). Feeling like you may vomit (nauseous). Cold sweats. Feeling tired. Sudden light-headedness. How is this treated? A heart attack must be treated as soon as possible. Treatment may include: Medicines to: Break up or dissolve blood clots. Thin blood  and help prevent blood clots. Treat blood pressure. Improve blood flow to the heart. Reduce pain. Reduce cholesterol. Procedures to widen a blocked artery and keep it open. Open heart surgery. Receiving oxygen. Making your heart strong again (cardiac rehabilitation) through exercise, education, and counseling. Follow these instructions at home: Medicines Take over-the-counter and prescription medicines only as told by your doctor. You may need to take medicine: To keep your blood from clotting too easily. To control blood pressure. To  lower cholesterol. To control heart rhythms. Do not take these medicines unless your doctor says it is okay: NSAIDs, such as ibuprofen. Supplements that have vitamin A, vitamin E, or both. Hormone replacement therapy that has estrogen with or without progestin. Lifestyle   Do not use any products that have nicotine or tobacco, such as cigarettes, e-cigarettes, and chewing tobacco. If you need help quitting, ask your doctor. Avoid secondhand smoke. Exercise regularly. Ask your doctor about a cardiac rehab program. Eat heart-healthy foods. Your doctor will tell you what foods to eat. Stay at a healthy weight. Lower your stress level. Do not use illegal drugs. Alcohol use Do not drink alcohol if: Your doctor tells you not to drink. You are pregnant, may be pregnant, or are planning to become pregnant. If you drink alcohol: Limit how much you use to: 0-1 drink a day for women. 0-2 drinks a day for men. Know how much alcohol is in your drink. In the [place] one drink equals one 12 oz bottle of beer (355 mL), one 5 oz glass of wine (148 mL), or one 1 oz glass of hard liquor (44 mL). General instructions Work with your doctor to treat other problems you may have, such as diabetes or high blood pressure. Get screened for depression. Get treatment if needed. Keep your vaccines up to date. Get the flu shot (influenza vaccine) every year. Keep all  follow-up visits as told by your doctor. This is important. Contact a doctor if: You feel very sad. You have trouble doing your daily activities. Get help right away if: You have sudden, unexplained discomfort in your chest, arms, back, neck, jaw, or upper body. You have shortness of breath. You have sudden sweating or clammy skin. You feel like you may vomit. You vomit. You feel tired or weak. You get light-headed or dizzy. You feel your heart beating fast. You feel your heart skipping beats. You have blood pressure that is higher than 180/120. These symptoms may be an emergency. Do not wait to see if the symptoms will go away. Get medical help right away. Call your local emergency services (911 in the [place] Do not drive yourself to the hospital. Summary A heart attack occurs when blood and oxygen supply to the heart is cut off. Do not take NSAIDs unless your doctor says it is okay. Do not smoke. Avoid secondhand smoke. Exercise regularly. Ask your doctor about a cardiac rehab program. This information is not intended to replace advice given to you by your health care provider. Make sure you discuss any questions you have with your health care provider. Document Revised: 01/07/2019 Document Reviewed: 01/07/2019 Elsevier Patient Education  [place].  Stroke Prevention Some medical conditions and lifestyle choices can lead to a higher risk for a stroke. You can help to prevent a stroke by eating healthy foods and exercising. It also helps to not smoke and to manage any health problems you may have. How can this condition affect me? A stroke is an emergency. It should be treated right away. A stroke can lead to brain damage or threaten your life. There is a better chance of surviving and getting better after a stroke if you get medical help right away. What can increase my risk? The following medical conditions may increase your risk of a stroke: Diseases of the heart and  blood vessels (cardiovascular disease). High blood pressure (hypertension). Diabetes. High cholesterol. Sickle cell disease. Problems with blood clotting. Being very overweight. Sleeping problems (obstructivesleep  apnea). Other risk factors include: Being older than age 37. A history of blood clots, stroke, or mini-stroke (TIA). Race, ethnic background, or a family history of stroke. Smoking or using tobacco products. Taking birth control pills, especially if you smoke. Heavy alcohol and drug use. Not being active. What actions can I take to prevent this? Manage your health conditions High cholesterol. Eat a healthy diet. If this is not enough to manage your cholesterol, you may need to take medicines. Take medicines as told by your doctor. High blood pressure. Try to keep your blood pressure below 130/80. If your blood pressure cannot be managed through a healthy diet and regular exercise, you may need to take medicines. Take medicines as told by your doctor. Ask your doctor if you should check your blood pressure at home. Have your blood pressure checked every year. Diabetes. Eat a healthy diet and get regular exercise. If your blood sugar (glucose) cannot be managed through diet and exercise, you may need to take medicines. Take medicines as told by your doctor. Talk to your doctor about getting checked for sleeping problems. Signs of a problem can include: Snoring a lot. Feeling very tired. Make sure that you manage any other conditions you have. Nutrition  Follow instructions from your doctor about what to eat or drink. You may be told to: Eat and drink fewer calories each day. Limit how much salt (sodium) you use to 1,500 milligrams (mg) each day. Use only healthy fats for cooking, such as olive oil, canola oil, and sunflower oil. Eat healthy foods. To do this: Choose foods that are high in fiber. These include whole grains, and fresh fruits and vegetables. Eat at  least 5 servings of fruits and vegetables a day. Try to fill one-half of your plate with fruits and vegetables at each meal. Choose low-fat (lean) proteins. These include low-fat cuts of meat, chicken without skin, fish, tofu, beans, and nuts. Eat low-fat dairy products. Avoid foods that: Are high in salt. Have saturated fat. Have trans fat. Have cholesterol. Are processed or pre-made. Count how many carbohydrates you eat and drink each day. Lifestyle If you drink alcohol: Limit how much you have to: 0-1 drink a day for women who are not pregnant. 0-2 drinks a day for men. Know how much alcohol is in your drink. In the [place] one drink equals one 12 oz bottle of beer (319m), one 5 oz glass of wine (1466m, or one 1 oz glass of hard liquor (4425m Do not smoke or use any products that have nicotine or tobacco. If you need help quitting, ask your doctor. Avoid secondhand smoke. Do not use drugs. Activity  Try to stay at a healthy weight. Get at least 30 minutes of exercise on most days, such as: Fast walking. Biking. Swimming. Medicines Take over-the-counter and prescription medicines only as told by your doctor. Avoid taking birth control pills. Talk to your doctor about the risks of taking birth control pills if: You are over 35 54ars old. You smoke. You get very bad headaches. You have had a blood clot. Where to find more information American Stroke Association: www.strokeassociation.org Get help right away if: You or a loved one has any signs of a stroke. "BE FAST" is an easy way to remember the warning signs: B - Balance. Dizziness, sudden trouble walking, or loss of balance. E - Eyes. Trouble seeing or a change in how you see. F - Face. Sudden weakness or loss of feeling of the face.  The face or eyelid may droop on one side. A - Arms. Weakness or loss of feeling in an arm. This happens all of a sudden and most often on one side of the body. S - Speech. Sudden trouble  speaking, slurred speech, or trouble understanding what people say. T - Time. Time to call emergency services. Write down what time symptoms started. You or a loved one has other signs of a stroke, such as: A sudden, very bad headache with no known cause. Feeling like you may vomit (nausea). Vomiting. A seizure. These symptoms may be an emergency. Get help right away. Call your local emergency services (911 in the [place] Do not wait to see if the symptoms will go away. Do not drive yourself to the hospital. Summary You can help to prevent a stroke by eating healthy, exercising, and not smoking. It also helps to manage any health problems you have. Do not smoke or use any products that contain nicotine or tobacco. Get help right away if you or a loved one has any signs of a stroke. This information is not intended to replace advice given to you by your health care provider. Make sure you discuss any questions you have with your health care provider. Document Revised: 04/27/2020 Document Reviewed: 04/27/2020 Elsevier Patient Education  2022 [place].   Patient verbalizes understanding of instructions provided today and agrees to view in MyChart Telephone follow up appointment with care management team member scheduled for:  October 08, 2021 at 10:00 am The patient has been provided with contact information for the care management team and has been advised to call with any health related questions or concerns  Oneta Rack, RN, BSN, [place] [phone]: direct office [phone]: mobile

## 2021-07-25 ENCOUNTER — Other Ambulatory Visit: Payer: Self-pay | Admitting: Emergency Medicine

## 2021-07-25 DIAGNOSIS — Z86711 Personal history of pulmonary embolism: Secondary | ICD-10-CM

## 2021-08-09 DIAGNOSIS — E785 Hyperlipidemia, unspecified: Secondary | ICD-10-CM | POA: Diagnosis not present

## 2021-08-09 DIAGNOSIS — I10 Essential (primary) hypertension: Secondary | ICD-10-CM | POA: Diagnosis not present

## 2021-08-18 ENCOUNTER — Ambulatory Visit (INDEPENDENT_AMBULATORY_CARE_PROVIDER_SITE_OTHER): Payer: Medicare Other | Admitting: Emergency Medicine

## 2021-08-18 ENCOUNTER — Other Ambulatory Visit: Payer: Self-pay

## 2021-08-18 ENCOUNTER — Encounter: Payer: Self-pay | Admitting: Emergency Medicine

## 2021-08-18 VITALS — BP 120/70 | HR 55 | Ht 72.0 in | Wt 316.0 lb

## 2021-08-18 DIAGNOSIS — Z86711 Personal history of pulmonary embolism: Secondary | ICD-10-CM | POA: Diagnosis not present

## 2021-08-18 DIAGNOSIS — R609 Edema, unspecified: Secondary | ICD-10-CM | POA: Diagnosis not present

## 2021-08-18 DIAGNOSIS — Z7901 Long term (current) use of anticoagulants: Secondary | ICD-10-CM | POA: Diagnosis not present

## 2021-08-18 DIAGNOSIS — I10 Essential (primary) hypertension: Secondary | ICD-10-CM

## 2021-08-18 DIAGNOSIS — F411 Generalized anxiety disorder: Secondary | ICD-10-CM

## 2021-08-18 DIAGNOSIS — I2510 Atherosclerotic heart disease of native coronary artery without angina pectoris: Secondary | ICD-10-CM

## 2021-08-18 DIAGNOSIS — S81802A Unspecified open wound, left lower leg, initial encounter: Secondary | ICD-10-CM

## 2021-08-18 DIAGNOSIS — E785 Hyperlipidemia, unspecified: Secondary | ICD-10-CM

## 2021-08-18 DIAGNOSIS — Z23 Encounter for immunization: Secondary | ICD-10-CM | POA: Diagnosis not present

## 2021-08-18 MED ORDER — ALPRAZOLAM 0.5 MG PO TABS: 0.5000 mg | ORAL_TABLET | Freq: Every day | ORAL | 1 refills | Status: DC | PRN

## 2021-08-18 MED ORDER — PANTOPRAZOLE SODIUM 40 MG PO TBEC: 40.0000 mg | DELAYED_RELEASE_TABLET | Freq: Every day | ORAL | 3 refills | Status: DC

## 2021-08-18 MED ORDER — CEFADROXIL 500 MG PO CAPS: 500.0000 mg | ORAL_CAPSULE | Freq: Two times a day (BID) | ORAL | 0 refills | Status: AC

## 2021-08-18 MED ORDER — RIVAROXABAN 20 MG PO TABS: 20.0000 mg | ORAL_TABLET | Freq: Every day | ORAL | 3 refills | Status: DC

## 2021-08-18 MED ORDER — FUROSEMIDE 40 MG PO TABS: 40.0000 mg | ORAL_TABLET | Freq: Every day | ORAL | 1 refills | Status: DC

## 2021-08-18 MED ORDER — ROSUVASTATIN CALCIUM 20 MG PO TABS: 20.0000 mg | ORAL_TABLET | Freq: Every day | ORAL | 3 refills | Status: DC

## 2021-08-18 MED ORDER — LISINOPRIL 10 MG PO TABS: 10.0000 mg | ORAL_TABLET | Freq: Every day | ORAL | 3 refills | Status: DC

## 2021-08-18 NOTE — Assessment & Plan Note (Signed)
Stable.  Takes alprazolam sparingly as needed.  New refill for 0.5 mg tablets sent in.

## 2021-08-18 NOTE — Assessment & Plan Note (Signed)
Well-controlled hypertension.  Continue lisinopril 10 mg daily and Lasix 40 mg daily.

## 2021-08-18 NOTE — Progress Notes (Signed)
[name] 75 y.o.   Chief Complaint  Patient presents with   Hypertension    6 month F/U. Refill of xanax,pt would like 5 mg dosage. Refill of xarelto, furosemide, pantoprazole, and lisinopril    HISTORY OF PRESENT ILLNESS: This is a 75 y.o. male here for 33-month follow-up of chronic medical problems and medication refills. Also complaining of open wound to left lower leg leaking clear fluid.  Hypertension Pertinent negatives include no chest pain, headaches, palpitations or shortness of breath.    Prior to Admission medications   Medication Sig Start Date End Date Taking? Authorizing Provider  acetaminophen (TYLENOL) 325 MG tablet Take 2 tablets (650 mg total) by mouth every 6 (six) hours as needed for mild pain (or Fever >/= 101). 08/24/19  Yes Emokpae, Courage, MD  ALPRAZolam Duanne Moron) 0.5 MG tablet Take 1 tablet (0.5 mg total) by mouth daily as needed for anxiety. 08/18/21  Yes Nekhi Liwanag, Ines Bloomer, MD  cefadroxil (DURICEF) 500 MG capsule Take 1 capsule (500 mg total) by mouth 2 (two) times daily for 7 days. 08/18/21 08/25/21 Yes Obrien Huskins, Ines Bloomer, MD  Docusate Sodium (COLACE PO) Take by mouth daily.   Yes [provider]  furosemide (LASIX) 40 MG tablet Take 1 tablet (40 mg total) by mouth daily. Take 1/2 tab for 5 days before taking whole tab. 04/23/21  Yes Emary Zalar, Ines Bloomer, MD  lisinopril (ZESTRIL) 10 MG tablet Take 1 tablet (10 mg total) by mouth daily. 04/23/21  Yes Ellieanna Funderburg, Ines Bloomer, MD  pantoprazole (PROTONIX) 40 MG tablet Take 1 tablet (40 mg total) by mouth daily. 04/23/21 04/23/22 Yes Tyreon Frigon, Ines Bloomer, MD  rosuvastatin (CRESTOR) 20 MG tablet Take 1 tablet (20 mg total) by mouth daily. 04/27/21  Yes Nirel Babler, Ines Bloomer, MD  Sennosides (SENOKOT PO) Take by mouth daily.   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   CAD in native artery 08/31/2016   Essential hypertension 08/31/2016   Hyperlipidemia LDL goal <70 08/31/2016   Family history of heart disease in male family member before age 77 08/31/2016   Hearing loss, bilateral 08/31/2016   Generalized anxiety disorder 08/31/2016   History of colonic polyps 08/31/2016    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    Past Surgical History:  Procedure Laterality Date   CORONARY ANGIOPLASTY WITH STENT PLACEMENT     FRACTURE SURGERY  1995   right elbow   KNEE ARTHROSCOPY W/ MENISCAL REPAIR      Social History   Socioeconomic History   Marital status: Married    Spouse name: rose   Number of children: 2   Years of education: 13   Highest education level: Not on file  Occupational History   Occupation: retired    Comment: packing/shipping  Tobacco Use   Smoking  status: Former    Packs/day: 0.30    Types: Cigarettes    Start date: 08/31/1966    Quit date: 08/31/1969    Years since quitting: 52.0   Smokeless tobacco: Never  Vaping Use   Vaping Use: Never used  Substance and Sexual Activity   Alcohol use: Yes    Alcohol/week: 3.0 standard drinks    Types: 2 Cans of beer, 1 Shots of liquor per week   Drug use: No   Sexual activity: Yes  Other Topics Concern   Not on file  Social History Narrative   Not on file   Social Determinants of Health   Financial Resource Strain: Low Risk    Difficulty of Paying Living Expenses: Not hard at all  Food  Insecurity: No Food Insecurity   Worried About Charity fundraiser in the Last Year: Never true   [place] in the Last Year: Never true  Transportation Needs: No Transportation Needs   Lack of Transportation (Medical): No   Lack of Transportation (Non-Medical): No  Physical Activity: Insufficiently Active   Days of Exercise per Week: 5 days   Minutes of Exercise per Session: 20 min  Stress: No Stress Concern Present   Feeling of Stress : Only a little  Social Connections: Moderately Isolated   Frequency of Communication with Friends and Family: Once a week   Frequency of Social Gatherings with Friends and Family: More than three times a week   Attends Religious Services: Never   Marine scientist or Organizations: No   Attends Music therapist: Never   Marital Status: Married  Human resources officer Violence: Not At Risk   Fear of Current or Ex-Partner: No   Emotionally Abused: No   Physically Abused: No   Sexually Abused: No    Family History  Problem Relation Age of Onset   Heart disease Mother 19       heart attack   Lung disease Father        asbestosis   Clotting disorder Father    Asthma Daughter    Hyperlipidemia Daughter    Alcohol abuse Paternal Uncle    Cancer Paternal Grandfather        lung     Review of Systems  Constitutional: Negative.  Negative for chills and fever.  HENT: Negative.  Negative for congestion and sore throat.   Respiratory: Negative.  Negative for cough and shortness of breath.   Cardiovascular: Negative.  Negative for chest pain and palpitations.  Gastrointestinal:  Negative for abdominal pain, diarrhea, nausea and vomiting.  Genitourinary: Negative.   Skin: Negative.  Negative for rash.  Neurological:  Negative for dizziness and headaches.  All other systems reviewed and are negative. Today's Vitals   08/18/21 1054 08/18/21 1149  BP: (!) 142/82 120/70  Pulse: (!) 55   SpO2: 96%   Weight: (!) 316 lb (143.3 kg)    Height: 6' (1.829 m)    Body mass index is 42.86 kg/m.   Physical Exam Vitals reviewed.  Constitutional:      Appearance: Normal appearance. He is obese.  HENT:     Head: Normocephalic.  Eyes:     Extraocular Movements: Extraocular movements intact.     Pupils: Pupils are equal, round, and reactive to light.  Cardiovascular:     Rate and Rhythm: Normal rate and regular rhythm.     Pulses: Normal pulses.     Heart sounds: Normal heart sounds.  Pulmonary:  Effort: Pulmonary effort is normal.     Breath sounds: Normal breath sounds.  Musculoskeletal:     Cervical back: Normal range of motion and neck supple.  Skin:    General: Skin is warm and dry.     Capillary Refill: Capillary refill takes less than 2 seconds.     Comments: Open wound to left lower lobe with surrounding erythema.  See picture below.  Neurological:     General: No focal deficit present.     Mental Status: He is alert and oriented to person, place, and time.  Psychiatric:        Mood and Affect: Mood normal.        Behavior: Behavior normal.      ASSESSMENT & PLAN: Problem List Items Addressed This Visit       Cardiovascular and Mediastinum   Essential hypertension    Well-controlled hypertension.  Continue lisinopril 10 mg daily and Lasix 40 mg daily.      Relevant Medications   furosemide (LASIX) 40 MG tablet   lisinopril (ZESTRIL) 10 MG tablet   rosuvastatin (CRESTOR) 20 MG tablet   rivaroxaban (XARELTO) 20 MG TABS tablet     Other   Hyperlipidemia LDL goal <70    Stable.  Continue rosuvastatin 20 mg daily.      Relevant Medications   furosemide (LASIX) 40 MG tablet   lisinopril (ZESTRIL) 10 MG tablet   rosuvastatin (CRESTOR) 20 MG tablet   rivaroxaban (XARELTO) 20 MG TABS tablet   Generalized anxiety disorder    Stable.  Takes alprazolam sparingly as needed.  New refill for 0.5 mg tablets sent in.      Relevant Medications   ALPRAZolam (XANAX) 0.5 MG tablet   History of  pulmonary embolism   Relevant Medications   rivaroxaban (XARELTO) 20 MG TABS tablet   Current use of long term anticoagulation    Stable.  On Xarelto 20 mg daily.      Open wound of left lower leg - Primary    Appears infected.  We will start Duricef 500 mg twice a day for 7 days.  Referral to wound care center placed today.      Relevant Medications   cefadroxil (DURICEF) 500 MG capsule   Other Relevant Orders   AMB referral to wound care center   Other Visit Diagnoses     Need for influenza vaccination       Relevant Orders   Flu Vaccine QUAD High Dose(Fluad)   Edema, unspecified type       Relevant Medications   furosemide (LASIX) 40 MG tablet      Patient Instructions  Health Maintenance After Age 5 After age 5, you are at a higher risk for certain long-term diseases and infections as well as injuries from falls. Falls are a major cause of broken bones and head injuries in people who are older than age 89. Getting regular preventive care can help to keep you healthy and well. Preventive care includes getting regular testing and making lifestyle changes as recommended by your health care provider. Talk with your health care provider about: Which screenings and tests you should have. A screening is a test that checks for a disease when you have no symptoms. A diet and exercise plan that is right for you. What should I know about screenings and tests to prevent falls? Screening and testing are the best ways to find a health problem early. Early diagnosis and treatment give you the best  chance of managing medical conditions that are common after age 71. Certain conditions and lifestyle choices may make you more likely to have a fall. Your health care provider may recommend: Regular vision checks. Poor vision and conditions such as cataracts can make you more likely to have a fall. If you wear glasses, make sure to get your prescription updated if your vision changes. Medicine  review. Work with your health care provider to regularly review all of the medicines you are taking, including over-the-counter medicines. Ask your health care provider about any side effects that may make you more likely to have a fall. Tell your health care provider if any medicines that you take make you feel dizzy or sleepy. Strength and balance checks. Your health care provider may recommend certain tests to check your strength and balance while standing, walking, or changing positions. Foot health exam. Foot pain and numbness, as well as not wearing proper footwear, can make you more likely to have a fall. Screenings, including: Osteoporosis screening. Osteoporosis is a condition that causes the bones to get weaker and break more easily. Blood pressure screening. Blood pressure changes and medicines to control blood pressure can make you feel dizzy. Depression screening. You may be more likely to have a fall if you have a fear of falling, feel depressed, or feel unable to do activities that you used to do. Alcohol use screening. Using too much alcohol can affect your balance and may make you more likely to have a fall. Follow these instructions at home: Lifestyle Do not drink alcohol if: Your health care provider tells you not to drink. If you drink alcohol: Limit how much you have to: 0-1 drink a day for women. 0-2 drinks a day for men. Know how much alcohol is in your drink. In the [place] one drink equals one 12 oz bottle of beer (355 mL), one 5 oz glass of wine (148 mL), or one 1 oz glass of hard liquor (44 mL). Do not use any products that contain nicotine or tobacco. These products include cigarettes, chewing tobacco, and vaping devices, such as e-cigarettes. If you need help quitting, ask your health care provider. Activity  Follow a regular exercise program to stay fit. This will help you maintain your balance. Ask your health care provider what types of exercise are appropriate for  you. If you need a cane or walker, use it as recommended by your health care provider. Wear supportive shoes that have nonskid soles. Safety  Remove any tripping hazards, such as rugs, cords, and clutter. Install safety equipment such as grab bars in bathrooms and safety rails on stairs. Keep rooms and walkways well-lit. General instructions Talk with your health care provider about your risks for falling. Tell your health care provider if: You fall. Be sure to tell your health care provider about all falls, even ones that seem minor. You feel dizzy, tiredness (fatigue), or off-balance. Take over-the-counter and prescription medicines only as told by your health care provider. These include supplements. Eat a healthy diet and maintain a healthy weight. A healthy diet includes low-fat dairy products, low-fat (lean) meats, and fiber from whole grains, beans, and lots of fruits and vegetables. Stay current with your vaccines. Schedule regular health, dental, and eye exams. Summary Having a healthy lifestyle and getting preventive care can help to protect your health and wellness after age 57. Screening and testing are the best way to find a health problem early and help you avoid having a fall.  Early diagnosis and treatment give you the best chance for managing medical conditions that are more common for people who are older than age 56. Falls are a major cause of broken bones and head injuries in people who are older than age 52. Take precautions to prevent a fall at home. Work with your health care provider to learn what changes you can make to improve your health and wellness and to prevent falls. This information is not intended to replace advice given to you by your health care provider. Make sure you discuss any questions you have with your health care provider. Document Revised: 02/15/2021 Document Reviewed: 02/15/2021 Elsevier Patient Education  2022 [place],  MD [facility]

## 2021-08-18 NOTE — Assessment & Plan Note (Signed)
Stable.  Continue rosuvastatin 20 mg daily. ?

## 2021-08-18 NOTE — Assessment & Plan Note (Signed)
Appears infected.  We will start Duricef 500 mg twice a day for 7 days.  Referral to wound care center placed today.

## 2021-08-18 NOTE — Patient Instructions (Signed)
Health Maintenance After Age 73 After age 73, you are at a higher risk for certain long-term diseases and infections as well as injuries from falls. Falls are a major cause of broken bones and head injuries in people who are older than age 73. Getting regular preventive care can help to keep you healthy and well. Preventive care includes getting regular testing and making lifestyle changes as recommended by your health care provider. Talk with your health care provider about: Which screenings and tests you should have. A screening is a test that checks for a disease when you have no symptoms. A diet and exercise plan that is right for you. What should I know about screenings and tests to prevent falls? Screening and testing are the best ways to find a health problem early. Early diagnosis and treatment give you the best chance of managing medical conditions that are common after age 73. Certain conditions and lifestyle choices may make you more likely to have a fall. Your health care provider may recommend: Regular vision checks. Poor vision and conditions such as cataracts can make you more likely to have a fall. If you wear glasses, make sure to get your prescription updated if your vision changes. Medicine review. Work with your health care provider to regularly review all of the medicines you are taking, including over-the-counter medicines. Ask your health care provider about any side effects that may make you more likely to have a fall. Tell your health care provider if any medicines that you take make you feel dizzy or sleepy. Strength and balance checks. Your health care provider may recommend certain tests to check your strength and balance while standing, walking, or changing positions. Foot health exam. Foot pain and numbness, as well as not wearing proper footwear, can make you more likely to have a fall. Screenings, including: Osteoporosis screening. Osteoporosis is a condition that causes  the bones to get weaker and break more easily. Blood pressure screening. Blood pressure changes and medicines to control blood pressure can make you feel dizzy. Depression screening. You may be more likely to have a fall if you have a fear of falling, feel depressed, or feel unable to do activities that you used to do. Alcohol use screening. Using too much alcohol can affect your balance and may make you more likely to have a fall. Follow these instructions at home: Lifestyle Do not drink alcohol if: Your health care provider tells you not to drink. If you drink alcohol: Limit how much you have to: 0-1 drink a day for women. 0-2 drinks a day for men. Know how much alcohol is in your drink. In the [place] one drink equals one 12 oz bottle of beer (355 mL), one 5 oz glass of wine (148 mL), or one 1 oz glass of hard liquor (44 mL). Do not use any products that contain nicotine or tobacco. These products include cigarettes, chewing tobacco, and vaping devices, such as e-cigarettes. If you need help quitting, ask your health care provider. Activity  Follow a regular exercise program to stay fit. This will help you maintain your balance. Ask your health care provider what types of exercise are appropriate for you. If you need a cane or walker, use it as recommended by your health care provider. Wear supportive shoes that have nonskid soles. Safety  Remove any tripping hazards, such as rugs, cords, and clutter. Install safety equipment such as grab bars in bathrooms and safety rails on stairs. Keep rooms and walkways   well-lit. General instructions Talk with your health care provider about your risks for falling. Tell your health care provider if: You fall. Be sure to tell your health care provider about all falls, even ones that seem minor. You feel dizzy, tiredness (fatigue), or off-balance. Take over-the-counter and prescription medicines only as told by your health care provider. These include  supplements. Eat a healthy diet and maintain a healthy weight. A healthy diet includes low-fat dairy products, low-fat (lean) meats, and fiber from whole grains, beans, and lots of fruits and vegetables. Stay current with your vaccines. Schedule regular health, dental, and eye exams. Summary Having a healthy lifestyle and getting preventive care can help to protect your health and wellness after age 73. Screening and testing are the best way to find a health problem early and help you avoid having a fall. Early diagnosis and treatment give you the best chance for managing medical conditions that are more common for people who are older than age 73. Falls are a major cause of broken bones and head injuries in people who are older than age 73. Take precautions to prevent a fall at home. Work with your health care provider to learn what changes you can make to improve your health and wellness and to prevent falls. This information is not intended to replace advice given to you by your health care provider. Make sure you discuss any questions you have with your health care provider. Document Revised: 02/15/2021 Document Reviewed: 02/15/2021 Elsevier Patient Education  2022 [employer]  

## 2021-08-18 NOTE — Assessment & Plan Note (Signed)
Stable.  On Xarelto 20 mg daily.

## 2021-08-23 ENCOUNTER — Encounter (HOSPITAL_BASED_OUTPATIENT_CLINIC_OR_DEPARTMENT_OTHER): Payer: Medicare Other | Attending: Internal Medicine | Admitting: Internal Medicine

## 2021-08-23 ENCOUNTER — Other Ambulatory Visit: Payer: Self-pay

## 2021-08-23 DIAGNOSIS — I10 Essential (primary) hypertension: Secondary | ICD-10-CM | POA: Diagnosis not present

## 2021-08-23 DIAGNOSIS — E785 Hyperlipidemia, unspecified: Secondary | ICD-10-CM | POA: Insufficient documentation

## 2021-08-23 DIAGNOSIS — I2510 Atherosclerotic heart disease of native coronary artery without angina pectoris: Secondary | ICD-10-CM | POA: Diagnosis not present

## 2021-08-23 DIAGNOSIS — L97821 Non-pressure chronic ulcer of other part of left lower leg limited to breakdown of skin: Secondary | ICD-10-CM | POA: Diagnosis not present

## 2021-08-23 DIAGNOSIS — Z87891 Personal history of nicotine dependence: Secondary | ICD-10-CM | POA: Diagnosis not present

## 2021-08-23 DIAGNOSIS — I87312 Chronic venous hypertension (idiopathic) with ulcer of left lower extremity: Secondary | ICD-10-CM | POA: Insufficient documentation

## 2021-08-23 NOTE — Progress Notes (Signed)
LAWRENCE, MITCH ([id number]) Visit Report for 08/23/2021 Chief Complaint Document Details Patient Name: Date of Service: ILIAN, WESSELL RLES 08/23/2021 12:30 PM Medical Record Number: [id number] Patient Account Number: [id number] Date of Birth/Sex: Treating RN: [birth year] (75 y.o. Marcheta Grammes Primary Care Provider: Agustina Caroli Other Clinician: Referring Provider: Treating Provider/Extender: Argie Ramming Weeks in Treatment: 0 Information Obtained from: Patient Chief Complaint Left lower extremity wound Electronic Signature(s) Signed: 08/23/2021 1:50:12 PM By: [name] DO Entered By: [name] on 08/23/2021 13:38:45 -------------------------------------------------------------------------------- HPI Details Patient Name: Date of Service: NAPOLEAN, SIA RLES 08/23/2021 12:30 PM Medical Record Number: [id number] Patient Account Number: [id number] Date of Birth/Sex: Treating RN: Mar 14, 1946 (75 y.o. Marcheta Grammes Primary Care Provider: Agustina Caroli Other Clinician: Referring Provider: Treating Provider/Extender: Argie Ramming Weeks in Treatment: 0 History of Present Illness HPI Description: Admission 08/23/2021 Mr. Dyer Klug is a 75 year old male with a past medical history of essential hypertension, coronary artery disease and hyperlipidemia that presents to the clinic for a wound to his left lower extremity. He states that 2 months ago he scratched the back of his leg creating a small wound that has not healed. He states it drains serous fluid constantly. He has been using Neosporin to the area and keeping it covered. He recently saw his primary care physician and was recommended compression stockings and an antibiotic. Patient reports improvement in redness with the use of Duricef. He has been wearing his compression stockings for the past week. He is also on a diuretic for his hypertension. He denies signs of  infection. Electronic Signature(s) Signed: 08/23/2021 1:50:12 PM By: [name] DO Entered By: [name] on 08/23/2021 13:42:26 -------------------------------------------------------------------------------- Physical Exam Details Patient Name: Date of Service: LAYDEN, CATERINO RLES 08/23/2021 12:30 PM Medical Record Number: [id number] Patient Account Number: [id number] Date of Birth/Sex: Treating RN: Aug 20, 1946 (75 y.o. Marcheta Grammes Primary Care Provider: Agustina Caroli Other Clinician: Referring Provider: Treating Provider/Extender: Argie Ramming Weeks in Treatment: 0 Constitutional respirations regular, non-labored and within target range for patient.. Cardiovascular 2+ dorsalis pedis/posterior tibialis pulses. Psychiatric pleasant and cooperative. Notes Left lower extremity: T the posterior aspect there is a small open wound limited to skin breakdown. No signs of infection. 2+ pitting edema to the knee. o Varicose veins noted. Electronic Signature(s) Signed: 08/23/2021 1:50:12 PM By: [name] DO Entered By: [name] on 08/23/2021 13:43:08 -------------------------------------------------------------------------------- Physician Orders Details Patient Name: Date of Service: COLTIN, CASHER RLES 08/23/2021 12:30 PM Medical Record Number: [id number] Patient Account Number: [id number] Date of Birth/Sex: Treating RN: 1946/08/24 (75 y.o. [name] Primary Care Provider: Agustina Caroli Other Clinician: Referring Provider: Treating Provider/Extender: Argie Ramming Weeks in Treatment: 0 Verbal / Phone Orders: No Diagnosis Coding ICD-10 Coding Code Description [id number] Non-pressure chronic ulcer of other part of left lower leg limited to breakdown of skin I87.312 Chronic venous hypertension (idiopathic) with ulcer of left lower extremity I10 Essential (primary) hypertension Follow-up  Appointments ppointment in 1 week. - Dr. Heber [place] Return A Bathing/ Shower/ Hygiene May shower with protection but do not get wound dressing(s) wet. - Ok to use Market researcher, can purchase at [employer], [facility], or [employer] Edema Control - Lymphedema / SCD / Other Elevate legs to the level of the heart or above for 30 minutes daily and/or when sitting, a frequency of: - throughout the day Avoid standing for long periods of time. Exercise regularly Compression stocking or Garment 20-30 mm/Hg pressure to: - right leg daily Wound Treatment Wound #  1 - Lower Leg Wound Laterality: Left, Posterior Cleanser: Soap and Water 1 x Per Week Discharge Instructions: May shower and wash wound with dial antibacterial soap and water prior to dressing change. Cleanser: Wound Cleanser 1 x Per Week Discharge Instructions: Cleanse the wound with wound cleanser prior to applying a clean dressing using gauze sponges, not tissue or cotton balls. Peri-Wound Care: Sween Lotion (Moisturizing lotion) 1 x Per Week Discharge Instructions: Apply moisturizing lotion as directed Prim Dressing: KerraCel Ag Gelling Fiber Dressing, 2x2 in (silver alginate) 1 x Per Week ary Discharge Instructions: Apply silver alginate to wound bed as instructed Secondary Dressing: ABD Pad, 8x10 1 x Per Week Discharge Instructions: Apply over primary dressing as directed. Secondary Dressing: Zetuvit Plus 4x8 in 1 x Per Week Discharge Instructions: Apply over primary dressing as directed. Compression Wrap: ThreePress (3 layer compression wrap) 1 x Per Week Discharge Instructions: Apply three layer compression as directed. Electronic Signature(s) Signed: 08/23/2021 1:50:12 PM By: [name] DO Entered By: [name] on 08/23/2021 13:43:43 -------------------------------------------------------------------------------- Problem List Details Patient Name: Date of Service: SALIM, FORERO RLES 08/23/2021 12:30 PM Medical Record Number:  [id number] Patient Account Number: [id number] Date of Birth/Sex: Treating RN: 1946/04/07 (75 y.o. Marcheta Grammes Primary Care Provider: Agustina Caroli Other Clinician: Referring Provider: Treating Provider/Extender: Argie Ramming Weeks in Treatment: 0 Active Problems ICD-10 Encounter Code Description Active Date MDM Diagnosis [phone] Non-pressure chronic ulcer of other part of left lower leg limited to breakdown 08/23/2021 No Yes of skin I87.312 Chronic venous hypertension (idiopathic) with ulcer of left lower extremity 08/23/2021 No Yes I10 Essential (primary) hypertension 08/23/2021 No Yes Inactive Problems Resolved Problems Electronic Signature(s) Signed: 08/23/2021 1:50:12 PM By: [name] DO Entered By: [name] on 08/23/2021 13:38:17 -------------------------------------------------------------------------------- Progress Note Details Patient Name: Date of Service: TRESEAN, MATTIX RLES 08/23/2021 12:30 PM Medical Record Number: [id number] Patient Account Number: [id number] Date of Birth/Sex: Treating RN: 02-16-1946 (75 y.o. Marcheta Grammes Primary Care Provider: Agustina Caroli Other Clinician: Referring Provider: Treating Provider/Extender: Argie Ramming Weeks in Treatment: 0 Subjective Chief Complaint Information obtained from Patient Left lower extremity wound History of Present Illness (HPI) Admission 08/23/2021 Mr. Tishawn Friedhoff is a 75 year old male with a past medical history of essential hypertension, coronary artery disease and hyperlipidemia that presents to the clinic for a wound to his left lower extremity. He states that 2 months ago he scratched the back of his leg creating a small wound that has not healed. He states it drains serous fluid constantly. He has been using Neosporin to the area and keeping it covered. He recently saw his primary care physician and was recommended compression  stockings and an antibiotic. Patient reports improvement in redness with the use of Duricef. He has been wearing his compression stockings for the past week. He is also on a diuretic for his hypertension. He denies signs of infection. Patient History Information obtained from Patient. Allergies No Known Drug Allergies Family History Unknown History. Social History Former smoker - quit 50 years ago, Marital Status - Married, Alcohol Use - Rarely, Drug Use - No History, Caffeine Use - Rarely. Medical History Cardiovascular Patient has history of Coronary Artery Disease - stents x 3, Hypertension Musculoskeletal Patient has history of Osteoarthritis Medical A Surgical History Notes nd Respiratory Hx Pulmonary Embolism Cardiovascular Hypertension Oncologic Melanoma on back 2018 Review of Systems (ROS) Constitutional Symptoms (General Health) Denies complaints or symptoms of Fatigue, Fever, Chills, Marked Weight Change. Eyes Denies complaints or symptoms of  Dry Eyes, Vision Changes, Glasses / Contacts. Ear/Nose/Mouth/Throat Denies complaints or symptoms of Chronic sinus problems or rhinitis. Respiratory Denies complaints or symptoms of Chronic or frequent coughs, Shortness of Breath. Gastrointestinal Denies complaints or symptoms of Frequent diarrhea, Nausea, Vomiting. Endocrine Denies complaints or symptoms of Heat/cold intolerance. Genitourinary Denies complaints or symptoms of Frequent urination. Integumentary (Skin) Complains or has symptoms of Wounds - wound on left lower leg. Neurologic Denies complaints or symptoms of Numbness/parasthesias. Psychiatric Denies complaints or symptoms of Claustrophobia, Suicidal. Objective Constitutional respirations regular, non-labored and within target range for patient.. Vitals Time Taken: 12:41 PM, Height: 73 in, Source: Stated, Weight: 315 lbs, Source: Stated, BMI: 41.6, Temperature: 98.4 F, Pulse: 68 bpm, Respiratory Rate: 18  breaths/min, Blood Pressure: 129/88 mmHg. Cardiovascular 2+ dorsalis pedis/posterior tibialis pulses. Psychiatric pleasant and cooperative. General Notes: Left lower extremity: T the posterior aspect there is a small open wound limited to skin breakdown. No signs of infection. 2+ pitting edema to o the knee. Varicose veins noted. Integumentary (Hair, Skin) Wound #1 status is Open. Original cause of wound was Gradually Appeared. The date acquired was: 06/10/2021. The wound is located on the Left,Posterior Lower Leg. The wound measures 0.5cm length x 0.4cm width x 0.1cm depth; 0.157cm^2 area and 0.016cm^3 volume. There is Fat Layer (Subcutaneous Tissue) exposed. There is no tunneling or undermining noted. There is a medium amount of serous drainage noted. The wound margin is flat and intact. There is large (67-100%) red, pink granulation within the wound bed. There is no necrotic tissue within the wound bed. 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 This appears to be caused by a scratch that did not heal due to venous insufficiency/lymphedema. There were no signs of infection on exam. At this time I recommended using a dressing under compression therapy. He was agreeable with the plan. Patient knows he cannot get this wet. Also recommended he take the compression wrap off if he experiences pain. His ABIs were 1.18. Follow-up in 1 week. Procedures Wound #1 Pre-procedure diagnosis of Wound #1 is a Venous Leg Ulcer located on the Left,Posterior Lower Leg . There was a Three Layer Compression Therapy Procedure by Levan Hurst, RN. Post procedure Diagnosis Wound #1: Same as Pre-Procedure Plan Follow-up  Appointments: Return Appointment in 1 week. - Dr. Heber [place] Bathing/ Shower/ Hygiene: May shower with protection but do not get wound dressing(s) wet. - Ok to use Market researcher, can purchase at [employer], [facility], or [employer] Edema Control - Lymphedema / SCD / Other: Elevate legs to the level of the heart or above for 30 minutes daily and/or when sitting, a frequency of: - throughout the day Avoid standing for long periods of time. Exercise regularly Compression stocking or Garment 20-30 mm/Hg pressure to: - right leg daily WOUND #1: - Lower Leg Wound Laterality: Left, Posterior Cleanser: Soap and Water 1 x Per Week/ Discharge Instructions: May shower and wash wound with dial antibacterial soap and water prior to dressing change. Cleanser: Wound Cleanser 1 x Per Week/ Discharge Instructions: Cleanse the wound with wound cleanser prior to applying a clean dressing using gauze sponges, not tissue or cotton balls. Peri-Wound Care: Sween Lotion (Moisturizing lotion) 1 x Per Week/ Discharge Instructions: Apply moisturizing lotion as directed Prim Dressing: KerraCel Ag Gelling Fiber Dressing,  2x2 in (silver alginate) 1 x Per Week/ ary Discharge Instructions: Apply silver alginate to wound bed as instructed Secondary Dressing: ABD Pad, 8x10 1 x Per Week/ Discharge Instructions: Apply over primary dressing as directed. Secondary Dressing: Zetuvit Plus 4x8 in 1 x Per Week/ Discharge Instructions: Apply over primary dressing as directed. Com pression Wrap: ThreePress (3 layer compression wrap) 1 x Per Week/ Discharge Instructions: Apply three layer compression as directed. 1. Silver alginate under 3 layer compression 2. Follow-up in 1 week Electronic Signature(s) Signed: 08/23/2021 1:50:12 PM By: [name] DO Entered By: [name] on 08/23/2021 13:49:31 -------------------------------------------------------------------------------- HxROS Details Patient Name: Date of Service: LEVORN, OLESKI RLES 08/23/2021 12:30 PM Medical Record Number: [id number] Patient Account Number: [id number] Date of Birth/Sex: Treating RN: Sep 04, 1946 (75 y.o. [name] Primary Care Provider: Agustina Caroli Other Clinician: Referring Provider: Treating Provider/Extender: Argie Ramming Weeks in Treatment: 0 Information Obtained From Patient Constitutional Symptoms (General Health) Complaints and Symptoms: Negative for: Fatigue; Fever; Chills; Marked Weight Change Eyes Complaints and Symptoms: Negative for: Dry Eyes; Vision Changes; Glasses / Contacts Ear/Nose/Mouth/Throat Complaints and Symptoms: Negative for: Chronic sinus problems or rhinitis Respiratory Complaints and Symptoms: Negative for: Chronic or frequent coughs; Shortness of Breath Medical History: Past Medical History Notes: Hx Pulmonary Embolism Gastrointestinal Complaints and Symptoms: Negative for: Frequent diarrhea; Nausea; Vomiting Endocrine Complaints and Symptoms: Negative for: Heat/cold intolerance Genitourinary Complaints and Symptoms: Negative for: Frequent urination Integumentary (Skin) Complaints and Symptoms: Positive for: Wounds - wound on left lower leg Neurologic Complaints and Symptoms: Negative for: Numbness/parasthesias Psychiatric Complaints and Symptoms: Negative for: Claustrophobia; Suicidal Hematologic/Lymphatic Cardiovascular Medical History: Positive for: Coronary Artery Disease - stents x 3; Hypertension Past Medical History Notes: Hypertension Immunological Musculoskeletal Medical History: Positive for: Osteoarthritis Oncologic Medical History: Past Medical History Notes: Melanoma on back 2018 Immunizations Pneumococcal Vaccine: Received Pneumococcal Vaccination: Yes Received Pneumococcal Vaccination On or After 60th Birthday: Yes Implantable Devices None Family and Social History Unknown History: Yes; Former smoker - quit 50 years ago; Marital  Status - Married; Alcohol Use: Rarely; Drug Use: No History; Caffeine Use: Rarely; Financial Concerns: No; Food, Clothing or Shelter Needs: No; Support System Lacking: No; Transportation Concerns: No Electronic Signature(s) Signed: 08/23/2021 1:50:12 PM By: [name] DO Signed: 08/23/2021 4:53:22 PM By: Levan Hurst RN, BSN Entered By: Levan Hurst on 08/23/2021 12:54:37 -------------------------------------------------------------------------------- [place] Details Patient Name: Date of Service: THEODUS, RAN RLES 08/23/2021 Medical Record Number: [id number] Patient Account Number: [id number] Date of Birth/Sex: Treating RN: March 18, 1946 (75 y.o. [name] Primary Care Provider: Agustina Caroli Other Clinician: Referring Provider: Treating Provider/Extender: Argie Ramming Weeks in Treatment: 0 Diagnosis Coding ICD-10 Codes Code Description [phone] Non-pressure chronic ulcer of other part of left lower leg limited to breakdown of skin I87.312 Chronic venous hypertension (idiopathic) with ulcer of left lower extremity I10 Essential (primary) hypertension Facility Procedures CPT4 Code: [id number] Description: [facility] VISIT-LEV 3 EST PT Modifier: 25 Quantity: 1 CPT4 Code: [id number] Description: (Facility Use Only) [id number] - [place] LFYBOF LWR LT LEG Modifier: Quantity: 1 Physician Procedures : CPT4 Code Description Modifier [id number] WC PHYS LEVEL 3 NEW PT ICD-10 Diagnosis Description L97.821 Non-pressure chronic ulcer of other part of left lower leg limited to breakdown of skin I87.312 Chronic venous hypertension (idiopathic) with ulcer of  left lower extremity I10 Essential (primary) hypertension Quantity: 1 Electronic Signature(s) Signed: 08/23/2021 1:50:12 PM By: [name] DO Entered By: [name] on 08/23/2021 13:49:48

## 2021-08-23 NOTE — Progress Notes (Signed)
[name], BRACEWELL ([id number]) Visit Report for 08/23/2021 Allergy List Details Patient Name: Date of Service: Bryan, [name] 08/23/2021 12:30 PM Medical Record Number: [id number] Patient Account Number: [id number] Date of Birth/Sex: Treating RN: 01/31/1946 (75 y.o. [name] Primary Care Davari Lopes: Agustina Caroli Other Clinician: Referring Janay Canan: Treating Evetta Renner/Extender: Argie Ramming Weeks in Treatment: 0 Allergies Active Allergies No Known Drug Allergies Allergy Notes Electronic Signature(s) Signed: 08/23/2021 4:53:22 PM By: Levan Hurst RN, BSN Entered By: Levan Hurst on 08/23/2021 12:42:31 -------------------------------------------------------------------------------- Arrival Information Details Patient Name: Date of Service: Bryan, [name] 08/23/2021 12:30 PM Medical Record Number: [id number] Patient Account Number: [id number] Date of Birth/Sex: Treating RN: 1945/11/24 (75 y.o. [name] Primary Care Benjamen Koelling: Agustina Caroli Other Clinician: Referring Mayia Megill: Treating Lavera Vandermeer/Extender: Burnell Blanks in Treatment: 0 Visit Information Patient Arrived: Ambulatory Arrival Time: 12:36 Accompanied By: spouse Transfer Assistance: None Patient Identification Verified: Yes Secondary Verification Process Completed: Yes Patient Requires Transmission-Based Precautions: No Patient Has Alerts: Yes Patient Alerts: Patient on Blood Thinner Electronic Signature(s) Signed: 08/23/2021 4:53:22 PM By: Levan Hurst RN, BSN Entered By: Levan Hurst on 08/23/2021 12:40:26 -------------------------------------------------------------------------------- Clinic Level of Care Assessment Details Patient Name: Date of Service: Bryan, [name] 08/23/2021 12:30 PM Medical Record Number: [id number] Patient Account Number: [id number] Date of Birth/Sex: Treating RN: 11/01/1945 (75 y.o. [name] Primary  Care Aislee Landgren: Agustina Caroli Other Clinician: Referring Shene Maxfield: Treating Laretta Pyatt/Extender: Argie Ramming Weeks in Treatment: 0 Clinic Level of Care Assessment Items TOOL 1 Quantity Score X- 1 0 Use when EandM and Procedure is performed on INITIAL visit ASSESSMENTS - Nursing Assessment / Reassessment X- 1 20 General Physical Exam (combine w/ comprehensive assessment (listed just below) when performed on new pt. evals) X- 1 25 Comprehensive Assessment (HX, ROS, Risk Assessments, Wounds Hx, etc.) ASSESSMENTS - Wound and Skin Assessment / Reassessment []  - 0 Dermatologic / Skin Assessment (not related to wound area) ASSESSMENTS - Ostomy and/or Continence Assessment and Care []  - 0 Incontinence Assessment and Management []  - 0 Ostomy Care Assessment and Management (repouching, etc.) PROCESS - Coordination of Care X - Simple Patient / Family Education for ongoing care 1 15 []  - 0 Complex (extensive) Patient / Family Education for ongoing care X- 1 10 Staff obtains Programmer, systems, Records, T Results / Process Orders est []  - 0 Staff telephones HHA, Nursing Homes / Clarify orders / etc []  - 0 Routine Transfer to another Facility (non-emergent condition) []  - 0 Routine Hospital Admission (non-emergent condition) X- 1 15 New Admissions / Biomedical engineer / Ordering NPWT Apligraf, etc. , []  - 0 Emergency Hospital Admission (emergent condition) PROCESS - Special Needs []  - 0 Pediatric / Minor Patient Management []  - 0 Isolation Patient Management []  - 0 Hearing / Language / Visual special needs []  - 0 Assessment of Community assistance (transportation, D/C planning, etc.) []  - 0 Additional assistance / Altered mentation []  - 0 Support Surface(s) Assessment (bed, cushion, seat, etc.) INTERVENTIONS - Miscellaneous []  - 0 External ear exam []  - 0 Patient Transfer (multiple staff / Civil Service fast streamer / Similar devices) []  - 0 Simple Staple / Suture  removal (25 or less) []  - 0 Complex Staple / Suture removal (26 or more) []  - 0 Hypo/Hyperglycemic Management (do not check if billed separately) X- 1 15 Ankle / Brachial Index (ABI) - do not check if billed separately Has the patient been seen at the hospital within the last three years: Yes Total Score: 100 Level Of Care:  New/Established - Level 3 Electronic Signature(s) Signed: 08/23/2021 4:53:22 PM By: Levan Hurst RN, BSN Entered By: Levan Hurst on 08/23/2021 13:45:21 -------------------------------------------------------------------------------- Compression Therapy Details Patient Name: Date of Service: Bryan, [name] 08/23/2021 12:30 PM Medical Record Number: [id number] Patient Account Number: [id number] Date of Birth/Sex: Treating RN: [birth year] (75 y.o. [name] Primary Care Camya Haydon: Agustina Caroli Other Clinician: Referring Bryan Decicco: Treating Doxie Augenstein/Extender: Argie Ramming Weeks in Treatment: 0 Compression Therapy Performed for Wound Assessment: Wound #1 Left,Posterior Lower Leg Performed By: Clinician Levan Hurst, RN Compression Type: Three Layer Post Procedure Diagnosis Same as Pre-procedure Electronic Signature(s) Signed: 08/23/2021 4:53:22 PM By: Levan Hurst RN, BSN Entered By: Levan Hurst on 08/23/2021 13:22:01 -------------------------------------------------------------------------------- Encounter Discharge Information Details Patient Name: Date of Service: [name], [name] 08/23/2021 12:30 PM Medical Record Number: [id number] Patient Account Number: [id number] Date of Birth/Sex: Treating RN: 09-05-1946 (75 y.o. [name] Primary Care Daquavion Catala: Agustina Caroli Other Clinician: Referring Sage Hammill: Treating Mamoru Takeshita/Extender: Argie Ramming Weeks in Treatment: 0 Encounter Discharge Information Items Discharge Condition: Stable Ambulatory Status: Ambulatory Discharge  Destination: Home Transportation: Private Auto Accompanied By: spouse Schedule Follow-up Appointment: Yes Clinical Summary of Care: Patient Declined Electronic Signature(s) Signed: 08/23/2021 4:53:22 PM By: Levan Hurst RN, BSN Entered By: Levan Hurst on 08/23/2021 13:46:13 -------------------------------------------------------------------------------- Lower Extremity Assessment Details Patient Name: Date of Service: JERRIAN, MELLS Rich 08/23/2021 12:30 PM Medical Record Number: [id number] Patient Account Number: [id number] Date of Birth/Sex: Treating RN: 1946/07/31 (75 y.o. [name] Primary Care Imani Fiebelkorn: Agustina Caroli Other Clinician: Referring Channa Hazelett: Treating Deisi Salonga/Extender: Argie Ramming Weeks in Treatment: 0 Edema Assessment Assessed: [Left: No] [Right: No] Edema: [Left: Ye] [Right: s] Calf Left: Right: Point of Measurement: 34 cm From Medial Instep 45 cm Ankle Left: Right: Point of Measurement: 12 cm From Medial Instep 27 cm Knee To Floor Left: Right: From Medial Instep 43 cm Vascular Assessment Pulses: Dorsalis Pedis Palpable: [Left:Yes] Blood Pressure: Brachial: [Left:129] Ankle: [Left:Dorsalis Pedis: 152 1.18] Electronic Signature(s) Signed: 08/23/2021 4:53:22 PM By: Levan Hurst RN, BSN Entered By: Levan Hurst on 08/23/2021 13:00:57 -------------------------------------------------------------------------------- Multi Wound Chart Details Patient Name: Date of Service: AADIL, SUR Rich 08/23/2021 12:30 PM Medical Record Number: [id number] Patient Account Number: [id number] Date of Birth/Sex: Treating RN: [birth year] (75 y.o. Marcheta Grammes Primary Care Tovah Slavick: Agustina Caroli Other Clinician: Referring Mika Griffitts: Treating Isador Castille/Extender: Argie Ramming Weeks in Treatment: 0 Vital Signs Height(in): 73 Pulse(bpm): 76 Weight(lbs): 315 Blood Pressure(mmHg): 129/88 Body Mass  Index(BMI): 42 Temperature(F): 98.4 Respiratory Rate(breaths/min): 18 Photos: [N/A:N/A] Left, Posterior Lower Leg N/A N/A Wound Location: Gradually Appeared N/A N/A Wounding Event: Venous Leg Ulcer N/A N/A Primary Etiology: Coronary Artery Disease, N/A N/A Comorbid History: Hypertension, Osteoarthritis 06/10/2021 N/A N/A Date Acquired: 0 N/A N/A Weeks of Treatment: Open N/A N/A Wound Status: 0.5x0.4x0.1 N/A N/A Measurements L x W x D (cm) 0.157 N/A N/A A (cm) : rea 0.016 N/A N/A Volume (cm) : 0.00% N/A N/A % Reduction in Area: 0.00% N/A N/A % Reduction in Volume: Full Thickness Without Exposed N/A N/A Classification: Support Structures Medium N/A N/A Exudate Amount: Serous N/A N/A Exudate Type: amber N/A N/A Exudate Color: Flat and Intact N/A N/A Wound Margin: Large (67-100%) N/A N/A Granulation Amount: Red, Pink N/A N/A Granulation Quality: None Present (0%) N/A N/A Necrotic Amount: Fat Layer (Subcutaneous Tissue): Yes N/A N/A Exposed Structures: Fascia: No Tendon: No Muscle: No Joint: No Bone: No None N/A N/A Epithelialization: Compression Therapy N/A N/A Procedures Performed: Treatment Notes Electronic  Signature(s) Signed: 08/23/2021 1:50:12 PM By: [name] DO Signed: 08/23/2021 4:35:28 PM By: Lorrin Jackson Entered By: [name] on 08/23/2021 13:38:24 -------------------------------------------------------------------------------- [place] Details Patient Name: Date of Service: ARZELL, MCGEEHAN Rich 08/23/2021 12:30 PM Medical Record Number: [id number] Patient Account Number: [id number] Date of Birth/Sex: Treating RN: 01/31/1946 (75 y.o. [name] Primary Care Denzil Bristol: Agustina Caroli Other Clinician: Referring Sonda Coppens: Treating Annalise Mcdiarmid/Extender: Argie Ramming Weeks in Treatment: 0 Multidisciplinary Care Plan reviewed with physician Active Inactive Abuse / Safety / Falls /  Self Care Management Nursing Diagnoses: Potential for falls Potential for injury related to falls Goals: Patient will not experience any injury related to falls Date Initiated: 08/23/2021 Target Resolution Date: 09/24/2021 Goal Status: Active Patient/caregiver will verbalize/demonstrate measures taken to prevent injury and/or falls Date Initiated: 08/23/2021 Target Resolution Date: 09/24/2021 Goal Status: Active Interventions: Assess Activities of Daily Living upon admission and as needed Assess fall risk on admission and as needed Assess: immobility, friction, shearing, incontinence upon admission and as needed Assess impairment of mobility on admission and as needed per policy Assess personal safety and home safety (as indicated) on admission and as needed Assess self care needs on admission and as needed Provide education on fall prevention Provide education on personal and home safety Notes: Venous Leg Ulcer Nursing Diagnoses: Knowledge deficit related to disease process and management Goals: Patient will maintain optimal edema control Date Initiated: 08/23/2021 Target Resolution Date: 09/24/2021 Goal Status: Active Patient/caregiver will verbalize understanding of disease process and disease management Date Initiated: 08/23/2021 Target Resolution Date: 09/24/2021 Goal Status: Active Interventions: Assess peripheral edema status every visit. Compression as ordered Provide education on venous insufficiency Notes: Wound/Skin Impairment Nursing Diagnoses: Impaired tissue integrity Knowledge deficit related to ulceration/compromised skin integrity Goals: Patient/caregiver will verbalize understanding of skin care regimen Date Initiated: 08/23/2021 Target Resolution Date: 09/24/2021 Goal Status: Active Ulcer/skin breakdown will have a volume reduction of 30% by week 4 Date Initiated: 08/23/2021 Target Resolution Date: 09/24/2021 Goal Status:  Active Interventions: Assess patient/caregiver ability to obtain necessary supplies Assess patient/caregiver ability to perform ulcer/skin care regimen upon admission and as needed Assess ulceration(s) every visit Provide education on ulcer and skin care Notes: Electronic Signature(s) Signed: 08/23/2021 4:53:22 PM By: Levan Hurst RN, BSN Entered By: Levan Hurst on 08/23/2021 13:17:41 -------------------------------------------------------------------------------- Pain Assessment Details Patient Name: Date of Service: ARLANDO, LEISINGER Rich 08/23/2021 12:30 PM Medical Record Number: [id number] Patient Account Number: [id number] Date of Birth/Sex: Treating RN: 1946/06/16 (75 y.o. [name] Primary Care Zaven Klemens: Agustina Caroli Other Clinician: Referring Jalesha Plotz: Treating Piccola Arico/Extender: Argie Ramming Weeks in Treatment: 0 Active Problems Location of Pain Severity and Description of Pain Patient Has Paino No Site Locations Pain Management and Medication Current Pain Management: Electronic Signature(s) Signed: 08/23/2021 4:53:22 PM By: Levan Hurst RN, BSN Entered By: Levan Hurst on 08/23/2021 13:03:03 -------------------------------------------------------------------------------- Patient/Caregiver Education Details Patient Name: Date of Service: VIKTOR, PHILIPP Rich 11/14/2022andnbsp12:30 PM Medical Record Number: [id number] Patient Account Number: [id number] Date of Birth/Gender: Treating RN: Jun 02, 1946 (75 y.o. [name] Primary Care Physician: Agustina Caroli Other Clinician: Referring Physician: Treating Physician/Extender: Burnell Blanks in Treatment: 0 Education Assessment Education Provided To: Patient Education Topics Provided Safety: Methods: Explain/Verbal Responses: State content correctly Venous: Methods: Explain/Verbal Responses: State content correctly Wound/Skin  Impairment: Methods: Explain/Verbal Responses: State content correctly Electronic Signature(s) Signed: 08/23/2021 4:53:22 PM By: Levan Hurst RN, BSN Entered By: Levan Hurst on 08/23/2021 13:17:54 -------------------------------------------------------------------------------- Wound Assessment Details Patient Name: Date of Service: Sabra Heck,  CHA Rich 08/23/2021 12:30 PM Medical Record Number: [id number] Patient Account Number: [id number] Date of Birth/Sex: Treating RN: Aug 19, 1946 (75 y.o. [name] Primary Care Nayelli Inglis: Agustina Caroli Other Clinician: Referring Michel Eskelson: Treating Alean Kromer/Extender: Argie Ramming Weeks in Treatment: 0 Wound Status Wound Number: 1 Primary Etiology: Venous Leg Ulcer Wound Location: Left, Posterior Lower Leg Wound Status: Open Wounding Event: Gradually Appeared Comorbid History: Coronary Artery Disease, Hypertension, Osteoarthritis Date Acquired: 06/10/2021 Weeks Of Treatment: 0 Clustered Wound: No Photos Wound Measurements Length: (cm) 0.5 Width: (cm) 0.4 Depth: (cm) 0.1 Area: (cm) 0.157 Volume: (cm) 0.016 % Reduction in Area: 0% % Reduction in Volume: 0% Epithelialization: None Tunneling: No Undermining: No Wound Description Classification: Full Thickness Without Exposed Support Structures Wound Margin: Flat and Intact Exudate Amount: Medium Exudate Type: Serous Exudate Color: amber Foul Odor After Cleansing: No Slough/Fibrino No Wound Bed Granulation Amount: Large (67-100%) Exposed Structure Granulation Quality: Red, Pink Fascia Exposed: No Necrotic Amount: None Present (0%) Fat Layer (Subcutaneous Tissue) Exposed: Yes Tendon Exposed: No Muscle Exposed: No Joint Exposed: No Bone Exposed: No Treatment Notes Wound #1 (Lower Leg) Wound Laterality: Left, Posterior Cleanser Soap and Water Discharge Instruction: May shower and wash wound with dial antibacterial soap and water prior to dressing  change. Wound Cleanser Discharge Instruction: Cleanse the wound with wound cleanser prior to applying a clean dressing using gauze sponges, not tissue or cotton balls. Peri-Wound Care Sween Lotion (Moisturizing lotion) Discharge Instruction: Apply moisturizing lotion as directed Topical Primary Dressing KerraCel Ag Gelling Fiber Dressing, 2x2 in (silver alginate) Discharge Instruction: Apply silver alginate to wound bed as instructed Secondary Dressing ABD Pad, 8x10 Discharge Instruction: Apply over primary dressing as directed. Zetuvit Plus 4x8 in Discharge Instruction: Apply over primary dressing as directed. Secured With Compression Wrap ThreePress (3 layer compression wrap) Discharge Instruction: Apply three layer compression as directed. Compression Stockings Add-Ons Electronic Signature(s) Signed: 08/23/2021 4:53:22 PM By: Levan Hurst RN, BSN Entered By: Levan Hurst on 08/23/2021 13:04:12 -------------------------------------------------------------------------------- Vitals Details Patient Name: Date of Service: EDIBERTO, SENS Rich 08/23/2021 12:30 PM Medical Record Number: [id number] Patient Account Number: [id number] Date of Birth/Sex: Treating RN: 07/09/1946 (75 y.o. [name] Primary Care Haynes Giannotti: Agustina Caroli Other Clinician: Referring Haze Antillon: Treating Abshir Paolini/Extender: Argie Ramming Weeks in Treatment: 0 Vital Signs Time Taken: 12:41 Temperature (F): 98.4 Height (in): 73 Pulse (bpm): 68 Source: Stated Respiratory Rate (breaths/min): 18 Weight (lbs): 315 Blood Pressure (mmHg): 129/88 Source: Stated Reference Range: 80 - 120 mg / dl Body Mass Index (BMI): 41.6 Electronic Signature(s) Signed: 08/23/2021 4:53:22 PM By: Levan Hurst RN, BSN Entered By: Levan Hurst on 08/23/2021 12:42:14

## 2021-08-23 NOTE — Progress Notes (Signed)
NESTER, BACHUS ([id number]) Visit Report for 08/23/2021 Abuse/Suicide Risk Screen Details Patient Name: Date of Service: [name], [name] 08/23/2021 12:30 PM Medical Record Number: [id number] Patient Account Number: [id number] Date of Birth/Sex: Treating RN: 05/18/1946 (75 y.o. [name] Primary Care Leonda Cristo: Agustina Caroli Other Clinician: Referring Destanee Bedonie: Treating Cydne Grahn/Extender: Argie Ramming Weeks in Treatment: 0 Abuse/Suicide Risk Screen Items Answer ABUSE RISK SCREEN: Has anyone close to you tried to hurt or harm you recentlyo No Do you feel uncomfortable with anyone in your familyo No Has anyone forced you do things that you didnt want to doo No Electronic Signature(s) Signed: 08/23/2021 4:53:22 PM By: Levan Hurst RN, BSN Entered By: Levan Hurst on 08/23/2021 12:54:47 -------------------------------------------------------------------------------- Activities of Daily Living Details Patient Name: Date of Service: [name], [name] 08/23/2021 12:30 PM Medical Record Number: [id number] Patient Account Number: [id number] Date of Birth/Sex: Treating RN: 28-Nov-1945 (75 y.o. [name] Primary Care Margia Wiesen: Agustina Caroli Other Clinician: Referring Brittain Smithey: Treating Emelynn Rance/Extender: Argie Ramming Weeks in Treatment: 0 Activities of Daily Living Items Answer Activities of Daily Living (Please select one for each item) Drive Automobile Completely Able T Medications ake Completely Able Use T elephone Completely Able Care for Appearance Completely Able Use T oilet Completely Able Bath / Shower Completely Able Dress Self Completely Able Feed Self Completely Able Walk Completely Able Get In / Out Bed Completely Able Housework Completely Able Prepare Meals Completely [place] for Self Completely Able Electronic Signature(s) Signed: 08/23/2021 4:53:22 PM By: Levan Hurst  RN, BSN Entered By: Levan Hurst on 08/23/2021 12:55:12 -------------------------------------------------------------------------------- Education Screening Details Patient Name: Date of Service: [name], [name] 08/23/2021 12:30 PM Medical Record Number: [id number] Patient Account Number: [id number] Date of Birth/Sex: Treating RN: 12/17/1945 (75 y.o. [name] Primary Care Meekah Math: Agustina Caroli Other Clinician: Referring Star Resler: Treating Leandre Wien/Extender: Burnell Blanks in Treatment: 0 Primary Learner Assessed: Patient Learning Preferences/Education Level/Primary Language Learning Preference: Explanation, Demonstration, Printed Material Highest Education Level: College or Above Preferred Language: English Cognitive Barrier Language Barrier: No Translator Needed: No Memory Deficit: No Emotional Barrier: No Cultural/Religious Beliefs Affecting Medical Care: No Physical Barrier Impaired Vision: No Impaired Hearing: No Decreased Hand dexterity: No Knowledge/Comprehension Knowledge Level: High Comprehension Level: High Ability to understand written instructions: High Ability to understand verbal instructions: High Motivation Anxiety Level: Calm Cooperation: Cooperative Education Importance: Acknowledges Need Interest in Health Problems: Asks Questions Perception: Coherent Willingness to Engage in Self-Management High Activities: Readiness to Engage in Self-Management High Activities: Electronic Signature(s) Signed: 08/23/2021 4:53:22 PM By: Levan Hurst RN, BSN Entered By: Levan Hurst on 08/23/2021 12:55:35 -------------------------------------------------------------------------------- Fall Risk Assessment Details Patient Name: Date of Service: [name], [name] 08/23/2021 12:30 PM Medical Record Number: [id number] Patient Account Number: [id number] Date of Birth/Sex: Treating RN: 1946-07-01 (75 y.o. [name] Primary Care Amyla Heffner: Agustina Caroli Other Clinician: Referring Bairon Klemann: Treating Estefany Goebel/Extender: Argie Ramming Weeks in Treatment: 0 Fall Risk Assessment Items Have you had 2 or more falls in the last 12 monthso 0 No Have you had any fall that resulted in injury in the last 12 monthso 0 No FALLS RISK SCREEN History of falling - immediate or within 3 months 0 No Secondary diagnosis (Do you have 2 or more medical diagnoseso) 15 Yes Ambulatory aid None/bed rest/wheelchair/nurse 0 Yes Crutches/cane/walker 0 No Furniture 0 No Intravenous therapy Access/Saline/Heparin Lock 0 No Gait/Transferring Normal/ bed rest/ wheelchair 0 Yes Weak (short steps with or without shuffle, stooped  but able to lift head while walking, may seek 0 No support from furniture) Impaired (short steps with shuffle, may have difficulty arising from chair, head down, impaired 0 No balance) Mental Status Oriented to own ability 0 Yes Electronic Signature(s) Signed: 08/23/2021 4:53:22 PM By: Levan Hurst RN, BSN Entered By: Levan Hurst on 08/23/2021 12:56:10 -------------------------------------------------------------------------------- Foot Assessment Details Patient Name: Date of Service: [name], [name] 08/23/2021 12:30 PM Medical Record Number: [id number] Patient Account Number: [id number] Date of Birth/Sex: Treating RN: [birth year] (75 y.o. [name] Primary Care Barry Culverhouse: Agustina Caroli Other Clinician: Referring Rochella Benner: Treating Kydan Shanholtzer/Extender: Argie Ramming Weeks in Treatment: 0 Foot Assessment Items Site Locations + = Sensation present, - = Sensation absent, C = Callus, U = Ulcer R = Redness, W = Warmth, M = Maceration, PU = Pre-ulcerative lesion F = Fissure, S = Swelling, D = Dryness Assessment Right: Left: Other Deformity: No No Prior Foot Ulcer: No No Prior Amputation: No No Charcot Joint: No No Ambulatory  Status: Ambulatory Without Help Gait: Steady Electronic Signature(s) Signed: 08/23/2021 4:53:22 PM By: Levan Hurst RN, BSN Entered By: Levan Hurst on 08/23/2021 12:56:39 -------------------------------------------------------------------------------- Nutrition Risk Screening Details Patient Name: Date of Service: [name], [name] 08/23/2021 12:30 PM Medical Record Number: [id number] Patient Account Number: [id number] Date of Birth/Sex: Treating RN: [birth year] (75 y.o. [name] Primary Care Braxtin Bamba: Agustina Caroli Other Clinician: Referring Yaris Ferrell: Treating Terryon Pineiro/Extender: Argie Ramming Weeks in Treatment: 0 Height (in): 73 Weight (lbs): 315 Body Mass Index (BMI): 41.6 Nutrition Risk Screening Items Score Screening NUTRITION RISK SCREEN: I have an illness or condition that made me change the kind and/or amount of food I eat 0 No I eat fewer than two meals per day 0 No I eat few fruits and vegetables, or milk products 0 No I have three or more drinks of beer, liquor or wine almost every day 0 No I have tooth or mouth problems that make it hard for me to eat 0 No I don't always have enough money to buy the food I need 0 No I eat alone most of the time 0 No I take three or more different prescribed or over-the-counter drugs a day 1 Yes Without wanting to, I have lost or gained 10 pounds in the last six months 0 No I am not always physically able to shop, cook and/or feed myself 0 No Nutrition Protocols Good Risk Protocol Moderate Risk Protocol 0 Provide education on nutrition High Risk Proctocol Risk Level: Good Risk Score: 1 Electronic Signature(s) Signed: 08/23/2021 4:53:22 PM By: Levan Hurst RN, BSN Entered By: Levan Hurst on 08/23/2021 47:42:59

## 2021-08-30 ENCOUNTER — Other Ambulatory Visit: Payer: Self-pay

## 2021-08-30 ENCOUNTER — Encounter (HOSPITAL_BASED_OUTPATIENT_CLINIC_OR_DEPARTMENT_OTHER): Payer: Medicare Other | Admitting: Internal Medicine

## 2021-08-30 DIAGNOSIS — E785 Hyperlipidemia, unspecified: Secondary | ICD-10-CM | POA: Diagnosis not present

## 2021-08-30 DIAGNOSIS — I10 Essential (primary) hypertension: Secondary | ICD-10-CM | POA: Diagnosis not present

## 2021-08-30 DIAGNOSIS — Z87891 Personal history of nicotine dependence: Secondary | ICD-10-CM | POA: Diagnosis not present

## 2021-08-30 DIAGNOSIS — I2510 Atherosclerotic heart disease of native coronary artery without angina pectoris: Secondary | ICD-10-CM | POA: Diagnosis not present

## 2021-08-30 DIAGNOSIS — L97821 Non-pressure chronic ulcer of other part of left lower leg limited to breakdown of skin: Secondary | ICD-10-CM | POA: Diagnosis not present

## 2021-08-30 DIAGNOSIS — I87312 Chronic venous hypertension (idiopathic) with ulcer of left lower extremity: Secondary | ICD-10-CM

## 2021-08-30 NOTE — Progress Notes (Signed)
[name], [name] ([id number]) Visit Report for 08/30/2021 Chief Complaint Document Details Patient Name: Date of Service: [name], [name] 08/30/2021 11:00 A M Medical Record Number: [id number] Patient Account Number: [id number] Date of Birth/Sex: Treating RN: 05-Aug-1946 (75 y.o. Marcheta Grammes Primary Care Provider: Agustina Caroli Other Clinician: Referring Provider: Treating Provider/Extender: Argie Ramming Weeks in Treatment: 1 Information Obtained from: Patient Chief Complaint Left lower extremity wound Electronic Signature(s) Signed: 08/30/2021 12:31:58 PM By: [name] DO Entered By: [name] on 08/30/2021 12:26:53 -------------------------------------------------------------------------------- HPI Details Patient Name: Date of Service: [name], [name] 08/30/2021 11:00 A M Medical Record Number: [id number] Patient Account Number: [id number] Date of Birth/Sex: Treating RN: 13-Mar-1946 (75 y.o. Marcheta Grammes Primary Care Provider: Agustina Caroli Other Clinician: Referring Provider: Treating Provider/Extender: Argie Ramming Weeks in Treatment: 1 History of Present Illness HPI Description: Admission 08/23/2021 Mr. Domenik Trice is a 75 year old male with a past medical history of essential hypertension, coronary artery disease and hyperlipidemia that presents to the clinic for a wound to his left lower extremity. He states that 2 months ago he scratched the back of his leg creating a small wound that has not healed. He states it drains serous fluid constantly. He has been using Neosporin to the area and keeping it covered. He recently saw his primary care physician and was recommended compression stockings and an antibiotic. Patient reports improvement in redness with the use of Duricef. He has been wearing his compression stockings for the past week. He is also on a diuretic for his hypertension. He denies signs of  infection. 4/21; patient tolerated the compression wrap well. He has no issues or complaints today. Electronic Signature(s) Signed: 08/30/2021 12:31:58 PM By: [name] DO Entered By: [name] on 08/30/2021 12:27:20 -------------------------------------------------------------------------------- Physical Exam Details Patient Name: Date of Service: [name], [name] 08/30/2021 11:00 A M Medical Record Number: [id number] Patient Account Number: [id number] Date of Birth/Sex: Treating RN: 05-27-1946 (75 y.o. Marcheta Grammes Primary Care Provider: Other Clinician: Agustina Caroli Referring Provider: Treating Provider/Extender: Argie Ramming Weeks in Treatment: 1 Constitutional respirations regular, non-labored and within target range for patient.. Cardiovascular 2+ dorsalis pedis/posterior tibialis pulses. Psychiatric pleasant and cooperative. Notes Left lower extremity: T the posterior aspect there is epithelialization to the previous wound site. Good edema control. Surrounding skin is intact. o Electronic Signature(s) Signed: 08/30/2021 12:31:58 PM By: [name] DO Entered By: [name] on 08/30/2021 12:28:28 -------------------------------------------------------------------------------- Physician Orders Details Patient Name: Date of Service: [name], [name] 08/30/2021 11:00 A M Medical Record Number: [id number] Patient Account Number: [id number] Date of Birth/Sex: Treating RN: 13-Aug-1946 (75 y.o. Hessie Diener Primary Care Provider: Agustina Caroli Other Clinician: Referring Provider: Treating Provider/Extender: Argie Ramming Weeks in Treatment: 1 Verbal / Phone Orders: No Diagnosis Coding ICD-10 Coding Code Description [phone] Non-pressure chronic ulcer of other part of left lower leg limited to breakdown of skin I87.312 Chronic venous hypertension (idiopathic) with ulcer of left lower  extremity I10 Essential (primary) hypertension Discharge From [facility] Discharge from [place] - Call if any future wound care needs. Edema Control - Lymphedema / SCD / Other Elevate legs to the level of the heart or above for 30 minutes daily and/or when sitting, a frequency of: - throughout the day. Avoid standing for long periods of time. Exercise regularly Moisturize legs daily. - Apply to both legs at night. Compression stocking or Garment 20-30 mm/Hg pressure to: - Apply in the morning and remove at  night. Electronic Signature(s) Signed: 08/30/2021 12:31:58 PM By: [name] DO Entered By: [name] on 08/30/2021 12:28:40 -------------------------------------------------------------------------------- Problem List Details Patient Name: Date of Service: [name], [name] 08/30/2021 11:00 A M Medical Record Number: [id number] Patient Account Number: [id number] Date of Birth/Sex: Treating RN: 06-09-1946 (75 y.o. Hessie Diener Primary Care Provider: Other Clinician: Agustina Caroli Referring Provider: Treating Provider/Extender: Argie Ramming Weeks in Treatment: 1 Active Problems ICD-10 Encounter Code Description Active Date MDM Diagnosis L97.821 Non-pressure chronic ulcer of other part of left lower leg limited to breakdown 08/23/2021 No Yes of skin I87.312 Chronic venous hypertension (idiopathic) with ulcer of left lower extremity 08/23/2021 No Yes I10 Essential (primary) hypertension 08/23/2021 No Yes Inactive Problems Resolved Problems Electronic Signature(s) Signed: 08/30/2021 12:31:58 PM By: [name] DO Entered By: [name] on 08/30/2021 12:26:38 -------------------------------------------------------------------------------- Progress Note Details Patient Name: Date of Service: [name], [name] 08/30/2021 11:00 A M Medical Record Number: [id number] Patient Account Number: [id number] Date of  Birth/Sex: Treating RN: [birth year] (75 y.o. Marcheta Grammes Primary Care Provider: Agustina Caroli Other Clinician: Referring Provider: Treating Provider/Extender: Argie Ramming Weeks in Treatment: 1 Subjective Chief Complaint Information obtained from Patient Left lower extremity wound History of Present Illness (HPI) Admission 08/23/2021 Mr. [name] is a 75 year old male with a past medical history of essential hypertension, coronary artery disease and hyperlipidemia that presents to the clinic for a wound to his left lower extremity. He states that 2 months ago he scratched the back of his leg creating a small wound that has not healed. He states it drains serous fluid constantly. He has been using Neosporin to the area and keeping it covered. He recently saw his primary care physician and was recommended compression stockings and an antibiotic. Patient reports improvement in redness with the use of Duricef. He has been wearing his compression stockings for the past week. He is also on a diuretic for his hypertension. He denies signs of infection. 4/21; patient tolerated the compression wrap well. He has no issues or complaints today. Patient History Information obtained from Patient. Family History Unknown History. Social History Former smoker - quit 50 years ago, Marital Status - Married, Alcohol Use - Rarely, Drug Use - No History, Caffeine Use - Rarely. Medical History Cardiovascular Patient has history of Coronary Artery Disease - stents x 3, Hypertension Musculoskeletal Patient has history of Osteoarthritis Medical A Surgical History Notes nd Respiratory Hx Pulmonary Embolism Cardiovascular Hypertension Oncologic Melanoma on back 2018 Objective Constitutional respirations regular, non-labored and within target range for patient.. Vitals Time Taken: 10:59 AM, Height: 73 in, Weight: 315 lbs, BMI: 41.6, Temperature: 98 F, Pulse: 48 bpm,  Respiratory Rate: 18 breaths/min, Blood Pressure: 133/86 mmHg. Cardiovascular 2+ dorsalis pedis/posterior tibialis pulses. Psychiatric pleasant and cooperative. General Notes: Left lower extremity: T the posterior aspect there is epithelialization to the previous wound site. Good edema control. Surrounding skin is intact. o Integumentary (Hair, Skin) Wound #1 status is Healed - Epithelialized. Original cause of wound was Gradually Appeared. The date acquired was: 06/10/2021. The wound has been in treatment 1 weeks. The wound is located on the Left,Posterior Lower Leg. The wound measures 0cm length x 0cm width x 0cm depth; 0cm^2 area and 0cm^3 volume. There is no tunneling or undermining noted. There is a medium amount of serous drainage noted. The wound margin is flat and intact. There is no granulation within the wound bed. There is no necrotic tissue within the wound bed. 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 His wound is healed. He has compression stockings in office today. I recommended continuing to use these daily. He may follow-up as needed. Plan Discharge From [facility]: Discharge from [place] - Call if any future wound care needs. Edema Control - Lymphedema / SCD / Other: Elevate legs to the level of the heart or above for 30 minutes daily and/or when sitting, a frequency of: - throughout the day. Avoid standing for long periods of time. Exercise regularly Moisturize legs daily. - Apply to both legs at night. Compression stocking or Garment 20-30 mm/Hg pressure to: - Apply in the morning and remove at night. 1. Compression stockings daily 2. Discharge from wound center due to closed wound 3. Follow-up as  needed Electronic Signature(s) Signed: 08/30/2021 12:31:58 PM By: [name] DO Entered By: [name] on 08/30/2021 12:29:50 -------------------------------------------------------------------------------- HxROS Details Patient Name: Date of Service: [name], [name] 08/30/2021 11:00 A M Medical Record Number: [id number] Patient Account Number: [id number] Date of Birth/Sex: Treating RN: 12-24-1945 (75 y.o. Marcheta Grammes Primary Care Provider: Agustina Caroli Other Clinician: Referring Provider: Treating Provider/Extender: Burnell Blanks in Treatment: 1 Information Obtained From Patient Respiratory Medical History: Past Medical History Notes: Hx Pulmonary Embolism Cardiovascular Medical History: Positive for: Coronary Artery Disease - stents x 3; Hypertension Past Medical History Notes: Hypertension Musculoskeletal Medical History: Positive for: Osteoarthritis Oncologic Medical History: Past Medical History Notes: Melanoma on back 2018 Immunizations Pneumococcal Vaccine: Received Pneumococcal Vaccination: Yes Received Pneumococcal Vaccination On or After 60th Birthday: Yes Implantable Devices None Family and Social History Unknown History: Yes; Former smoker - quit 50 years ago; Marital Status - Married; Alcohol Use: Rarely; Drug Use: No History; Caffeine Use: Rarely; Financial Concerns: No; Food, Clothing or Shelter Needs: No; Support System Lacking: No; Transportation Concerns: No Electronic Signature(s) Signed: 08/30/2021 12:31:58 PM By: [name] DO Signed: 08/30/2021 4:56:39 PM By: Lorrin Jackson Entered By: [name] on 08/30/2021 12:27:26 -------------------------------------------------------------------------------- SuperBill Details Patient Name: Date of Service: [name], [name] 08/30/2021 Medical Record Number: [id number] Patient Account Number: [id number] Date of Birth/Sex: Treating  RN: Mar 11, 1946 (75 y.o. Hessie Diener Primary Care Provider: Agustina Caroli Other Clinician: Referring Provider: Treating Provider/Extender: Argie Ramming Weeks in Treatment: 1 Diagnosis Coding ICD-10 Codes Code Description [phone] Non-pressure chronic ulcer of other part of left lower leg limited to breakdown of skin I87.312 Chronic venous hypertension (idiopathic) with ulcer of left lower extremity I10 Essential (primary) hypertension Facility Procedures CPT4 Code: [id number] Description: 99213 - WOUND CARE VISIT-LEV 3 EST PT Modifier: Quantity: 1 Physician Procedures : CPT4 Code Description Modifier [id number] - WC PHYS LEVEL 3 - EST PT ICD-10 Diagnosis Description L97.821 Non-pressure chronic ulcer of other part of left lower leg limited to breakdown of skin I87.312 Chronic venous hypertension (idiopathic) with  ulcer of left lower extremity I10 Essential (primary) hypertension Quantity: 1 Electronic Signature(s) Signed: 08/30/2021 12:31:58 PM By: [name] DO Entered By: [name] on 08/30/2021 12:30:03

## 2021-08-31 NOTE — Progress Notes (Signed)
[name], [name] ([id number]) Visit Report for 08/30/2021 Arrival Information Details Patient Name: Date of Service: [name], [name] 08/30/2021 11:00 A M Medical Record Number: [id number] Patient Account Number: [id number] Date of Birth/Sex: Treating RN: November 15, 1945 (75 y.o. Marcheta Grammes Primary Care Lakyia Behe: Agustina Caroli Other Clinician: Referring Lenix Kidd: Treating Nayleen Janosik/Extender: Burnell Blanks in Treatment: 1 Visit Information History Since Last Visit Added or deleted any medications: No Patient Arrived: Crutches Any new allergies or adverse reactions: No Arrival Time: 10:59 Had a fall or experienced change in No Accompanied By: spouse activities of daily living that may affect Transfer Assistance: None risk of falls: Patient Identification Verified: Yes Signs or symptoms of abuse/neglect since last visito No Patient Requires Transmission-Based Precautions: No Hospitalized since last visit: No Patient Has Alerts: Yes Implantable device outside of the clinic excluding No Patient Alerts: Patient on Blood Thinner cellular tissue based products placed in the center since last visit: Pain Present Now: No Electronic Signature(s) Signed: 08/31/2021 9:52:13 AM By: Dellie Catholic RN Entered By: Dellie Catholic on 08/30/2021 11:03:33 -------------------------------------------------------------------------------- Clinic Level of Care Assessment Details Patient Name: Date of Service: [name], [name] 08/30/2021 11:00 [place] Record Number: [id number] Patient Account Number: [id number] Date of Birth/Sex: Treating RN: June 03, 1946 (75 y.o. Hessie Diener Primary Care Hargun Spurling: Agustina Caroli Other Clinician: Referring Tiffanyann Deroo: Treating Starlett Pehrson/Extender: Argie Ramming Weeks in Treatment: 1 Clinic Level of Care Assessment Items TOOL 4 Quantity Score X- 1 0 Use when only an EandM is performed on FOLLOW-UP  visit ASSESSMENTS - Nursing Assessment / Reassessment X- 1 10 Reassessment of Co-morbidities (includes updates in patient status) X- 1 5 Reassessment of Adherence to Treatment Plan ASSESSMENTS - Wound and Skin A ssessment / Reassessment X - Simple Wound Assessment / Reassessment - one wound 1 5 []  - 0 Complex Wound Assessment / Reassessment - multiple wounds X- 1 10 Dermatologic / Skin Assessment (not related to wound area) ASSESSMENTS - Focused Assessment X- 1 5 Circumferential Edema Measurements - multi extremities []  - 0 Nutritional Assessment / Counseling / Intervention []  - 0 Lower Extremity Assessment (monofilament, tuning fork, pulses) []  - 0 Peripheral Arterial Disease Assessment (using hand held doppler) ASSESSMENTS - Ostomy and/or Continence Assessment and Care []  - 0 Incontinence Assessment and Management []  - 0 Ostomy Care Assessment and Management (repouching, etc.) PROCESS - Coordination of Care X - Simple Patient / Family Education for ongoing care 1 15 []  - 0 Complex (extensive) Patient / Family Education for ongoing care X- 1 10 Staff obtains Programmer, systems, Records, T Results / Process Orders est []  - 0 Staff telephones HHA, Nursing Homes / Clarify orders / etc []  - 0 Routine Transfer to another Facility (non-emergent condition) []  - 0 Routine Hospital Admission (non-emergent condition) []  - 0 New Admissions / Biomedical engineer / Ordering NPWT Apligraf, etc. , []  - 0 Emergency Hospital Admission (emergent condition) X- 1 10 Simple Discharge Coordination []  - 0 Complex (extensive) Discharge Coordination PROCESS - Special Needs []  - 0 Pediatric / Minor Patient Management []  - 0 Isolation Patient Management []  - 0 Hearing / Language / Visual special needs []  - 0 Assessment of Community assistance (transportation, D/C planning, etc.) []  - 0 Additional assistance / Altered mentation []  - 0 Support Surface(s) Assessment (bed, cushion, seat,  etc.) INTERVENTIONS - Wound Cleansing / Measurement X - Simple Wound Cleansing - one wound 1 5 []  - 0 Complex Wound Cleansing - multiple wounds X- 1 5 Wound Imaging (photographs - any  number of wounds) []  - 0 Wound Tracing (instead of photographs) X- 1 5 Simple Wound Measurement - one wound []  - 0 Complex Wound Measurement - multiple wounds INTERVENTIONS - Wound Dressings []  - 0 Small Wound Dressing one or multiple wounds []  - 0 Medium Wound Dressing one or multiple wounds []  - 0 Large Wound Dressing one or multiple wounds []  - 0 Application of Medications - topical []  - 0 Application of Medications - injection INTERVENTIONS - Miscellaneous []  - 0 External ear exam []  - 0 Specimen Collection (cultures, biopsies, blood, body fluids, etc.) []  - 0 Specimen(s) / Culture(s) sent or taken to Lab for analysis []  - 0 Patient Transfer (multiple staff / Civil Service fast streamer / Similar devices) []  - 0 Simple Staple / Suture removal (25 or less) []  - 0 Complex Staple / Suture removal (26 or more) []  - 0 Hypo / Hyperglycemic Management (close monitor of Blood Glucose) []  - 0 Ankle / Brachial Index (ABI) - do not check if billed separately X- 1 5 Vital Signs Has the patient been seen at the hospital within the last three years: Yes Total Score: 90 Level Of Care: New/Established - Level 3 Electronic Signature(s) Signed: 08/30/2021 4:58:35 PM By: Deon Pilling RN, BSN Entered By: Deon Pilling on 08/30/2021 11:24:27 -------------------------------------------------------------------------------- Encounter Discharge Information Details Patient Name: Date of Service: [name], [name] 08/30/2021 11:00 A M Medical Record Number: [id number] Patient Account Number: [id number] Date of Birth/Sex: Treating RN: 07-31-1946 (75 y.o. Hessie Diener Primary Care Thena Devora: Agustina Caroli Other Clinician: Referring Baylen Dea: Treating Milka Windholz/Extender: Burnell Blanks in  Treatment: 1 Encounter Discharge Information Items Discharge Condition: Stable Ambulatory Status: Crutches Discharge Destination: Home Transportation: Private Auto Accompanied By: wife Schedule Follow-up Appointment: No Clinical Summary of Care: Notes stocking to left leg. Electronic Signature(s) Signed: 08/30/2021 4:58:35 PM By: Deon Pilling RN, BSN Entered By: Deon Pilling on 08/30/2021 11:25:06 -------------------------------------------------------------------------------- Lower Extremity Assessment Details Patient Name: Date of Service: [name], [name] 08/30/2021 11:00 A M Medical Record Number: [id number] Patient Account Number: [id number] Date of Birth/Sex: Treating RN: 10/10/1946 (75 y.o. Marcheta Grammes Primary Care Azaylah Stailey: Agustina Caroli Other Clinician: Referring Tyshana Nishida: Treating Lilyauna Miedema/Extender: Argie Ramming Weeks in Treatment: 1 Edema Assessment Assessed: [Left: No] [Right: No] Edema: [Left: Ye] [Right: s] Calf Left: Right: Point of Measurement: 34 cm From Medial Instep 45 cm Ankle Left: Right: Point of Measurement: 12 cm From Medial Instep 27 cm Electronic Signature(s) Signed: 08/30/2021 4:56:39 PM By: Lorrin Jackson Signed: 08/31/2021 9:52:13 AM By: Dellie Catholic RN Entered By: Dellie Catholic on 08/30/2021 11:01:35 -------------------------------------------------------------------------------- Multi Wound Chart Details Patient Name: Date of Service: [name], [name] 08/30/2021 11:00 A M Medical Record Number: [id number] Patient Account Number: [id number] Date of Birth/Sex: Treating RN: October 20, 1945 (75 y.o. Marcheta Grammes Primary Care Dauna Ziska: Agustina Caroli Other Clinician: Referring Arfa Lamarca: Treating Shivaun Bilello/Extender: Argie Ramming Weeks in Treatment: 1 Vital Signs Height(in): 73 Pulse(bpm): 48 Weight(lbs): 315 Blood Pressure(mmHg): 133/86 Body Mass Index(BMI): 42 Temperature(F):  98 Respiratory Rate(breaths/min): 18 Photos: [N/A:N/A] Left, Posterior Lower Leg N/A N/A Wound Location: Gradually Appeared N/A N/A Wounding Event: Venous Leg Ulcer N/A N/A Primary Etiology: Coronary Artery Disease, N/A N/A Comorbid History: Hypertension, Osteoarthritis 06/10/2021 N/A N/A Date Acquired: 1 N/A N/A Weeks of Treatment: Healed - Epithelialized N/A N/A Wound Status: 0x0x0 N/A N/A Measurements L x W x D (cm) 0 N/A N/A A (cm) : rea 0 N/A N/A Volume (cm) : 100.00% N/A N/A % Reduction in Area:  100.00% N/A N/A % Reduction in Volume: Full Thickness Without Exposed N/A N/A Classification: Support Structures Medium N/A N/A Exudate Amount: Serous N/A N/A Exudate Type: amber N/A N/A Exudate Color: Flat and Intact N/A N/A Wound Margin: None Present (0%) N/A N/A Granulation Amount: None Present (0%) N/A N/A Necrotic Amount: Fascia: No N/A N/A Exposed Structures: Fat Layer (Subcutaneous Tissue): No Tendon: No Muscle: No Joint: No Bone: No Large (67-100%) N/A N/A Epithelialization: Treatment Notes Electronic Signature(s) Signed: 08/30/2021 12:31:58 PM By: [name] DO Signed: 08/30/2021 4:56:39 PM By: Lorrin Jackson Entered By: [name] on 08/30/2021 12:26:45 -------------------------------------------------------------------------------- Multi-Disciplinary Care Plan Details Patient Name: Date of Service: [name], [name] 08/30/2021 11:00 A M Medical Record Number: [id number] Patient Account Number: [id number] Date of Birth/Sex: Treating RN: 1945-11-04 (75 y.o. Hessie Diener Primary Care Heiress Williamson: Agustina Caroli Other Clinician: Referring Eilish Mcdaniel: Treating Eliora Nienhuis/Extender: Argie Ramming Weeks in Treatment: 1 [place] reviewed with physician Active Inactive Electronic Signature(s) Signed: 08/30/2021 4:58:35 PM By: Deon Pilling RN, BSN Entered By: Deon Pilling on 08/30/2021  11:23:44 -------------------------------------------------------------------------------- Pain Assessment Details Patient Name: Date of Service: [name], [name] 08/30/2021 11:00 A M Medical Record Number: [id number] Patient Account Number: [id number] Date of Birth/Sex: Treating RN: October 16, 1945 (75 y.o. Marcheta Grammes Primary Care Jahsir Rama: Agustina Caroli Other Clinician: Referring Adya Wirz: Treating Margrete Delude/Extender: Argie Ramming Weeks in Treatment: 1 Active Problems Location of Pain Severity and Description of Pain Patient Has Paino No Site Locations Pain Management and Medication Current Pain Management: Electronic Signature(s) Signed: 08/30/2021 4:56:39 PM By: Lorrin Jackson Signed: 08/31/2021 9:52:13 AM By: Dellie Catholic RN Entered By: Dellie Catholic on 08/30/2021 11:01:28 -------------------------------------------------------------------------------- Patient/Caregiver Education Details Patient Name: Date of Service: BAYLEE, MCCORKEL Rich 11/21/2022andnbsp11:00 A M Medical Record Number: [id number] Patient Account Number: [id number] Date of Birth/Gender: Treating RN: 09/10/1946 (75 y.o. Hessie Diener Primary Care Physician: Agustina Caroli Other Clinician: Referring Physician: Treating Physician/Extender: Burnell Blanks in Treatment: 1 Education Assessment Education Provided To: Patient and Caregiver Education Topics Provided Wound/Skin Impairment: Handouts: Skin Care Do's and Dont's Methods: Explain/Verbal Responses: Reinforcements needed Electronic Signature(s) Signed: 08/30/2021 4:58:35 PM By: Deon Pilling RN, BSN Entered By: Deon Pilling on 08/30/2021 11:23:58 -------------------------------------------------------------------------------- Wound Assessment Details Patient Name: Date of Service: [name], [name] 08/30/2021 11:00 A M Medical Record Number: [id number] Patient Account Number:  [id number] Date of Birth/Sex: Treating RN: July 03, 1946 (75 y.o. Marcheta Grammes Primary Care Trinton Prewitt: Agustina Caroli Other Clinician: Referring Hermie Reagor: Treating Chamaine Stankus/Extender: Argie Ramming Weeks in Treatment: 1 Wound Status Wound Number: 1 Primary Etiology: Venous Leg Ulcer Wound Location: Left, Posterior Lower Leg Wound Status: Healed - Epithelialized Wounding Event: Gradually Appeared Comorbid History: Coronary Artery Disease, Hypertension, Osteoarthritis Date Acquired: 06/10/2021 Weeks Of Treatment: 1 Clustered Wound: No Photos Wound Measurements Length: (cm) Width: (cm) Depth: (cm) Area: (cm) Volume: (cm) 0 % Reduction in Area: 100% 0 % Reduction in Volume: 100% 0 Epithelialization: Large (67-100%) 0 Tunneling: No 0 Undermining: No Wound Description Classification: Full Thickness Without Exposed Support Structures Wound Margin: Flat and Intact Exudate Amount: Medium Exudate Type: Serous Exudate Color: amber Foul Odor After Cleansing: No Slough/Fibrino No Wound Bed Granulation Amount: None Present (0%) Exposed Structure Necrotic Amount: None Present (0%) Fascia Exposed: No Fat Layer (Subcutaneous Tissue) Exposed: No Tendon Exposed: No Muscle Exposed: No Joint Exposed: No Bone Exposed: No Electronic Signature(s) Signed: 08/30/2021 3:01:27 PM By: Sandre Kitty Signed: 08/30/2021 4:56:39 PM By: Lorrin Jackson Entered By: Sandre Kitty on 08/30/2021 11:03:41 --------------------------------------------------------------------------------  Vitals Details Patient Name: Date of Service: [name], [name] 08/30/2021 11:00 A M Medical Record Number: [id number] Patient Account Number: [id number] Date of Birth/Sex: Treating RN: [birth year] (36 y.o. Marcheta Grammes Primary Care Jacquelynn Friend: Agustina Caroli Other Clinician: Referring Jaquitta Dupriest: Treating Kemaya Dorner/Extender: Argie Ramming Weeks in Treatment:  1 Vital Signs Time Taken: 10:59 Temperature (F): 98 Height (in): 73 Pulse (bpm): 48 Weight (lbs): 315 Respiratory Rate (breaths/min): 18 Body Mass Index (BMI): 41.6 Blood Pressure (mmHg): 133/86 Reference Range: 80 - 120 mg / dl Electronic Signature(s) Signed: 08/31/2021 9:52:13 AM By: Dellie Catholic RN Entered By: Dellie Catholic on 08/30/2021 11:01:17

## 2021-10-08 ENCOUNTER — Ambulatory Visit (INDEPENDENT_AMBULATORY_CARE_PROVIDER_SITE_OTHER): Payer: Medicare Other | Admitting: *Deleted

## 2021-10-08 DIAGNOSIS — I10 Essential (primary) hypertension: Secondary | ICD-10-CM

## 2021-10-08 DIAGNOSIS — E785 Hyperlipidemia, unspecified: Secondary | ICD-10-CM

## 2021-10-08 DIAGNOSIS — S81802A Unspecified open wound, left lower leg, initial encounter: Secondary | ICD-10-CM

## 2021-10-08 NOTE — Patient Instructions (Signed)
Visit Information  [name] and [name], thank you for taking time to talk with me today about [name] healthcare needs. Please don't hesitate to contact me if I can be of assistance to you before our next scheduled telephone appointment.  Below are the goals we discussed today:  Patient Self-Care Activities: Patient [name] will: Take medications as prescribed Attend all scheduled provider appointments Call pharmacy for medication refills Call provider office for new concerns or questions Continue checking blood pressure weekly and write the results down on paper in a dedicated log, calendar or diary- we will review these with each telephone appointment  Continue monitoring/ writing down your weights at home 1- 2 times per week- we will review these with each telephone appointment; today you reported weights at home at 309 lbs Continue your efforts to follow heart healthy, low salt, low cholesterol diet Make small changes to your diet for lasting effect Keep trying to increase your activity- this will help your blood pressure stay in range and will help you lose weight Continue your efforts to prevent falls- use your cane when needed, as you have been doing  Our next scheduled telephone follow up visit/ appointment with care management team member is scheduled on:  Tuesday, March 15, 2022 at 9:00 am  If you need to cancel or re-schedule our visit, please call [phone] and our care guide team will be happy to assist you.   I look forward to hearing about your progress.   Oneta Rack, RN, BSN, [place] [phone]: direct office  If you are experiencing a Mental Health or [place] or need someone to talk to, please  call the Suicide and Crisis Lifeline: 988 call the [place] National Suicide Prevention Lifeline: 4401328401 or TTY: 267-568-6753 TTY 860-507-8133) to talk to a trained counselor call  1-800-273-TALK (toll free, 24 hour hotline) go to [facility] [address], [place] [phone]) call 911   Patient verbalizes understanding of instructions provided today and agrees to view in MyChart   Hypertension, Adult Hypertension is another name for high blood pressure. High blood pressure forces your heart to work harder to pump blood. This can cause problems over time. There are two numbers in a blood pressure reading. There is a top number (systolic) over a bottom number (diastolic). It is best to have a blood pressure that is below 120/80. Healthy choices can help lower your blood pressure, or you may need medicine to help lower it. What are the causes? The cause of this condition is not known. Some conditions may be related to high blood pressure. What increases the risk? Smoking. Having type 2 diabetes mellitus, high cholesterol, or both. Not getting enough exercise or physical activity. Being overweight. Having too much fat, sugar, calories, or salt (sodium) in your diet. Drinking too much alcohol. Having long-term (chronic) kidney disease. Having a family history of high blood pressure. Age. Risk increases with age. Race. You may be at higher risk if you are African American. Gender. Men are at higher risk than women before age 53. After age 43, women are at higher risk than men. Having obstructive sleep apnea. Stress. What are the signs or symptoms? High blood pressure may not cause symptoms. Very high blood pressure (hypertensive crisis) may cause: Headache. Feelings of worry or nervousness (anxiety). Shortness of breath. Nosebleed. A feeling of being sick to your stomach (nausea). Throwing up (vomiting). Changes in how you  see. Very bad chest pain. Seizures. How is this treated? This condition is treated by making healthy lifestyle changes, such as: Eating healthy foods. Exercising more. Drinking less alcohol. Your  health care provider may prescribe medicine if lifestyle changes are not enough to get your blood pressure under control, and if: Your top number is above 130. Your bottom number is above 80. Your personal target blood pressure may vary. Follow these instructions at home: Eating and drinking  If told, follow the DASH eating plan. To follow this plan: Fill one half of your plate at each meal with fruits and vegetables. Fill one fourth of your plate at each meal with whole grains. Whole grains include whole-wheat pasta, brown rice, and whole-grain bread. Eat or drink low-fat dairy products, such as skim milk or low-fat yogurt. Fill one fourth of your plate at each meal with low-fat (lean) proteins. Low-fat proteins include fish, chicken without skin, eggs, beans, and tofu. Avoid fatty meat, cured and processed meat, or chicken with skin. Avoid pre-made or processed food. Eat less than 1,500 mg of salt each day. Do not drink alcohol if: Your doctor tells you not to drink. You are pregnant, may be pregnant, or are planning to become pregnant. If you drink alcohol: Limit how much you use to: 0-1 drink a day for women. 0-2 drinks a day for men. Be aware of how much alcohol is in your drink. In the [place] one drink equals one 12 oz bottle of beer (355 mL), one 5 oz glass of wine (148 mL), or one 1 oz glass of hard liquor (44 mL). Lifestyle  Work with your doctor to stay at a healthy weight or to lose weight. Ask your doctor what the best weight is for you. Get at least 30 minutes of exercise most days of the week. This may include walking, swimming, or biking. Get at least 30 minutes of exercise that strengthens your muscles (resistance exercise) at least 3 days a week. This may include lifting weights or doing Pilates. Do not use any products that contain nicotine or tobacco, such as cigarettes, e-cigarettes, and chewing tobacco. If you need help quitting, ask your doctor. Check your blood  pressure at home as told by your doctor. Keep all follow-up visits as told by your doctor. This is important. Medicines Take over-the-counter and prescription medicines only as told by your doctor. Follow directions carefully. Do not skip doses of blood pressure medicine. The medicine does not work as well if you skip doses. Skipping doses also puts you at risk for problems. Ask your doctor about side effects or reactions to medicines that you should watch for. Contact a doctor if you: Think you are having a reaction to the medicine you are taking. Have headaches that keep coming back (recurring). Feel dizzy. Have swelling in your ankles. Have trouble with your vision. Get help right away if you: Get a very bad headache. Start to feel mixed up (confused). Feel weak or numb. Feel faint. Have very bad pain in your: Chest. Belly (abdomen). Throw up more than once. Have trouble breathing. Summary Hypertension is another name for high blood pressure. High blood pressure forces your heart to work harder to pump blood. For most people, a normal blood pressure is less than 120/80. Making healthy choices can help lower blood pressure. If your blood pressure does not get lower with healthy choices, you may need to take medicine. This information is not intended to replace advice given to you by  your health care provider. Make sure you discuss any questions you have with your health care provider. Document Revised: 06/06/2018 Document Reviewed: 06/06/2018 Elsevier Patient Education  [place].  Cholesterol Content in Foods Cholesterol is a waxy, fat-like substance that helps to carry fat in the blood. The body needs cholesterol in small amounts, but too much cholesterol can cause damage to the arteries and heart. What foods have cholesterol? Cholesterol is found in animal-based foods, such as meat, seafood, and dairy. Generally, low-fat dairy and lean meats have less cholesterol than  full-fat dairy and fatty meats. The milligrams of cholesterol per serving (mg per serving) of common cholesterol-containing foods are listed below. Meats and other proteins Egg -- one large whole egg has 186 mg. Veal shank -- 4 oz (113 g) has 141 mg. Lean ground [place] (93% lean) -- 4 oz (113 g) has 118 mg. Fat-trimmed lamb loin -- 4 oz (113 g) has 106 mg. Lean ground beef (90% lean) -- 4 oz (113 g) has 100 mg. Lobster -- 3.5 oz (99 g) has 90 mg. Pork loin chops -- 4 oz (113 g) has 86 mg. Canned salmon -- 3.5 oz (99 g) has 83 mg. Fat-trimmed beef top loin -- 4 oz (113 g) has 78 mg. Frankfurter -- 1 frank (3.5 oz or 99 g) has 77 mg. Crab -- 3.5 oz (99 g) has 71 mg. Roasted chicken without skin, white meat -- 4 oz (113 g) has 66 mg. Light bologna -- 2 oz (57 g) has 45 mg. Deli-cut [place] -- 2 oz (57 g) has 31 mg. Canned tuna -- 3.5 oz (99 g) has 31 mg. Berniece Salines -- 1 oz (28 g) has 29 mg. Oysters and mussels (raw) -- 3.5 oz (99 g) has 25 mg. Mackerel -- 1 oz (28 g) has 22 mg. Trout -- 1 oz (28 g) has 20 mg. Pork sausage -- 1 link (1 oz or 28 g) has 17 mg. Salmon -- 1 oz (28 g) has 16 mg. Tilapia -- 1 oz (28 g) has 14 mg. Dairy Soft-serve ice cream --  cup (4 oz or 86 g) has 103 mg. Whole-milk yogurt -- 1 cup (8 oz or 245 g) has 29 mg. Cheddar cheese -- 1 oz (28 g) has 28 mg. American cheese -- 1 oz (28 g) has 28 mg. Whole milk -- 1 cup (8 oz or 250 mL) has 23 mg. 2% milk -- 1 cup (8 oz or 250 mL) has 18 mg. Cream cheese -- 1 tablespoon (Tbsp) (14.5 g) has 15 mg. Cottage cheese --  cup (4 oz or 113 g) has 14 mg. Low-fat (1%) milk -- 1 cup (8 oz or 250 mL) has 10 mg. Sour cream -- 1 Tbsp (12 g) has 8.5 mg. Low-fat yogurt -- 1 cup (8 oz or 245 g) has 8 mg. Nonfat Greek yogurt -- 1 cup (8 oz or 228 g) has 7 mg. Half-and-half cream -- 1 Tbsp (15 mL) has 5 mg. Fats and oils Cod liver oil -- 1 tablespoon (Tbsp) (13.6 g) has 82 mg. Butter -- 1 Tbsp (14 g) has 15 mg. Lard -- 1 Tbsp (12.8  g) has 14 mg. Bacon grease -- 1 Tbsp (12.9 g) has 14 mg. Mayonnaise -- 1 Tbsp (13.8 g) has 5-10 mg. Margarine -- 1 Tbsp (14 g) has 3-10 mg. The items listed above may not be a complete list of foods with cholesterol. Exact amounts of cholesterol in these foods may vary depending on specific  ingredients and brands. Contact a dietitian for more information. What foods do not have cholesterol? Most plant-based foods do not have cholesterol unless you combine them with a food that has cholesterol. Foods without cholesterol include: Grains and cereals. Vegetables. Fruits. Vegetable oils, such as olive, canola, and sunflower oil. Legumes, such as peas, beans, and lentils. Nuts and seeds. Egg whites. The items listed above may not be a complete list of foods that do not have cholesterol. Contact a dietitian for more information. Summary The body needs cholesterol in small amounts, but too much cholesterol can cause damage to the arteries and heart. Cholesterol is found in animal-based foods, such as meat, seafood, and dairy. Generally, low-fat dairy and lean meats have less cholesterol than full-fat dairy and fatty meats. This information is not intended to replace advice given to you by your health care provider. Make sure you discuss any questions you have with your health care provider. Document Revised: 02/05/2021 Document Reviewed: 02/05/2021 Elsevier Patient Education  [place].

## 2021-10-08 NOTE — Chronic Care Management (AMB) (Signed)
Chronic Care Management   CCM RN Visit Note  10/08/2021 Name: [name] MRN: [id number] DOB: 1945-11-25  Subjective: [name] is a 75 y.o. year old male who is a primary care patient of Mitchel Honour, Ines Bloomer, MD. The care management team was consulted for assistance with disease management and care coordination needs.    Engaged with patient and his spouse/ caregiver by telephone for follow up visit in response to provider referral for case management and/or care coordination services.   Consent to Services:  The patient was given information about Chronic Care Management services, agreed to services, and gave verbal consent prior to initiation of services.  Please see initial visit note for detailed documentation.  Patient agreed to services and verbal consent obtained.   Assessment: Review of patient past medical history, allergies, medications, health status, including review of consultants reports, laboratory and other test data, was performed as part of comprehensive evaluation and provision of chronic care management services.   SDOH (Social Determinants of Health) assessments and interventions performed:  SDOH Interventions    Flowsheet Row Most Recent Value  SDOH Interventions   Food Insecurity Interventions Intervention Not Indicated  Transportation Interventions Intervention Not Indicated  [Patient continues driving self]      CCM Care Plan No Known Allergies  Outpatient Encounter Medications as of 10/08/2021  Medication Sig Note   acetaminophen (TYLENOL) 325 MG tablet Take 2 tablets (650 mg total) by mouth every 6 (six) hours as needed for mild pain (or Fever >/= 101).    ALPRAZolam (XANAX) 0.5 MG tablet Take 1 tablet (0.5 mg total) by mouth daily as needed for anxiety.    Docusate Sodium (COLACE PO) Take by mouth daily.    furosemide (LASIX) 40 MG tablet Take 1 tablet (40 mg total) by mouth daily. Take 1/2 tab for 5 days before taking whole tab.     lisinopril (ZESTRIL) 10 MG tablet Take 1 tablet (10 mg total) by mouth daily.    pantoprazole (PROTONIX) 40 MG tablet Take 1 tablet (40 mg total) by mouth daily.    rivaroxaban (XARELTO) 20 MG TABS tablet Take 1 tablet (20 mg total) by mouth daily with supper.    rosuvastatin (CRESTOR) 20 MG tablet Take 1 tablet (20 mg total) by mouth daily.    Sennosides (SENOKOT PO) Take by mouth daily. 10/28/2020: Senokot D   No facility-administered encounter medications on file as of 10/08/2021.   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   Generalized anxiety disorder 08/31/2016   History of colonic polyps 08/31/2016   Conditions to be addressed/monitored:  HTN and HLD  Care Plan : Hypertension (Adult)  Updates made by Knox Royalty, RN since 10/08/2021 12:00 AM     Problem: Hypertension (Hypertension)   Priority: Medium     Long-Range Goal: Hypertension Monitored   Start Date: 03/02/2021  Expected End Date: 03/02/2022  Recent Progress: On track  Priority: Medium  Note:   Case Manager Clinical Goal(s):  03/02/21: Over the next 12 months, patient will  demonstrate ongoing adherence to prescribed treatment plan for hypertension as evidenced by patient/ spouse reporting during CCM RN CM outreach, of: Taking all medications as prescribed Monitoring and recording weekly blood pressure and weights at home as directed Adhering to low sodium/DASH diet Weight loss- reported weight 03/02/21: 311 lbs; reported weight ranges 04/13/21:  300-305 ; reports increase in weight 07/12/21-- 310 lbs- wife has been recuperating from foot suregry- so patient has assumed cooking responsibilities temporarily; continues to focus on attempts to lose weight: strategies reiterated today Interventions:  Collaboration with Horald Pollen, MD regarding development and update of comprehensive plan of care as evidenced by provider attestation and co-signature Inter-disciplinary care team collaboration (see longitudinal plan of care) Chart reviewed including relevant office notes, upcoming scheduled appointments, and lab results  10/08/21- Goal completed due to duplication of goals, progression of care plan with conversion to new format; goals re-established and extended today     Care Plan : [place] of Care  Updates made by Knox Royalty, RN since 10/08/2021 12:00 AM     Problem: Chronic Disease Management Needs   Priority: High     Long-Range Goal: Ongoing adherence to established plan of care for long term chronic disease management   Start Date: 10/08/2021  Expected End Date: 10/08/2022  Priority: High  Note:   Current Barriers:  Chronic Disease Management support and education needs related to HTN and HLD Decreased motivation to increase activity, difficulty managing dietary portions, losing weight- continues to benefit from ongoing reinforcement and support HOH- wife assists in management of care communications/ medications- as/ if needed  RNCM Clinical Goal(s):  Patient will demonstrate ongoing health management independence as evidenced by HTN; HLD        through collaboration with RN Care manager, provider, and care team.   Interventions: 1:1 collaboration with primary care provider regarding development and update of comprehensive plan of care as evidenced by provider attestation and co-signature Inter-disciplinary care team collaboration (see longitudinal plan of care) Evaluation of current treatment  plan related to  self management and patient's adherence to plan as established by provider SDOH updated- no new concerns identified Falls assessment completed: no new/ recent falls; uses cane as indicated/ needed for fall prevention; positive reinforcement provided with encouragement to continue efforts Pain Assessment: denies pain today, reports ongoing "slight" chronic pain occasionally in knees due to "arthritis" Discussed current clinical condition: reports new onset this morning of signs/ symptoms "bad head cold;" states he feels "congested in my head and a little achy;" reports his daughter/ grandchildren had colds over Christmas, wonders if he picked up from family members; denies fever, positive for "slight stomach upset," states he is "sure this is not COVID;" has not done home COVID test; we discussed options for treatment should symptoms progress/ worsen, including seeking care at Urgent Care Center/ ED, given holiday office closure; we also discussed option of trying OTC medications- I encourage him to take his current medication list to outpatient pharmacy to obtain pharmacy direction/ advice on best OTC medications to take, should he opt to start taking OTC medications; confirmed patient does not feel short of breath or have signs/ symptoms breathing difficulty Confirmed patient obtained flu vaccine for 2022-23 flu/ winter season, 08/18/21 at time of PCP office visit  Hyperlipidemia:  (Status: Goal on Track (progressing): YES.) Long Term Goal  Lab Results  Component Value Date   CHOL 125 11/09/2020   HDL 49 11/09/2020   LDLCALC 56 11/09/2020  TRIG 113 11/09/2020   CHOLHDL 2.6 11/09/2020    Confirmed no recent/ new medication changes/ concerns: reports continues taking all medications as prescribed  Provider established cholesterol goals reviewed; Counseled on importance of regular laboratory monitoring as prescribed; Reviewed importance of limiting foods high in  cholesterol; Reviewed exercise goals and target of 150 minutes per week;  Hypertension: (Status: Goal on Track (progressing): YES.) Long Term Goal  Last practice recorded BP readings:  BP Readings from Last 3 Encounters:  08/18/21 120/70  02/18/21 124/62  11/09/20 118/70  Most recent eGFR/CrCl: No results found for: EGFR  No components found for: CRCL  Evaluation of current treatment plan related to hypertension self management and patient's adherence to plan as established by provider;   Reviewed prescribed diet heart healthy, low salt, low cholesterol Counseled on the importance of exercise goals with target of 150 minutes per week Discussed plans with patient for ongoing care management follow up and provided patient with direct contact information for care management team; Discussed complications of poorly controlled blood pressure such as heart disease, stroke, circulatory complications, vision complications, kidney impairment, sexual dysfunction;  Screening for signs and symptoms of depression related to chronic disease state;  Assessed social determinant of health barriers;  Reviewed last PCP office visit 08/18/21 with patient and caregiver/ spouse- confirmed patient's (L) leg wound has now completely resolved; continues wearing compression stockings, monitoring leg status; confirmed took antibiotics as prescribed and attended wound center visits- no additional wound center visits needed Confirmed patient continues monitoring/ writing down on paper daily weights/ weekly blood pressures- reviewed today Reports recent weights at home as: 309-310 lbs- this is baseline for patient Reports blood pressures consistently between 116-138/58-88; reports blood pressure today of "130/80" Confirmed patient continues efforts to adhere to prescribed diet: reinforced previously provided strategies for adherence; positive reinforcement provided with encouragement to continue efforts Reviewed upcoming  scheduled provider office visits: Feb 16, 2022- PCP  Patient Goals/Self-Care Activities: As evidenced by review of EHR, collaboration with care team, and patient reporting during CCM RN CM outreach,  Patient [name] will: Take medications as prescribed Attend all scheduled provider appointments Call pharmacy for medication refills Call provider office for new concerns or questions Continue checking blood pressure weekly and write the results down on paper in a dedicated log, calendar or diary- we will review these with each telephone appointment  Continue monitoring/ writing down your weights at home 1- 2 times per week- we will review these with each telephone appointment; today you reported weights at home at 309 lbs Continue your efforts to follow heart healthy, low salt, low cholesterol diet Make small changes to your diet for lasting effect Keep trying to increase your activity- this will help your blood pressure stay in range and will help you lose weight Continue your efforts to prevent falls- use your cane when needed, as you have been doing    Plan: Telephone follow up appointment with care management team member scheduled for:  Tuesday, March 15, 2022 at 9:00 am The patient has been provided with contact information for the care management team and has been advised to call with any health related questions or concerns  Oneta Rack, RN, BSN, [place] [phone]: direct office

## 2021-10-09 DIAGNOSIS — E785 Hyperlipidemia, unspecified: Secondary | ICD-10-CM | POA: Diagnosis not present

## 2021-10-09 DIAGNOSIS — I10 Essential (primary) hypertension: Secondary | ICD-10-CM

## 2022-01-10 ENCOUNTER — Encounter (HOSPITAL_BASED_OUTPATIENT_CLINIC_OR_DEPARTMENT_OTHER): Payer: Medicare Other | Attending: General Surgery | Admitting: General Surgery

## 2022-01-10 DIAGNOSIS — L97812 Non-pressure chronic ulcer of other part of right lower leg with fat layer exposed: Secondary | ICD-10-CM | POA: Diagnosis not present

## 2022-01-10 DIAGNOSIS — E785 Hyperlipidemia, unspecified: Secondary | ICD-10-CM | POA: Insufficient documentation

## 2022-01-10 DIAGNOSIS — L97212 Non-pressure chronic ulcer of right calf with fat layer exposed: Secondary | ICD-10-CM | POA: Diagnosis not present

## 2022-01-10 DIAGNOSIS — I87311 Chronic venous hypertension (idiopathic) with ulcer of right lower extremity: Secondary | ICD-10-CM | POA: Diagnosis not present

## 2022-01-10 DIAGNOSIS — L97211 Non-pressure chronic ulcer of right calf limited to breakdown of skin: Secondary | ICD-10-CM | POA: Insufficient documentation

## 2022-01-10 DIAGNOSIS — I87303 Chronic venous hypertension (idiopathic) without complications of bilateral lower extremity: Secondary | ICD-10-CM | POA: Diagnosis not present

## 2022-01-10 DIAGNOSIS — I10 Essential (primary) hypertension: Secondary | ICD-10-CM | POA: Diagnosis not present

## 2022-01-10 DIAGNOSIS — I2510 Atherosclerotic heart disease of native coronary artery without angina pectoris: Secondary | ICD-10-CM | POA: Diagnosis not present

## 2022-01-10 NOTE — Progress Notes (Addendum)
[name] ([id number]) ?Visit Report for 01/10/2022 ?Chief Complaint Document Details ?Patient Name: Date of Service: ?[name] 01/10/2022 1:30 PM ?Medical Record Number: [id number] ?Patient Account Number: [id number] ?Date of Birth/Sex: Treating RN: ?1945-11-23 (76 y.o. M) ?Primary Care Provider: Agustina Caroli Other Clinician: ?Referring Provider: ?Treating Provider/Extender: Fredirick Maudlin ?Agustina Caroli ?Weeks in Treatment: 0 ?Information Obtained from: Patient ?Chief Complaint ?Right lower extremity wound ?Electronic Signature(s) ?Signed: 01/10/2022 2:43:21 PM By: Fredirick Maudlin MD FACS ?Entered By: Fredirick Maudlin on 01/10/2022 14:43:21 ?-------------------------------------------------------------------------------- ?HPI Details ?Patient Name: Date of Service: ?[name] 01/10/2022 1:30 PM ?Medical Record Number: [id number] ?Patient Account Number: [id number] ?Date of Birth/Sex: Treating RN: ?06-23-1946 (76 y.o. M) ?Primary Care Provider: Agustina Caroli Other Clinician: ?Referring Provider: ?Treating Provider/Extender: Fredirick Maudlin ?Agustina Caroli ?Weeks in Treatment: 0 ?History of Present Illness ?HPI Description: Admission 08/23/2021 ?Mr. [name] is a 76 year old male with a past medical history of essential hypertension, coronary artery disease and hyperlipidemia that presents to the ?clinic for a wound to his left lower extremity. He states that 2 months ago he scratched the back of his leg creating a small wound that has not healed. He ?states it drains serous fluid constantly. He has been using Neosporin to the area and keeping it covered. He recently saw his primary care physician and was ?recommended compression stockings and an antibiotic. Patient reports improvement in redness with the use of Duricef. He has been wearing his compression ?stockings for the past week. He is also on a diuretic for his hypertension. He denies signs of infection. ?4/21; patient tolerated  the compression wrap well. He has no issues or complaints today. ?READMISSION ?01/10/2022 ?Mr. [name] is back with a new venous ulcer on the right calf. He healed fairly quickly from his last left sided wound and says that he has been wearing ?compression stockings, albeit intermittently. He thinks they are 15 to 20 mmHg pressure. Since his last wound care visit, the left-sided wound reopened, but he ?was able to get it closed on his own just using topical Neosporin. He attempted to do the same on the right, but it simply has persisted and his leg is fairly ?swollen and erythematous today. The wounds themselves are scattered and weeping serous fluid, consistent with venous stasis ulcers. ?Electronic Signature(s) ?Signed: 01/10/2022 2:45:08 PM By: Fredirick Maudlin MD FACS ?Entered By: Fredirick Maudlin on 01/10/2022 14:45:07 ?-------------------------------------------------------------------------------- ?Physical Exam Details ?Patient Name: Date of Service: ?[name] 01/10/2022 1:30 PM ?Medical Record Number: [id number] ?Patient Account Number: [id number] ?Date of Birth/Sex: Treating RN: ?September 14, 1946 (76 y.o. M) ?Primary Care Provider: Agustina Caroli Other Clinician: ?Referring Provider: ?Treating Provider/Extender: Fredirick Maudlin ?Agustina Caroli ?Weeks in Treatment: 0 ?Constitutional ?. . . . No acute distress. ?Respiratory ?Normal work of breathing on room air.Marland Kitchen ?Cardiovascular ?Both feet are warm, but I do not appreciate easily palpable pulses; he has erythema and swelling on the right.Marland Kitchen ?Notes ?01/10/2022: Wound examthe right lower extremity is erythematous and there are multiple small weeping ulcers on the posterior calf. All drainage is serous. ?Electronic Signature(s) ?Signed: 01/10/2022 2:47:03 PM By: Fredirick Maudlin MD FACS ?Entered By: Fredirick Maudlin on 01/10/2022 14:47:03 ?-------------------------------------------------------------------------------- ?Physician Orders Details ?Patient Name: Date of  Service: ?[name] 01/10/2022 1:30 PM ?Medical Record Number: [id number] ?Patient Account Number: [id number] ?Date of Birth/Sex: Treating RN: ?09-Nov-1945 (76 y.o. M) ?Primary Care Provider: Agustina Caroli Other Clinician: ?Referring Provider: ?Treating Provider/Extender: Fredirick Maudlin ?Agustina Caroli ?Weeks in Treatment: 0 ?Verbal / Phone Orders: No ?Diagnosis Coding ?ICD-10 Coding ?Code Description ?  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 - Dr. Celine Ahr - Room 2 ?Return A ?Bathing/ Shower/ Hygiene ?May shower with protection but do not get wound dressing(s) wet. - Ok to use Biomedical engineer, can purchase at [facility], [facility], or [facility] ?Edema Control - Lymphedema / SCD / Other ?Elevate legs to the level of the heart or above for 30 minutes daily and/or when sitting, a frequency of: - throughout the day ?Avoid standing for long periods of time. ?Moisturize legs daily. - left leg daily ?Compression stocking or Garment 20-30 mm/Hg pressure to: - left leg daily ?Wound Treatment ?Wound #2 - Lower Leg Wound Laterality: Right, Posterior ?Cleanser: Soap and Water 1 x Per Week ?Discharge Instructions: May shower and wash wound with dial antibacterial soap and water prior to dressing change. ?Cleanser: Wound Cleanser 1 x Per Week ?Discharge Instructions: Cleanse the wound with wound cleanser prior to applying a clean dressing using gauze sponges, not tissue or cotton balls. ?Peri-Wound Care: Triamcinolone 15 (g) 1 x Per Week ?Discharge Instructions: Use triamcinolone 15 (g) as directed ?Peri-Wound Care: Sween Lotion (Moisturizing lotion) 1 x Per Week ?Discharge Instructions: Apply moisturizing lotion as  directed ?Prim Dressing: KerraCel Ag Gelling Fiber Dressing, 4x5 in (silver alginate) 1 x Per Week ?ary ?Discharge Instructions: Apply silver alginate to wound bed as instructed ?Secondary Dressing: ABD Pad, 8x10 1 x Per Week ?Discharge Instructions: Apply over primary dressing as directed. ?Secondary Dressing: Zetuvit Plus 4x8 in 1 x Per Week ?Discharge Instructions: Apply over primary dressing as directed. ?Compression Wrap: ThreePress (3 layer compression wrap) 1 x Per Week ?Discharge Instructions: Apply three layer compression as directed. ?Wound #3 - Lower Leg Wound Laterality: Right, Lateral, Proximal ?Cleanser: Soap and Water 1 x Per Week ?Discharge Instructions: May shower and wash wound with dial antibacterial soap and water prior to dressing change. ?Cleanser: Wound Cleanser 1 x Per Week ?Discharge Instructions: Cleanse the wound with wound cleanser prior to applying a clean dressing using gauze sponges, not tissue or cotton balls. ?Peri-Wound Care: Triamcinolone 15 (g) 1 x Per Week ?Discharge Instructions: Use triamcinolone 15 (g) as directed ?Peri-Wound Care: Sween Lotion (Moisturizing lotion) 1 x Per Week ?Discharge Instructions: Apply moisturizing lotion as directed ?Prim Dressing: KerraCel Ag Gelling Fiber Dressing, 4x5 in (silver alginate) 1 x Per Week ?ary ?Discharge Instructions: Apply silver alginate to wound bed as instructed ?Secondary Dressing: ABD Pad, 8x10 1 x Per Week ?Discharge Instructions: Apply over primary dressing as directed. ?Secondary Dressing: Zetuvit Plus 4x8 in 1 x Per Week ?Discharge Instructions: Apply over primary dressing as directed. ?Compression Wrap: ThreePress (3 layer compression wrap) 1 x Per Week ?Discharge Instructions: Apply three layer compression as directed. ?Wound #4 - Lower Leg Wound Laterality: Right, Lateral, Distal ?Cleanser: Soap and Water 1 x Per Week ?Discharge Instructions: May shower and wash wound with dial antibacterial soap and water prior to dressing  change. ?Cleanser: Wound Cleanser 1 x Per Week ?Discharge Instructions: Cleanse the wound with wound cleanser prior to applying a clean dressing using gauze sponges, not tissue or cotton balls. ?Peri-Wound Care: Tr

## 2022-01-10 NOTE — Progress Notes (Addendum)
GEDEON, BRANDOW ([id number]) ?Visit Report for 01/10/2022 ?Allergy List Details ?Patient Name: Date of Service: ?JOWAN, SKILLIN RLES 01/10/2022 1:30 PM ?Medical Record Number: [id number] ?Patient Account Number: [id number] ?Date of Birth/Sex: Treating RN: ?[birth year] (76 y.o. M) ?Primary Care Blakeleigh Domek: Agustina Caroli Other Clinician: ?Referring Benedetta Sundstrom: ?Treating Ashten Sarnowski/Extender: Fredirick Maudlin ?Agustina Caroli ?Weeks in Treatment: 0 ?Allergies ?Active Allergies ?No Known Drug Allergies ?Allergy Notes ?Electronic Signature(s) ?Signed: 01/10/2022 4:05:12 PM By: Adline Peals ?Entered By: Adline Peals on 01/10/2022 13:41:12 ?-------------------------------------------------------------------------------- ?Arrival Information Details ?Patient Name: Date of Service: ?KIYAN, BURMESTER RLES 01/10/2022 1:30 PM ?Medical Record Number: [id number] ?Patient Account Number: [id number] ?Date of Birth/Sex: Treating RN: ?May 15, 1946 (76 y.o. M) ?Primary Care [employer] Mcjunkins: Agustina Caroli Other Clinician: ?Referring Paulmichael Schreck: ?Treating Timberlynn Kizziah/Extender: Fredirick Maudlin ?Agustina Caroli ?Weeks in Treatment: 0 ?Visit Information ?Patient Arrived: Kasandra Knudsen ?Arrival Time: 13:34 ?Accompanied By: wife ?Transfer Assistance: None ?Patient Identification Verified: Yes ?Secondary Verification Process Completed: Yes ?Patient Requires Transmission-Based Precautions: No ?Patient Has Alerts: Yes ?Patient Alerts: Patient on Blood Thinner ?Xarelto ?History Since Last Visit ?Added or deleted any medications: No ?Any new allergies or adverse reactions: No ?Had a fall or experienced change in activities of daily living that may affect risk of falls: No ?Signs or symptoms of abuse/neglect since last visito No ?Hospitalized since last visit: No ?Implantable device outside of the clinic excluding cellular tissue based products placed in the center since last visit: No ?Has Dressing in Place as Prescribed: Yes ?Electronic Signature(s) ?Signed: 01/10/2022 4:05:12 PM  By: Adline Peals ?Entered By: Adline Peals on 01/10/2022 13:38:22 ?-------------------------------------------------------------------------------- ?Clinic Level of Care Assessment Details ?Patient Name: Date of Service: ?LORA, GLOMSKI RLES 01/10/2022 1:30 PM ?Medical Record Number: [id number] ?Patient Account Number: [id number] ?Date of Birth/Sex: Treating RN: ?02-Apr-1946 (76 y.o. [name] ?Primary Care Andrew Blasius: Agustina Caroli Other Clinician: ?Referring Cecil Bixby: ?Treating Raisha Brabender/Extender: Fredirick Maudlin ?Agustina Caroli ?Weeks in Treatment: 0 ?Clinic Level of Care Assessment Items ?TOOL 1 Quantity Score ?X- 1 0 ?Use when EandM and Procedure is performed on INITIAL visit ?ASSESSMENTS - Nursing Assessment / Reassessment ?X- 1 20 ?General Physical Exam (combine w/ comprehensive assessment (listed just below) when performed on new pt. evals) ?X- 1 25 ?Comprehensive Assessment (HX, ROS, Risk Assessments, Wounds Hx, etc.) ?ASSESSMENTS - Wound and Skin Assessment / Reassessment ?'[]'$  - 0 ?Dermatologic / Skin Assessment (not related to wound area) ?ASSESSMENTS - Ostomy and/or Continence Assessment and Care ?'[]'$  - 0 ?Incontinence Assessment and Management ?'[]'$  - 0 ?Ostomy Care Assessment and Management (repouching, etc.) ?PROCESS - Coordination of Care ?X - Simple Patient / Family Education for ongoing care 1 15 ?'[]'$  - 0 ?Complex (extensive) Patient / Family Education for ongoing care ?X- 1 10 ?Staff obtains Consents, Records, T Results / Process Orders ?est ?'[]'$  - 0 ?Staff telephones HHA, Nursing Homes / Clarify orders / etc ?'[]'$  - 0 ?Routine Transfer to another Facility (non-emergent condition) ?'[]'$  - 0 ?Routine Hospital Admission (non-emergent condition) ?X- 1 15 ?New Admissions / Biomedical engineer / Ordering NPWT Apligraf, etc. ?, ?'[]'$  - 0 ?Emergency Hospital Admission (emergent condition) ?PROCESS - Special Needs ?'[]'$  - 0 ?Pediatric / Minor Patient Management ?'[]'$  - 0 ?Isolation Patient  Management ?'[]'$  - 0 ?Hearing / Language / Visual special needs ?'[]'$  - 0 ?Assessment of Community assistance (transportation, D/C planning, etc.) ?'[]'$  - 0 ?Additional assistance / Altered mentation ?'[]'$  - 0 ?Support Surface(s) Assessment (bed, cushion, seat, etc.) ?INTERVENTIONS - Miscellaneous ?'[]'$  - 0 ?External ear exam ?'[]'$  - 0 ?Patient Transfer (multiple staff / Civil Service fast streamer / Similar devices) ?'[]'$  -  0 ?Simple Staple / Suture removal (25 or less) ?'[]'$  - 0 ?Complex Staple / Suture removal (26 or more) ?'[]'$  - 0 ?Hypo/Hyperglycemic Management (do not check if billed separately) ?X- 1 15 ?Ankle / Brachial Index (ABI) - do not check if billed separately ?Has the patient been seen at the hospital within the last three years: Yes ?Total Score: 100 ?Level Of Care: New/Established - Level 3 ?Electronic Signature(s) ?Signed: 01/11/2022 6:11:07 PM By: Levan Hurst RN, BSN ?Signed: 01/11/2022 6:11:07 PM By: Levan Hurst RN, BSN ?Entered By: Levan Hurst on 01/10/2022 17:45:14 ?-------------------------------------------------------------------------------- ?Compression Therapy Details ?Patient Name: ?Date of Service: ?VANESSA, ALESI RLES 01/10/2022 1:30 PM ?Medical Record Number: [id number] ?Patient Account Number: [id number] ?Date of Birth/Sex: ?Treating RN: ?1946/09/30 (76 y.o. M) ?Primary Care Annaliese Saez: Agustina Caroli ?Other Clinician: ?Referring Lizann Edelman: ?Treating Haziel Molner/Extender: Fredirick Maudlin ?Agustina Caroli ?Weeks in Treatment: 0 ?Compression Therapy Performed for Wound Assessment: Wound #2 Right,Posterior Lower Leg ?Performed By: Clinician , ?Compression Type: Three Layer ?Post Procedure Diagnosis ?Same as Pre-procedure ?Electronic Signature(s) ?Signed: 01/10/2022 4:05:12 PM By: Adline Peals ?Entered By: Adline Peals on 01/10/2022 14:39:39 ?-------------------------------------------------------------------------------- ?Compression Therapy Details ?Patient Name: ?Date of Service: ?KURK, CORNIEL RLES 01/10/2022 1:30  PM ?Medical Record Number: [id number] ?Patient Account Number: [id number] ?Date of Birth/Sex: ?Treating RN: ?October 14, 1945 (76 y.o. M) ?Primary Care Heddy Vidana: Agustina Caroli ?Other Clinician: ?Referring Cyree Chuong: ?Treating Melchor Kirchgessner/Extender: Fredirick Maudlin ?Agustina Caroli ?Weeks in Treatment: 0 ?Compression Therapy Performed for Wound Assessment: Wound #3 Right,Proximal,Lateral Lower Leg ?Performed By: Clinician , ?Compression Type: Three Layer ?Post Procedure Diagnosis ?Same as Pre-procedure ?Electronic Signature(s) ?Signed: 01/10/2022 4:05:12 PM By: Adline Peals ?Entered By: Adline Peals on 01/10/2022 14:39:39 ?-------------------------------------------------------------------------------- ?Compression Therapy Details ?Patient Name: ?Date of Service: ?MONTAY, VANVOORHIS RLES 01/10/2022 1:30 PM ?Medical Record Number: [id number] ?Patient Account Number: [id number] ?Date of Birth/Sex: ?Treating RN: ?03/01/1946 (76 y.o. M) ?Primary Care Demarcus Thielke: Agustina Caroli ?Other Clinician: ?Referring Ketty Bitton: ?Treating Cyani Kallstrom/Extender: Fredirick Maudlin ?Agustina Caroli ?Weeks in Treatment: 0 ?Compression Therapy Performed for Wound Assessment: Wound #4 Right,Distal,Lateral Lower Leg ?Performed By: Clinician , ?Compression Type: Three Layer ?Post Procedure Diagnosis ?Same as Pre-procedure ?Electronic Signature(s) ?Signed: 01/10/2022 4:05:12 PM By: Adline Peals ?Entered By: Adline Peals on 01/10/2022 14:39:39 ?-------------------------------------------------------------------------------- ?Encounter Discharge Information Details ?Patient Name: ?Date of Service: ?ANAND, TEJADA RLES 01/10/2022 1:30 PM ?Medical Record Number: [id number] ?Patient Account Number: [id number] ?Date of Birth/Sex: ?Treating RN: ?Oct 20, 1945 (76 y.o. [name] ?Primary Care Kynzee Devinney: Agustina Caroli ?Other Clinician: ?Referring Tillman Kazmierski: ?Treating Skylarr Liz/Extender: Fredirick Maudlin ?Agustina Caroli ?Weeks in Treatment: 0 ?Encounter  Discharge Information Items ?Discharge Condition: Stable ?Ambulatory Status: Kasandra Knudsen ?Discharge Destination: Home ?Transportation: Private Auto ?Accompanied By: wife ?Schedule Follow-up Appointment: Yes ?Clinical Summary

## 2022-01-10 NOTE — Progress Notes (Signed)
[name], [name] ([id number]) ?Visit Report for 01/10/2022 ?Abuse Risk Screen Details ?Patient Name: Date of Service: ?ARIA, PICKRELL Rich 01/10/2022 1:30 PM ?Medical Record Number: [id number] ?Patient Account Number: [id number] ?Date of Birth/Sex: Treating RN: ?07/11/1946 (76 y.o. M) ?Primary Care Elyana Grabski: Agustina Caroli Other Clinician: ?Referring Joliana Claflin: ?Treating Edmond Ginsberg/Extender: Fredirick Maudlin ?Agustina Caroli ?Weeks in Treatment: 0 ?Abuse Risk Screen Items ?Answer ?ABUSE RISK SCREEN: ?Has anyone close to you tried to hurt or harm you recentlyo No ?Do you feel uncomfortable with anyone in your familyo No ?Has anyone forced you do things that you didnt want to doo No ?Electronic Signature(s) ?Signed: 01/10/2022 4:05:12 PM By: Adline Peals ?Entered By: Adline Peals on 01/10/2022 13:49:37 ?-------------------------------------------------------------------------------- ?Activities of Daily Living Details ?Patient Name: Date of Service: ?Bryan, Rich Rich 01/10/2022 1:30 PM ?Medical Record Number: [id number] ?Patient Account Number: [id number] ?Date of Birth/Sex: Treating RN: ?27-Nov-1945 (76 y.o. M) ?Primary Care Kashay Cavenaugh: Agustina Caroli Other Clinician: ?Referring Jeane Cashatt: ?Treating Ilham Roughton/Extender: Fredirick Maudlin ?Agustina Caroli ?Weeks in Treatment: 0 ?Activities of Daily Living Items ?Answer ?Activities of Daily Living (Please select one for each item) ?[place] ?T Medications ?ake Completely Able ?Use T elephone Completely Able ?Care for Appearance Completely Able ?Use T oilet Completely Able ?Bath / Shower Completely Able ?Dress Self Completely Able ?Feed Self Completely Able ?Walk Completely Able ?Get In / Out Bed Completely Able ?Housework Completely Able ?Prepare Meals Completely Able ?Handle Money Completely Able ?Shop for Self Completely Able ?Electronic Signature(s) ?Signed: 01/10/2022 4:05:12 PM By: Adline Peals ?Entered By: Adline Peals on 01/10/2022  13:50:37 ?-------------------------------------------------------------------------------- ?Education Screening Details ?Patient Name: ?Date of Service: ?[name], [name] 01/10/2022 1:30 PM ?Medical Record Number: [id number] ?Patient Account Number: [id number] ?Date of Birth/Sex: ?Treating RN: ?08-Oct-1946 (77 y.o. M) ?Primary Care Deoni Cosey: Agustina Caroli ?Other Clinician: ?Referring Kenidee Cregan: ?Treating Adron Geisel/Extender: Fredirick Maudlin ?Agustina Caroli ?Weeks in Treatment: 0 ?Learning Preferences/Education Level/Primary Language ?Learning Preference: Explanation, Demonstration, Printed Material ?Highest Education Level: College or Above ?Preferred Language: English ?Cognitive Barrier ?Language Barrier: No ?Translator Needed: No ?Memory Deficit: No ?Emotional Barrier: No ?Cultural/Religious Beliefs Affecting Medical Care: No ?Physical Barrier ?Impaired Vision: No ?Impaired Hearing: Yes Hearing Aid ?Decreased Hand dexterity: No ?Knowledge/Comprehension ?Knowledge Level: High ?Comprehension Level: High ?Ability to understand written instructions: High ?Ability to understand verbal instructions: High ?Motivation ?Anxiety Level: Calm ?Cooperation: Cooperative ?Education Importance: Acknowledges Need ?Interest in Health Problems: Asks Questions ?Perception: Coherent ?Willingness to Engage in Self-Management High ?Activities: ?Readiness to Engage in Self-Management High ?Activities: ?Electronic Signature(s) ?Signed: 01/10/2022 4:05:12 PM By: Adline Peals ?Entered By: Adline Peals on 01/10/2022 13:52:36 ?-------------------------------------------------------------------------------- ?Fall Risk Assessment Details ?Patient Name: ?Date of Service: ?Rich, [name] 01/10/2022 1:30 PM ?Medical Record Number: [id number] ?Patient Account Number: [id number] ?Date of Birth/Sex: ?Treating RN: ?08/27/1946 (76 y.o. M) ?Primary Care Cristofer Yaffe: Agustina Caroli ?Other Clinician: ?Referring Romina Divirgilio: ?Treating Latoya Maulding/Extender:  Fredirick Maudlin ?Agustina Caroli ?Weeks in Treatment: 0 ?Fall Risk Assessment Items ?Have you had 2 or more falls in the last 12 monthso 0 No ?Have you had any fall that resulted in injury in the last 12 monthso 0 No ?FALLS RISK SCREEN ?History of falling - immediate or within 3 months 0 No ?Secondary diagnosis (Do you have 2 or more medical diagnoseso) 0 No ?Ambulatory aid ?None/bed rest/wheelchair/nurse 0 No ?Crutches/cane/walker 15 Yes ?Furniture 0 No ?Intravenous therapy Access/Saline/Heparin Lock 0 No ?Gait/Transferring ?Normal/ bed rest/ wheelchair 0 No ?Weak (short steps with or without shuffle, stooped but able to lift head while walking, may seek 0 No ?support from furniture) ?Impaired (short  steps with shuffle, may have difficulty arising from chair, head down, impaired 0 No ?balance) ?Mental Status ?Oriented to own ability 0 Yes ?Electronic Signature(s) ?Signed: 01/10/2022 4:05:12 PM By: Adline Peals ?Entered By: Adline Peals on 01/10/2022 13:53:18 ?-------------------------------------------------------------------------------- ?Foot Assessment Details ?Patient Name: ?Date of Service: ?Rich, [name] 01/10/2022 1:30 PM ?Medical Record Number: [id number] ?Patient Account Number: [id number] ?Date of Birth/Sex: ?Treating RN: ?Dec 18, 1945 (76 y.o. M) ?Primary Care Kenshin Splawn: Agustina Caroli ?Other Clinician: ?Referring Jammal Sarr: ?Treating Bryar Dahms/Extender: Fredirick Maudlin ?Agustina Caroli ?Weeks in Treatment: 0 ?Foot Assessment Items ?Site Locations ?+ = Sensation present, - = Sensation absent, C = Callus, U = Ulcer ?R = Redness, W = Warmth, M = Maceration, PU = Pre-ulcerative lesion ?F = Fissure, S = Swelling, D = Dryness ?Assessment ?Right: Left: ?Other Deformity: No No ?Prior Foot Ulcer: No No ?Prior Amputation: No No ?Charcot Joint: No No ?Ambulatory Status: Ambulatory With Help ?Assistance Device: Cane ?Gait: Steady ?Electronic Signature(s) ?Signed: 01/10/2022 4:05:12 PM By: Adline Peals ?Entered By: Adline Peals on 01/10/2022 14:15:56 ?-------------------------------------------------------------------------------- ?Nutrition Risk Screening Details ?Patient Name: ?Date of Service: ?EMRICK, HENSCH Rich 01/10/2022 1:30 PM ?Medical Record Number: [id number] ?Patient Account Number: [id number] ?Date of Birth/Sex: ?Treating RN: ?December 28, 1945 (76 y.o. M) ?Primary Care Hurshel Bouillon: Agustina Caroli ?Other Clinician: ?Referring Milind Raether: ?Treating Welda Azzarello/Extender: Fredirick Maudlin ?Agustina Caroli ?Weeks in Treatment: 0 ?Height (in): ?Weight (lbs): ?Body Mass Index (BMI): ?Nutrition Risk Screening Items ?Score Screening ?NUTRITION RISK SCREEN: ?I have an illness or condition that made me change the kind and/or amount of food I eat 0 No ?I eat fewer than two meals per day 0 No ?I eat few fruits and vegetables, or milk products 0 No ?I have three or more drinks of beer, liquor or wine almost every day 0 No ?I have tooth or mouth problems that make it hard for me to eat 0 No ?I don't always have enough money to buy the food I need 0 No ?I eat alone most of the time 0 No ?I take three or more different prescribed or over-the-counter drugs a day 0 No ?Without wanting to, I have lost or gained 10 pounds in the last six months 0 No ?I am not always physically able to shop, cook and/or feed myself 0 No ?Nutrition Protocols ?Good Risk Protocol 0 No interventions needed ?Moderate Risk Protocol ?High Risk Proctocol ?Risk Level: Good Risk ?Score: 0 ?Electronic Signature(s) ?Signed: 01/10/2022 4:05:12 PM By: Adline Peals ?Entered By: Adline Peals on 01/10/2022 13:54:13 ?

## 2022-01-17 ENCOUNTER — Encounter (HOSPITAL_BASED_OUTPATIENT_CLINIC_OR_DEPARTMENT_OTHER): Payer: Medicare Other | Admitting: General Surgery

## 2022-01-17 DIAGNOSIS — I872 Venous insufficiency (chronic) (peripheral): Secondary | ICD-10-CM | POA: Diagnosis not present

## 2022-01-17 DIAGNOSIS — I87303 Chronic venous hypertension (idiopathic) without complications of bilateral lower extremity: Secondary | ICD-10-CM | POA: Diagnosis not present

## 2022-01-17 DIAGNOSIS — L97212 Non-pressure chronic ulcer of right calf with fat layer exposed: Secondary | ICD-10-CM | POA: Diagnosis not present

## 2022-01-17 DIAGNOSIS — I2510 Atherosclerotic heart disease of native coronary artery without angina pectoris: Secondary | ICD-10-CM | POA: Diagnosis not present

## 2022-01-17 DIAGNOSIS — L97812 Non-pressure chronic ulcer of other part of right lower leg with fat layer exposed: Secondary | ICD-10-CM | POA: Diagnosis not present

## 2022-01-17 DIAGNOSIS — L97211 Non-pressure chronic ulcer of right calf limited to breakdown of skin: Secondary | ICD-10-CM | POA: Diagnosis not present

## 2022-01-17 DIAGNOSIS — I87302 Chronic venous hypertension (idiopathic) without complications of left lower extremity: Secondary | ICD-10-CM | POA: Diagnosis not present

## 2022-01-17 DIAGNOSIS — I10 Essential (primary) hypertension: Secondary | ICD-10-CM | POA: Diagnosis not present

## 2022-01-17 DIAGNOSIS — E785 Hyperlipidemia, unspecified: Secondary | ICD-10-CM | POA: Diagnosis not present

## 2022-01-17 NOTE — Progress Notes (Addendum)
Bryan, Rich ([id number]) ?Visit Report for 01/17/2022 ?Chief Complaint Document Details ?Patient Name: Date of Service: ?[name] 01/17/2022 10:45 A M ?Medical Record Number: [id number] ?Patient Account Number: [id number] ?Date of Birth/Sex: Treating RN: ?January 02, 1946 (76 y.o. M) ?Primary Care Provider: Agustina Caroli Other Clinician: ?Referring Provider: ?Treating Provider/Extender: Fredirick Maudlin ?Agustina Caroli ?Weeks in Treatment: 1 ?Information Obtained from: Patient ?Chief Complaint ?Right lower extremity wound ?Electronic Signature(s) ?Signed: 01/17/2022 11:31:57 AM By: Fredirick Maudlin MD FACS ?Entered By: Fredirick Maudlin on 01/17/2022 11:31:56 ?-------------------------------------------------------------------------------- ?Debridement Details ?Patient Name: Date of Service: ?[name] 01/17/2022 10:45 A M ?Medical Record Number: [id number] ?Patient Account Number: [id number] ?Date of Birth/Sex: Treating RN: ?05/25/1946 (76 y.o. Ernestene Mention ?Primary Care Provider: Agustina Caroli Other Clinician: ?Referring Provider: ?Treating Provider/Extender: Fredirick Maudlin ?Agustina Caroli ?Weeks in Treatment: 1 ?Debridement Performed for Assessment: Wound #2 Right,Posterior Lower Leg ?Performed By: Physician Fredirick Maudlin, MD ?Debridement Type: Debridement ?Severity of Tissue Pre Debridement: Fat layer exposed ?Level of Consciousness (Pre-procedure): Awake and Alert ?Pre-procedure Verification/Time Out Yes - 11:20 ?Taken: ?Start Time: 11:25 ?Pain Control: ?Other : benzocaine % spray20 ?T Area Debrided (L x W): ?otal 2 (cm) x 1.5 (cm) = 3 (cm?) ?Tissue and other material debrided: Viable, Non-Viable, Slough, Subcutaneous, Slough ?Level: Skin/Subcutaneous Tissue ?Debridement Description: Excisional ?Instrument: Curette ?Bleeding: Minimum ?Hemostasis Achieved: Pressure ?Procedural Pain: 0 ?Post Procedural Pain: 0 ?Response to Treatment: Procedure was tolerated well ?Level of Consciousness  (Post- Awake and Alert ?procedure): ?Post Debridement Measurements of Total Wound ?Length: (cm) 2 ?Width: (cm) 1.5 ?Depth: (cm) 0.1 ?Volume: (cm?) 0.236 ?Character of Wound/Ulcer Post Debridement: Improved ?Severity of Tissue Post Debridement: Fat layer exposed ?Post Procedure Diagnosis ?Same as Pre-procedure ?Electronic Signature(s) ?Signed: 01/17/2022 11:47:47 AM By: Fredirick Maudlin MD FACS ?Signed: 01/17/2022 6:48:31 PM By: Baruch Gouty RN, BSN ?Entered By: Baruch Gouty on 01/17/2022 11:28:08 ?-------------------------------------------------------------------------------- ?Debridement Details ?Patient Name: ?Date of Service: ?[name] 01/17/2022 10:45 A M ?Medical Record Number: [id number] ?Patient Account Number: [id number] ?Date of Birth/Sex: ?Treating RN: ?[birth year] (76 y.o. Ernestene Mention ?Primary Care Provider: Agustina Caroli ?Other Clinician: ?Referring Provider: ?Treating Provider/Extender: Fredirick Maudlin ?Agustina Caroli ?Weeks in Treatment: 1 ?Debridement Performed for Assessment: Wound #3 Right,Proximal,Lateral Lower Leg ?Performed By: Physician Fredirick Maudlin, MD ?Debridement Type: Debridement ?Severity of Tissue Pre Debridement: Fat layer exposed ?Level of Consciousness (Pre-procedure): Awake and Alert ?Pre-procedure Verification/Time Out Yes - 11:20 ?Taken: ?Start Time: 11:25 ?Pain Control: ?Other : benzocaine % spray20 ?T Area Debrided (L x W): ?otal 0.5 (cm) x 0.6 (cm) = 0.3 (cm?) ?Tissue and other material debrided: Non-Viable, Eschar ?Level: Non-Viable Tissue ?Debridement Description: Selective/Open Wound ?Instrument: Curette ?Bleeding: None ?Procedural Pain: 0 ?Post Procedural Pain: 0 ?Response to Treatment: Procedure was tolerated well ?Level of Consciousness (Post- Awake and Alert ?procedure): ?Post Debridement Measurements of Total Wound ?Length: (cm) 0.5 ?Width: (cm) 0.6 ?Depth: (cm) 0.1 ?Volume: (cm?) 0.024 ?Character of Wound/Ulcer Post Debridement: Improved ?Severity  of Tissue Post Debridement: Fat layer exposed ?Post Procedure Diagnosis ?Same as Pre-procedure ?Electronic Signature(s) ?Signed: 01/17/2022 11:47:47 AM By: Fredirick Maudlin MD FACS ?Signed: 01/17/2022 6:48:31 PM By: Baruch Gouty RN, BSN ?Entered By: Baruch Gouty on 01/17/2022 11:28:58 ?-------------------------------------------------------------------------------- ?HPI Details ?Patient Name: ?Date of Service: ?[name], [name] 01/17/2022 10:45 A M ?Medical Record Number: [id number] ?Patient Account Number: [id number] ?Date of Birth/Sex: ?Treating RN: ?01/14/1946 (76 y.o. M) ?Primary Care Provider: ?Other Clinician: ?Agustina Caroli ?Referring Provider: ?Treating Provider/Extender: Fredirick Maudlin ?Agustina Caroli ?Weeks in Treatment: 1 ?History of Present Illness ?HPI Description: Admission 08/23/2021 ?[name] is a 76 year old male with a past medical history of essential hypertension, coronary artery disease and hyperlipidemia that presents to the ?clinic for a wound to his left lower extremity. He states that 2 months ago he scratched the back of his leg creating a small wound that has not healed. He ?states it drains serous fluid constantly. He has been using Neosporin to the area and keeping it covered. He recently saw his primary care physician and was ?recommended compression stockings and an antibiotic. Patient reports improvement in redness with the use of Duricef. He has been wearing his compression ?stockings for the past week. He is also on a diuretic for his hypertension. He denies signs of infection. ?4/21; patient tolerated the compression wrap well. He has no issues or complaints today. ?READMISSION ?01/10/2022 ?[name] is back with a new venous ulcer on the right calf. He healed fairly quickly from his last left sided wound and says that he has been wearing ?compression stockings, albeit intermittently. He thinks they are 15 to 20 mmHg pressure. Since his last wound care visit, the  left-sided wound reopened, but he ?was able to get it closed on his own just using topical Neosporin. He attempted to do the same on the right, but it simply has persisted and his leg is fairly ?swollen and erythematous today. The wounds themselves are scattered and weeping serous fluid, consistent with venous stasis ulcers. ?01/17/2022: The erythema and swelling on the right are improved this week after a course of Augmentin. He has developed new blisters on his posterior left leg ?that he says came up when he started wearing compression stockings, but these are only 15 to 20 mmHg pressure. There is some accumulated eschar on his ?wounds, but no significant slough. We have been using silver alginate and 3 layer compression. ?Electronic Signature(s) ?Signed: 01/17/2022 11:33:19 AM By: Fredirick Maudlin MD FACS ?Entered By: Fredirick Maudlin on 01/17/2022 11:33:19 ?-------------------------------------------------------------------------------- ?Physical Exam Details ?Patient Name: ?Date of Service: ?MUEHL, [name] 01/17/2022 10:45 A M ?Medical Record Number: [id number] ?Patient Account Number: [id number] ?Date of Birth/Sex: ?Treating RN: ?[birth year] (76 y.o. M) ?Primary Care Provider: Agustina Caroli ?Other Clinician: ?Referring Provider: ?Treating Provider/Extender: Fredirick Maudlin ?Agustina Caroli ?Weeks in Treatment: 1 ?Constitutional ?Slightly hypertensive. Bradycardic, asymptomatic.. . . No acute distress. ?Respiratory ?Normal work of breathing on room air. ?Notes ?01/17/2022: The erythema on the right lower extremity is markedly improved. Edema control is good. There is crusted lymphorrhea/eschar on the ulcers. He has ?a new series of linear blisters on his left posterior calf, none of which are open. ?Electronic Signature(s) ?Signed: 01/17/2022 11:36:03 AM By: Fredirick Maudlin MD FACS ?Entered By: Fredirick Maudlin on 01/17/2022  11:36:03 ?-------------------------------------------------------------------------------- ?Physician Orders Details ?Patient Name: ?Date of Service: ?HENEGAR, [name] 01/17/2022 10:45 A M ?Medical Record Number: [id number] ?Patient Account Number: [id number] ?Date of Birth/Sex: ?Treating RN: ?5

## 2022-01-18 NOTE — Progress Notes (Signed)
ARIAN, MURLEY ([id number]) ?Visit Report for 01/17/2022 ?Arrival Information Details ?Patient Name: Date of Service: ?ORANTES, [name] 01/17/2022 10:45 A M ?Medical Record Number: [id number] ?Patient Account Number: [id number] ?Date of Birth/Sex: Treating RN: ?1946-06-29 (76 y.o. M) ?Primary Care Jef Futch: Agustina Caroli Other Clinician: ?Referring Thoma Paulsen: ?Treating Kwane Rohl/Extender: Fredirick Maudlin ?Agustina Caroli ?Weeks in Treatment: 1 ?Visit Information History Since Last Visit ?Added or deleted any medications: No ?Patient Arrived: Kasandra Knudsen ?Any new allergies or adverse reactions: No ?Arrival Time: 10:42 ?Had a fall or experienced change in No ?Accompanied By: wife ?activities of daily living that may affect ?Transfer Assistance: None ?risk of falls: ?Patient Identification Verified: Yes ?Signs or symptoms of abuse/neglect since last visito No ?Secondary Verification Process Completed: Yes ?Hospitalized since last visit: No ?Patient Requires Transmission-Based Precautions: No ?Implantable device outside of the clinic excluding No ?Patient Has Alerts: Yes ?cellular tissue based products placed in the center ?Patient Alerts: Patient on Blood Thinner since last visit: ?Xarelto Has Dressing in Place as Prescribed: Yes ?Pain Present Now: No ?Electronic Signature(s) ?Signed: 01/18/2022 8:49:12 AM By: Sandre Kitty ?Entered By: Sandre Kitty on 01/17/2022 10:42:29 ?-------------------------------------------------------------------------------- ?Compression Therapy Details ?Patient Name: Date of Service: ?LUNDIN, [name] 01/17/2022 10:45 A M ?Medical Record Number: [id number] ?Patient Account Number: [id number] ?Date of Birth/Sex: Treating RN: ?1946/05/01 (76 y.o. Ernestene Mention ?Primary Care Demetris Capell: Agustina Caroli Other Clinician: ?Referring Vee Bahe: ?Treating Lolly Glaus/Extender: Fredirick Maudlin ?Agustina Caroli ?Weeks in Treatment: 1 ?Compression Therapy Performed for Wound Assessment: Wound #2  Right,Posterior Lower Leg ?Performed By: Clinician Baruch Gouty, RN ?Compression Type: Three Layer ?Post Procedure Diagnosis ?Same as Pre-procedure ?Electronic Signature(s) ?Signed: 01/17/2022 6:48:31 PM By: Baruch Gouty RN, BSN ?Entered By: Baruch Gouty on 01/17/2022 11:29:42 ?-------------------------------------------------------------------------------- ?Compression Therapy Details ?Patient Name: ?Date of Service: ?ALBERTA, [name] 01/17/2022 10:45 A M ?Medical Record Number: [id number] ?Patient Account Number: [id number] ?Date of Birth/Sex: ?Treating RN: ?September 17, 1946 (76 y.o. Ernestene Mention ?Primary Care Deklyn Trachtenberg: Agustina Caroli ?Other Clinician: ?Referring Ronald Londo: ?Treating Bhavya Eschete/Extender: Fredirick Maudlin ?Agustina Caroli ?Weeks in Treatment: 1 ?Compression Therapy Performed for Wound Assessment: NonWound Condition Lymphedema - Left Leg ?Performed By: Clinician Baruch Gouty, RN ?Compression Type: Three Layer ?Post Procedure Diagnosis ?Same as Pre-procedure ?Electronic Signature(s) ?Signed: 01/17/2022 6:48:31 PM By: Baruch Gouty RN, BSN ?Entered By: Baruch Gouty on 01/17/2022 11:30:03 ?-------------------------------------------------------------------------------- ?Encounter Discharge Information Details ?Patient Name: ?Date of Service: ?OZBURN, [name] 01/17/2022 10:45 A M ?Medical Record Number: [id number] ?Patient Account Number: [id number] ?Date of Birth/Sex: ?Treating RN: ?Dec 16, 1945 (76 y.o. Ernestene Mention ?Primary Care Jayton Popelka: Agustina Caroli ?Other Clinician: ?Referring Renner Sebald: ?Treating Yarieliz Wasser/Extender: Fredirick Maudlin ?Agustina Caroli ?Weeks in Treatment: 1 ?Encounter Discharge Information Items Post Procedure Vitals ?Discharge Condition: Stable ?Temperature (F): 97.8 ?Ambulatory Status: Kasandra Knudsen ?Pulse (bpm): 52 ?Discharge Destination: Home ?Respiratory Rate (breaths/min): 18 ?Transportation: Private Auto ?Blood Pressure (mmHg): 145/90 ?Accompanied By: spouse ?Schedule  Follow-up Appointment: Yes ?Clinical Summary of Care: Patient Declined ?Electronic Signature(s) ?Signed: 01/17/2022 6:48:31 PM By: Baruch Gouty RN, BSN ?Entered By: Baruch Gouty on 01/17/2022 18:30:15 ?-------------------------------------------------------------------------------- ?Lower Extremity Assessment Details ?Patient Name: ?Date of Service: ?ALLMAN, [name] 01/17/2022 10:45 A M ?Medical Record Number: [id number] ?Patient Account Number: [id number] ?Date of Birth/Sex: ?Treating RN: ?1946-02-27 (76 y.o. Ernestene Mention ?Primary Care Glendia Olshefski: Agustina Caroli ?Other Clinician: ?Referring Jamichael Knotts: ?Treating Breanna Mcdaniel/Extender: Fredirick Maudlin ?Agustina Caroli ?Weeks in Treatment: 1 ?Edema Assessment ?Assessed: [Left: No] [Right: No] ?Edema: [Left: Yes] [Right: Yes] ?Calf ?Left: Right: ?Point of Measurement: From Medial Instep 46 cm 45.5 cm ?Ankle ?Left: Right: ?Point of Measurement: From Medial  Instep 28.5 cm 26.5 cm ?Knee To Floor ?Left: Right: ?From Medial Instep 46 cm 46 cm ?Vascular Assessment ?Pulses: ?Dorsalis Pedis ?Palpable: [Left:Yes] [Right:Yes] ?Electronic Signature(s) ?Signed: 01/17/2022 6:48:31 PM By: Baruch Gouty RN, BSN ?Entered By: Baruch Gouty on 01/17/2022 11:13:30 ?-------------------------------------------------------------------------------- ?Multi Wound Chart Details ?Patient Name: ?Date of Service: ?MENG, [name] 01/17/2022 10:45 A M ?Medical Record Number: [id number] ?Patient Account Number: [id number] ?Date of Birth/Sex: ?Treating RN: ?March 23, 1946 (76 y.o. M) ?Primary Care Kanesha Cadle: Agustina Caroli ?Other Clinician: ?Referring Amen Staszak: ?Treating Derion Kreiter/Extender: Fredirick Maudlin ?Agustina Caroli ?Weeks in Treatment: 1 ?Vital Signs ?Height(in): ?Pulse(bpm): 52 ?Weight(lbs): ?Blood Pressure(mmHg): 145/90 ?Body Mass Index(BMI): ?Temperature(??F): 97.8 ?Respiratory Rate(breaths/min): 18 ?Photos: ?Right, Posterior Lower Leg Right, Proximal, Lateral Lower Leg Right, Distal,  Lateral Lower Leg ?Wound Location: ?Blister Blister Blister ?Wounding Event: ?Venous Leg Ulcer Venous Leg Ulcer Venous Leg Ulcer ?Primary Etiology: ?Coronary Artery Disease, Coronary Artery Disease, Coronary Artery Disease, ?Comorbid History: ?Hypertension, Osteoarthritis Hypertension, Osteoarthritis Hypertension, Osteoarthritis ?10/10/2021 10/10/2021 10/10/2021 ?Date Acquired: ?'1 1 1 '$ ?Weeks of Treatment: ?Open Open Open ?Wound Status: ?No No No ?Wound Recurrence: ?2x1.5x0.1 0.5x0.6x0.1 0.2x0.2x0.1 ?Measurements L x W x D (cm) ?2.356 0.236 0.031 ?A (cm?) : ?rea ?0.236 0.024 0.003 ?Volume (cm?) : ?18.90% 72.20% 86.90% ?% Reduction in Area: ?18.90% 71.80% 87.50% ?% Reduction in Volume: ?Full Thickness Without Exposed Full Thickness Without Exposed Full Thickness Without Exposed ?Classification: ?Support Structures Support Structures Support Structures ?Medium Medium Medium ?Exudate Amount: ?Serous Serous Serous ?Exudate Type: ?Geophysical data processor ?Exudate Color: ?Distinct, outline attached Distinct, outline attached Distinct, outline attached ?Wound Margin: ?Large (67-100%) Large (67-100%) Small (1-33%) ?Granulation Amount: ?Pink Pink Pink ?Granulation Quality: ?None Present (0%) Small (1-33%) None Present (0%) ?Necrotic Amount: ?Fat Layer (Subcutaneous Tissue): Yes Fat Layer (Subcutaneous Tissue): Yes Fat Layer (Subcutaneous Tissue): Yes ?Exposed Structures: ?Fascia: No ?Fascia: No ?Fascia: No ?Tendon: No ?Tendon: No ?Tendon: No ?Muscle: No ?Muscle: No ?Muscle: No ?Joint: No ?Joint: No ?Joint: No ?Bone: No ?Bone: No ?Bone: No ?Medium (34-66%) Small (1-33%) Medium (34-66%) ?Epithelialization: ?N/A N/A scabbed ?Assessment Notes: ?Treatment Notes ?Electronic Signature(s) ?Signed: 01/17/2022 11:17:51 AM By: Fredirick Maudlin MD FACS ?Entered By: Fredirick Maudlin on 01/17/2022 11:17:51 ?-------------------------------------------------------------------------------- ?Multi-Disciplinary Care Plan Details ?Patient Name: ?Date of  Service: ?REITHER, [name] 01/17/2022 10:45 A M ?Medical Record Number: [id number] ?Patient Account Number: [id number] ?Date of Birth/Sex: ?Treating RN: ?1945-12-11 (76 y.o. Ernestene Mention ?Primary Care Lititia Sen: Sa

## 2022-01-19 ENCOUNTER — Other Ambulatory Visit: Payer: Self-pay | Admitting: Emergency Medicine

## 2022-01-19 DIAGNOSIS — R609 Edema, unspecified: Secondary | ICD-10-CM

## 2022-01-24 ENCOUNTER — Encounter (HOSPITAL_BASED_OUTPATIENT_CLINIC_OR_DEPARTMENT_OTHER): Payer: Medicare Other | Admitting: General Surgery

## 2022-01-24 DIAGNOSIS — I87303 Chronic venous hypertension (idiopathic) without complications of bilateral lower extremity: Secondary | ICD-10-CM | POA: Diagnosis not present

## 2022-01-24 DIAGNOSIS — E785 Hyperlipidemia, unspecified: Secondary | ICD-10-CM | POA: Diagnosis not present

## 2022-01-24 DIAGNOSIS — L97212 Non-pressure chronic ulcer of right calf with fat layer exposed: Secondary | ICD-10-CM | POA: Diagnosis not present

## 2022-01-24 DIAGNOSIS — I10 Essential (primary) hypertension: Secondary | ICD-10-CM | POA: Diagnosis not present

## 2022-01-24 DIAGNOSIS — I2510 Atherosclerotic heart disease of native coronary artery without angina pectoris: Secondary | ICD-10-CM | POA: Diagnosis not present

## 2022-01-24 DIAGNOSIS — L97812 Non-pressure chronic ulcer of other part of right lower leg with fat layer exposed: Secondary | ICD-10-CM | POA: Diagnosis not present

## 2022-01-24 DIAGNOSIS — L97211 Non-pressure chronic ulcer of right calf limited to breakdown of skin: Secondary | ICD-10-CM | POA: Diagnosis not present

## 2022-01-24 NOTE — Progress Notes (Addendum)
SAGAR, TENGAN ([id number]) ?Visit Report for 01/24/2022 ?Chief Complaint Document Details ?Patient Name: Date of Service: ?DEBOW, [name] 01/24/2022 11:00 A M ?Medical Record Number: [id number] ?Patient Account Number: [id number] ?Date of Birth/Sex: Treating RN: ?1946/09/15 (76 y.o. M) ?Primary Care Provider: Agustina Caroli Other Clinician: ?Referring Provider: ?Treating Provider/Extender: Fredirick Maudlin ?Agustina Caroli ?Weeks in Treatment: 2 ?Information Obtained from: Patient ?Chief Complaint ?Right lower extremity wound ?Electronic Signature(s) ?Signed: 01/24/2022 11:48:44 AM By: Fredirick Maudlin MD FACS ?Entered By: Fredirick Maudlin on 01/24/2022 11:48:44 ?-------------------------------------------------------------------------------- ?HPI Details ?Patient Name: Date of Service: ?KILLGORE, [name] 01/24/2022 11:00 A M ?Medical Record Number: [id number] ?Patient Account Number: [id number] ?Date of Birth/Sex: Treating RN: ?1946/07/25 (76 y.o. M) ?Primary Care Provider: Agustina Caroli Other Clinician: ?Referring Provider: ?Treating Provider/Extender: Fredirick Maudlin ?Agustina Caroli ?Weeks in Treatment: 2 ?History of Present Illness ?HPI Description: Admission 08/23/2021 ?Mr. Treavon Castilleja is a 76 year old male with a past medical history of essential hypertension, coronary artery disease and hyperlipidemia that presents to the ?clinic for a wound to his left lower extremity. He states that 2 months ago he scratched the back of his leg creating a small wound that has not healed. He ?states it drains serous fluid constantly. He has been using Neosporin to the area and keeping it covered. He recently saw his primary care physician and was ?recommended compression stockings and an antibiotic. Patient reports improvement in redness with the use of Duricef. He has been wearing his compression ?stockings for the past week. He is also on a diuretic for his hypertension. He denies signs of infection. ?4/21; patient  tolerated the compression wrap well. He has no issues or complaints today. ?READMISSION ?01/10/2022 ?Mr. Rajewski is back with a new venous ulcer on the right calf. He healed fairly quickly from his last left sided wound and says that he has been wearing ?compression stockings, albeit intermittently. He thinks they are 15 to 20 mmHg pressure. Since his last wound care visit, the left-sided wound reopened, but he ?was able to get it closed on his own just using topical Neosporin. He attempted to do the same on the right, but it simply has persisted and his leg is fairly ?swollen and erythematous today. The wounds themselves are scattered and weeping serous fluid, consistent with venous stasis ulcers. ?01/17/2022: The erythema and swelling on the right are improved this week after a course of Augmentin. He has developed new blisters on his posterior left leg ?that he says came up when he started wearing compression stockings, but these are only 15 to 20 mmHg pressure. There is some accumulated eschar on his ?wounds, but no significant slough. We have been using silver alginate and 3 layer compression. ?01/24/2022: His wounds are all healed, but his juxta lite stockings did not arrive. ?Electronic Signature(s) ?Signed: 01/24/2022 11:49:17 AM By: Fredirick Maudlin MD FACS ?Entered By: Fredirick Maudlin on 01/24/2022 11:49:16 ?-------------------------------------------------------------------------------- ?Physical Exam Details ?Patient Name: Date of Service: ?MULLENDORE, [name] 01/24/2022 11:00 A M ?Medical Record Number: [id number] ?Patient Account Number: [id number] ?Date of Birth/Sex: Treating RN: ?03/24/1946 (76 y.o. M) ?Primary Care Provider: Agustina Caroli Other Clinician: ?Referring Provider: ?Treating Provider/Extender: Fredirick Maudlin ?Agustina Caroli ?Weeks in Treatment: 2 ?Constitutional ?. . . . No acute distress. ?Respiratory ?Normal work of breathing on room air. ?Notes ?01/24/2022: All wounds are closed. ?Electronic  Signature(s) ?Signed: 01/24/2022 11:52:18 AM By: Fredirick Maudlin MD FACS ?Entered By: Fredirick Maudlin on 01/24/2022 11:52:17 ?-------------------------------------------------------------------------------- ?Physician Orders Details ?Patient Name: Date of Service: ?Hogate, [name] 01/24/2022 11:00 A  M ?Medical Record Number: [id number] ?Patient Account Number: [id number] ?Date of Birth/Sex: Treating RN: ?Jun 25, 1946 (76 y.o. 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 - Ok to use Biomedical engineer, can purchase at [facility], [facility], or [place] ?Edema Control - Lymphedema / SCD / Other ?Elevate legs to the level of the heart or above for 30 minutes daily and/or when sitting, a frequency of: - throughout the day ?Avoid standing for long periods of time. ?Moisturize legs daily. - left leg daily ?Non Wound Condition ?Left Lower Extremity ?pply the following to affected area as directed: - silver alginate to cover blistering and apply 3 layer wrap ?A ?Other Non Wound Condition Orders/Instructions: - 3 layer wraps to Left leg-Lymphadema ?Wound Treatment ?Wound #2 - Lower Leg Wound Laterality: Right, Posterior ?Cleanser: Soap and Water 1 x Per Week/30 Days ?Discharge Instructions: May shower and wash wound with dial antibacterial soap and water prior  to dressing change. ?Cleanser: Wound Cleanser 1 x Per Week/30 Days ?Discharge Instructions: Cleanse the wound with wound cleanser prior to applying a clean dressing using gauze sponges, not tissue or cotton balls. ?Peri-Wound Care: Triamcinolone 15 (g) 1 x Per Week/30 Days ?Discharge Instructions: Use triamcinolone 15 (g) as directed ?Peri-Wound Care: Sween Lotion (Moisturizing lotion) 1 x Per Week/30 Days ?Discharge Instructions: Apply moisturizing lotion as directed ?Compression Wrap: ThreePress (3 layer compression wrap) 1 x Per Week/30 Days ?Discharge Instructions: Apply three layer compression as directed. ?Wound #3 - Lower Leg Wound Laterality: Right, Lateral, Proximal ?Cleanser: Soap and Water 1 x Per Week ?Discharge Instructions: May shower and wash wound with dial antibacterial soap and water prior to dressing change. ?Cleanser: Wound Cleanser 1 x Per Week ?Discharge Instructions: Cleanse the wound with wound cleanser prior to applying a clean dressing using gauze sponges, not tissue or cotton balls. ?Peri-Wound Care: Triamcinolone 15 (g) 1 x Per Week ?Discharge Instructions: Use triamcinolone 15 (g) as directed ?Peri-Wound Care: Sween Lotion (Moisturizing lotion) 1 x Per Week ?Discharge Instructions: Apply moisturizing lotion as directed ?Prim Dressing: KerraCel Ag Gelling Fiber Dressing, 4x5 in (silver alginate) 1 x Per Week ?ary ?Discharge Instructions: Apply silver alginate to wound bed as instructed ?Compression Wrap: ThreePress (3 layer compression wrap) 1 x Per Week ?Discharge Instructions: Apply three layer compression as directed. ?Wound #4 - Lower Leg Wound Laterality: Right, Lateral, Distal ?Cleanser: Soap and Water 1 x Per Week/30 Days ?Discharge Instructions: May shower and wash wound with dial antibacterial soap and water prior to dressing change. ?Cleanser: Wound Cleanser 1 x Per Week/30 Days ?Discharge Instructions: Cleanse the wound with wound cleanser prior to applying a clean dressing  using gauze sponges, not tissue or cotton balls. ?Peri-Wound Care: Triamcinolone 15 (g) 1 x Per Week/30 Days ?Discharge Instructions: Use triamcinolone 15 (g) as directed ?Peri-Wound Care: Maude Leriche [facility]

## 2022-01-26 NOTE — Progress Notes (Signed)
[name], [name] ([id number]) ?Visit Report for 01/24/2022 ?Arrival Information Details ?Patient Name: Date of Service: ?Rich, [name] 01/24/2022 11:00 A M ?Medical Record Number: [id number] ?Patient Account Number: [id number] ?Date of Birth/Sex: Treating RN: ?01-Jul-1946 (76 y.o. M) ?Primary Care Meri Pelot: Agustina Caroli Other Clinician: ?Referring Savon Bordonaro: ?Treating Temperance Kelemen/Extender: Fredirick Maudlin ?Agustina Caroli ?Weeks in Treatment: 2 ?Visit Information History Since Last Visit ?Added or deleted any medications: No ?Patient Arrived: Ambulatory ?Any new allergies or adverse reactions: No ?Arrival Time: 11:04 ?Had a fall or experienced change in No ?Accompanied By: wife ?activities of daily living that may affect ?Transfer Assistance: None ?risk of falls: ?Patient Identification Verified: Yes ?Signs or symptoms of abuse/neglect since last visito No ?Secondary Verification Process Completed: Yes ?Hospitalized since last visit: No ?Patient Requires Transmission-Based Precautions: No ?Implantable device outside of the clinic excluding No ?Patient Has Alerts: Yes ?cellular tissue based products placed in the center ?Patient Alerts: Patient on Blood Thinner since last visit: ?Xarelto Has Dressing in Place as Prescribed: Yes ?Pain Present Now: No ?Electronic Signature(s) ?Signed: 01/26/2022 3:16:57 PM By: Sandre Kitty ?Entered By: Sandre Kitty on 01/24/2022 11:05:51 ?-------------------------------------------------------------------------------- ?Compression Therapy Details ?Patient Name: Date of Service: ?Rich, [name] 01/24/2022 11:00 A M ?Medical Record Number: [id number] ?Patient Account Number: [id number] ?Date of Birth/Sex: Treating RN: ?1946/08/30 (76 y.o. [name]) Dellie Catholic ?Primary Care Meyer Dockery: Agustina Caroli Other Clinician: ?Referring Damoni Causby: ?Treating Lawrence Mitch/Extender: Fredirick Maudlin ?Agustina Caroli ?Weeks in Treatment: 2 ?Compression Therapy Performed for Wound Assessment: NonWound  Condition Lymphedema - Left Leg ?Performed By: Clinician Dellie Catholic, RN ?Compression Type: Three Layer ?Post Procedure Diagnosis ?Same as Pre-procedure ?Electronic Signature(s) ?Signed: 01/24/2022 5:48:22 PM By: Dellie Catholic RN ?Entered By: Dellie Catholic on 01/24/2022 11:54:31 ?-------------------------------------------------------------------------------- ?Compression Therapy Details ?Patient Name: ?Date of Service: ?Rich, [name] 01/24/2022 11:00 A M ?Medical Record Number: [id number] ?Patient Account Number: [id number] ?Date of Birth/Sex: ?Treating RN: ?May 27, 1946 (76 y.o. [name]) Dellie Catholic ?Primary Care Molli Gethers: Agustina Caroli ?Other Clinician: ?Referring Osborn Pullin: ?Treating Murel Wigle/Extender: Fredirick Maudlin ?Agustina Caroli ?Weeks in Treatment: 2 ?Compression Therapy Performed for Wound Assessment: Wound #2 Right,Posterior Lower Leg ?Performed By: Clinician Dellie Catholic, RN ?Compression Type: Three Layer ?Post Procedure Diagnosis ?Same as Pre-procedure ?Electronic Signature(s) ?Signed: 01/24/2022 5:48:22 PM By: Dellie Catholic RN ?Entered By: Dellie Catholic on 01/24/2022 11:55:09 ?-------------------------------------------------------------------------------- ?Compression Therapy Details ?Patient Name: ?Date of Service: ?SPILDE, [name] 01/24/2022 11:00 A M ?Medical Record Number: [id number] ?Patient Account Number: [id number] ?Date of Birth/Sex: ?Treating RN: ?1946-03-03 (76 y.o. [name]) Dellie Catholic ?Primary Care Daoud Lobue: Agustina Caroli ?Other Clinician: ?Referring Wynne Rozak: ?Treating Saquoia Sianez/Extender: Fredirick Maudlin ?Agustina Caroli ?Weeks in Treatment: 2 ?Compression Therapy Performed for Wound Assessment: Wound #4 Right,Distal,Lateral Lower Leg ?Performed By: Clinician Dellie Catholic, RN ?Compression Type: Three Layer ?Post Procedure Diagnosis ?Same as Pre-procedure ?Electronic Signature(s) ?Signed: 01/24/2022 5:48:22 PM By: Dellie Catholic RN ?Entered By: Dellie Catholic on  01/24/2022 11:55:09 ?-------------------------------------------------------------------------------- ?Compression Therapy Details ?Patient Name: ?Date of Service: ?Rich, [name] 01/24/2022 11:00 A M ?Medical Record Number: [id number] ?Patient Account Number: [id number] ?Date of Birth/Sex: ?Treating RN: ?July 09, 1946 (76 y.o. [name]) Dellie Catholic ?Primary Care Caylin Nass: Agustina Caroli ?Other Clinician: ?Referring Charm Stenner: ?Treating Kenitra Leventhal/Extender: Fredirick Maudlin ?Agustina Caroli ?Weeks in Treatment: 2 ?Compression Therapy Performed for Wound Assessment: Wound #3 Right,Proximal,Lateral Lower Leg ?Performed By: Clinician Dellie Catholic, RN ?Compression Type: Three Layer ?Post Procedure Diagnosis ?Same as Pre-procedure ?Electronic Signature(s) ?Signed: 01/24/2022 5:48:22 PM By: Dellie Catholic RN ?Signed: 01/24/2022 5:48:22 PM By: Dellie Catholic RN ?Entered By: Dellie Catholic on 01/24/2022 11:55:09 ?-------------------------------------------------------------------------------- ?Encounter Discharge Information Details ?  Patient Name: ?Date of Service: ?Rich, [name] 01/24/2022 11:00 A M ?Medical Record Number: [id number] ?Patient Account Number: [id number] ?Date of Birth/Sex: ?Treating RN: ?Aug 18, 1946 (76 y.o. [name]) Dellie Catholic ?Primary Care Illya Gienger: Agustina Caroli ?Other Clinician: ?Referring Smiley Birr: ?Treating Rafiel Mecca/Extender: Fredirick Maudlin ?Agustina Caroli ?Weeks in Treatment: 2 ?Encounter Discharge Information Items ?Discharge Condition: Stable ?Ambulatory Status: Ambulatory ?Discharge Destination: Home ?Transportation: Private Auto ?Accompanied By: spouse ?Schedule Follow-up Appointment: Yes ?Clinical Summary of Care: Patient Declined ?Electronic Signature(s) ?Signed: 01/24/2022 5:48:22 PM By: Dellie Catholic RN ?Entered By: Dellie Catholic on 01/24/2022 16:48:48 ?-------------------------------------------------------------------------------- ?Lower Extremity Assessment Details ?Patient Name: ?Date  of Service: ?CAGGIANO, [name] 01/24/2022 11:00 A M ?Medical Record Number: [id number] ?Patient Account Number: [id number] ?Date of Birth/Sex: ?Treating RN: ?[birth year] (76 y.o. [name]) Dellie Catholic ?Primary Care Langford Carias: Agustina Caroli ?Other Clinician: ?Referring Daveah Varone: ?Treating Joffrey Kerce/Extender: Fredirick Maudlin ?Agustina Caroli ?Weeks in Treatment: 2 ?Edema Assessment ?Assessed: [Left: No] [Right: No] ?Edema: [Left: Yes] [Right: Yes] ?Calf ?Left: Right: ?Point of Measurement: 43 cm From Medial Instep 46 cm 45.5 cm ?Ankle ?Left: Right: ?Point of Measurement: 27 cm From Medial Instep 28.5 cm 26.7 cm ?Knee To Floor ?Left: Right: ?From Medial Instep 53 cm 53 cm ?Electronic Signature(s) ?Signed: 01/24/2022 5:48:22 PM By: Dellie Catholic RN ?Entered By: Dellie Catholic on 01/24/2022 11:52:10 ?-------------------------------------------------------------------------------- ?Multi Wound Chart Details ?Patient Name: ?Date of Service: ?HERNAN, [name] 01/24/2022 11:00 A M ?Medical Record Number: [id number] ?Patient Account Number: [id number] ?Date of Birth/Sex: ?Treating RN: ?03-01-1946 (76 y.o. M) ?Primary Care Daylyn Christine: Agustina Caroli ?Other Clinician: ?Referring Ica Daye: ?Treating Marwa Fuhrman/Extender: Fredirick Maudlin ?Agustina Caroli ?Weeks in Treatment: 2 ?Vital Signs ?Height(in): ?Pulse(bpm): 75 ?Weight(lbs): ?Blood Pressure(mmHg): 146/78 ?Body Mass Index(BMI): ?Temperature(??F): 97.9 ?Respiratory Rate(breaths/min): 18 ?Photos: [2:No Photos Right, Posterior Lower Leg] [3:No Photos Right, Proximal, Lateral Lower Leg] [4:No Photos Right, Distal, Lateral Lower Leg] ?Wound Location: [2:Blister] [3:Blister] [4:Blister] ?Wounding Event: [2:Venous Leg Ulcer] [3:Venous Leg Ulcer] [4:Venous Leg Ulcer] ?Primary Etiology: [2:Coronary Artery Disease,] [3:Coronary Artery Disease,] [4:Coronary Artery Disease,] ?Comorbid History: [2:Hypertension, Osteoarthritis 10/10/2021] [3:Hypertension, Osteoarthritis 10/10/2021] [4:Hypertension,  Osteoarthritis 10/10/2021] ?Date Acquired: [2:2] [3:2] [4:2] ?Weeks of Treatment: [2:Open] [3:Open] [4:Open] ?Wound Status: [2:No] [3:No] [4:No] ?Wound Recurrence: [2:0.1x0.1x0.1] [3:0.1x0.1x0.1] [4:0.1x0.1x0.1] ?Mea

## 2022-01-28 ENCOUNTER — Encounter (HOSPITAL_BASED_OUTPATIENT_CLINIC_OR_DEPARTMENT_OTHER): Payer: Medicare Other | Admitting: General Surgery

## 2022-01-28 DIAGNOSIS — I87303 Chronic venous hypertension (idiopathic) without complications of bilateral lower extremity: Secondary | ICD-10-CM | POA: Diagnosis not present

## 2022-01-28 DIAGNOSIS — I2510 Atherosclerotic heart disease of native coronary artery without angina pectoris: Secondary | ICD-10-CM | POA: Diagnosis not present

## 2022-01-28 DIAGNOSIS — E785 Hyperlipidemia, unspecified: Secondary | ICD-10-CM | POA: Diagnosis not present

## 2022-01-28 DIAGNOSIS — I10 Essential (primary) hypertension: Secondary | ICD-10-CM | POA: Diagnosis not present

## 2022-01-28 DIAGNOSIS — L97211 Non-pressure chronic ulcer of right calf limited to breakdown of skin: Secondary | ICD-10-CM | POA: Diagnosis not present

## 2022-01-28 NOTE — Progress Notes (Signed)
STANISLAW, ACTON ([id number]) ?Visit Report for 01/28/2022 ?Arrival Information Details ?Patient Name: Date of Service: ?CURL, [name] 01/28/2022 10:00 A M ?Medical Record Number: [id number] ?Patient Account Number: [id number] ?Date of Birth/Sex: Treating RN: ?March 17, 1946 (76 y.o. [name] ?Primary Care Yuritza Paulhus: Agustina Caroli Other Clinician: ?Referring Kapri Nero: ?Treating Jerzie Bieri/Extender: Fredirick Maudlin ?Agustina Caroli ?Weeks in Treatment: 2 ?Visit Information History Since Last Visit ?Added or deleted any medications: No ?Patient Arrived: Ambulatory ?Any new allergies or adverse reactions: No ?Arrival Time: 11:15 ?Had a fall or experienced change in No ?Accompanied By: wife ?activities of daily living that may affect ?Transfer Assistance: None ?risk of falls: ?Patient Identification Verified: Yes ?Signs or symptoms of abuse/neglect since last visito No ?Secondary Verification Process Completed: Yes ?Hospitalized since last visit: No ?Patient Requires Transmission-Based Precautions: No ?Implantable device outside of the clinic excluding No ?Patient Has Alerts: Yes ?cellular tissue based products placed in the center ?Patient Alerts: Patient on Blood Thinner since last visit: ?Xarelto Has Dressing in Place as Prescribed: Yes ?Has Compression in Place as Prescribed: Yes ?Pain Present Now: No ?Electronic Signature(s) ?Signed: 01/28/2022 6:10:38 PM By: Levan Hurst RN, BSN ?Entered By: Levan Hurst on 01/28/2022 17:29:06 ?-------------------------------------------------------------------------------- ?Clinic Level of Care Assessment Details ?Patient Name: Date of Service: ?AMBORN, [name] 01/28/2022 10:00 A M ?Medical Record Number: [id number] ?Patient Account Number: [id number] ?Date of Birth/Sex: Treating RN: ?[birth year] (76 y.o. [name] ?Primary Care Jakyia Gaccione: Agustina Caroli Other Clinician: ?Referring Anyia Gierke: ?Treating Barre Aydelott/Extender: Fredirick Maudlin ?Agustina Caroli ?Weeks in  Treatment: 2 ?Clinic Level of Care Assessment Items ?TOOL 4 Quantity Score ?X- 1 0 ?Use when only an EandM is performed on FOLLOW-UP visit ?ASSESSMENTS - Nursing Assessment / Reassessment ?X- 1 10 ?Reassessment of Co-morbidities (includes updates in patient status) ?X- 1 5 ?Reassessment of Adherence to Treatment Plan ?ASSESSMENTS - Wound and Skin A ssessment / Reassessment ?X - Simple Wound Assessment / Reassessment - one wound 1 5 ?'[]'$  - 0 ?Complex Wound Assessment / Reassessment - multiple wounds ?'[]'$  - 0 ?Dermatologic / Skin Assessment (not related to wound area) ?ASSESSMENTS - Focused Assessment ?'[]'$  - 0 ?Circumferential Edema Measurements - multi extremities ?'[]'$  - 0 ?Nutritional Assessment / Counseling / Intervention ?'[]'$  - 0 ?Lower Extremity Assessment (monofilament, tuning fork, pulses) ?'[]'$  - 0 ?Peripheral Arterial Disease Assessment (using hand held doppler) ?ASSESSMENTS - Ostomy and/or Continence Assessment and Care ?'[]'$  - 0 ?Incontinence Assessment and Management ?'[]'$  - 0 ?Ostomy Care Assessment and Management (repouching, etc.) ?PROCESS - Coordination of Care ?X - Simple Patient / Family Education for ongoing care 1 15 ?'[]'$  - 0 ?Complex (extensive) Patient / Family Education for ongoing care ?X- 1 10 ?Staff obtains Consents, Records, T Results / Process Orders ?est ?'[]'$  - 0 ?Staff telephones HHA, Nursing Homes / Clarify orders / etc ?'[]'$  - 0 ?Routine Transfer to another Facility (non-emergent condition) ?'[]'$  - 0 ?Routine Hospital Admission (non-emergent condition) ?'[]'$  - 0 ?New Admissions / Biomedical engineer / Ordering NPWT Apligraf, etc. ?, ?'[]'$  - 0 ?Emergency Hospital Admission (emergent condition) ?X- 1 10 ?Simple Discharge Coordination ?'[]'$  - 0 ?Complex (extensive) Discharge Coordination ?PROCESS - Special Needs ?'[]'$  - 0 ?Pediatric / Minor Patient Management ?'[]'$  - 0 ?Isolation Patient Management ?'[]'$  - 0 ?Hearing / Language / Visual special needs ?'[]'$  - 0 ?Assessment of Community assistance (transportation,  D/C planning, etc.) ?'[]'$  - 0 ?Additional assistance / Altered mentation ?'[]'$  - 0 ?Support Surface(s) Assessment (bed, cushion, seat, etc.) ?INTERVENTIONS - Wound Cleansing / Measurement ?X - Simple Wound Cleansing -  one wound 1 5 ?'[]'$  - 0 ?Complex Wound Cleansing - multiple wounds ?'[]'$  - 0 ?Wound Imaging (photographs - any number of wounds) ?'[]'$  - 0 ?Wound Tracing (instead of photographs) ?X- 1 5 ?Simple Wound Measurement - one wound ?'[]'$  - 0 ?Complex Wound Measurement - multiple wounds ?INTERVENTIONS - Wound Dressings ?'[]'$  - 0 ?Small Wound Dressing one or multiple wounds ?'[]'$  - 0 ?Medium Wound Dressing one or multiple wounds ?'[]'$  - 0 ?Large Wound Dressing one or multiple wounds ?'[]'$  - 0 ?Application of Medications - topical ?'[]'$  - 0 ?Application of Medications - injection ?INTERVENTIONS - Miscellaneous ?'[]'$  - 0 ?External ear exam ?'[]'$  - 0 ?Specimen Collection (cultures, biopsies, blood, body fluids, etc.) ?'[]'$  - 0 ?Specimen(s) / Culture(s) sent or taken to Lab for analysis ?'[]'$  - 0 ?Patient Transfer (multiple staff / Civil Service fast streamer / Similar devices) ?'[]'$  - 0 ?Simple Staple / Suture removal (25 or less) ?'[]'$  - 0 ?Complex Staple / Suture removal (26 or more) ?'[]'$  - 0 ?Hypo / Hyperglycemic Management (close monitor of Blood Glucose) ?'[]'$  - 0 ?Ankle / Brachial Index (ABI) - do not check if billed separately ?X- 1 5 ?Vital Signs ?Has the patient been seen at the hospital within the last three years: Yes ?Total Score: 70 ?Level Of Care: New/Established - Level 2 ?Electronic Signature(s) ?Signed: 01/28/2022 6:10:38 PM By: Levan Hurst RN, BSN ?Entered By: Levan Hurst on 01/28/2022 17:33:47 ?-------------------------------------------------------------------------------- ?Encounter Discharge Information Details ?Patient Name: Date of Service: ?TUTSON, [name] 01/28/2022 10:00 A M ?Medical Record Number: [id number] ?Patient Account Number: [id number] ?Date of Birth/Sex: Treating RN: ?08-Mar-1946 (76 y.o. [name] ?Primary Care La Shehan:  Agustina Caroli Other Clinician: ?Referring Meta Kroenke: ?Treating Jessieca Rhem/Extender: Fredirick Maudlin ?Agustina Caroli ?Weeks in Treatment: 2 ?Encounter Discharge Information Items ?Discharge Condition: Stable ?Ambulatory Status: Ambulatory ?Discharge Destination: Home ?Transportation: Private Auto ?Accompanied By: wife ?Schedule Follow-up Appointment: Yes ?Clinical Summary of Care: Patient Declined ?Electronic Signature(s) ?Signed: 01/28/2022 6:10:38 PM By: Levan Hurst RN, BSN ?Entered By: Levan Hurst on 01/28/2022 17:32:04 ?-------------------------------------------------------------------------------- ?Patient/Caregiver Education Details ?Patient Name: Date of Service: ?Micale, [name] 4/21/2023andnbsp10:00 A M ?Medical Record Number: [id number] ?Patient Account Number: [id number] ?Date of Birth/Gender: Treating RN: ?1946/06/17 (76 y.o. [name] ?Primary Care Physician: Agustina Caroli Other Clinician: ?Referring Physician: ?Treating Physician/Extender: Fredirick Maudlin ?Agustina Caroli ?Weeks in Treatment: 2 ?Education Assessment ?Education Provided To: ?Patient ?Education Topics Provided ?Venous: ?Methods: Demonstration, Explain/Verbal ?Responses: Return demonstration correctly, State content correctly ?Notes ?Demonstrated to patient and wife on how to apply Juxtalite, patient wife able to demonstrate application back correctly. ?Electronic Signature(s) ?Signed: 01/28/2022 6:10:38 PM By: Levan Hurst RN, BSN ?Entered By: Levan Hurst on 01/28/2022 17:31:50 ?-------------------------------------------------------------------------------- ?Wound Assessment Details ?Patient Name: ?Date of Service: ?TOUCHET, [name] 01/28/2022 10:00 A M ?Medical Record Number: [id number] ?Patient Account Number: [id number] ?Date of Birth/Sex: ?Treating RN: ?[birth year] (76 y.o. [name] ?Primary Care Eriyana Sweeten: Agustina Caroli ?Other Clinician: ?Referring Tigerlily Christine: ?Treating Analise Glotfelty/Extender: Fredirick Maudlin ?Agustina Caroli ?Weeks in Treatment: 2 ?Wound Status ?Wound Number: 2 ?Primary Etiology: Venous Leg Ulcer ?Wound Location: Right, Posterior Lower Leg ?Wound Status: Healed - Epithelialized ?Wounding Eve

## 2022-01-31 NOTE — Progress Notes (Signed)
PHAROAH, GOGGINS ([id number]) ?Visit Report for 01/28/2022 ?SuperBill Details ?Patient Name: Date of Service: ?[name], [name] 01/28/2022 ?Medical Record Number: [id number] ?Patient Account Number: [id number] ?Date of Birth/Sex: Treating RN: ?10/05/1946 (76 y.o. 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

## 2022-02-02 ENCOUNTER — Encounter (HOSPITAL_BASED_OUTPATIENT_CLINIC_OR_DEPARTMENT_OTHER): Payer: Medicare Other | Admitting: General Surgery

## 2022-02-02 DIAGNOSIS — I2510 Atherosclerotic heart disease of native coronary artery without angina pectoris: Secondary | ICD-10-CM | POA: Diagnosis not present

## 2022-02-02 DIAGNOSIS — E785 Hyperlipidemia, unspecified: Secondary | ICD-10-CM | POA: Diagnosis not present

## 2022-02-02 DIAGNOSIS — I87303 Chronic venous hypertension (idiopathic) without complications of bilateral lower extremity: Secondary | ICD-10-CM | POA: Diagnosis not present

## 2022-02-02 DIAGNOSIS — L97211 Non-pressure chronic ulcer of right calf limited to breakdown of skin: Secondary | ICD-10-CM | POA: Diagnosis not present

## 2022-02-02 DIAGNOSIS — I10 Essential (primary) hypertension: Secondary | ICD-10-CM | POA: Diagnosis not present

## 2022-02-02 DIAGNOSIS — L97212 Non-pressure chronic ulcer of right calf with fat layer exposed: Secondary | ICD-10-CM | POA: Diagnosis not present

## 2022-02-08 ENCOUNTER — Encounter (HOSPITAL_BASED_OUTPATIENT_CLINIC_OR_DEPARTMENT_OTHER): Payer: Medicare Other | Attending: General Surgery | Admitting: General Surgery

## 2022-02-08 DIAGNOSIS — I2510 Atherosclerotic heart disease of native coronary artery without angina pectoris: Secondary | ICD-10-CM | POA: Diagnosis not present

## 2022-02-08 DIAGNOSIS — L97212 Non-pressure chronic ulcer of right calf with fat layer exposed: Secondary | ICD-10-CM | POA: Diagnosis not present

## 2022-02-08 DIAGNOSIS — L97211 Non-pressure chronic ulcer of right calf limited to breakdown of skin: Secondary | ICD-10-CM | POA: Insufficient documentation

## 2022-02-08 DIAGNOSIS — I87303 Chronic venous hypertension (idiopathic) without complications of bilateral lower extremity: Secondary | ICD-10-CM | POA: Insufficient documentation

## 2022-02-08 DIAGNOSIS — E785 Hyperlipidemia, unspecified: Secondary | ICD-10-CM | POA: Diagnosis not present

## 2022-02-08 DIAGNOSIS — I87302 Chronic venous hypertension (idiopathic) without complications of left lower extremity: Secondary | ICD-10-CM | POA: Diagnosis not present

## 2022-02-08 DIAGNOSIS — I10 Essential (primary) hypertension: Secondary | ICD-10-CM | POA: Insufficient documentation

## 2022-02-08 DIAGNOSIS — Z87891 Personal history of nicotine dependence: Secondary | ICD-10-CM | POA: Insufficient documentation

## 2022-02-09 ENCOUNTER — Other Ambulatory Visit: Payer: Self-pay | Admitting: Emergency Medicine

## 2022-02-09 NOTE — Progress Notes (Signed)
NIV, DARLEY ([id number]) ?Visit Report for 02/08/2022 ?Chief Complaint Document Details ?Patient Name: Date of Service: ?[name] 02/08/2022 1:15 PM ?Medical Record Number: [id number] ?Patient Account Number: [id number] ?Date of Birth/Sex: Treating RN: ?29-Dec-1945 (76 y.o. [name]) Dellie Catholic ?Primary Care Provider: Agustina Caroli Other Clinician: ?Referring Provider: ?Treating Provider/Extender: Fredirick Maudlin ?Agustina Caroli ?Weeks in Treatment: 4 ?Information Obtained from: Patient ?Chief Complaint ?Right lower extremity wound ?Electronic Signature(s) ?Signed: 02/08/2022 2:40:58 PM By: Fredirick Maudlin MD FACS ?Entered By: Fredirick Maudlin on 02/08/2022 14:40:57 ?-------------------------------------------------------------------------------- ?HPI Details ?Patient Name: Date of Service: ?[name] 02/08/2022 1:15 PM ?Medical Record Number: [id number] ?Patient Account Number: [id number] ?Date of Birth/Sex: Treating RN: ?June 21, 1946 (76 y.o. [name]) Dellie Catholic ?Primary Care Provider: Agustina Caroli Other Clinician: ?Referring Provider: ?Treating Provider/Extender: Fredirick Maudlin ?Agustina Caroli ?Weeks in Treatment: 4 ?History of Present Illness ?HPI Description: Admission 08/23/2021 ?[name] is a 76 year old male with a past medical history of essential hypertension, coronary artery disease and hyperlipidemia that presents to the ?clinic for a wound to his left lower extremity. He states that 2 months ago he scratched the back of his leg creating a small wound that has not healed. He ?states it drains serous fluid constantly. He has been using Neosporin to the area and keeping it covered. He recently saw his primary care physician and was ?recommended compression stockings and an antibiotic. Patient reports improvement in redness with the use of Duricef. He has been wearing his compression ?stockings for the past week. He is also on a diuretic for his hypertension. He denies signs of  infection. ?4/21; patient tolerated the compression wrap well. He has no issues or complaints today. ?READMISSION ?01/10/2022 ?[name] is back with a new venous ulcer on the right calf. He healed fairly quickly from his last left sided wound and says that he has been wearing ?compression stockings, albeit intermittently. He thinks they are 15 to 20 mmHg pressure. Since his last wound care visit, the left-sided wound reopened, but he ?was able to get it closed on his own just using topical Neosporin. He attempted to do the same on the right, but it simply has persisted and his leg is fairly ?swollen and erythematous today. The wounds themselves are scattered and weeping serous fluid, consistent with venous stasis ulcers. ?01/17/2022: The erythema and swelling on the right are improved this week after a course of Augmentin. He has developed new blisters on his posterior left leg ?that he says came up when he started wearing compression stockings, but these are only 15 to 20 mmHg pressure. There is some accumulated eschar on his ?wounds, but no significant slough. We have been using silver alginate and 3 layer compression. ?01/24/2022: His wounds are all healed, but his juxta lite stockings did not arrive. ?02/02/2022: His juxta lite stockings arrived but he only had 1 for the right leg; he did not have a second for his left. He ordered 1 from [place] and was wearing ?them bilaterally. Apparently the one from [place] is the one he used on his right leg and he subsequently developed an area of irritation, potentially from ?friction and has reopened new blister/ulcer lesions on his right lower extremity. They are clean but his skin is weeping. ?02/08/2022: His right-sided wounds are nearly healed. They are very small and superficial and very clean. No weeping or drainage from the leg. ?Electronic Signature(s) ?Signed: 02/08/2022 2:41:46 PM By: Fredirick Maudlin MD FACS ?Entered By: Fredirick Maudlin on 02/08/2022  14:41:46 ?-------------------------------------------------------------------------------- ?Physical Exam Details ?  Patient Name: Date of Service: ?[name] 02/08/2022 1:15 PM ?Medical Record Number: [id number] ?Patient Account Number: [id number] ?Date of Birth/Sex: Treating RN: ?05-13-1946 (76 y.o. [name]) Dellie Catholic ?Primary Care Provider: Agustina Caroli Other Clinician: ?Referring Provider: ?Treating Provider/Extender: Fredirick Maudlin ?Agustina Caroli ?Weeks in Treatment: 4 ?Constitutional ?Slightly hypertensive. Bradycardic, asymptomatic.. . . No acute distress. ?Respiratory ?Normal work of breathing on room air. ?Notes ?02/08/2022: The small lesions on his right leg are clean and have contracted quite a bit. There is no significant drainage and no slough accumulation. ?Electronic Signature(s) ?Signed: 02/08/2022 2:43:30 PM By: Fredirick Maudlin MD FACS ?Entered By: Fredirick Maudlin on 02/08/2022 14:43:30 ?-------------------------------------------------------------------------------- ?Physician Orders Details ?Patient Name: Date of Service: ?[name] 02/08/2022 1:15 PM ?Medical Record Number: [id number] ?Patient Account Number: [id number] ?Date of Birth/Sex: Treating RN: ?[birth year] (76 y.o. 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 - Dr. Celine Ahr - Room 3 ?Return A ?Bathing/ Shower/ Hygiene ?May shower with protection but do not get wound  dressing(s) wet. - Ok to use Biomedical engineer, can purchase at [facility], [facility], or [place] ?Edema Control - Lymphedema / SCD / Other ?Elevate legs to the level of the heart or above for 30 minutes daily and/or when sitting, a frequency of: - throughout the day ?Avoid standing for long periods of time. ?Moisturize legs daily. - left leg daily ?Compression stocking or Garment 20-30 mm/Hg pressure to: - left leg daily ?Wound Treatment ?Wound #5 - Lower Leg Wound Laterality: Right, Medial, Posterior ?Cleanser: Soap and Water 1 x Per Week/30 Days ?Discharge Instructions: May shower and wash wound with dial antibacterial soap and water prior to dressing change. ?Peri-Wound Care: Sween Lotion (Moisturizing lotion) 1 x Per Week/30 Days ?Discharge Instructions: Apply moisturizing lotion as directed ?Prim Dressing: KerraCel Ag Gelling Fiber Dressing, 4x5 in (silver alginate) (Generic) 1 x Per Week/30 Days ?ary ?Discharge Instructions: Apply silver alginate to wound bed as instructed ?Compression Wrap: ThreePress (3 layer compression wrap) 1 x Per Week/30 Days ?Discharge Instructions: Apply three layer compression as directed. ?Wound #6 - Lower Leg Wound Laterality: Right, Lateral, Posterior ?Cleanser: Soap and Water 1 x Per Week/30 Days ?Discharge Instructions: May shower and wash wound with dial antibacterial soap and water prior to dressing change. ?Peri-Wound Care: Sween Lotion (Moisturizing lotion) 1 x Per Week/30 Days ?Discharge Instructions: Apply moisturizing lotion as directed ?Prim Dressing: KerraCel Ag Gelling Fiber Dressing, 4x5 in (silver alginate) (Generic) 1 x Per Week/30 Days ?ary ?Discharge Instructions: Apply silver alginate to wound bed as instructed ?Compression Wrap: ThreePress (3 layer compression wrap) 1 x Per Week/30 Days ?Discharge Instructions: Apply three layer compression as directed. ?Electronic Signature(s) ?Signed: 02/08/2022 4:42:51 PM By: Fredirick Maudlin MD FACS ?Entered By: Fredirick Maudlin on  02/08/2022 14:43:48 ?-------------------------------------------------------------------------------- ?Problem List Details ?Patient Name: ?Date of Service: ?LANDRY, KAMATH Rich 02/08/2022 1:15 PM ?Medical Record Number: [id number] ?Patie

## 2022-02-09 NOTE — Progress Notes (Signed)
[name], [name] ([id number]) ?Visit Report for 02/08/2022 ?Arrival Information Details ?Patient Name: Date of Service: ?[name], [name] 02/08/2022 1:15 PM ?Medical Record Number: [id number] ?Patient Account Number: [id number] ?Date of Birth/Sex: Treating RN: ?January 31, 1946 (76 y.o. [name]) Dellie Catholic ?Primary Care Darwin Guastella: Agustina Caroli Other Clinician: ?Referring Demetrie Borge: ?Treating Lashaye Fisk/Extender: Fredirick Maudlin ?Agustina Caroli ?Weeks in Treatment: 4 ?Visit Information History Since Last Visit ?Added or deleted any medications: No ?Patient Arrived: [name] ?Any new allergies or adverse reactions: No ?Arrival Time: 13:46 ?Had a fall or experienced change in No ?Accompanied By: wife ?activities of daily living that may affect ?Transfer Assistance: None ?risk of falls: ?Patient Identification Verified: Yes ?Signs or symptoms of abuse/neglect since last visito No ?Secondary Verification Process Completed: Yes ?Hospitalized since last visit: No ?Patient Requires Transmission-Based Precautions: No ?Implantable device outside of the clinic excluding No ?Patient Has Alerts: Yes ?cellular tissue based products placed in the center ?Patient Alerts: Patient on Blood Thinner since last visit: ?Xarelto Has Dressing in Place as Prescribed: Yes ?Pain Present Now: No ?Electronic Signature(s) ?Signed: 02/09/2022 8:30:04 AM By: Sandre Kitty ?Entered By: Sandre Kitty on 02/08/2022 13:47:05 ?-------------------------------------------------------------------------------- ?Compression Therapy Details ?Patient Name: Date of Service: ?[name], [name] 02/08/2022 1:15 PM ?Medical Record Number: [id number] ?Patient Account Number: [id number] ?Date of Birth/Sex: Treating RN: ?11-12-1945 (76 y.o. [name]) Dellie Catholic ?Primary Care Algie Cales: Agustina Caroli Other Clinician: ?Referring Tellis Spivak: ?Treating Yamilex Borgwardt/Extender: Fredirick Maudlin ?Agustina Caroli ?Weeks in Treatment: 4 ?Compression Therapy Performed for Wound Assessment: Wound #5  Right,Medial,Posterior Lower Leg ?Performed By: Clinician Dellie Catholic, RN ?Compression Type: Three Layer ?Post Procedure Diagnosis ?Same as Pre-procedure ?Electronic Signature(s) ?Signed: 02/08/2022 6:15:58 PM By: Dellie Catholic RN ?Entered By: Dellie Catholic on 02/08/2022 14:33:53 ?-------------------------------------------------------------------------------- ?Compression Therapy Details ?Patient Name: ?Date of Service: ?[name], [name] 02/08/2022 1:15 PM ?Medical Record Number: [id number] ?Patient Account Number: [id number] ?Date of Birth/Sex: ?Treating RN: ?03-02-1946 (76 y.o. [name]) Dellie Catholic ?Primary Care Graylon Amory: Agustina Caroli ?Other Clinician: ?Referring Berda Shelvin: ?Treating Nivedita Mirabella/Extender: Fredirick Maudlin ?Agustina Caroli ?Weeks in Treatment: 4 ?Compression Therapy Performed for Wound Assessment: Wound #6 Right,Lateral,Posterior Lower Leg ?Performed By: Clinician Dellie Catholic, RN ?Compression Type: Three Layer ?Post Procedure Diagnosis ?Same as Pre-procedure ?Electronic Signature(s) ?Signed: 02/08/2022 6:15:58 PM By: Dellie Catholic RN ?Entered By: Dellie Catholic on 02/08/2022 14:33:53 ?-------------------------------------------------------------------------------- ?Encounter Discharge Information Details ?Patient Name: ?Date of Service: ?[name], [name] 02/08/2022 1:15 PM ?Medical Record Number: [id number] ?Patient Account Number: [id number] ?Date of Birth/Sex: ?Treating RN: ?07-04-1946 (76 y.o. [name]) Dellie Catholic ?Primary Care Leandrew Keech: Agustina Caroli ?Other Clinician: ?Referring Kassadie Pancake: ?Treating Cheronda Erck/Extender: Fredirick Maudlin ?Agustina Caroli ?Weeks in Treatment: 4 ?Encounter Discharge Information Items ?Discharge Condition: Stable ?Ambulatory Status: Ambulatory ?Discharge Destination: Home ?Transportation: Private Auto ?Accompanied By: spouse ?Schedule Follow-up Appointment: Yes ?Clinical Summary of Care: Patient Declined ?Electronic Signature(s) ?Signed: 02/08/2022 6:15:58 PM By:  Dellie Catholic RN ?Entered By: Dellie Catholic on 02/08/2022 18:14:47 ?-------------------------------------------------------------------------------- ?Lower Extremity Assessment Details ?Patient Name: ?Date of Service: ?[name], [name] 02/08/2022 1:15 PM ?Medical Record Number: [id number] ?Patient Account Number: [id number] ?Date of Birth/Sex: ?Treating RN: ?29-Jun-1946 (76 y.o. [name]) Dellie Catholic ?Primary Care Montanna Mcbain: Agustina Caroli ?Other Clinician: ?Referring Rahcel [name]: ?Treating Mar Zettler/Extender: Fredirick Maudlin ?Agustina Caroli ?Weeks in Treatment: 4 ?Edema Assessment ?Assessed: [Left: No] [Right: No] ?Edema: [Left: Ye] [Right: s] ?Calf ?Left: Right: ?Point of Measurement: 43 cm From Medial Instep 44 cm 44 cm ?Ankle ?Left: Right: ?Point of Measurement: 27 cm From Medial Instep 24 cm 24 cm ?Knee To Floor ?Left: Right: ?From Medial Instep 52 cm 52 cm ?Electronic Signature(s) ?Signed:  02/08/2022 6:15:58 PM By: Dellie Catholic RN ?Entered By: Dellie Catholic on 02/08/2022 14:33:33 ?-------------------------------------------------------------------------------- ?Multi Wound Chart Details ?Patient Name: ?Date of Service: ?[name], [name] 02/08/2022 1:15 PM ?Medical Record Number: [id number] ?Patient Account Number: [id number] ?Date of Birth/Sex: ?Treating RN: ?1946-01-20 (76 y.o. [name]) Dellie Catholic ?Primary Care Ginni Eichler: Agustina Caroli ?Other Clinician: ?Referring Birda Didonato: ?Treating Caris Cerveny/Extender: Fredirick Maudlin ?Agustina Caroli ?Weeks in Treatment: 4 ?Vital Signs ?Height(in): ?Pulse(bpm): 56 ?Weight(lbs): ?Blood Pressure(mmHg): 147/88 ?Body Mass Index(BMI): ?Temperature(??F): 98.3 ?Respiratory Rate(breaths/min): 18 ?Photos: [N/A:N/A] ?Right, Medial, Posterior Lower Leg Right, Lateral, Posterior Lower Leg N/A ?Wound Location: ?Blister Blister N/A ?Wounding Event: ?Venous Leg Ulcer Venous Leg Ulcer N/A ?Primary Etiology: ?Coronary Artery Disease, Coronary Artery Disease, N/A ?Comorbid History: ?Hypertension,  Osteoarthritis Hypertension, Osteoarthritis ?01/31/2022 01/31/2022 N/A ?Date Acquired: ?0 0 N/A ?Weeks of Treatment: ?Open Open N/A ?Wound Status: ?No No N/A ?Wound Recurrence: ?1x1.4x0.1 0.1x0.1x0.1 N/A ?Measurements L x W x D (cm) ?1.1 0.008 N/A ?A (cm?) : ?rea ?0.11 0.001 N/A ?Volume (cm?) : ?6.60% 98.90% N/A ?% Reduction in Area: ?6.80% 98.60% N/A ?% Reduction in Volume: ?Full Thickness Without Exposed Full Thickness Without Exposed N/A ?Classification: ?Support Structures Support Structures ?Medium Medium N/A ?Exudate Amount: ?Serosanguineous Serosanguineous N/A ?Exudate Type: ?red, brown red, brown N/A ?Exudate Color: ?Flat and Intact Flat and Intact N/A ?Wound Margin: ?Large (67-100%) Large (67-100%) N/A ?Granulation Amount: ?Red Red N/A ?Granulation Quality: ?None Present (0%) N/A N/A ?Necrotic Amount: ?Fat Layer (Subcutaneous Tissue): Yes Fat Layer (Subcutaneous Tissue): Yes N/A ?Exposed Structures: ?Fascia: No ?Fascia: No ?Tendon: No ?Tendon: No ?Muscle: No ?Muscle: No ?Joint: No ?Joint: No ?Bone: No ?Bone: No ?Small (1-33%) None N/A ?Epithelialization: ?Compression Therapy Compression Therapy N/A ?Procedures Performed: ?Treatment Notes ?Electronic Signature(s) ?Signed: 02/08/2022 2:40:49 PM By: Fredirick Maudlin MD FACS ?Signed: 02/08/2022 6:15:58 PM By: Dellie Catholic RN ?Entered By: Fredirick Maudlin on 02/08/2022 14:40:49 ?-------------------------------------------------------------------------------- ?Multi-Disciplinary Care Plan Details ?Patient Name: ?Date of Service: ?[name], [name] 02/08/2022 1:15 PM ?Medical Record Number: [id number] ?Patient Account Number: [id number] ?Date of Birth/Sex: ?Treating RN: ?11-19-1945 (76 y.o. [name]) Dellie Catholic ?Primary Care Seham Gardenhire: Agustina Caroli ?Other Clinician: ?Referring Cathryne Mancebo: ?Treating Lauralei Clouse/Extender: Fredirick Maudlin ?Agustina Caroli ?Weeks in Treatment: 4 ?Multidisciplinary Care Plan reviewed with physician ?Active Inactive ?Venous Leg Ulcer ?Nursing  Diagnoses: ?Knowledge deficit related to disease process and management ?Goals: ?Patient will maintain optimal edema control ?Date Initiated: 01/10/2022 ?Target Resolution Date: 03/02/2022 ?Goal Status: Active ?

## 2022-02-15 ENCOUNTER — Encounter (HOSPITAL_BASED_OUTPATIENT_CLINIC_OR_DEPARTMENT_OTHER): Payer: Medicare Other | Admitting: General Surgery

## 2022-02-15 DIAGNOSIS — I10 Essential (primary) hypertension: Secondary | ICD-10-CM | POA: Diagnosis not present

## 2022-02-15 DIAGNOSIS — L97211 Non-pressure chronic ulcer of right calf limited to breakdown of skin: Secondary | ICD-10-CM | POA: Diagnosis not present

## 2022-02-15 NOTE — Progress Notes (Signed)
MARLAND, REINE ([id number]) ?Visit Report for 02/02/2022 ?Chief Complaint Document Details ?Patient Name: Date of Service: ?SCHAWN, BYAS RLES 02/02/2022 12:30 PM ?Medical Record Number: [id number] ?Patient Account Number: [id number] ?Date of Birth/Sex: Treating RN: ?June 08, 1946 (76 y.o. M) ?Primary Care Provider: Agustina Caroli Other Clinician: ?Referring Provider: ?Treating Provider/Extender: Fredirick Maudlin ?Agustina Caroli ?Weeks in Treatment: 3 ?Information Obtained from: Patient ?Chief Complaint ?Right lower extremity wound ?Electronic Signature(s) ?Signed: 02/02/2022 1:57:27 PM By: Fredirick Maudlin MD FACS ?Entered By: Fredirick Maudlin on 02/02/2022 13:57:27 ?-------------------------------------------------------------------------------- ?HPI Details ?Patient Name: Date of Service: ?BRITNEY, CAPTAIN RLES 02/02/2022 12:30 PM ?Medical Record Number: [id number] ?Patient Account Number: [id number] ?Date of Birth/Sex: Treating RN: ?[birth year] (76 y.o. M) ?Primary Care Provider: Agustina Caroli Other Clinician: ?Referring Provider: ?Treating Provider/Extender: Fredirick Maudlin ?Agustina Caroli ?Weeks in Treatment: 3 ?History of Present Illness ?HPI Description: Admission 08/23/2021 ?Mr. Decker Cogdell is a 76 year old male with a past medical history of essential hypertension, coronary artery disease and hyperlipidemia that presents to the ?clinic for a wound to his left lower extremity. He states that 2 months ago he scratched the back of his leg creating a small wound that has not healed. He ?states it drains serous fluid constantly. He has been using Neosporin to the area and keeping it covered. He recently saw his primary care physician and was ?recommended compression stockings and an antibiotic. Patient reports improvement in redness with the use of Duricef. He has been wearing his compression ?stockings for the past week. He is also on a diuretic for his hypertension. He denies signs of infection. ?4/21; patient  tolerated the compression wrap well. He has no issues or complaints today. ?READMISSION ?01/10/2022 ?Mr. Boquet is back with a new venous ulcer on the right calf. He healed fairly quickly from his last left sided wound and says that he has been wearing ?compression stockings, albeit intermittently. He thinks they are 15 to 20 mmHg pressure. Since his last wound care visit, the left-sided wound reopened, but he ?was able to get it closed on his own just using topical Neosporin. He attempted to do the same on the right, but it simply has persisted and his leg is fairly ?swollen and erythematous today. The wounds themselves are scattered and weeping serous fluid, consistent with venous stasis ulcers. ?01/17/2022: The erythema and swelling on the right are improved this week after a course of Augmentin. He has developed new blisters on his posterior left leg ?that he says came up when he started wearing compression stockings, but these are only 15 to 20 mmHg pressure. There is some accumulated eschar on his ?wounds, but no significant slough. We have been using silver alginate and 3 layer compression. ?01/24/2022: His wounds are all healed, but his juxta lite stockings did not arrive. ?02/02/2022: His juxta lite stockings arrived but he only had 1 for the right leg; he did not have a second for his left. He ordered 1 from [place] and was wearing ?them bilaterally. Apparently the one from [place] is the one he used on his right leg and he subsequently developed an area of irritation, potentially from ?friction and has reopened new blister/ulcer lesions on his right lower extremity. They are clean but his skin is weeping. ?Electronic Signature(s) ?Signed: 02/02/2022 1:59:21 PM By: Fredirick Maudlin MD FACS ?Signed: 02/02/2022 1:59:21 PM By: Fredirick Maudlin MD FACS ?Entered By: Fredirick Maudlin on 02/02/2022 13:59:20 ?-------------------------------------------------------------------------------- ?Physical Exam Details ?Patient  Name: Date of Service: ?EMELIO, SCHNELLER RLES 02/02/2022 12:30 PM ?Medical Record Number: [id number] ?Patient Account Number:  [id number] ?Date of Birth/Sex: Treating RN: ?Oct 17, 1945 (76 y.o. M) ?Primary Care Provider: Agustina Caroli Other Clinician: ?Referring Provider: ?Treating Provider/Extender: Fredirick Maudlin ?Agustina Caroli ?Weeks in Treatment: 3 ?Constitutional ?He is hypertensive, but asymptomatic.. Bradycardic, asymptomatic.. . . No acute distress. ?Respiratory ?Normal work of breathing on room air. ?Notes ?02/02/2022: He has reopened 2 small lesions on his posterior right calf and there is an adjacent blister that appears likely to break open into a new wound. There ?is no slough or significant exudate, just mild lymphorrhea. ?Electronic Signature(s) ?Signed: 02/02/2022 2:01:50 PM By: Fredirick Maudlin MD FACS ?Entered By: Fredirick Maudlin on 02/02/2022 14:01:49 ?-------------------------------------------------------------------------------- ?Physician Orders Details ?Patient Name: Date of Service: ?TANDY, LEWIN RLES 02/02/2022 12:30 PM ?Medical Record Number: [id number] ?Patient Account Number: [id number] ?Date of Birth/Sex: Treating RN: ?1946-10-07 (76 y.o. 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 - Dr. Celine Ahr - Room 3 ?Return A ?Bathing/ Shower/ Hygiene ?May shower with protection but do not get wound dressing(s) wet. - Ok to use Biomedical engineer, can  purchase at [facility], [facility], or [place] ?Edema Control - Lymphedema / SCD / Other ?Elevate legs to the level of the heart or above for 30 minutes daily and/or when sitting, a frequency of: - throughout the day ?Avoid standing for long periods of time. ?Moisturize legs daily. - left leg daily ?Compression stocking or Garment 20-30 mm/Hg pressure to: - left leg daily ?Wound Treatment ?Wound #5 - Lower Leg Wound Laterality: Right, Medial, Posterior ?Cleanser: Soap and Water 1 x Per Week/30 Days ?Discharge Instructions: May shower and wash wound with dial antibacterial soap and water prior to dressing change. ?Peri-Wound Care: Sween Lotion (Moisturizing lotion) 1 x Per Week/30 Days ?Discharge Instructions: Apply moisturizing lotion as directed ?Prim Dressing: KerraCel Ag Gelling Fiber Dressing, 4x5 in (silver alginate) (DME) (Generic) 1 x Per Week/30 Days ?ary ?Discharge Instructions: Apply silver alginate to wound bed as instructed ?Compression Wrap: ThreePress (3 layer compression wrap) 1 x Per Week/30 Days ?Discharge Instructions: Apply three layer compression as directed. ?Wound #6 - Lower Leg Wound Laterality: Right, Lateral, Posterior ?Cleanser: Soap and Water 1 x Per Week/30 Days ?Discharge Instructions: May shower and wash wound with dial antibacterial soap and water prior to dressing change. ?Peri-Wound Care: Sween Lotion (Moisturizing lotion) 1 x Per Week/30 Days ?Discharge Instructions: Apply moisturizing lotion as directed ?Prim Dressing: KerraCel Ag Gelling Fiber Dressing, 4x5 in (silver alginate) (DME) (Generic) 1 x Per Week/30 Days ?ary ?Discharge Instructions: Apply silver alginate to wound bed as instructed ?Compression Wrap: ThreePress (3 layer compression wrap) 1 x Per Week/30 Days ?Discharge Instructions: Apply three layer compression as directed. ?Electronic Signature(s) ?Signed: 02/02/2022 5:05:56 PM By: Fredirick Maudlin MD FACS ?Entered By: Fredirick Maudlin on 02/02/2022  14:02:17 ?-------------------------------------------------------------------------------- ?Problem List Details ?Patient Name: ?Date of Service: ?CONSUELO, THAYNE RLES 02/02/2022 12:30 PM ?Medical Record Number: [id number] ?Patient Account Numb

## 2022-02-15 NOTE — Progress Notes (Signed)
[name], [name] ([id number]) ?Visit Report for 02/02/2022 ?Arrival Information Details ?Patient Name: Date of Service: ?[name], [name] 02/02/2022 12:30 PM ?Medical Record Number: [id number] ?Patient Account Number: [id number] ?Date of Birth/Sex: Treating RN: ?[birth year] (76 y.o. [name] ?Primary Care Jemario Poitras: Agustina Caroli Other Clinician: ?Referring Alandis Bluemel: ?Treating Hiep Ollis/Extender: Fredirick Maudlin ?Agustina Caroli ?Weeks in Treatment: 3 ?Visit Information History Since Last Visit ?All ordered tests and consults were completed: No ?Patient Arrived: Ambulatory ?Added or deleted any medications: No ?Arrival Time: 13:05 ?Any new allergies or adverse reactions: No ?Accompanied By: wife ?Had a fall or experienced change in No ?Transfer Assistance: None ?activities of daily living that may affect ?Patient Identification Verified: Yes ?risk of falls: ?Secondary Verification Process Completed: Yes ?Signs or symptoms of abuse/neglect since last visito No ?Patient Requires Transmission-Based Precautions: No ?Hospitalized since last visit: No ?Patient Has Alerts: Yes ?Implantable device outside of the clinic excluding No ?Patient Alerts: Patient on Blood Thinner cellular tissue based products placed in the center ?Xarelto since last visit: ?Has Dressing in Place as Prescribed: Yes ?Pain Present Now: No ?Electronic Signature(s) ?Signed: 02/15/2022 8:25:23 AM By: Sharyn Creamer RN, BSN ?Entered By: Sharyn Creamer on 02/02/2022 13:06:11 ?-------------------------------------------------------------------------------- ?Clinic Level of Care Assessment Details ?Patient Name: Date of Service: ?[name], [name] 02/02/2022 12:30 PM ?Medical Record Number: [id number] ?Patient Account Number: [id number] ?Date of Birth/Sex: Treating RN: ?1946/07/29 (76 y.o. [name] ?Primary Care Armelia Penton: Agustina Caroli Other Clinician: ?Referring Syd Newsome: ?Treating Jihad Brownlow/Extender: Fredirick Maudlin ?Agustina Caroli ?Weeks in  Treatment: 3 ?Clinic Level of Care Assessment Items ?TOOL 4 Quantity Score ?X- 1 0 ?Use when only an EandM is performed on FOLLOW-UP visit ?ASSESSMENTS - Nursing Assessment / Reassessment ?X- 1 10 ?Reassessment of Co-morbidities (includes updates in patient status) ?X- 1 5 ?Reassessment of Adherence to Treatment Plan ?ASSESSMENTS - Wound and Skin A ssessment / Reassessment ?'[]'$  - 0 ?Simple Wound Assessment / Reassessment - one wound ?X- 2 5 ?Complex Wound Assessment / Reassessment - multiple wounds ?'[]'$  - 0 ?Dermatologic / Skin Assessment (not related to wound area) ?ASSESSMENTS - Focused Assessment ?X- 1 5 ?Circumferential Edema Measurements - multi extremities ?'[]'$  - 0 ?Nutritional Assessment / Counseling / Intervention ?X- 1 5 ?Lower Extremity Assessment (monofilament, tuning fork, pulses) ?'[]'$  - 0 ?Peripheral Arterial Disease Assessment (using hand held doppler) ?ASSESSMENTS - Ostomy and/or Continence Assessment and Care ?'[]'$  - 0 ?Incontinence Assessment and Management ?'[]'$  - 0 ?Ostomy Care Assessment and Management (repouching, etc.) ?PROCESS - Coordination of Care ?'[]'$  - 0 ?Simple Patient / Family Education for ongoing care ?X- 1 20 ?Complex (extensive) Patient / Family Education for ongoing care ?X- 1 10 ?Staff obtains Consents, Records, T Results / Process Orders ?est ?'[]'$  - 0 ?Staff telephones HHA, Nursing Homes / Clarify orders / etc ?'[]'$  - 0 ?Routine Transfer to another Facility (non-emergent condition) ?'[]'$  - 0 ?Routine Hospital Admission (non-emergent condition) ?'[]'$  - 0 ?New Admissions / Biomedical engineer / Ordering NPWT Apligraf, etc. ?, ?'[]'$  - 0 ?Emergency Hospital Admission (emergent condition) ?'[]'$  - 0 ?Simple Discharge Coordination ?'[]'$  - 0 ?Complex (extensive) Discharge Coordination ?PROCESS - Special Needs ?'[]'$  - 0 ?Pediatric / Minor Patient Management ?'[]'$  - 0 ?Isolation Patient Management ?'[]'$  - 0 ?Hearing / Language / Visual special needs ?'[]'$  - 0 ?Assessment of Community assistance (transportation, D/C  planning, etc.) ?'[]'$  - 0 ?Additional assistance / Altered mentation ?'[]'$  - 0 ?Support Surface(s) Assessment (bed, cushion, seat, etc.) ?INTERVENTIONS - Wound Cleansing / Measurement ?'[]'$  - 0 ?Simple Wound Cleansing - one wound ?  X- 2 5 ?Complex Wound Cleansing - multiple wounds ?X- 1 5 ?Wound Imaging (photographs - any number of wounds) ?'[]'$  - 0 ?Wound Tracing (instead of photographs) ?'[]'$  - 0 ?Simple Wound Measurement - one wound ?X- 1 5 ?Complex Wound Measurement - multiple wounds ?INTERVENTIONS - Wound Dressings ?X - Small Wound Dressing one or multiple wounds 2 10 ?'[]'$  - 0 ?Medium Wound Dressing one or multiple wounds ?'[]'$  - 0 ?Large Wound Dressing one or multiple wounds ?'[]'$  - 0 ?Application of Medications - topical ?'[]'$  - 0 ?Application of Medications - injection ?INTERVENTIONS - Miscellaneous ?'[]'$  - 0 ?External ear exam ?'[]'$  - 0 ?Specimen Collection (cultures, biopsies, blood, body fluids, etc.) ?'[]'$  - 0 ?Specimen(s) / Culture(s) sent or taken to Lab for analysis ?'[]'$  - 0 ?Patient Transfer (multiple staff / Civil Service fast streamer / Similar devices) ?'[]'$  - 0 ?Simple Staple / Suture removal (25 or less) ?'[]'$  - 0 ?Complex Staple / Suture removal (26 or more) ?'[]'$  - 0 ?Hypo / Hyperglycemic Management (close monitor of Blood Glucose) ?'[]'$  - 0 ?Ankle / Brachial Index (ABI) - do not check if billed separately ?X- 1 5 ?Vital Signs ?Has the patient been seen at the hospital within the last three years: Yes ?Total Score: 110 ?Level Of Care: New/Established - Level 3 ?Electronic Signature(s) ?Signed: 02/15/2022 8:25:23 AM By: Sharyn Creamer RN, BSN ?Entered By: Sharyn Creamer on 02/02/2022 16:11:34 ?-------------------------------------------------------------------------------- ?Compression Therapy Details ?Patient Name: Date of Service: ?[name], [name] 02/02/2022 12:30 PM ?Medical Record Number: [id number] ?Patient Account Number: [id number] ?Date of Birth/Sex: Treating RN: ?10/07/1946 (77 y.o. [name] ?Primary Care Lyndel Sarate: Agustina Caroli Other Clinician: ?Referring Lecretia Buczek: ?Treating Tari Lecount/Extender: Fredirick Maudlin ?Agustina Caroli ?Weeks in Treatment: 3 ?Compression Therapy Performed for Wound Assessment: Wound #5 Right,Medial,Posterior Lower Leg ?Performed By: Clinician Sharyn Creamer, RN ?Compression Type: Three Layer ?Post Procedure Diagnosis ?Same as Pre-procedure ?Electronic Signature(s) ?Signed: 02/15/2022 8:25:23 AM By: Sharyn Creamer RN, BSN ?Entered By: Sharyn Creamer on 02/02/2022 16:08:50 ?-------------------------------------------------------------------------------- ?Encounter Discharge Information Details ?Patient Name: Date of Service: ?[name], [name] 02/02/2022 12:30 PM ?Medical Record Number: [id number] ?Patient Account Number: [id number] ?Date of Birth/Sex: Treating RN: ?[birth year] (76 y.o. [name] ?Primary Care Solae Norling: Agustina Caroli Other Clinician: ?Referring Amire Leazer: ?Treating Julianah Marciel/Extender: Fredirick Maudlin ?Agustina Caroli ?Weeks in Treatment: 3 ?Encounter Discharge Information Items ?Discharge Condition: Stable ?Ambulatory Status: Ambulatory ?Discharge Destination: Home ?Transportation: Private Auto ?Accompanied By: wife ?Schedule Follow-up Appointment: Yes ?Clinical Summary of Care: Patient Declined ?Electronic Signature(s) ?Signed: 02/15/2022 8:25:23 AM By: Sharyn Creamer RN, BSN ?Entered By: Sharyn Creamer on 02/02/2022 16:14:16 ?-------------------------------------------------------------------------------- ?Lower Extremity Assessment Details ?Patient Name: ?Date of Service: ?[name], [name] 02/02/2022 12:30 PM ?Medical Record Number: [id number] ?Patient Account Number: [id number] ?Date of Birth/Sex: ?Treating RN: ?06-Jun-1946 (76 y.o. [name] ?Primary Care Mariah Gerstenberger: Agustina Caroli ?Other Clinician: ?Referring Sharman Garrott: ?Treating Embrie Mikkelsen/Extender: Fredirick Maudlin ?Agustina Caroli ?Weeks in Treatment: 3 ?Edema Assessment ?Assessed: [Left: No] [Right: No] ?Edema: [Left: Ye] [Right:  s] ?Calf ?Left: Right: ?Point of Measurement: 43 cm From Medial Instep 46.6 cm ?Ankle ?Left: Right: ?Point of Measurement: 27 cm From Medial Instep 25.7 cm ?Vascular Assessment ?Pulses: ?Dorsalis Pedis ?Pa

## 2022-02-15 NOTE — Progress Notes (Signed)
Bryan, Rich ([id number]) ?Visit Report for 02/15/2022 ?Chief Complaint Document Details ?Patient Name: Date of Service: ?Bryan, MADDOCKS Rich 02/15/2022 2:30 PM ?Medical Record Number: [id number] ?Patient Account Number: [id number] ?Date of Birth/Sex: Treating RN: ?1946/01/12 (76 y.o. [name]) Dellie Catholic ?Primary Care Provider: Agustina Caroli Other Clinician: ?Referring Provider: ?Treating Provider/Extender: Fredirick Maudlin ?Agustina Caroli ?Weeks in Treatment: 5 ?Information Obtained from: Patient ?Chief Complaint ?Right lower extremity wound ?Electronic Signature(s) ?Signed: 02/15/2022 3:25:08 PM By: Fredirick Maudlin MD FACS ?Entered By: Fredirick Maudlin on 02/15/2022 15:25:07 ?-------------------------------------------------------------------------------- ?HPI Details ?Patient Name: Date of Service: ?Bryan, DOBERSTEIN Rich 02/15/2022 2:30 PM ?Medical Record Number: [id number] ?Patient Account Number: [id number] ?Date of Birth/Sex: Treating RN: ?[birth year] (76 y.o. [name]) Dellie Catholic ?Primary Care Provider: Agustina Caroli Other Clinician: ?Referring Provider: ?Treating Provider/Extender: Fredirick Maudlin ?Agustina Caroli ?Weeks in Treatment: 5 ?History of Present Illness ?HPI Description: Admission 08/23/2021 ?[name] is a 76 year old male with a past medical history of essential hypertension, coronary artery disease and hyperlipidemia that presents to the ?clinic for a wound to his left lower extremity. He states that 2 months ago he scratched the back of his leg creating a small wound that has not healed. He ?states it drains serous fluid constantly. He has been using Neosporin to the area and keeping it covered. He recently saw his primary care physician and was ?recommended compression stockings and an antibiotic. Patient reports improvement in redness with the use of Duricef. He has been wearing his compression ?stockings for the past week. He is also on a diuretic for his hypertension. He denies signs of  infection. ?4/21; patient tolerated the compression wrap well. He has no issues or complaints today. ?READMISSION ?01/10/2022 ?[name] is back with a new venous ulcer on the right calf. He healed fairly quickly from his last left sided wound and says that he has been wearing ?compression stockings, albeit intermittently. He thinks they are 15 to 20 mmHg pressure. Since his last wound care visit, the left-sided wound reopened, but he ?was able to get it closed on his own just using topical Neosporin. He attempted to do the same on the right, but it simply has persisted and his leg is fairly ?swollen and erythematous today. The wounds themselves are scattered and weeping serous fluid, consistent with venous stasis ulcers. ?01/17/2022: The erythema and swelling on the right are improved this week after a course of Augmentin. He has developed new blisters on his posterior left leg ?that he says came up when he started wearing compression stockings, but these are only 15 to 20 mmHg pressure. There is some accumulated eschar on his ?wounds, but no significant slough. We have been using silver alginate and 3 layer compression. ?01/24/2022: His wounds are all healed, but his juxta lite stockings did not arrive. ?02/02/2022: His juxta lite stockings arrived but he only had 1 for the right leg; he did not have a second for his left. He ordered 1 from [place] and was wearing ?them bilaterally. Apparently the one from [place] is the one he used on his right leg and he subsequently developed an area of irritation, potentially from ?friction and has reopened new blister/ulcer lesions on his right lower extremity. They are clean but his skin is weeping. ?02/08/2022: His right-sided wounds are nearly healed. They are very small and superficial and very clean. No weeping or drainage from the leg. ?02/15/2022: His wounds are healed. ?Electronic Signature(s) ?Signed: 02/15/2022 3:25:21 PM By: Fredirick Maudlin MD FACS ?Entered By: Fredirick Maudlin on 02/15/2022  15:25:21 ?-------------------------------------------------------------------------------- ?Physical Exam Details ?Patient Name: Date of Service: ?Bryan, CRIADO Rich 02/15/2022 2:30 PM ?Medical Record Number: [id number] ?Patient Account Number: [id number] ?Date of Birth/Sex: Treating RN: ?Sep 24, 1946 (77 y.o. [name]) Dellie Catholic ?Primary Care Provider: Agustina Caroli Other Clinician: ?Referring Provider: ?Treating Provider/Extender: Fredirick Maudlin ?Agustina Caroli ?Weeks in Treatment: 5 ?Constitutional ?. . . . No acute distress. ?Respiratory ?Normal work of breathing on room air. ?Notes ?02/15/2022: His wounds are healed. ?Electronic Signature(s) ?Signed: 02/15/2022 3:25:50 PM By: Fredirick Maudlin MD FACS ?Entered By: Fredirick Maudlin on 02/15/2022 15:25:50 ?-------------------------------------------------------------------------------- ?Physician Orders Details ?Patient Name: Date of Service: ?Bryan, ORE Rich 02/15/2022 2:30 PM ?Medical Record Number: [id number] ?Patient Account Number: [id number] ?Date of Birth/Sex: Treating RN: ?Aug 06, 1946 (76 y.o. 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 Your wounds are healed! ?Electronic Signature(s) ?Signed: 02/15/2022 3:25:59 PM By: Fredirick Maudlin MD FACS ?Entered By: Fredirick Maudlin on 02/15/2022  15:25:59 ?-------------------------------------------------------------------------------- ?Problem List Details ?Patient Name: Date of Service: ?HENRI, BAUMLER Rich 02/15/2022 2:30 PM ?Medical Record Number: [id number] ?Patient Account Number: [id number] ?Date of Birth/Sex: Treating RN: ?October 12, 1945 (76 y.o. [name]) Dellie Catholic ?Primary Care Provider: Agustina Caroli Other Clinician: ?Referring Provider: ?Treating Provider/Extender: Fredirick Maudlin ?Agustina Caroli ?Weeks in Treatment: 5 ?Active Problems ?ICD-10 ?Encounter ?Code Description Active Date MDM ?Diagnosis ?I10 Essential (primary) hypertension 01/10/2022 No Yes ?I25.10 Atherosclerotic heart disease of native coronary artery without angina pectoris 01/10/2022 No Yes ?I87.303 Chronic venous hypertension (idiopathic) without complications of bilateral 01/10/2022 No Yes ?lower extremity ?L97.211 Non-pressure chronic ulcer of right calf limited to breakdown of skin 01/10/2022 No Yes ?Inactive Problems ?Resolved Problems ?Electronic Signature(s) ?Signed: 02/15/2022 3:24:56 PM By: Fredirick Maudlin MD FACS ?Entered By: Fredirick Maudlin on 02/15/2022 15:24:56 ?-------------------------------------------------------------------------------- ?Progress Note Details ?Patient Name: Date of Service: ?RAMOND, DARNELL Rich 02/15/2022 2:30 PM ?Medical Record Number: [id number] ?Patient Account Number: [id number] ?Date of Birth/Sex: Treating RN: ?[birth year] (76 y.o. [name]) Dellie Catholic ?Primary Care Provider: Agustina Caroli Other Clinician: ?Referring Provider: ?Treating Provider/Extender: Fredirick Maudlin ?Agustina Caroli ?Weeks in Treatment: 5 ?Subjective ?Chief Complaint ?Information obtained from Patient ?Right lower extremity wound ?History of Present Illness (HPI) ?Admission 08/23/2021 ?Mr. Tavon Magnussen is a 75 year old male with a past medical history of essential hypertension, coronary artery disease and hyperlipidemia that presents to the ?clinic for a wound to his left lower  extremity. He states that 2 months ago he scratched the back of his leg creating a small wound that has not healed. He ?states it drains serous fluid constantly. He has been using Neosporin to the area and keeping it covered. He recently saw his primary care physician and was ?recommended comp

## 2022-02-16 ENCOUNTER — Ambulatory Visit (INDEPENDENT_AMBULATORY_CARE_PROVIDER_SITE_OTHER): Payer: Medicare Other | Admitting: Emergency Medicine

## 2022-02-16 ENCOUNTER — Encounter: Payer: Self-pay | Admitting: Emergency Medicine

## 2022-02-16 VITALS — BP 130/70 | HR 53 | Temp 98.1°F | Ht 72.0 in | Wt 313.2 lb

## 2022-02-16 DIAGNOSIS — E785 Hyperlipidemia, unspecified: Secondary | ICD-10-CM | POA: Diagnosis not present

## 2022-02-16 DIAGNOSIS — Z86711 Personal history of pulmonary embolism: Secondary | ICD-10-CM

## 2022-02-16 DIAGNOSIS — Z7901 Long term (current) use of anticoagulants: Secondary | ICD-10-CM | POA: Diagnosis not present

## 2022-02-16 DIAGNOSIS — I10 Essential (primary) hypertension: Secondary | ICD-10-CM | POA: Diagnosis not present

## 2022-02-16 NOTE — Assessment & Plan Note (Signed)
Stable.  Diet and nutrition discussed.  Continue rosuvastatin 20 mg daily.  

## 2022-02-16 NOTE — Assessment & Plan Note (Signed)
On long-term anticoagulation with Xarelto. ?

## 2022-02-16 NOTE — Assessment & Plan Note (Addendum)
Inquiring about generic and high cost of Xarelto. ?Continue Xarelto 20 mg daily for life. ?Fall precautions given. ?

## 2022-02-16 NOTE — Progress Notes (Signed)
[name] ?76 y.o. ? ? ?Chief Complaint  ?Patient presents with  ? Follow-up  ? Medication Management  ?  Questions about generic form of xarelto  ? Leg Swelling  ? ? ?HISTORY OF PRESENT ILLNESS: ?This is a 76 y.o. male A1A here for follow-up of chronic medical problems. ?Doing well. ?Has no complaints or medical concerns today. ?Recently completed treatment for lower extremity open wound at the wound care center.  Did well.  Healing well. ? ?HPI ? ? ?Prior to Admission medications   ?Medication Sig Start Date End Date Taking? Authorizing Provider  ?acetaminophen (TYLENOL) 325 MG tablet Take 2 tablets (650 mg total) by mouth every 6 (six) hours as needed for mild pain (or Fever >/= 101). 08/24/19  Yes Roxan Hockey, MD  ?ALPRAZolam Duanne Moron) 0.5 MG tablet Take 1 tablet (0.5 mg total) by mouth daily as needed for anxiety. 08/18/21  Yes Sanah Kraska, Ines Bloomer, MD  ?Docusate Sodium (COLACE PO) Take by mouth daily.   Yes [provider]  ?furosemide (LASIX) 40 MG tablet TAKE 1 TABLET (40 MG TOTAL) BY MOUTH DAILY. TAKE 1/2 TAB FOR 5 DAYS BEFORE TAKING WHOLE TAB. 01/19/22  Yes Carolle Ishii, Ines Bloomer, MD  ?lisinopril (ZESTRIL) 10 MG tablet Take 1 tablet (10 mg total) by mouth daily. 08/18/21  Yes Braya Habermehl, Ines Bloomer, MD  ?pantoprazole (PROTONIX) 40 MG tablet Take 1 tablet (40 mg total) by mouth daily. 08/18/21 08/18/22 Yes Salvatore Poe, Ines Bloomer, MD  ?rosuvastatin (CRESTOR) 20 MG tablet Take 1 tablet (20 mg total) by mouth daily. 08/18/21  Yes Yoseph Haile, Ines Bloomer, MD  ?Sennosides (SENOKOT PO) Take by mouth daily.   Yes [provider]  ?rivaroxaban (XARELTO) 20 MG TABS tablet Take 1 tablet (20 mg total) by mouth daily with supper. 08/18/21 11/16/21  Horald Pollen, MD  ? ? ?No Known Allergies ? ?Patient Active Problem List  ? Diagnosis Date Noted  ? Open wound of left lower leg 08/18/2021  ? History of pulmonary embolism 02/25/2020  ? Current use of long term anticoagulation 02/25/2020  ? Pulmonary  nodules/lesions, multiple 02/25/2020  ? 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 CAD in native artery 08/31/2016  ? Essential hypertension 08/31/2016  ? Hyperlipidemia LDL goal <70 08/31/2016  ? Family history of heart disease in male family member before age 94 08/31/2016  ? Hearing loss, bilateral 08/31/2016  ? Generalized anxiety disorder 08/31/2016  ? History of colonic polyps 08/31/2016  ? ? ?Past Medical History:  ?Diagnosis Date  ? Arthritis   ? CAD S/P percutaneous coronary angioplasty 2011  ? stents x 3  ? Generalized anxiety disorder   ? Hard of hearing   ? Hyperlipidemia   ? Hypertension   ? Lower GI bleed 2014  ? colonscopy was normal  ? 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 ? ?Past Surgical History:  ?Procedure Laterality Date  ? CORONARY ANGIOPLASTY WITH STENT PLACEMENT    ? FRACTURE SURGERY  1995  ? right elbow  ? KNEE ARTHROSCOPY W/ MENISCAL REPAIR    ? ? ?Social History  ? ?Socioeconomic History  ? Marital status: Married  ?  Spouse name: rose  ? Number of children: 2  ? Years of education: 18  ? Highest education level: Not on file  ?Occupational History  ? Occupation: retired  ?  Comment: packing/shipping  ?Tobacco Use  ? Smoking status: Former  ?  Packs/day: 0.30  ?  Types: Cigarettes  ?  Start date: 08/31/1966  ?  Quit date: 08/31/1969  ?  Years since quitting: 52.4  ? Smokeless tobacco: Never  ?Vaping Use  ? Vaping Use: Never used  ?Substance and Sexual Activity  ? Alcohol use: Yes  ?  Alcohol/week: 3.0 standard drinks  ?  Types: 2 Cans of beer, 1 Shots of liquor per week  ? Drug use: No  ? Sexual activity: Yes  ?Other Topics Concern  ? Not on file  ?Social History Narrative  ? Not on file  ? ?Social Determinants of Health  ? ?Financial Resource Strain: Low Risk   ? Difficulty of Paying Living Expenses: Not hard at all  ?Food Insecurity: No Food Insecurity  ? Worried About Charity fundraiser in the Last Year: Never true  ? Ran Out of Food in the Last Year: Never true   ?Transportation Needs: No Transportation Needs  ? Lack of Transportation (Medical): No  ? Lack of Transportation (Non-Medical): No  ?Physical Activity: Insufficiently Active  ? Days of Exercise per Week: 5 days  ? Minutes of Exercise per Session: 20 min  ?Stress: No Stress Concern Present  ? Feeling of Stress : Only a little  ?Social Connections: Moderately Isolated  ? Frequency of Communication with Friends and Family: Once a week  ? Frequency of Social Gatherings with Friends and Family: More than three times a week  ? Attends Religious Services: Never  ? Active Member of Clubs or Organizations: No  ? Attends Archivist Meetings: Never  ? Marital Status: Married  ?Intimate Partner Violence: Not At Risk  ? Fear of Current or Ex-Partner: No  ? Emotionally Abused: No  ? Physically Abused: No  ? Sexually Abused: No  ? ? ?Family History  ?Problem Relation Age of Onset  ? Heart disease Mother 24  ?     heart attack  ? Lung disease Father   ?     asbestosis  ? Clotting disorder Father   ? Asthma Daughter   ? Hyperlipidemia Daughter   ? Alcohol abuse Paternal Uncle   ? Cancer Paternal Grandfather   ?     lung  ? ? ? ?Review of Systems  ?Constitutional: Negative.  Negative for chills and fever.  ?HENT: Negative.  Negative for congestion and sore throat.   ?Respiratory: Negative.  Negative for cough and shortness of breath.   ?Cardiovascular: Negative.  Negative for chest pain and palpitations.  ?Gastrointestinal:  Negative for abdominal pain, diarrhea, nausea and vomiting.  ?Genitourinary: Negative.  Negative for dysuria.  ?Skin: Negative.  Negative for rash.  ?Neurological:  Negative for dizziness and headaches.  ?All other systems reviewed and are negative. ?Today's Vitals  ? 02/16/22 1057 02/16/22 1102 02/16/22 1436  ?BP: (!) 140/94 (!) 140/96 130/70  ?Pulse: (!) 53    ?Temp: 98.1 ?F (36.7 ?C)    ?TempSrc: Oral    ?SpO2: 96%    ?Weight: (!) 313 lb 4 oz (142.1 kg)    ?Height: 6' (1.829 m)    ? ?Body mass index  is 42.48 kg/m?. ? ? ?Physical Exam ?Vitals reviewed.  ?Constitutional:   ?   Appearance: Normal appearance.  ?HENT:  ?   Head: Normocephalic.  ?Eyes:  ?   Extraocular Movements: Extraocular movements intact.  ?   Conjunctiva/sclera: Conjunctivae normal.  ?   Pupils: Pupils are equal, round, and reactive to light.  ?Cardiovascular:  ?   Rate and Rhythm: Normal rate and regular rhythm.  ?   Pulses: Normal pulses.  ?  Heart sounds: Normal heart sounds.  ?Pulmonary:  ?   Effort: Pulmonary effort is normal.  ?   Breath sounds: Normal breath sounds.  ?Abdominal:  ?   Palpations: Abdomen is soft.  ?   Tenderness: There is no abdominal tenderness.  ?Musculoskeletal:  ?   Cervical back: No tenderness.  ?   Right lower leg: Edema present.  ?   Left lower leg: Edema present.  ?Lymphadenopathy:  ?   Cervical: No cervical adenopathy.  ?Skin: ?   General: Skin is warm and dry.  ?   Capillary Refill: Capillary refill takes less than 2 seconds.  ?Neurological:  ?   General: No focal deficit present.  ?   Mental Status: He is alert and oriented to person, place, and time.  ?Psychiatric:     ?   Mood and Affect: Mood normal.     ?   Behavior: Behavior normal.  ? ? ? ?ASSESSMENT & PLAN: ?Problem List Items Addressed This Visit   ? ?  ? Cardiovascular and Mediastinum  ? Essential hypertension - Primary  ?  Elevated blood pressure reading in the office but normal readings at home. ?Continue lisinopril 10 mg.  Increase dose to 20 mg if readings high at home. ? ?  ?  ?  ? Other  ? Dyslipidemia  ?  Stable.  Diet and nutrition discussed.  Continue rosuvastatin 20 mg daily. ? ?  ?  ? History of pulmonary embolism  ?  On long-term anticoagulation with Xarelto. ? ?  ?  ? Current use of long term anticoagulation  ?  Inquiring about generic and high cost of Xarelto. ?Continue Xarelto 20 mg daily for life. ?Fall precautions given. ? ?  ?  ? ?Patient Instructions  ?Health Maintenance After Age 67 ?After age 74, you are at a higher risk for  certain long-term diseases and infections as well as injuries from falls. Falls are a major cause of broken bones and head injuries in people who are older than age 41. Getting regular preventive care can help to kee

## 2022-02-16 NOTE — Assessment & Plan Note (Signed)
Elevated blood pressure reading in the office but normal readings at home. ?Continue lisinopril 10 mg.  Increase dose to 20 mg if readings high at home. ?

## 2022-02-16 NOTE — Patient Instructions (Signed)
Health Maintenance After Age 74 After age 74, you are at a higher risk for certain long-term diseases and infections as well as injuries from falls. Falls are a major cause of broken bones and head injuries in people who are older than age 74. Getting regular preventive care can help to keep you healthy and well. Preventive care includes getting regular testing and making lifestyle changes as recommended by your health care provider. Talk with your health care provider about: Which screenings and tests you should have. A screening is a test that checks for a disease when you have no symptoms. A diet and exercise plan that is right for you. What should I know about screenings and tests to prevent falls? Screening and testing are the best ways to find a health problem early. Early diagnosis and treatment give you the best chance of managing medical conditions that are common after age 74. Certain conditions and lifestyle choices may make you more likely to have a fall. Your health care provider may recommend: Regular vision checks. Poor vision and conditions such as cataracts can make you more likely to have a fall. If you wear glasses, make sure to get your prescription updated if your vision changes. Medicine review. Work with your health care provider to regularly review all of the medicines you are taking, including over-the-counter medicines. Ask your health care provider about any side effects that may make you more likely to have a fall. Tell your health care provider if any medicines that you take make you feel dizzy or sleepy. Strength and balance checks. Your health care provider may recommend certain tests to check your strength and balance while standing, walking, or changing positions. Foot health exam. Foot pain and numbness, as well as not wearing proper footwear, can make you more likely to have a fall. Screenings, including: Osteoporosis screening. Osteoporosis is a condition that causes  the bones to get weaker and break more easily. Blood pressure screening. Blood pressure changes and medicines to control blood pressure can make you feel dizzy. Depression screening. You may be more likely to have a fall if you have a fear of falling, feel depressed, or feel unable to do activities that you used to do. Alcohol use screening. Using too much alcohol can affect your balance and may make you more likely to have a fall. Follow these instructions at home: Lifestyle Do not drink alcohol if: Your health care provider tells you not to drink. If you drink alcohol: Limit how much you have to: 0-1 drink a day for women. 0-2 drinks a day for men. Know how much alcohol is in your drink. In the [place] one drink equals one 12 oz bottle of beer (355 mL), one 5 oz glass of wine (148 mL), or one 1 oz glass of hard liquor (44 mL). Do not use any products that contain nicotine or tobacco. These products include cigarettes, chewing tobacco, and vaping devices, such as e-cigarettes. If you need help quitting, ask your health care provider. Activity  Follow a regular exercise program to stay fit. This will help you maintain your balance. Ask your health care provider what types of exercise are appropriate for you. If you need a cane or walker, use it as recommended by your health care provider. Wear supportive shoes that have nonskid soles. Safety  Remove any tripping hazards, such as rugs, cords, and clutter. Install safety equipment such as grab bars in bathrooms and safety rails on stairs. Keep rooms and walkways   well-lit. General instructions Talk with your health care provider about your risks for falling. Tell your health care provider if: You fall. Be sure to tell your health care provider about all falls, even ones that seem minor. You feel dizzy, tiredness (fatigue), or off-balance. Take over-the-counter and prescription medicines only as told by your health care provider. These include  supplements. Eat a healthy diet and maintain a healthy weight. A healthy diet includes low-fat dairy products, low-fat (lean) meats, and fiber from whole grains, beans, and lots of fruits and vegetables. Stay current with your vaccines. Schedule regular health, dental, and eye exams. Summary Having a healthy lifestyle and getting preventive care can help to protect your health and wellness after age 74. Screening and testing are the best way to find a health problem early and help you avoid having a fall. Early diagnosis and treatment give you the best chance for managing medical conditions that are more common for people who are older than age 74. Falls are a major cause of broken bones and head injuries in people who are older than age 74. Take precautions to prevent a fall at home. Work with your health care provider to learn what changes you can make to improve your health and wellness and to prevent falls. This information is not intended to replace advice given to you by your health care provider. Make sure you discuss any questions you have with your health care provider. Document Revised: 02/15/2021 Document Reviewed: 02/15/2021 Elsevier Patient Education  2023 [employer]vier Inc[employer]  

## 2022-02-16 NOTE — Progress Notes (Signed)
ARLEN, DUPUIS ([id number]) ?Visit Report for 02/15/2022 ?Arrival Information Details ?Patient Name: Date of Service: ?[name], [name] 02/15/2022 2:30 PM ?Medical Record Number: [id number] ?Patient Account Number: [id number] ?Date of Birth/Sex: Treating RN: ?14-Mar-1946 (76 y.o. [name]) Dellie Catholic ?Primary Care Aya Geisel: Agustina Caroli Other Clinician: ?Referring Leyah Bocchino: ?Treating Inetta Dicke/Extender: Fredirick Maudlin ?Agustina Caroli ?Weeks in Treatment: 5 ?Visit Information History Since Last Visit ?Added or deleted any medications: No ?Patient Arrived: Ambulatory ?Any new allergies or adverse reactions: No ?Arrival Time: 14:28 ?Had a fall or experienced change in No ?Accompanied By: self ?activities of daily living that may affect ?Transfer Assistance: None ?risk of falls: ?Patient Identification Verified: Yes ?Signs or symptoms of abuse/neglect since last visito No ?Secondary Verification Process Completed: Yes ?Hospitalized since last visit: No ?Patient Requires Transmission-Based Precautions: No ?Implantable device outside of the clinic excluding No ?Patient Has Alerts: Yes ?cellular tissue based products placed in the center ?Patient Alerts: Patient on Blood Thinner since last visit: ?Xarelto Has Dressing in Place as Prescribed: Yes ?Pain Present Now: No ?Electronic Signature(s) ?Signed: 02/16/2022 7:47:55 AM By: Sandre Kitty ?Entered By: Sandre Kitty on 02/15/2022 14:28:28 ?-------------------------------------------------------------------------------- ?Encounter Discharge Information Details ?Patient Name: Date of Service: ?[name], [name] 02/15/2022 2:30 PM ?Medical Record Number: [id number] ?Patient Account Number: [id number] ?Date of Birth/Sex: Treating RN: ?June 09, 1946 (76 y.o. [name]) Dellie Catholic ?Primary Care Cairo Lingenfelter: Agustina Caroli Other Clinician: ?Referring Kashawn Manzano: ?Treating Kniyah Khun/Extender: Fredirick Maudlin ?Agustina Caroli ?Weeks in Treatment: 5 ?Encounter Discharge Information  Items ?Discharge Condition: Stable ?Ambulatory Status: Ambulatory ?Discharge Destination: Home ?Transportation: Private Auto ?Accompanied By: self ?Schedule Follow-up Appointment: Yes ?Clinical Summary of Care: Patient Declined ?Electronic Signature(s) ?Signed: 02/15/2022 6:00:15 PM By: Dellie Catholic RN ?Entered By: Dellie Catholic on 02/15/2022 17:58:36 ?-------------------------------------------------------------------------------- ?Lower Extremity Assessment Details ?Patient Name: ?Date of Service: ?[name], [name] 02/15/2022 2:30 PM ?Medical Record Number: [id number] ?Patient Account Number: [id number] ?Date of Birth/Sex: ?Treating RN: ?1946-06-23 (76 y.o. [name]) Dellie Catholic ?Primary Care Metztli Sachdev: Agustina Caroli ?Other Clinician: ?Referring Donnabelle Blanchard: ?Treating Reannah Totten/Extender: Fredirick Maudlin ?Agustina Caroli ?Weeks in Treatment: 5 ?Edema Assessment ?Assessed: [Left: No] [Right: No] ?Edema: [Left: Ye] [Right: s] ?Calf ?Left: Right: ?Point of Measurement: 43 cm From Medial Instep 44 cm 44 cm ?Ankle ?Left: Right: ?Point of Measurement: 27 cm From Medial Instep 24 cm 24 cm ?Electronic Signature(s) ?Signed: 02/15/2022 6:00:15 PM By: Dellie Catholic RN ?Entered By: Dellie Catholic on 02/15/2022 15:17:46 ?-------------------------------------------------------------------------------- ?Multi Wound Chart Details ?Patient Name: ?Date of Service: ?[name], [name] 02/15/2022 2:30 PM ?Medical Record Number: [id number] ?Patient Account Number: [id number] ?Date of Birth/Sex: ?Treating RN: ?13-Feb-1946 (76 y.o. [name]) Dellie Catholic ?Primary Care Jhana Giarratano: Agustina Caroli ?Other Clinician: ?Referring Khari Mally: ?Treating Jonea Bukowski/Extender: Fredirick Maudlin ?Agustina Caroli ?Weeks in Treatment: 5 ?Vital Signs ?Height(in): ?Pulse(bpm): 62 ?Weight(lbs): ?Blood Pressure(mmHg): 132/76 ?Body Mass Index(BMI): ?Temperature(??F): 98.3 ?Respiratory Rate(breaths/min): 18 ?Photos: [N/A:N/A] ?Right, Medial, Posterior Lower Leg Right, Lateral,  Posterior Lower Leg N/A ?Wound Location: ?Blister Blister N/A ?Wounding Event: ?Venous Leg Ulcer Venous Leg Ulcer N/A ?Primary Etiology: ?Coronary Artery Disease, Coronary Artery Disease, N/A ?Comorbid History: ?Hypertension, Osteoarthritis Hypertension, Osteoarthritis ?01/31/2022 01/31/2022 N/A ?Date Acquired: ?1 1 N/A ?Weeks of Treatment: ?Healed - Epithelialized Healed - Epithelialized N/A ?Wound Status: ?No No N/A ?Wound Recurrence: ?0x0x0 0x0x0 N/A ?Measurements L x W x D (cm) ?0 0 N/A ?A (cm?) : ?rea ?0 0 N/A ?Volume (cm?) : ?100.00% 100.00% N/A ?% Reduction in Area: ?100.00% 100.00% N/A ?% Reduction in Volume: ?Full Thickness Without Exposed Full Thickness Without Exposed N/A ?Classification: ?Support Structures Support Structures ?None Present None  Present N/A ?Exudate Amount: ?Flat and Intact Flat and Intact N/A ?Wound Margin: ?None Present (0%) None Present (0%) N/A ?Granulation Amount: ?None Present (0%) None Present (0%) N/A ?Necrotic Amount: ?Fascia: No ?Fascia: No N/A ?Exposed Structures: ?Fat Layer (Subcutaneous Tissue): No ?Fat Layer (Subcutaneous Tissue): No ?Tendon: No ?Tendon: No ?Muscle: No ?Muscle: No ?Joint: No ?Joint: No ?Bone: No ?Bone: No ?Large (67-100%) Large (67-100%) N/A ?Epithelialization: ?Treatment Notes ?Electronic Signature(s) ?Signed: 02/15/2022 3:25:02 PM By: Fredirick Maudlin MD FACS ?Signed: 02/15/2022 6:00:15 PM By: Dellie Catholic RN ?Entered By: Fredirick Maudlin on 02/15/2022 15:25:02 ?-------------------------------------------------------------------------------- ?Multi-Disciplinary Care Plan Details ?Patient Name: ?Date of Service: ?[name], [name] 02/15/2022 2:30 PM ?Medical Record Number: [id number] ?Patient Account Number: [id number] ?Date of Birth/Sex: ?Treating RN: ?[birth year] (76 y.o. [name]) Dellie Catholic ?Primary Care Jadon Harbaugh: Agustina Caroli ?Other Clinician: ?Referring Ishmael Berkovich: ?Treating Juan Kissoon/Extender: Fredirick Maudlin ?Agustina Caroli ?Weeks in Treatment:  5 ?Multidisciplinary Care Plan reviewed with physician ?Active Inactive ?Electronic Signature(s) ?Signed: 02/15/2022 6:00:15 PM By: Dellie Catholic RN ?Entered By: Dellie Catholic on 02/15/2022 17:59:34 ?-------------------------------------------------------------------------------- ?Pain Assessment Details ?Patient Name: ?Date of Service: ?[name], [name] 02/15/2022 2:30 PM ?Medical Record Number: [id number] ?Patient Account Number: [id number] ?Date of Birth/Sex: ?Treating RN: ?May 27, 1946 (76 y.o. [name]) Dellie Catholic ?Primary Care Samin Milke: Agustina Caroli ?Other Clinician: ?Referring Chidubem Chaires: ?Treating Alta Shober/Extender: Fredirick Maudlin ?Agustina Caroli ?Weeks in Treatment: 5 ?Active Problems ?Location of Pain Severity and Description of Pain ?Patient Has Paino No ?Patient Has Paino No ?Site Locations ?Pain Management and Medication ?Current Pain Management: ?Electronic Signature(s) ?Signed: 02/15/2022 6:00:15 PM By: Dellie Catholic RN ?Signed: 02/16/2022 7:47:55 AM By: Sandre Kitty ?Entered By: Sandre Kitty on 02/15/2022 14:29:24 ?-------------------------------------------------------------------------------- ?Patient/Caregiver Education Details ?Patient Name: ?Date of Service: ?[name], [name] 5/9/2023andnbsp2:30 PM ?Medical Record Number: [id number] ?Patient Account Number: [id number] ?Date of Birth/Gender: ?Treating RN: ?July 26, 1946 (76 y.o. [name]) Dellie Catholic ?Primary Care Physician: Agustina Caroli ?Other Clinician: ?Referring Physician: ?Treating Physician/Extender: Fredirick Maudlin ?Agustina Caroli ?Weeks in Treatment: 5 ?Education Assessment ?Education Provided To: ?Patient ?Education Topics Provided ?Wound/Skin Impairment: ?Methods: Explain/Verbal ?Responses: Return demonstration correctly ?Electronic Signature(s) ?Signed: 02/15/2022 6:00:15 PM By: Dellie Catholic RN ?Entered By: Dellie Catholic on 02/15/2022  17:58:08 ?-------------------------------------------------------------------------------- ?Wound Assessment Details ?Patient Name: ?Date of Service: ?[name], [name] 02/15/2022 2:30 PM ?Medical Record Number: [id number] ?Patient Account Number: [id number] ?Date of Birth/Sex: ?Treating RN: ?[birth year] (75 y.o. M) [place],

## 2022-02-22 ENCOUNTER — Encounter (HOSPITAL_BASED_OUTPATIENT_CLINIC_OR_DEPARTMENT_OTHER): Payer: Medicare Other | Admitting: General Surgery

## 2022-02-22 DIAGNOSIS — L97212 Non-pressure chronic ulcer of right calf with fat layer exposed: Secondary | ICD-10-CM | POA: Diagnosis not present

## 2022-02-22 DIAGNOSIS — L97211 Non-pressure chronic ulcer of right calf limited to breakdown of skin: Secondary | ICD-10-CM | POA: Diagnosis not present

## 2022-02-22 DIAGNOSIS — I10 Essential (primary) hypertension: Secondary | ICD-10-CM | POA: Diagnosis not present

## 2022-02-22 DIAGNOSIS — I87303 Chronic venous hypertension (idiopathic) without complications of bilateral lower extremity: Secondary | ICD-10-CM | POA: Diagnosis not present

## 2022-02-22 DIAGNOSIS — I872 Venous insufficiency (chronic) (peripheral): Secondary | ICD-10-CM | POA: Diagnosis not present

## 2022-02-22 DIAGNOSIS — Z87891 Personal history of nicotine dependence: Secondary | ICD-10-CM | POA: Diagnosis not present

## 2022-02-22 DIAGNOSIS — I87311 Chronic venous hypertension (idiopathic) with ulcer of right lower extremity: Secondary | ICD-10-CM | POA: Diagnosis not present

## 2022-02-22 DIAGNOSIS — E785 Hyperlipidemia, unspecified: Secondary | ICD-10-CM | POA: Diagnosis not present

## 2022-02-22 DIAGNOSIS — I2510 Atherosclerotic heart disease of native coronary artery without angina pectoris: Secondary | ICD-10-CM | POA: Diagnosis not present

## 2022-02-22 NOTE — Progress Notes (Signed)
JCEON, ALVERIO ([id number]) ?Visit Report for 02/22/2022 ?Arrival Information Details ?Patient Name: Date of Service: ?OGAN, [name] 02/22/2022 9:30 A M ?Medical Record Number: [id number] ?Patient Account Number: [id number] ?Date of Birth/Sex: Treating RN: ?Jul 14, 1946 (76 y.o. [name] ?Primary Care Naftoli Penny: Agustina Caroli Other Clinician: ?Referring Sherril Heyward: ?Treating Avian Greenawalt/Extender: Fredirick Maudlin ?Agustina Caroli ?Weeks in Treatment: 6 ?Visit Information History Since Last Visit ?Added or deleted any medications: No ?Patient Arrived: Kasandra Knudsen ?Any new allergies or adverse reactions: No ?Arrival Time: 09:20 ?Had a fall or experienced change in No ?Accompanied By: wife ?activities of daily living that may affect ?Transfer Assistance: None ?risk of falls: ?Patient Identification Verified: Yes ?Signs or symptoms of abuse/neglect since last visito No ?Secondary Verification Process Completed: Yes ?Hospitalized since last visit: No ?Patient Requires Transmission-Based Precautions: No ?Implantable device outside of the clinic excluding No ?Patient Has Alerts: Yes ?cellular tissue based products placed in the center ?Patient Alerts: Patient on Blood Thinner since last visit: ?Xarelto Pain Present Now: No ?Electronic Signature(s) ?Signed: 02/22/2022 5:45:24 PM By: Levan Hurst RN, BSN ?Entered By: Levan Hurst on 02/22/2022 09:20:48 ?-------------------------------------------------------------------------------- ?Compression Therapy Details ?Patient Name: Date of Service: ?DUBOIS, [name] 02/22/2022 9:30 A M ?Medical Record Number: [id number] ?Patient Account Number: [id number] ?Date of Birth/Sex: Treating RN: ?[birth year] (76 y.o. [name]) Dellie Catholic ?Primary Care Reno Clasby: Agustina Caroli Other Clinician: ?Referring Ladaija Dimino: ?Treating Markea Ruzich/Extender: Fredirick Maudlin ?Agustina Caroli ?Weeks in Treatment: 6 ?Compression Therapy Performed for Wound Assessment: Wound #7 Right,Posterior Lower  Leg ?Performed By: Clinician Dellie Catholic, RN ?Compression Type: Three Layer ?Post Procedure Diagnosis ?Same as Pre-procedure ?Electronic Signature(s) ?Signed: 02/22/2022 5:31:41 PM By: Dellie Catholic RN ?Entered By: Dellie Catholic on 02/22/2022 17:29:48 ?-------------------------------------------------------------------------------- ?Encounter Discharge Information Details ?Patient Name: ?Date of Service: ?RIJOS, [name] 02/22/2022 9:30 A M ?Medical Record Number: [id number] ?Patient Account Number: [id number] ?Date of Birth/Sex: ?Treating RN: ?[birth year] (76 y.o. [name]) Dellie Catholic ?Primary Care Joriel Streety: Agustina Caroli ?Other Clinician: ?Referring Masaye Gatchalian: ?Treating Vester Balthazor/Extender: Fredirick Maudlin ?Agustina Caroli ?Weeks in Treatment: 6 ?Encounter Discharge Information Items Post Procedure Vitals ?Discharge Condition: Stable ?Temperature (F): 98.8 ?Ambulatory Status: Ambulatory ?Pulse (bpm): 65 ?Discharge Destination: Home ?Respiratory Rate (breaths/min): 18 ?Transportation: Private Auto ?Blood Pressure (mmHg): 124/75 ?Accompanied By: spouse ?Schedule Follow-up Appointment: Yes ?Clinical Summary of Care: Patient Declined ?Electronic Signature(s) ?Signed: 02/22/2022 5:31:41 PM By: Dellie Catholic RN ?Entered By: Dellie Catholic on 02/22/2022 17:30:34 ?-------------------------------------------------------------------------------- ?Lower Extremity Assessment Details ?Patient Name: ?Date of Service: ?HENDON, [name] 02/22/2022 9:30 A M ?Medical Record Number: [id number] ?Patient Account Number: [id number] ?Date of Birth/Sex: ?Treating RN: ?[birth year] (76 y.o. [name] ?Primary Care Lorrie Strauch: Agustina Caroli ?Other Clinician: ?Referring Alpa Salvo: ?Treating Bradleigh Sonnen/Extender: Fredirick Maudlin ?Agustina Caroli ?Weeks in Treatment: 6 ?Edema Assessment ?Assessed: [Left: No] [Right: No] ?Edema: [Left: Ye] [Right: s] ?Calf ?Left: Right: ?Point of Measurement: 43 cm From Medial Instep 44 cm 46  cm ?Ankle ?Left: Right: ?Point of Measurement: 27 cm From Medial Instep 24 cm 26.4 cm ?Vascular Assessment ?Pulses: ?Dorsalis Pedis ?Palpable: [Right:Yes] ?Electronic Signature(s) ?Signed: 02/22/2022 5:45:24 PM By: Levan Hurst RN, BSN ?Entered By: Levan Hurst on 02/22/2022 09:23:33 ?-------------------------------------------------------------------------------- ?Multi Wound Chart Details ?Patient Name: ?Date of Service: ?JEFF, [name] 02/22/2022 9:30 A M ?Medical Record Number: [id number] ?Patient Account Number: [id number] ?Date of Birth/Sex: ?Treating RN: ?1946/04/26 (76 y.o. [name]) Dellie Catholic ?Primary Care Rionna Feltes: Agustina Caroli ?Other Clinician: ?Referring Therma Lasure: ?Treating Jakyra Kenealy/Extender: Fredirick Maudlin ?Agustina Caroli ?Weeks in Treatment: 6 ?Vital Signs ?Height(in): ?Pulse(bpm): 65 ?Weight(lbs): ?Blood Pressure(mmHg): 124/75 ?Body Mass Index(BMI): ?Temperature(??F): 98.8 ?Respiratory Rate(breaths/min):  18 ?Photos: [N/A:N/A] ?Right, Posterior Lower Leg N/A N/A ?Wound Location: ?Blister N/A N/A ?Wounding Event: ?Venous Leg Ulcer N/A N/A ?Primary Etiology: ?Coronary Artery Disease, N/A N/A ?Comorbid History: ?Hypertension, Osteoarthritis ?02/19/2022 N/A N/A ?Date Acquired: ?0 N/A N/A ?Weeks of Treatment: ?Open N/A N/A ?Wound Status: ?No N/A N/A ?Wound Recurrence: ?4x5x0.1 N/A N/A ?Measurements L x W x D (cm) ?15.708 N/A N/A ?A (cm?) : ?rea ?1.571 N/A N/A ?Volume (cm?) : ?0.00% N/A N/A ?% Reduction in A rea: ?0.00% N/A N/A ?% Reduction in Volume: ?Full Thickness Without Exposed N/A N/A ?Classification: ?Support Structures ?Medium N/A N/A ?Exudate A mount: ?Serous N/A N/A ?Exudate Type: ?amber N/A N/A ?Exudate Color: ?Flat and Intact N/A N/A ?Wound Margin: ?Large (67-100%) N/A N/A ?Granulation A mount: ?Red, Pink N/A N/A ?Granulation Quality: ?Small (1-33%) N/A N/A ?Necrotic A mount: ?Fat Layer (Subcutaneous Tissue): Yes N/A N/A ?Exposed Structures: ?Fascia: No ?Tendon: No ?Muscle: No ?Joint:  No ?Bone: No ?None N/A N/A ?Epithelialization: ?Debridement - Selective/Open Wound N/A N/A ?Debridement: ?Pre-procedure Verification/Time Out 10:01 N/A N/A ?Taken: ?Other N/A N/A ?Pain Control: ?Necrotic/Eschar, Slough N/A N/A ?Tissue Debrided: ?Non-Viable Tissue N/A N/A ?Level: ?20 N/A N/A ?Debridement A (sq cm): ?rea ?Curette N/A N/A ?Instrument: ?Minimum N/A N/A ?Bleeding: ?Pressure N/A N/A ?Hemostasis A chieved: ?0 N/A N/A ?Procedural Pain: ?0 N/A N/A ?Post Procedural Pain: ?Procedure was tolerated well N/A N/A ?Debridement Treatment Response: ?4x5x0.1 N/A N/A ?Post Debridement Measurements L x ?W x D (cm) ?1.571 N/A N/A ?Post Debridement Volume: (cm?) ?Debridement N/A N/A ?Procedures Performed: ?Treatment Notes ?Electronic Signature(s) ?Signed: 02/22/2022 10:12:32 AM By: Fredirick Maudlin MD FACS ?Signed: 02/22/2022 5:31:41 PM By: Dellie Catholic RN ?Entered By: Fredirick Maudlin on 02/22/2022 10:12:32 ?-------------------------------------------------------------------------------- ?Multi-Disciplinary Care Plan Details ?Patient Name: ?Date of Service: ?PELLECCHIA, [name] 02/22/2022 9:30 A M ?Medical Record Number: [id number] ?Patient Account Number: [id number] ?Date of Birth/Sex: ?Treating RN: ?1946/05/25 (76 y.o. [name]) Dellie Catholic ?Primary Care Tenesia Escudero: Agustina Caroli ?Other Clinician: ?Referring Clayton Jarmon: ?Treating Maicey Barrientez/Extender: Fredirick Maudlin ?Agustina Caroli ?Weeks in Treatment: 6 ?Multidisciplinary Care Plan reviewed with physician ?Active Inactive ?Orientation to the Wound Care Program ?Nursing Diagnoses: ?Knowledge deficit related to the wound healing center program ?Goals: ?Patient/caregiver will verbalize understanding of the [place] ?Date Initiated: 02/22/2022 ?Target Resolution Date: 03/09/2022 ?Goal Status: Active ?Interventions: ?Provide education on orientation to the wound center ?Notes: ?Electronic Signature(s) ?Signed: 02/22/2022 5:31:41 PM By: Dellie Catholic RN ?Entered  By: Dellie Catholic on 02/22/2022 17:28:58 ?-------------------------------------------------------------------------------- ?Pain Assessment Details ?Patient Name: ?Date of Service: ?TOOMEY, [name] 02/22/2022 9:30 A M ?Medical

## 2022-02-23 NOTE — Progress Notes (Signed)
[name] ([id number]) ?Visit Report for 02/22/2022 ?Chief Complaint Document Details ?Patient Name: Date of Service: ?MCGOWAN, [name] 02/22/2022 9:30 A M ?Medical Record Number: [id number] ?Patient Account Number: [id number] ?Date of Birth/Sex: Treating RN: ?10/26/1945 (76 y.o. [name]) Dellie Catholic ?Primary Care Provider: Agustina Caroli Other Clinician: ?Referring Provider: ?Treating Provider/Extender: Fredirick Maudlin ?Agustina Caroli ?Weeks in Treatment: 6 ?Information Obtained from: Patient ?Chief Complaint ?Right lower extremity wound ?Electronic Signature(s) ?Signed: 02/22/2022 10:12:37 AM By: Fredirick Maudlin MD FACS ?Entered By: Fredirick Maudlin on 02/22/2022 10:12:37 ?-------------------------------------------------------------------------------- ?Debridement Details ?Patient Name: Date of Service: ?FLUCKIGER, [name] 02/22/2022 9:30 A M ?Medical Record Number: [id number] ?Patient Account Number: [id number] ?Date of Birth/Sex: Treating RN: ?1946/05/30 (76 y.o. [name]) Dellie Catholic ?Primary Care Provider: Agustina Caroli Other Clinician: ?Referring Provider: ?Treating Provider/Extender: Fredirick Maudlin ?Agustina Caroli ?Weeks in Treatment: 6 ?Debridement Performed for Assessment: Wound #7 Right,Posterior Lower Leg ?Performed By: Physician Fredirick Maudlin, MD ?Debridement Type: Debridement ?Severity of Tissue Pre Debridement: Fat layer exposed ?Level of Consciousness (Pre-procedure): Awake and Alert ?Pre-procedure Verification/Time Out Yes - 10:01 ?Taken: ?Start Time: 10:01 ?Pain Control: ?Other : Benzocaine 20% ?T Area Debrided (L x W): ?otal 4 (cm) x 5 (cm) = 20 (cm?) ?Tissue and other material debrided: Non-Viable, Eschar, [place], [place] ?Level: Non-Viable Tissue ?Debridement Description: Selective/Open Wound ?Instrument: Curette ?Bleeding: Minimum ?Hemostasis Achieved: Pressure ?End Time: 10:02 ?Procedural Pain: 0 ?Post Procedural Pain: 0 ?Response to Treatment: Procedure was tolerated well ?Level of  Consciousness (Post- Awake and Alert ?procedure): ?Post Debridement Measurements of Total Wound ?Length: (cm) 4 ?Width: (cm) 5 ?Depth: (cm) 0.1 ?Volume: (cm?) 1.571 ?Character of Wound/Ulcer Post Debridement: Improved ?Severity of Tissue Post Debridement: Fat layer exposed ?Post Procedure Diagnosis ?Same as Pre-procedure ?Electronic Signature(s) ?Signed: 02/22/2022 12:37:10 PM By: Fredirick Maudlin MD FACS ?Signed: 02/22/2022 5:31:41 PM By: Dellie Catholic RN ?Entered By: Dellie Catholic on 02/22/2022 10:05:05 ?-------------------------------------------------------------------------------- ?HPI Details ?Patient Name: Date of Service: ?YAKUBOV, [name] 02/22/2022 9:30 A M ?Medical Record Number: [id number] ?Patient Account Number: [id number] ?Date of Birth/Sex: Treating RN: ?[birth year] (76 y.o. [name]) Dellie Catholic ?Primary Care Provider: Agustina Caroli Other Clinician: ?Referring Provider: ?Treating Provider/Extender: Fredirick Maudlin ?Agustina Caroli ?Weeks in Treatment: 6 ?History of Present Illness ?HPI Description: Admission 08/23/2021 ?Mr. [name] is a 76 year old male with a past medical history of essential hypertension, coronary artery disease and hyperlipidemia that presents to the ?clinic for a wound to his left lower extremity. He states that 2 months ago he scratched the back of his leg creating a small wound that has not healed. He ?states it drains serous fluid constantly. He has been using Neosporin to the area and keeping it covered. He recently saw his primary care physician and was ?recommended compression stockings and an antibiotic. Patient reports improvement in redness with the use of Duricef. He has been wearing his compression ?stockings for the past week. He is also on a diuretic for his hypertension. He denies signs of infection. ?4/21; patient tolerated the compression wrap well. He has no issues or complaints today. ?READMISSION ?01/10/2022 ?Mr. [name] is back with a new venous ulcer on  the right calf. He healed fairly quickly from his last left sided wound and says that he has been wearing ?compression stockings, albeit intermittently. He thinks they are 15 to 20 mmHg pressure. Since his last wound care visit, the left-sided wound reopened, but he ?was able to get it closed on his own just using topical Neosporin. He attempted to do the same on the right, but it simply  has persisted and his leg is fairly ?swollen and erythematous today. The wounds themselves are scattered and weeping serous fluid, consistent with venous stasis ulcers. ?01/17/2022: The erythema and swelling on the right are improved this week after a course of Augmentin. He has developed new blisters on his posterior left leg ?that he says came up when he started wearing compression stockings, but these are only 15 to 20 mmHg pressure. There is some accumulated eschar on his ?wounds, but no significant slough. We have been using silver alginate and 3 layer compression. ?01/24/2022: His wounds are all healed, but his juxta lite stockings did not arrive. ?02/02/2022: His juxta lite stockings arrived but he only had 1 for the right leg; he did not have a second for his left. He ordered 1 from [place] and was wearing ?them bilaterally. Apparently the one from [place] is the one he used on his right leg and he subsequently developed an area of irritation, potentially from ?friction and has reopened new blister/ulcer lesions on his right lower extremity. They are clean but his skin is weeping. ?02/08/2022: His right-sided wounds are nearly healed. They are very small and superficial and very clean. No weeping or drainage from the leg. ?02/15/2022: His wounds are healed. ?02/22/2022: Despite wearing compressive garments, he had a wound reopened on his right posterior calf. He says that it started out as blisters and then ?subsequently opened, similar to his prior events. It is in a slightly different location. He has never had venous reflux  studies. The wounds are draining serous ?fluid. No concern for infection. ?Electronic Signature(s) ?Signed: 02/22/2022 10:14:16 AM By: Fredirick Maudlin MD FACS ?Entered By: Fredirick Maudlin on 02/22/2022 10:14:15 ?-------------------------------------------------------------------------------- ?Physical Exam Details ?Patient Name: Date of Service: ?LAGRAND, [name] 02/22/2022 9:30 A M ?Medical Record Number: [id number] ?Patient Account Number: [id number] ?Date of Birth/Sex: Treating RN: ?[birth year] (75 y.o. [name]) Dellie Catholic ?Primary Care Provider: Agustina Caroli Other Clinician: ?Referring Provider: ?Treating Provider/Extender: Fredirick Maudlin ?Agustina Caroli ?Weeks in Treatment: 6 ?Constitutional ?. . . . No acute distress. ?Respiratory ?Normal work of breathing on room air. ?Notes ?02/22/2022: On his right posterior calf, there are several small ulcers consistent with blisters that have subsequently opened. There is a little bit of slough ?overlying each of these. No periwound erythema or induration. There are a couple of additional blisters in the area that are not open as of yet. ?Electronic Signature(s) ?Signed: 02/22/2022 10:15:18 AM By: Fredirick Maudlin MD FACS ?Entered By: Fredirick Maudlin on 02/22/2022 10:15:18 ?-------------------------------------------------------------------------------- ?Physician Orders Details ?Patient Name: Date of Service: ?BEACH, [name] 02/22/2022 9:30 A M ?Medical Record Number: [id number] ?Patient Account Number: [id number] ?Date of Birth/Sex: Treating RN: ?22-Feb-1946 (76 y.o. 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 - Dr. Jamas Lav

## 2022-02-28 ENCOUNTER — Ambulatory Visit (HOSPITAL_COMMUNITY)
Admission: RE | Admit: 2022-02-28 | Discharge: 2022-02-28 | Disposition: A | Discharge status: Home / Self Care | Payer: Medicare Other | Source: Ambulatory Visit | Attending: General Surgery | Admitting: General Surgery

## 2022-02-28 ENCOUNTER — Other Ambulatory Visit (HOSPITAL_COMMUNITY): Payer: Self-pay | Admitting: General Surgery

## 2022-02-28 DIAGNOSIS — L97211 Non-pressure chronic ulcer of right calf limited to breakdown of skin: Secondary | ICD-10-CM | POA: Diagnosis not present

## 2022-03-01 ENCOUNTER — Encounter (HOSPITAL_BASED_OUTPATIENT_CLINIC_OR_DEPARTMENT_OTHER): Payer: Medicare Other | Admitting: General Surgery

## 2022-03-01 DIAGNOSIS — L97212 Non-pressure chronic ulcer of right calf with fat layer exposed: Secondary | ICD-10-CM | POA: Diagnosis not present

## 2022-03-01 DIAGNOSIS — E785 Hyperlipidemia, unspecified: Secondary | ICD-10-CM | POA: Diagnosis not present

## 2022-03-01 DIAGNOSIS — I872 Venous insufficiency (chronic) (peripheral): Secondary | ICD-10-CM | POA: Diagnosis not present

## 2022-03-01 DIAGNOSIS — I2510 Atherosclerotic heart disease of native coronary artery without angina pectoris: Secondary | ICD-10-CM | POA: Diagnosis not present

## 2022-03-01 DIAGNOSIS — I87303 Chronic venous hypertension (idiopathic) without complications of bilateral lower extremity: Secondary | ICD-10-CM | POA: Diagnosis not present

## 2022-03-01 DIAGNOSIS — I87311 Chronic venous hypertension (idiopathic) with ulcer of right lower extremity: Secondary | ICD-10-CM | POA: Diagnosis not present

## 2022-03-01 DIAGNOSIS — Z87891 Personal history of nicotine dependence: Secondary | ICD-10-CM | POA: Diagnosis not present

## 2022-03-01 DIAGNOSIS — I10 Essential (primary) hypertension: Secondary | ICD-10-CM | POA: Diagnosis not present

## 2022-03-01 DIAGNOSIS — L97211 Non-pressure chronic ulcer of right calf limited to breakdown of skin: Secondary | ICD-10-CM | POA: Diagnosis not present

## 2022-03-02 NOTE — Progress Notes (Signed)
[name], [name] ([id number]) Visit Report for 03/01/2022 Arrival Information Details Patient Name: Date of Service: [name], [name] 03/01/2022 11:00 A M Medical Record Number: [id number] Patient Account Number: [id number] Date of Birth/Sex: Treating RN: 1945-11-01 (76 y.o. [name] Primary Care Kinser Fellman: Agustina Caroli Other Clinician: Referring Zerrick Hanssen: Treating Arlayne Liggins/Extender: Maximino Sarin in Treatment: 7 Visit Information History Since Last Visit Added or deleted any medications: No Patient Arrived: Ambulatory Any new allergies or adverse reactions: No Arrival Time: 11:09 Had a fall or experienced change in No Accompanied By: wife activities of daily living that may affect Transfer Assistance: None risk of falls: Patient Identification Verified: Yes Signs or symptoms of abuse/neglect since last visito No Secondary Verification Process Completed: Yes Hospitalized since last visit: No Patient Requires Transmission-Based Precautions: No Implantable device outside of the clinic excluding No Patient Has Alerts: Yes cellular tissue based products placed in the center Patient Alerts: Patient on Blood Thinner since last visit: Xarelto Has Dressing in Place as Prescribed: Yes Pain Present Now: No Electronic Signature(s) Signed: 03/02/2022 5:57:27 PM By: Levan Hurst RN, BSN Entered By: Levan Hurst on 03/01/2022 11:09:55 -------------------------------------------------------------------------------- Compression Therapy Details Patient Name: Date of Service: [name], [name] 03/01/2022 11:00 [place] Record Number: [id number] Patient Account Number: [id number] Date of Birth/Sex: Treating RN: 02-Nov-1945 (76 y.o. [name] Primary Care Starlin Steib: Agustina Caroli Other Clinician: Referring Jomari Bartnik: Treating Kinney Sackmann/Extender: Irineo Axon Weeks in Treatment: 7 Compression Therapy Performed for Wound  Assessment: Wound #7 Right,Posterior Lower Leg Performed By: Clinician Dellie Catholic, RN Compression Type: Three Layer Post Procedure Diagnosis Same as Pre-procedure Electronic Signature(s) Signed: 03/01/2022 6:15:43 PM By: Dellie Catholic RN Entered By: Dellie Catholic on 03/01/2022 11:49:21 -------------------------------------------------------------------------------- Encounter Discharge Information Details Patient Name: Date of Service: [name], [name] 03/01/2022 11:00 [place] Record Number: [id number] Patient Account Number: [id number] Date of Birth/Sex: Treating RN: 1945-10-16 (76 y.o. [name] Primary Care Alondra Sahni: Agustina Caroli Other Clinician: Referring Antawn Sison: Treating Michel Eskelson/Extender: Maximino Sarin in Treatment: 7 Encounter Discharge Information Items Post Procedure Vitals Discharge Condition: Stable Temperature (F): 98.1 Ambulatory Status: Ambulatory Pulse (bpm): 56 Discharge Destination: Home Respiratory Rate (breaths/min): 18 Transportation: Private Auto Blood Pressure (mmHg): 131/82 Accompanied By: wife Schedule Follow-up Appointment: Yes Clinical Summary of Care: Patient Declined Electronic Signature(s) Signed: 03/01/2022 6:15:43 PM By: Dellie Catholic RN Entered By: Dellie Catholic on 03/01/2022 12:04:38 -------------------------------------------------------------------------------- Lower Extremity Assessment Details Patient Name: Date of Service: [name], [name] 03/01/2022 11:00 A M Medical Record Number: [id number] Patient Account Number: [id number] Date of Birth/Sex: Treating RN: 07/13/1946 (76 y.o. [name] Primary Care Tonica Brasington: Agustina Caroli Other Clinician: Referring Rayquon Uselman: Treating Ragena Fiola/Extender: Irineo Axon Weeks in Treatment: 7 Edema Assessment Assessed: [Left: No] [Right: No] Edema: [Left: Ye] [Right: s] Calf Left: Right: Point of Measurement:  43 cm From Medial Instep 44 cm 45 cm Ankle Left: Right: Point of Measurement: 27 cm From Medial Instep 24 cm 26.4 cm Electronic Signature(s) Signed: 03/01/2022 6:15:43 PM By: Dellie Catholic RN Entered By: Dellie Catholic on 03/01/2022 11:35:37 -------------------------------------------------------------------------------- Multi Wound Chart Details Patient Name: Date of Service: [name], [name] 03/01/2022 11:00 A M Medical Record Number: [id number] Patient Account Number: [id number] Date of Birth/Sex: Treating RN: 05-18-1946 (76 y.o. [name] Primary Care Julissa Browning: Agustina Caroli Other Clinician: Referring Saskia Simerson: Treating Paytan Recine/Extender: Irineo Axon Weeks in Treatment: 7 Vital Signs Height(in): Pulse(bpm): 56 Weight(lbs): Blood Pressure(mmHg): 131/82 Body Mass Index(BMI): Temperature(F): 98.1 Respiratory Rate(breaths/min): 18  Photos: [N/A:N/A] Right, Posterior Lower Leg N/A N/A Wound Location: Blister N/A N/A Wounding Event: Venous Leg Ulcer N/A N/A Primary Etiology: Coronary Artery Disease, N/A N/A Comorbid History: Hypertension, Osteoarthritis 02/19/2022 N/A N/A Date Acquired: 1 N/A N/A Weeks of Treatment: Open N/A N/A Wound Status: No N/A N/A Wound Recurrence: 1.6x5x0.1 N/A N/A Measurements L x W x D (cm) 6.283 N/A N/A A (cm) : rea 0.628 N/A N/A Volume (cm) : 60.00% N/A N/A % Reduction in A rea: 60.00% N/A N/A % Reduction in Volume: Full Thickness Without Exposed N/A N/A Classification: Support Structures Medium N/A N/A Exudate A mount: Serous N/A N/A Exudate Type: amber N/A N/A Exudate Color: Flat and Intact N/A N/A Wound Margin: Large (67-100%) N/A N/A Granulation A mount: Red, Pink N/A N/A Granulation Quality: Small (1-33%) N/A N/A Necrotic A mount: Eschar N/A N/A Necrotic Tissue: Fat Layer (Subcutaneous Tissue): Yes N/A N/A Exposed Structures: Fascia: No Tendon: No Muscle: No Joint:  No Bone: No Small (1-33%) N/A N/A Epithelialization: Debridement - Selective/Open Wound N/A N/A Debridement: Pre-procedure Verification/Time Out 11:40 N/A N/A Taken: Other N/A N/A Pain Control: Necrotic/Eschar N/A N/A Tissue Debrided: Non-Viable Tissue N/A N/A Level: 8 N/A N/A Debridement A (sq cm): rea Curette N/A N/A Instrument: Large N/A N/A Bleeding: Pressure N/A N/A Hemostasis A chieved: 0 N/A N/A Procedural Pain: 0 N/A N/A Post Procedural Pain: Procedure was tolerated well N/A N/A Debridement Treatment Response: 1.6x5x0.1 N/A N/A Post Debridement Measurements L x W x D (cm) 0.628 N/A N/A Post Debridement Volume: (cm) Compression Therapy N/A N/A Procedures Performed: Debridement Treatment Notes Electronic Signature(s) Signed: 03/01/2022 12:03:04 PM By: Fredirick Maudlin MD FACS Signed: 03/01/2022 6:15:43 PM By: Dellie Catholic RN Entered By: Fredirick Maudlin on 03/01/2022 12:03:04 -------------------------------------------------------------------------------- Multi-Disciplinary Care Plan Details Patient Name: Date of Service: [name], [name] 03/01/2022 11:00 A M Medical Record Number: [id number] Patient Account Number: [id number] Date of Birth/Sex: Treating RN: Apr 25, 1946 (76 y.o. [name] Primary Care Alysen Smylie: Agustina Caroli Other Clinician: Referring Paola Aleshire: Treating Reyaan Thoma/Extender: Maximino Sarin in Treatment: 7 [place] reviewed with physician Active Inactive Orientation to the Wound Care Program Nursing Diagnoses: Knowledge deficit related to the wound healing center program Goals: Patient/caregiver will verbalize understanding of the [place] Program Date Initiated: 02/22/2022 Target Resolution Date: 03/09/2022 Goal Status: Active Interventions: Provide education on orientation to the wound center Notes: Electronic Signature(s) Signed: 03/01/2022 6:15:43 PM By:  Dellie Catholic RN Entered By: Dellie Catholic on 03/01/2022 12:03:32 -------------------------------------------------------------------------------- Pain Assessment Details Patient Name: Date of Service: [name], [name] 03/01/2022 11:00 [place] Record Number: [id number] Patient Account Number: [id number] Date of Birth/Sex: Treating RN: [birth year] (76 y.o. [name] Primary Care Emori Mumme: Agustina Caroli Other Clinician: Referring Chirag Krueger: Treating Margrit Minner/Extender: Irineo Axon Weeks in Treatment: 7 Active Problems Location of Pain Severity and Description of Pain Patient Has Paino No Site Locations Pain Management and Medication Current Pain Management: Electronic Signature(s) Signed: 03/02/2022 5:57:27 PM By: Levan Hurst RN, BSN Entered By: Levan Hurst on 03/01/2022 11:11:09 -------------------------------------------------------------------------------- Patient/Caregiver Education Details Patient Name: Date of Service: Danton Sewer Rich 5/23/2023andnbsp11:00 [place] Record Number: [id number] Patient Account Number: [id number] Date of Birth/Gender: Treating RN: 09-Apr-1946 (76 y.o. [name] Primary Care Physician: Agustina Caroli Other Clinician: Referring Physician: Treating Physician/Extender: Maximino Sarin in Treatment: 7 Education Assessment Education Provided To: Patient Education Topics Provided Wound/Skin Impairment: Methods: Explain/Verbal Responses: Reinforcements needed, State content correctly Electronic Signature(s) Signed: 03/01/2022 6:15:43 PM By:  Dellie Catholic RN Entered By: Dellie Catholic on 03/01/2022 12:03:44 -------------------------------------------------------------------------------- Wound Assessment Details Patient Name: Date of Service: [name], [name] 03/01/2022 11:00 A M Medical Record Number: [id number] Patient Account Number: [id number] Date of  Birth/Sex: Treating RN: May 14, 1946 (76 y.o. [name] Primary Care Renard Caperton: Agustina Caroli Other Clinician: Referring Fleming Prill: Treating Devlyn Parish/Extender: Irineo Axon Weeks in Treatment: 7 Wound Status Wound Number: 7 Primary Etiology: Venous Leg Ulcer Wound Location: Right, Posterior Lower Leg Wound Status: Open Wounding Event: Blister Comorbid History: Coronary Artery Disease, Hypertension, Osteoarthritis Date Acquired: 02/19/2022 Weeks Of Treatment: 1 Clustered Wound: No Photos Wound Measurements Length: (cm) 1.6 Width: (cm) 5 Depth: (cm) 0.1 Area: (cm) 6.283 Volume: (cm) 0.628 % Reduction in Area: 60% % Reduction in Volume: 60% Epithelialization: Small (1-33%) Tunneling: No Undermining: No Wound Description Classification: Full Thickness Without Exposed Support Structures Wound Margin: Flat and Intact Exudate Amount: Medium Exudate Type: Serous Exudate Color: amber Foul Odor After Cleansing: No Slough/Fibrino Yes Wound Bed Granulation Amount: Large (67-100%) Exposed Structure Granulation Quality: Red, Pink Fascia Exposed: No Necrotic Amount: Small (1-33%) Fat Layer (Subcutaneous Tissue) Exposed: Yes Necrotic Quality: Eschar Tendon Exposed: No Muscle Exposed: No Joint Exposed: No Bone Exposed: No Treatment Notes Wound #7 (Lower Leg) Wound Laterality: Right, Posterior Cleanser Soap and Water Discharge Instruction: May shower and wash wound with dial antibacterial soap and water prior to dressing change. Wound Cleanser Discharge Instruction: Cleanse the wound with wound cleanser prior to applying a clean dressing using gauze sponges, not tissue or cotton balls. Peri-Wound Care Topical Primary Dressing Promogran Prisma Matrix, 4.34 (sq in) (silver collagen) Discharge Instruction: Moisten collagen with saline or hydrogel Secondary Dressing Woven Gauze Sponge, Non-Sterile 4x4 in Discharge Instruction: Apply over primary  dressing as directed. Zetuvit Plus 4x8 in Discharge Instruction: Apply over primary dressing as directed. Secured With [employer] Surgical T 2x10 (in/yd) ape Discharge Instruction: Secure with tape as directed. Compression Wrap ThreePress (3 layer compression wrap) Discharge Instruction: Apply three layer compression as directed. Compression Stockings Add-Ons Electronic Signature(s) Signed: 03/01/2022 6:15:43 PM By: Dellie Catholic RN Signed: 03/02/2022 5:57:27 PM By: Levan Hurst RN, BSN Entered By: Dellie Catholic on 03/01/2022 11:36:29 -------------------------------------------------------------------------------- Vitals Details Patient Name: Date of Service: [name], [name] 03/01/2022 11:00 A M Medical Record Number: [id number] Patient Account Number: [id number] Date of Birth/Sex: Treating RN: Feb 06, 1946 (76 y.o. [name] Primary Care [place] Greb: Agustina Caroli Other Clinician: Referring Rajon Bisig: Treating Twila Rappa/Extender: Irineo Axon Weeks in Treatment: 7 Vital Signs Time Taken: 11:10 Temperature (F): 98.1 Pulse (bpm): 56 Respiratory Rate (breaths/min): 18 Blood Pressure (mmHg): 131/82 Reference Range: 80 - 120 mg / dl Electronic Signature(s) Signed: 03/02/2022 5:57:27 PM By: Levan Hurst RN, BSN Entered By: Levan Hurst on 03/01/2022 11:10:26

## 2022-03-02 NOTE — Progress Notes (Signed)
[name], [name] ([id number]) Visit Report for 03/01/2022 Chief Complaint Document Details Patient Name: Date of Service: [name], [name] 03/01/2022 11:00 A M Medical Record Number: [id number] Patient Account Number: [id number] Date of Birth/Sex: Treating RN: Sep 28, 1946 (76 y.o. [name] Primary Care Provider: Agustina Rich Other Clinician: Referring Provider: Treating Provider/Extender: [name] Weeks in Treatment: 7 Information Obtained from: Patient Chief Complaint Right lower extremity wound Electronic Signature(s) Signed: 03/01/2022 12:03:10 PM By: Fredirick Maudlin MD FACS Entered By: Fredirick Maudlin on 03/01/2022 12:03:10 -------------------------------------------------------------------------------- Debridement Details Patient Name: Date of Service: [name], [name] 03/01/2022 11:00 A M Medical Record Number: [id number] Patient Account Number: [id number] Date of Birth/Sex: Treating RN: 07/27/1946 (76 y.o. [name] Primary Care Provider: Agustina Rich Other Clinician: Referring Provider: Treating Provider/Extender: [name] Weeks in Treatment: 7 Debridement Performed for Assessment: Wound #7 Right,Posterior Lower Leg Performed By: Physician Fredirick Maudlin, MD Debridement Type: Debridement Severity of Tissue Pre Debridement: Fat layer exposed Level of Consciousness (Pre-procedure): Awake and Alert Pre-procedure Verification/Time Out Yes - 11:40 Taken: Start Time: 11:40 Pain Control: Other : Benzocaine 20% T Area Debrided (L x W): otal 1.6 (cm) x 5 (cm) = 8 (cm) Tissue and other material debrided: Non-Viable, Eschar Level: Non-Viable Tissue Debridement Description: Selective/Open Wound Instrument: Curette Bleeding: Large Hemostasis Achieved: Pressure End Time: 11:42 Procedural Pain: 0 Post Procedural Pain: 0 Response to Treatment: Procedure was tolerated well Level of Consciousness (Post-  Awake and Alert procedure): Post Debridement Measurements of Total Wound Length: (cm) 1.6 Width: (cm) 5 Depth: (cm) 0.1 Volume: (cm) 0.628 Character of Wound/Ulcer Post Debridement: Improved Severity of Tissue Post Debridement: Fat layer exposed Post Procedure Diagnosis Same as Pre-procedure Electronic Signature(s) Signed: 03/01/2022 12:11:34 PM By: Fredirick Maudlin MD FACS Signed: 03/01/2022 6:15:43 PM By: Dellie Catholic RN Entered By: Dellie Catholic on 03/01/2022 11:49:05 -------------------------------------------------------------------------------- HPI Details Patient Name: Date of Service: [name], [name] 03/01/2022 11:00 A M Medical Record Number: [id number] Patient Account Number: [id number] Date of Birth/Sex: Treating RN: 1946/06/12 (76 y.o. [name] Primary Care Provider: Agustina Rich Other Clinician: Referring Provider: Treating Provider/Extender: [name] Weeks in Treatment: 7 History of Present Illness HPI Description: Admission 08/23/2021 Mr. [name] is a 76 year old male with a past medical history of essential hypertension, coronary artery disease and hyperlipidemia that presents to the clinic for a wound to his left lower extremity. He states that 2 months ago he scratched the back of his leg creating a small wound that has not healed. He states it drains serous fluid constantly. He has been using Neosporin to the area and keeping it covered. He recently saw his primary care physician and was recommended compression stockings and an antibiotic. Patient reports improvement in redness with the use of Duricef. He has been wearing his compression stockings for the past week. He is also on a diuretic for his hypertension. He denies signs of infection. 4/21; patient tolerated the compression wrap well. He has no issues or complaints today. READMISSION 01/10/2022 [name] is back with a new venous ulcer on the right calf. He  healed fairly quickly from his last left sided wound and says that he has been wearing compression stockings, albeit intermittently. He thinks they are 15 to 20 mmHg pressure. Since his last wound care visit, the left-sided wound reopened, but he was able to get it closed on his own just using topical Neosporin. He attempted to do the same on the right, but it simply has persisted  and his leg is fairly swollen and erythematous today. The wounds themselves are scattered and weeping serous fluid, consistent with venous stasis ulcers. 01/17/2022: The erythema and swelling on the right are improved this week after a course of Augmentin. He has developed new blisters on his posterior left leg that he says came up when he started wearing compression stockings, but these are only 15 to 20 mmHg pressure. There is some accumulated eschar on his wounds, but no significant slough. We have been using silver alginate and 3 layer compression. 01/24/2022: His wounds are all healed, but his juxta lite stockings did not arrive. 02/02/2022: His juxta lite stockings arrived but he only had 1 for the right leg; he did not have a second for his left. He [place] 1 from [place] and was wearing them bilaterally. Apparently the one from [place] is the one he used on his right leg and he subsequently developed an area of irritation, potentially from friction and has reopened new blister/ulcer lesions on his right lower extremity. They are clean but his skin is weeping. 02/08/2022: His right-sided wounds are nearly healed. They are very small and superficial and very clean. No weeping or drainage from the leg. 02/15/2022: His wounds are healed. 02/22/2022: Despite wearing compressive garments, he had a wound reopened on his right posterior calf. He says that it started out as blisters and then subsequently opened, similar to his prior events. It is in a slightly different location. He has never had venous reflux studies. The wounds are  draining serous fluid. No concern for infection. 03/01/2022: He went for venous reflux studies and was found to have venous reflux in the right short saphenous vein, as well as extensive venous reflux throughout all of the superficial veins in his left lower extremity. The ulcer itself is smaller, but he is frustrated at the persistence of the problem. Electronic Signature(s) Signed: 03/01/2022 12:04:22 PM By: Fredirick Maudlin MD FACS Entered By: Fredirick Maudlin on 03/01/2022 12:04:21 -------------------------------------------------------------------------------- Physical Exam Details Patient Name: Date of Service: [name], [name] 03/01/2022 11:00 A M Medical Record Number: [id number] Patient Account Number: [id number] Date of Birth/Sex: Treating RN: 02-01-1946 (76 y.o. [name] Primary Care Provider: Agustina Rich Other Clinician: Referring Provider: Treating Provider/Extender: [name] Weeks in Treatment: 7 Constitutional . Bradycardic, asymptomatic.. . . No acute distress. Respiratory Normal work of breathing on room air. Notes 03/01/2022: The ulcer on his posterior calf is superficial and clean with just a bit of eschar accumulation. His edema control is adequate. Electronic Signature(s) Signed: 03/01/2022 12:08:21 PM By: Fredirick Maudlin MD FACS Entered By: Fredirick Maudlin on 03/01/2022 12:08:20 -------------------------------------------------------------------------------- Physician Orders Details Patient Name: Date of Service: [name], [name] 03/01/2022 11:00 A M Medical Record Number: [id number] Patient Account Number: [id number] Date of Birth/Sex: Treating RN: [birth year] (76 y.o. 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 - Dr. Celine Ahr - Room 3 Return A Bathing/ Shower/ Hygiene May shower with protection but do not get wound dressing(s) wet. - Ok to use Biomedical engineer, can purchase at [facility], [facility], or [employer] Edema Control -  Lymphedema / SCD / Other Elevate legs to the level of the heart or above for 30 minutes daily and/or when sitting, a frequency of: - throughout the day Avoid standing for long periods of time. Moisturize legs daily. - left leg daily Compression stocking or Garment 20-30 mm/Hg pressure to: - left leg daily Wound Treatment Wound #7 - Lower Leg Wound Laterality: Right, Posterior Cleanser: Soap and Water 1 x Per Week/30 Days Discharge Instructions: May shower and wash wound with dial antibacterial soap and water prior to dressing change. Cleanser: Wound Cleanser 1 x Per Week/30 Days Discharge Instructions: Cleanse the wound with wound cleanser prior to applying a clean dressing using gauze sponges, not tissue or cotton balls. Prim Dressing: Promogran Prisma Matrix, 4.34 (sq in) (silver collagen) 1 x Per Week/30 Days ary Discharge Instructions: Moisten collagen with saline or hydrogel Secondary Dressing: Woven Gauze Sponge, Non-Sterile 4x4 in 1 x Per Week/30 Days Discharge Instructions: Apply over primary dressing as directed. Secondary Dressing: Zetuvit Plus 4x8 in 1 x Per Week/30 Days Discharge Instructions: Apply over primary dressing as directed. Secured With: 3M Medipore Public affairs consultant Surgical T 2x10 (in/yd) 1 x Per Week/30 Days ape Discharge Instructions: Secure with tape as directed. Compression Wrap: ThreePress (3 layer  compression wrap) 1 x Per Week/30 Days Discharge Instructions: Apply three layer compression as directed. 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 [place], [place] [address] [place], [place] [address] [phone] Allergies No Known Drug Allergies Provider's Orders Vascular - ICD10: I87.303 - Consultation for Vascular Surgery for possible ablation Hand Signature: Date(s): Electronic Signature(s) Signed: 03/01/2022 6:15:43 PM By: Dellie Catholic RN Signed: 03/02/2022 8:20:09 AM By: Fredirick Maudlin MD FACS Previous Signature: 03/01/2022 12:10:04 PM Version By: Fredirick Maudlin MD FACS Entered By: Dellie Catholic on 03/01/2022 00:93:81 -------------------------------------------------------------------------------- Problem List Details Patient Name: Date of Service: [name], [name] 03/01/2022 11:00 A M Medical Record Number: [id number] Patient Account Number: [id number] Date of Birth/Sex: Treating RN: [birth year] (76 y.o. [name] Primary Care Provider: Agustina Rich Other Clinician: Referring Provider: Treating Provider/Extender: [name] Weeks in Treatment:  7 Active Problems ICD-10 Encounter Code Description Active Date MDM Diagnosis I10 Essential (primary) hypertension 01/10/2022 No Yes I25.10 Atherosclerotic heart disease of native coronary artery without angina pectoris 01/10/2022 No Yes I87.303 Chronic venous hypertension (idiopathic) without complications of bilateral 01/10/2022 No Yes lower extremity L97.211 Non-pressure chronic ulcer of right calf limited to breakdown of skin 01/10/2022 No Yes Inactive Problems Resolved Problems Electronic Signature(s) Signed: 03/01/2022 12:02:52 PM By: Fredirick Maudlin MD FACS Entered By: Fredirick Maudlin on 03/01/2022 12:02:52 -------------------------------------------------------------------------------- Progress Note Details Patient Name: Date of Service: [name], [name] 03/01/2022 11:00 [place] Record Number: [id number] Patient Account Number: [id number] Date of Birth/Sex: Treating RN: 25-Dec-1945 (76 y.o. [name] Primary Care Provider: Agustina Rich Other Clinician: Referring Provider: Treating Provider/Extender: [name] Weeks in Treatment: 7 Subjective Chief Complaint  Information obtained from Patient Right lower extremity wound History of Present Illness (HPI) Admission 08/23/2021 Mr. Juleon Narang is a 76 year old male with a past medical history of essential hypertension, coronary artery disease and hyperlipidemia that presents to the clinic for a wound to his left lower extremity. He states that 2 months ago he scratched the back of his leg creating a small wound that has not healed. He states it drains serous fluid constantly. He has been using Neosporin to the area and keeping it covered. He recently saw his primary care physician and was recommended compression stockings and an antibiotic. Patient reports improvement in redness with the use of Duricef. He has been wearing his compression stockings for the past week. He is also on a diuretic  for his hypertension. He denies signs of infection. 4/21; patient tolerated the compression wrap well. He has no issues or complaints today. READMISSION 01/10/2022 Mr. Voong is back with a new venous ulcer on the right calf. He healed fairly quickly from his last left sided wound and says that he has been wearing compression stockings, albeit intermittently. He thinks they are 15 to 20 mmHg pressure. Since his last wound care visit, the left-sided wound reopened, but he was able to get it closed on his own just using topical Neosporin. He attempted to do the same on the right, but it simply has persisted and his leg is fairly swollen and erythematous today. The wounds themselves are scattered and weeping serous fluid, consistent with venous stasis ulcers. 01/17/2022: The erythema and swelling on the right are improved this week after a course of Augmentin. He has developed new blisters on his posterior left leg that he says came up when he started wearing compression stockings, but these are only 15 to 20 mmHg pressure. There is some accumulated eschar on his wounds, but no significant slough. We have been using silver alginate and 3 layer compression. 01/24/2022: His wounds are all healed, but his juxta lite stockings did not arrive. 02/02/2022: His juxta lite stockings arrived but he only had 1 for the right leg; he did not have a second for his left. He ordered 1 [place] and was wearing them bilaterally. Apparently the one from [place] is the one he used on his right leg and he subsequently developed an area of irritation, potentially from friction and has reopened new blister/ulcer lesions on his right lower extremity. They are clean but his skin is weeping. 02/08/2022: His right-sided wounds are nearly healed. They are very small and superficial and very clean. No weeping or drainage from the leg. 02/15/2022: His wounds are healed. 02/22/2022: Despite wearing compressive garments, he had a wound  reopened on his right posterior calf. He says that it started out as blisters and then subsequently opened, similar to his prior events. It is in a slightly different location. He has never had venous reflux studies. The wounds are draining serous fluid. No concern for infection. 03/01/2022: He went for venous reflux studies and was found to have venous reflux in the right short saphenous vein, as well as extensive venous reflux throughout all of the superficial veins in his left lower extremity. The ulcer itself is smaller, but he is frustrated at the persistence of the problem. Patient History Information obtained from Patient. Family History Unknown History. Social History Former smoker - quit 50 years ago, Marital Status - Married, Alcohol Use - Rarely, Drug Use - No History, Caffeine Use - Rarely. Medical History Eyes Denies history of  Cataracts, Glaucoma, Optic Neuritis Ear/Nose/Mouth/Throat Denies history of Chronic sinus problems/congestion, Middle ear problems Hematologic/Lymphatic Denies history of Anemia, Hemophilia, Human Immunodeficiency Virus, Lymphedema, Sickle Cell Disease Respiratory Denies history of Aspiration, Asthma, Chronic Obstructive Pulmonary Disease (COPD), Pneumothorax, Sleep Apnea, Tuberculosis Cardiovascular Patient has history of Coronary Artery Disease - stents x 3, Hypertension Gastrointestinal Denies history of Cirrhosis , Colitis, Crohnoos, Hepatitis A, Hepatitis B, Hepatitis C Endocrine Denies history of Type I Diabetes, Type II Diabetes Genitourinary Denies history of End Stage Renal Disease Immunological Denies history of Lupus Erythematosus, Raynaudoos, Scleroderma Integumentary (Skin) Denies history of History of Burn Musculoskeletal Patient has history of Osteoarthritis Neurologic Denies history of Dementia, Neuropathy, Quadriplegia, Paraplegia, Seizure Disorder Oncologic Denies history of Received Chemotherapy, Received  Radiation Psychiatric Denies history of Anorexia/bulimia, Confinement Anxiety Medical A Surgical History Notes nd Ear/Nose/Mouth/Throat hard of hearing Respiratory Hx Pulmonary Embolism Cardiovascular Hypertension Oncologic Melanoma on back 2018 Objective Constitutional Bradycardic, asymptomatic.Marland Kitchen No acute distress. Vitals Time Taken: 11:10 AM, Temperature: 98.1 F, Pulse: 56 bpm, Respiratory Rate: 18 breaths/min, Blood Pressure: 131/82 mmHg. Respiratory Normal work of breathing on room air. General Notes: 03/01/2022: The ulcer on his posterior calf is superficial and clean with just a bit of eschar accumulation. His edema control is adequate. Integumentary (Hair, Skin) Wound #7 status is Open. Original cause of wound was Blister. The date acquired was: 02/19/2022. The wound has been in treatment 1 weeks. The wound is located on the Right,Posterior Lower Leg. The wound measures 1.6cm length x 5cm width x 0.1cm depth; 6.283cm^2 area and 0.628cm^3 volume. There is Fat Layer (Subcutaneous Tissue) exposed. There is no tunneling or undermining noted. There is a medium amount of serous drainage noted. The wound margin is flat and intact. There is large (67-100%) red, pink granulation within the wound bed. There is a small (1-33%) amount of necrotic tissue within the wound bed including Eschar. 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 There was a Selective/Open Wound Non-Viable Tissue Debridement with a total area of 8 sq cm performed by Fredirick Maudlin, MD. With the following instrument(s): Curette to remove Non-Viable tissue/material. Material removed includes Eschar after achieving pain control using Other (Benzocaine 20%). No specimens were taken. A time out was conducted at 11:40, prior to the start of the procedure. A Large amount of bleeding was controlled with Pressure. The procedure was tolerated well with a pain level of 0 throughout and a pain level of 0 following the procedure. Post Debridement Measurements: 1.6cm length x 5cm width x 0.1cm depth; 0.628cm^3 volume. Character of Wound/Ulcer Post Debridement is improved. Severity of Tissue Post Debridement is: Fat layer exposed. Post procedure Diagnosis Wound #7: Same as Pre-Procedure Pre-procedure diagnosis of Wound #7 is a Venous Leg Ulcer located on the Right,Posterior Lower Leg . There was a Three Layer Compression Therapy Procedure by Dellie Catholic, RN. Post procedure Diagnosis Wound #7: Same as Pre-Procedure Plan Follow-up Appointments: Return Appointment in 1 week. - Dr. Celine Ahr - Room 3 Bathing/ Shower/ Hygiene: May shower with protection but do not get wound dressing(s) wet. - Ok to use Biomedical engineer, can purchase at [facility], [facility], or [employer] Edema Control - Lymphedema /  SCD / Other: Elevate legs to the level of the heart or above for 30 minutes daily and/or when sitting, a frequency of: - throughout the day Avoid standing for long periods of time. Moisturize legs daily. - left leg daily Compression stocking or Garment 20-30 mm/Hg pressure to: - left leg daily Consults ordered were: Vascular - Consultation for Vascular Surgery for possible ablation WOUND #7: - Lower Leg Wound Laterality: Right, Posterior Cleanser: Soap and Water 1 x Per Week/30 Days Discharge Instructions: May shower and wash wound with dial antibacterial soap and water prior to dressing change. Cleanser: Wound Cleanser 1 x Per Week/30 Days Discharge Instructions: Cleanse the wound with wound  cleanser prior to applying a clean dressing using gauze sponges, not tissue or cotton balls. Prim Dressing: Promogran Prisma Matrix, 4.34 (sq in) (silver collagen) 1 x Per Week/30 Days ary Discharge Instructions: Moisten collagen with saline or hydrogel Secondary Dressing: Woven Gauze Sponge, Non-Sterile 4x4 in 1 x Per Week/30 Days Discharge Instructions: Apply over primary dressing as directed. Secondary Dressing: Zetuvit Plus 4x8 in 1 x Per Week/30 Days Discharge Instructions: Apply over primary dressing as directed. Secured With: 86M Medipore Public affairs consultant Surgical T 2x10 (in/yd) 1 x Per Week/30 Days ape Discharge Instructions: Secure with tape as directed. Com pression Wrap: ThreePress (3 layer compression wrap) 1 x Per Week/30 Days Discharge Instructions: Apply three layer compression as directed. 03/01/2022: The ulcer on his posterior calf is superficial and clean with just a bit of eschar accumulation. His edema control is adequate. I debrided the eschar from the wound site. As he is quite frustrated with the lack of progress, I am going to change his dressing to silver collagen and continue the compressive wraps. We will refer him to vascular surgery to discuss whether or not he might be a candidate for venous ablation, given the presence of reflux. It is somewhat limited on the right but fairly extensive on the left and he may benefit from this treatment. Follow-up in 1 week. Electronic Signature(s) Signed: 03/01/2022 12:09:36 PM By: Fredirick Maudlin MD FACS Entered By: Fredirick Maudlin on 03/01/2022 12:09:35 -------------------------------------------------------------------------------- HxROS Details Patient Name: Date of Service: [name], [name] 03/01/2022 11:00 A M Medical Record Number: [id number] Patient Account Number: [id number] Date of Birth/Sex: Treating RN: 1946/08/23 (76 y.o. [name] Primary Care Provider: Agustina Rich Other Clinician: Referring  Provider: Treating Provider/Extender: Maximino Sarin in Treatment: 7 Information Obtained From Patient Eyes Medical History: Negative for: Cataracts; Glaucoma; Optic Neuritis Ear/Nose/Mouth/Throat Medical History: Negative for: Chronic sinus problems/congestion; Middle ear problems Past Medical History Notes: hard of hearing Hematologic/Lymphatic Medical History: Negative for: Anemia; Hemophilia; Human Immunodeficiency Virus; Lymphedema; Sickle Cell Disease Respiratory Medical History: Negative for: Aspiration; Asthma; Chronic Obstructive Pulmonary Disease (COPD); Pneumothorax; Sleep Apnea; Tuberculosis Past Medical History Notes: Hx Pulmonary Embolism Cardiovascular Medical History: Positive for: Coronary Artery Disease - stents x 3; Hypertension Past Medical History Notes: Hypertension Gastrointestinal Medical History: Negative for: Cirrhosis ; Colitis; Crohns; Hepatitis A; Hepatitis B; Hepatitis C Endocrine Medical History: Negative for: Type I Diabetes; Type II Diabetes Genitourinary Medical History: Negative for: End Stage Renal Disease Immunological Medical History: Negative for: Lupus Erythematosus; Raynauds; Scleroderma Integumentary (Skin) Medical History: Negative for: History of Burn Musculoskeletal Medical History: Positive for: Osteoarthritis Neurologic Medical History: Negative for: Dementia; Neuropathy; Quadriplegia; Paraplegia; Seizure Disorder Oncologic Medical History: Negative for: Received Chemotherapy; Received Radiation Past Medical History Notes: Melanoma on back 2018 Psychiatric Medical History: Negative for: Anorexia/bulimia; Confinement Anxiety Immunizations Pneumococcal Vaccine:  Received Pneumococcal Vaccination: Yes Received Pneumococcal Vaccination On or After 60th Birthday: Yes Implantable Devices None Family and Social History Unknown History: Yes; Former smoker - quit 50 years ago; Marital Status -  Married; Alcohol Use: Rarely; Drug Use: No History; Caffeine Use: Rarely; Financial Concerns: No; Food, Clothing or Shelter Needs: No; Support System Lacking: No; Transportation Concerns: No Engineer, maintenance) Signed: 03/01/2022 12:11:34 PM By: Fredirick Maudlin MD FACS Signed: 03/01/2022 6:15:43 PM By: Dellie Catholic RN Entered By: Fredirick Maudlin on 03/01/2022 12:04:28 -------------------------------------------------------------------------------- SuperBill Details Patient Name: Date of Service: [name], [name] 03/01/2022 Medical Record Number: [id number] Patient Account Number: [id number] Date of Birth/Sex: Treating RN: 09/06/1946 (76 y.o. 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 chronic ulcer of right calf limited to breakdown of skin Facility Procedures Physician Procedures : CPT4 Code Description Modifier [id number] - WC PHYS LEVEL 4 - EST PT 25 ICD-10 Diagnosis Description L97.211 Non-pressure chronic ulcer of right calf limited to breakdown of skin I87.303 Chronic venous hypertension (idiopathic) without complications  of bilateral lower extremity I10 Essential (primary) hypertension Quantity: 1 : [id number] - WC PHYS DEBR WO ANESTH 20 SQ CM ICD-10 Diagnosis Description L97.211 Non-pressure chronic ulcer of right calf limited to breakdown of skin Quantity: 1 Electronic Signature(s) Signed: 03/01/2022 12:09:58 PM By: Fredirick Maudlin MD FACS Entered By: Fredirick Maudlin on 03/01/2022 12:09:58

## 2022-03-08 ENCOUNTER — Encounter (HOSPITAL_BASED_OUTPATIENT_CLINIC_OR_DEPARTMENT_OTHER): Payer: Medicare Other | Admitting: General Surgery

## 2022-03-08 DIAGNOSIS — E785 Hyperlipidemia, unspecified: Secondary | ICD-10-CM | POA: Diagnosis not present

## 2022-03-08 DIAGNOSIS — I87303 Chronic venous hypertension (idiopathic) without complications of bilateral lower extremity: Secondary | ICD-10-CM | POA: Diagnosis not present

## 2022-03-08 DIAGNOSIS — I2510 Atherosclerotic heart disease of native coronary artery without angina pectoris: Secondary | ICD-10-CM | POA: Diagnosis not present

## 2022-03-08 DIAGNOSIS — I10 Essential (primary) hypertension: Secondary | ICD-10-CM | POA: Diagnosis not present

## 2022-03-08 DIAGNOSIS — Z87891 Personal history of nicotine dependence: Secondary | ICD-10-CM | POA: Diagnosis not present

## 2022-03-08 DIAGNOSIS — L97218 Non-pressure chronic ulcer of right calf with other specified severity: Secondary | ICD-10-CM | POA: Diagnosis not present

## 2022-03-08 DIAGNOSIS — I87301 Chronic venous hypertension (idiopathic) without complications of right lower extremity: Secondary | ICD-10-CM | POA: Diagnosis not present

## 2022-03-08 DIAGNOSIS — L97211 Non-pressure chronic ulcer of right calf limited to breakdown of skin: Secondary | ICD-10-CM | POA: Diagnosis not present

## 2022-03-08 NOTE — Progress Notes (Signed)
[name], [name] ([id number]) Visit Report for 03/08/2022 Chief Complaint Document Details Patient Name: Date of Service: [name] 03/08/2022 9:30 A M Medical Record Number: [id number] Patient Account Number: [id number] Date of Birth/Sex: Treating RN: 1946-01-21 (76 y.o. [name] Primary Care Provider: Agustina Caroli Other Clinician: Referring Provider: Treating Provider/Extender: Irineo Axon Weeks in Treatment: 8 Information Obtained from: Patient Chief Complaint Right lower extremity wound Electronic Signature(s) Signed: 03/08/2022 9:56:41 AM By: Fredirick Maudlin MD FACS Entered By: Fredirick Maudlin on 03/08/2022 09:56:41 -------------------------------------------------------------------------------- HPI Details Patient Name: Date of Service: [name] 03/08/2022 9:30 A M Medical Record Number: [id number] Patient Account Number: [id number] Date of Birth/Sex: Treating RN: 14-Feb-1946 (76 y.o. [name] Primary Care Provider: Agustina Caroli Other Clinician: Referring Provider: Treating Provider/Extender: Irineo Axon Weeks in Treatment: 8 History of Present Illness HPI Description: Admission 08/23/2021 Mr. Sylas Twombly is a 76 year old male with a past medical history of essential hypertension, coronary artery disease and hyperlipidemia that presents to the clinic for a wound to his left lower extremity. He states that 2 months ago he scratched the back of his leg creating a small wound that has not healed. He states it drains serous fluid constantly. He has been using Neosporin to the area and keeping it covered. He recently saw his primary care physician and was recommended compression stockings and an antibiotic. Patient reports improvement in redness with the use of Duricef. He has been wearing his compression stockings for the past week. He is also on a diuretic for his hypertension. He denies signs of  infection. 4/21; patient tolerated the compression wrap well. He has no issues or complaints today. READMISSION 01/10/2022 Mr. Mulrooney is back with a new venous ulcer on the right calf. He healed fairly quickly from his last left sided wound and says that he has been wearing compression stockings, albeit intermittently. He thinks they are 15 to 20 mmHg pressure. Since his last wound care visit, the left-sided wound reopened, but he was able to get it closed on his own just using topical Neosporin. He attempted to do the same on the right, but it simply has persisted and his leg is fairly swollen and erythematous today. The wounds themselves are scattered and weeping serous fluid, consistent with venous stasis ulcers. 01/17/2022: The erythema and swelling on the right are improved this week after a course of Augmentin. He has developed new blisters on his posterior left leg that he says came up when he started wearing compression stockings, but these are only 15 to 20 mmHg pressure. There is some accumulated eschar on his wounds, but no significant slough. We have been using silver alginate and 3 layer compression. 01/24/2022: His wounds are all healed, but his juxta lite stockings did not arrive. 02/02/2022: His juxta lite stockings arrived but he only had 1 for the right leg; he did not have a second for his left. He [place] 1 from [place] and was wearing them bilaterally. Apparently the one from [place] is the one he used on his right leg and he subsequently developed an area of irritation, potentially from friction and has reopened new blister/ulcer lesions on his right lower extremity. They are clean but his skin is weeping. 02/08/2022: His right-sided wounds are nearly healed. They are very small and superficial and very clean. No weeping or drainage from the leg. 02/15/2022: His wounds are healed. 02/22/2022: Despite wearing compressive garments, he had a wound reopened on his right posterior calf. He  says  that it started out as blisters and then subsequently opened, similar to his prior events. It is in a slightly different location. He has never had venous reflux studies. The wounds are draining serous fluid. No concern for infection. 03/01/2022: He went for venous reflux studies and was found to have venous reflux in the right short saphenous vein, as well as extensive venous reflux throughout all of the superficial veins in his left lower extremity. The ulcer itself is smaller, but he is frustrated at the persistence of the problem. 03/08/2022: His wound is closed. He has still not heard anything from [facility] regarding an appointment with a surgeon. Electronic Signature(s) Signed: 03/08/2022 9:57:14 AM By: Fredirick Maudlin MD FACS Entered By: Fredirick Maudlin on 03/08/2022 09:57:14 -------------------------------------------------------------------------------- Physical Exam Details Patient Name: Date of Service: [name] 03/08/2022 9:30 A M Medical Record Number: [id number] Patient Account Number: [id number] Date of Birth/Sex: Treating RN: 05-20-1946 (76 y.o. [name] Primary Care Provider: Agustina Caroli Other Clinician: Referring Provider: Treating Provider/Extender: Irineo Axon Weeks in Treatment: 8 Constitutional Slightly hypertensive. Bradycardic, asymptomatic.. . . No acute distress. Respiratory Normal work of breathing on room air. Notes 03/08/2022: The wound is healed. Electronic Signature(s) Signed: 03/08/2022 9:57:46 AM By: Fredirick Maudlin MD FACS Entered By: Fredirick Maudlin on 03/08/2022 09:57:46 -------------------------------------------------------------------------------- Physician Orders Details Patient Name: Date of Service: HA, [name] 03/08/2022 9:30 A M Medical Record Number: [id number] Patient Account Number: [id number] Date of Birth/Sex: Treating RN: 04-05-1946 (76 y.o. 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 Edema Control - Lymphedema / SCD / Other Elevate legs to the level of the heart or above for 30 minutes daily and/or when sitting, a frequency of: - throughout the day Avoid standing for long periods of time. Patient to wear own compression stockings every day. - wear Juxtalite to R leg for a few days then can wear other compression sock Moisturize legs daily. - left leg daily Compression stocking or Garment 20-30 mm/Hg pressure to: - left leg daily Electronic Signature(s) Signed: 03/08/2022 9:57:56 AM By: Fredirick Maudlin MD FACS Entered By: Fredirick Maudlin on 03/08/2022 09:57:55 -------------------------------------------------------------------------------- Problem List Details Patient Name: Date of Service: HERMANN, DOTTAVIO Rich 03/08/2022 9:30 A M Medical Record Number: [id number] Patient Account Number: [id number] Date of Birth/Sex: Treating RN: 02/08/1946 (76 y.o. [name] Primary Care Provider: Agustina Caroli Other Clinician: Referring Provider: Treating Provider/Extender: Irineo Axon Weeks in Treatment: 8 Active Problems ICD-10 Encounter Code Description Active Date MDM Diagnosis I10 Essential (primary) hypertension 01/10/2022 No Yes I25.10 Atherosclerotic heart disease of native coronary artery without angina pectoris 01/10/2022 No Yes I87.303 Chronic venous hypertension (idiopathic) without complications of bilateral 01/10/2022 No Yes lower  extremity Inactive Problems ICD-10 Code Description Active Date Inactive Date L97.211 Non-pressure chronic ulcer of right calf limited to breakdown of skin 01/10/2022 01/10/2022 Resolved Problems Electronic Signature(s) Signed: 03/08/2022 9:56:31 AM By: Fredirick Maudlin MD FACS Entered By: Fredirick Maudlin on 03/08/2022 09:56:30 -------------------------------------------------------------------------------- Progress Note Details Patient Name: Date of Service: BLAISE, GRIESHABER Rich 03/08/2022 9:30 A M Medical Record Number: [id number] Patient Account Number: [id number] Date of Birth/Sex: Treating  RN: 12-07-1945 (76 y.o. [name] Primary Care Provider: Agustina Caroli Other Clinician: Referring Provider: Treating Provider/Extender: Irineo Axon Weeks in Treatment: 8 Subjective Chief Complaint Information obtained from Patient Right lower extremity wound History of Present Illness (HPI) Admission 08/23/2021 Mr. Arlando Leisinger is a 76 year old male with a past medical history of essential hypertension, coronary artery disease and hyperlipidemia that presents to the clinic for a wound to his left lower extremity. He states that 2 months ago he scratched the back of his leg creating a small wound that has not healed. He states it drains serous fluid constantly. He has been using Neosporin to the area and keeping it covered. He recently saw his primary care physician and was recommended compression stockings and an antibiotic. Patient reports improvement in redness with the use of Duricef. He has been wearing his compression stockings for the past week. He is also on a diuretic for his hypertension. He denies signs of infection. 4/21; patient tolerated the compression wrap well. He has no issues or complaints today. READMISSION 01/10/2022 Mr. Alderman is back with a new venous ulcer on the right calf. He healed fairly quickly from his last left sided wound and says that  he has been wearing compression stockings, albeit intermittently. He thinks they are 15 to 20 mmHg pressure. Since his last wound care visit, the left-sided wound reopened, but he was able to get it closed on his own just using topical Neosporin. He attempted to do the same on the right, but it simply has persisted and his leg is fairly swollen and erythematous today. The wounds themselves are scattered and weeping serous fluid, consistent with venous stasis ulcers. 01/17/2022: The erythema and swelling on the right are improved this week after a course of Augmentin. He has developed new blisters on his posterior left leg that he says came up when he started wearing compression stockings, but these are only 15 to 20 mmHg pressure. There is some accumulated eschar on his wounds, but no significant slough. We have been using silver alginate and 3 layer compression. 01/24/2022: His wounds are all healed, but his juxta lite stockings did not arrive. 02/02/2022: His juxta lite stockings arrived but he only had 1 for the right leg; he did not have a second for his left. He ordered 1 from [place] and was wearing them bilaterally. Apparently the one from [place] is the one he used on his right leg and he subsequently developed an area of irritation, potentially from friction and has reopened new blister/ulcer lesions on his right lower extremity. They are clean but his skin is weeping. 02/08/2022: His right-sided wounds are nearly healed. They are very small and superficial and very clean. No weeping or drainage from the leg. 02/15/2022: His wounds are healed. 02/22/2022: Despite wearing compressive garments, he had a wound reopened on his right posterior calf. He says that it started out as blisters and then subsequently opened, similar to his prior events. It is in a slightly different location. He has never had venous reflux studies. The wounds are draining serous fluid. No concern for infection. 03/01/2022: He went  for venous reflux studies and was found to have venous reflux in the right short saphenous vein, as well as extensive venous reflux throughout all of the superficial veins in his left lower extremity. The ulcer itself is smaller, but he is frustrated at the persistence of the problem. 03/08/2022: His wound is closed. He has still not heard anything from [facility]  regarding an appointment with a surgeon. Patient History Information obtained from Patient. Family History Unknown History. Social History Former smoker - quit 50 years ago, Marital Status - Married, Alcohol Use - Rarely, Drug Use - No History, Caffeine Use - Rarely. Medical History Eyes Denies history of Cataracts, Glaucoma, Optic Neuritis Ear/Nose/Mouth/Throat Denies history of Chronic sinus problems/congestion, Middle ear problems Hematologic/Lymphatic Denies history of Anemia, Hemophilia, Human Immunodeficiency Virus, Lymphedema, Sickle Cell Disease Respiratory Denies history of Aspiration, Asthma, Chronic Obstructive Pulmonary Disease (COPD), Pneumothorax, Sleep Apnea, Tuberculosis Cardiovascular Patient has history of Coronary Artery Disease - stents x 3, Hypertension Gastrointestinal Denies history of Cirrhosis , Colitis, Crohnoos, Hepatitis A, Hepatitis B, Hepatitis C Endocrine Denies history of Type I Diabetes, Type II Diabetes Genitourinary Denies history of End Stage Renal Disease Immunological Denies history of Lupus Erythematosus, Raynaudoos, Scleroderma Integumentary (Skin) Denies history of History of Burn Musculoskeletal Patient has history of Osteoarthritis Neurologic Denies history of Dementia, Neuropathy, Quadriplegia, Paraplegia, Seizure Disorder Oncologic Denies history of Received Chemotherapy, Received Radiation Psychiatric Denies history of Anorexia/bulimia, Confinement Anxiety Medical A Surgical History Notes nd Ear/Nose/Mouth/Throat hard of hearing Respiratory Hx Pulmonary  Embolism Cardiovascular Hypertension Oncologic Melanoma on back 2018 Objective Constitutional Slightly hypertensive. Bradycardic, asymptomatic.Marland Kitchen No acute distress. Vitals Time Taken: 9:22 AM, Temperature: 98.0 F, Pulse: 48 bpm, Respiratory Rate: 18 breaths/min, Blood Pressure: 147/89 mmHg. Respiratory Normal work of breathing on room air. General Notes: 03/08/2022: The wound is healed. Integumentary (Hair, Skin) Wound #7 status is Open. Original cause of wound was Blister. The date acquired was: 02/19/2022. The wound has been in treatment 2 weeks. The wound is located on the Right,Posterior Lower Leg. The wound measures 0cm length x 0cm width x 0cm depth; 0cm^2 area and 0cm^3 volume. There is no tunneling or undermining noted. There is a none present amount of drainage noted. The wound margin is flat and intact. There is no granulation within the wound bed. There is no necrotic tissue within the wound bed. 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 Edema Control - Lymphedema / SCD / Other: Elevate legs to the level of the heart or above for 30 minutes daily and/or when sitting, a frequency of: - throughout the day Avoid standing for long periods of time. Patient to wear own compression stockings every day. - wear Juxtalite to R leg for a few days then can wear other compression sock Moisturize legs daily. - left leg daily Compression stocking or Garment 20-30 mm/Hg pressure to: - left leg daily 03/08/2022: His wound has healed. I would like him to wear the juxta lite stocking on his right leg for the next few days to ensure that the wound remains closed. He can then switch to the self-customized combination of  stockings that he is currently wearing on his left leg. Hopefully he will hear from the vascular surgery clinic soon. For now, he will be discharged from the wound care clinic and I will see him back as needed. Electronic Signature(s) Signed: 03/08/2022 9:58:51 AM By: Fredirick Maudlin MD FACS Entered By: Fredirick Maudlin on 03/08/2022 09:58:51 -------------------------------------------------------------------------------- HxROS Details Patient Name: Date of Service: SHLOIMY, MICHALSKI Rich 03/08/2022 9:30 A M Medical Record Number: [id number] Patient Account Number: [id number] Date of Birth/Sex: Treating RN: 1946/03/16 (76 y.o. [name] Primary Care Provider: Agustina Caroli Other Clinician: Referring  Provider: Treating Provider/Extender: Maximino Sarin in Treatment: 8 Information Obtained From Patient Eyes Medical History: Negative for: Cataracts; Glaucoma; Optic Neuritis Ear/Nose/Mouth/Throat Medical History: Negative for: Chronic sinus problems/congestion; Middle ear problems Past Medical History Notes: hard of hearing Hematologic/Lymphatic Medical History: Negative for: Anemia; Hemophilia; Human Immunodeficiency Virus; Lymphedema; Sickle Cell Disease Respiratory Medical History: Negative for: Aspiration; Asthma; Chronic Obstructive Pulmonary Disease (COPD); Pneumothorax; Sleep Apnea; Tuberculosis Past Medical History Notes: Hx Pulmonary Embolism Cardiovascular Medical History: Positive for: Coronary Artery Disease - stents x 3; Hypertension Past Medical History Notes: Hypertension Gastrointestinal Medical History: Negative for: Cirrhosis ; Colitis; Crohns; Hepatitis A; Hepatitis B; Hepatitis C Endocrine Medical History: Negative for: Type I Diabetes; Type II Diabetes Genitourinary Medical History: Negative for: End Stage Renal Disease Immunological Medical History: Negative for: Lupus Erythematosus; Raynauds;  Scleroderma Integumentary (Skin) Medical History: Negative for: History of Burn Musculoskeletal Medical History: Positive for: Osteoarthritis Neurologic Medical History: Negative for: Dementia; Neuropathy; Quadriplegia; Paraplegia; Seizure Disorder Oncologic Medical History: Negative for: Received Chemotherapy; Received Radiation Past Medical History Notes: Melanoma on back 2018 Psychiatric Medical History: Negative for: Anorexia/bulimia; Confinement Anxiety Immunizations Pneumococcal Vaccine: Received Pneumococcal Vaccination: Yes Received Pneumococcal Vaccination On or After 60th Birthday: Yes Implantable Devices None Family and Social History Unknown History: Yes; Former smoker - quit 50 years ago; Marital Status - Married; Alcohol Use: Rarely; Drug Use: No History; Caffeine Use: Rarely; Financial Concerns: No; Food, Clothing or Shelter Needs: No; Support System Lacking: No; Transportation Concerns: No Electronic Signature(s) Signed: 03/08/2022 10:54:03 AM By: Fredirick Maudlin MD FACS Signed: 03/08/2022 4:48:28 PM By: Dellie Catholic RN Entered By: Fredirick Maudlin on 03/08/2022 09:57:20 -------------------------------------------------------------------------------- SuperBill Details Patient Name: Date of Service: TREY, GULBRANSON Rich 03/08/2022 Medical Record Number: [id number] Patient Account Number: [id number] Date of Birth/Sex: Treating RN: 1946/02/04 (76 y.o. 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

## 2022-03-09 NOTE — Progress Notes (Signed)
[name], [name] ([id number]) Visit Report for 03/08/2022 Arrival Information Details Patient Name: Date of Service: [name], [name] 03/08/2022 9:30 A M Medical Record Number: [id number] Patient Account Number: [id number] Date of Birth/Sex: Treating RN: [birth year] (76 y.o. [name] Primary Care Ivaan Liddy: Agustina Caroli Other Clinician: Referring Jsean Taussig: Treating Timber Marshman/Extender: Maximino Sarin in Treatment: 8 Visit Information History Since Last Visit Added or deleted any medications: No Patient Arrived: [name] Any new allergies or adverse reactions: No Arrival Time: 09:22 Had a fall or experienced change in No Accompanied By: wife activities of daily living that may affect Transfer Assistance: None risk of falls: Patient Identification Verified: Yes Signs or symptoms of abuse/neglect since last visito No Secondary Verification Process Completed: Yes Hospitalized since last visit: No Patient Requires Transmission-Based Precautions: No Implantable device outside of the clinic excluding No Patient Has Alerts: Yes cellular tissue based products placed in the center Patient Alerts: Patient on Blood Thinner since last visit: Xarelto Has Dressing in Place as Prescribed: Yes Pain Present Now: No Electronic Signature(s) Signed: 03/09/2022 3:27:54 PM By: Sandre Kitty Entered By: Sandre Kitty on 03/08/2022 09:22:59 -------------------------------------------------------------------------------- Clinic Level of Care Assessment Details Patient Name: Date of Service: [name] 03/08/2022 9:30 [place] Record Number: [id number] Patient Account Number: [id number] Date of Birth/Sex: Treating RN: Dec 21, 1945 (76 y.o. [name] Primary Care Nalu Troublefield: Agustina Caroli Other Clinician: Referring Brittni Hult: Treating Terris Germano/Extender: Maximino Sarin in Treatment: 8 Clinic Level of Care Assessment  Items TOOL 4 Quantity Score X- 1 0 Use when only an EandM is performed on FOLLOW-UP visit ASSESSMENTS - Nursing Assessment / Reassessment X- 1 10 Reassessment of Co-morbidities (includes updates in patient status) X- 1 5 Reassessment of Adherence to Treatment Plan ASSESSMENTS - Wound and Skin A ssessment / Reassessment X - Simple Wound Assessment / Reassessment - one wound 1 5 '[]'$  - 0 Complex Wound Assessment / Reassessment - multiple wounds '[]'$  - 0 Dermatologic / Skin Assessment (not related to wound area) ASSESSMENTS - Focused Assessment X- 1 5 Circumferential Edema Measurements - multi extremities '[]'$  - 0 Nutritional Assessment / Counseling / Intervention X- 1 5 Lower Extremity Assessment (monofilament, tuning fork, pulses) '[]'$  - 0 Peripheral Arterial Disease Assessment (using hand held doppler) ASSESSMENTS - Ostomy and/or Continence Assessment and Care '[]'$  - 0 Incontinence Assessment and Management '[]'$  - 0 Ostomy Care Assessment and Management (repouching, etc.) PROCESS - Coordination of Care X - Simple Patient / Family Education for ongoing care 1 15 '[]'$  - 0 Complex (extensive) Patient / Family Education for ongoing care X- 1 10 Staff obtains Consents, Records, T Results / Process Orders est '[]'$  - 0 Staff telephones HHA, Nursing Homes / Clarify orders / etc '[]'$  - 0 Routine Transfer to another Facility (non-emergent condition) '[]'$  - 0 Routine Hospital Admission (non-emergent condition) '[]'$  - 0 New Admissions / Biomedical engineer / Ordering NPWT Apligraf, etc. , '[]'$  - 0 Emergency Hospital Admission (emergent condition) X- 1 10 Simple Discharge Coordination '[]'$  - 0 Complex (extensive) Discharge Coordination PROCESS - Special Needs '[]'$  - 0 Pediatric / Minor Patient Management '[]'$  - 0 Isolation Patient Management '[]'$  - 0 Hearing / Language / Visual special needs '[]'$  - 0 Assessment of Community assistance (transportation, D/C planning, etc.) '[]'$  - 0 Additional  assistance / Altered mentation '[]'$  - 0 Support Surface(s) Assessment (bed, cushion, seat, etc.) INTERVENTIONS - Wound Cleansing / Measurement X - Simple Wound Cleansing - one wound 1 5 '[]'$  - 0 Complex Wound  Cleansing - multiple wounds X- 1 5 Wound Imaging (photographs - any number of wounds) '[]'$  - 0 Wound Tracing (instead of photographs) X- 1 5 Simple Wound Measurement - one wound '[]'$  - 0 Complex Wound Measurement - multiple wounds INTERVENTIONS - Wound Dressings '[]'$  - 0 Small Wound Dressing one or multiple wounds '[]'$  - 0 Medium Wound Dressing one or multiple wounds '[]'$  - 0 Large Wound Dressing one or multiple wounds '[]'$  - 0 Application of Medications - topical '[]'$  - 0 Application of Medications - injection INTERVENTIONS - Miscellaneous '[]'$  - 0 External ear exam '[]'$  - 0 Specimen Collection (cultures, biopsies, blood, body fluids, etc.) '[]'$  - 0 Specimen(s) / Culture(s) sent or taken to Lab for analysis '[]'$  - 0 Patient Transfer (multiple staff / Civil Service fast streamer / Similar devices) '[]'$  - 0 Simple Staple / Suture removal (25 or less) '[]'$  - 0 Complex Staple / Suture removal (26 or more) '[]'$  - 0 Hypo / Hyperglycemic Management (close monitor of Blood Glucose) '[]'$  - 0 Ankle / Brachial Index (ABI) - do not check if billed separately X- 1 5 Vital Signs Has the patient been seen at the hospital within the last three years: Yes Total Score: 85 Level Of Care: New/Established - Level 3 Electronic Signature(s) Signed: 03/08/2022 4:02:28 PM By: Adline Peals Entered By: Adline Peals on 03/08/2022 09:51:00 -------------------------------------------------------------------------------- Encounter Discharge Information Details Patient Name: Date of Service: [name], [name] 03/08/2022 9:30 A M Medical Record Number: [id number] Patient Account Number: [id number] Date of Birth/Sex: Treating RN: 13-Aug-1946 (76 y.o. [name] Primary Care Rajesh Wyss: Agustina Caroli Other  Clinician: Referring Czar Ysaguirre: Treating Uilani Sanville/Extender: Maximino Sarin in Treatment: 8 Encounter Discharge Information Items Discharge Condition: Stable Ambulatory Status: Ambulatory Discharge Destination: Home Transportation: Private Auto Accompanied By: wife Schedule Follow-up Appointment: Yes Clinical Summary of Care: Patient Declined Electronic Signature(s) Signed: 03/08/2022 4:02:28 PM By: Sabas Sous By: Adline Peals on 03/08/2022 09:45:59 -------------------------------------------------------------------------------- Lower Extremity Assessment Details Patient Name: Date of Service: [name], [name] 03/08/2022 9:30 A M Medical Record Number: [id number] Patient Account Number: [id number] Date of Birth/Sex: Treating RN: 1946/04/10 (76 y.o. [name] Primary Care Siddhartha Hoback: Agustina Caroli Other Clinician: Referring Rynell Ciotti: Treating Armarion Greek/Extender: Irineo Axon Weeks in Treatment: 8 Edema Assessment Assessed: [Left: No] [Right: No] Edema: [Left: Ye] [Right: s] Calf Left: Right: Point of Measurement: 43 cm From Medial Instep 44 cm 43.2 cm Ankle Left: Right: Point of Measurement: 27 cm From Medial Instep 24 cm 24.8 cm Vascular Assessment Pulses: Dorsalis Pedis Palpable: [Right:Yes] Electronic Signature(s) Signed: 03/08/2022 4:02:28 PM By: Adline Peals Entered By: Adline Peals on 03/08/2022 09:38:23 -------------------------------------------------------------------------------- Multi Wound Chart Details Patient Name: Date of Service: [name], [name] 03/08/2022 9:30 A M Medical Record Number: [id number] Patient Account Number: [id number] Date of Birth/Sex: Treating RN: 1946-05-01 (76 y.o. [name] Primary Care Vamsi Apfel: Agustina Caroli Other Clinician: Referring Derris Millan: Treating Ilene Witcher/Extender: Irineo Axon Weeks in Treatment:  8 Vital Signs Height(in): Pulse(bpm): 48 Weight(lbs): Blood Pressure(mmHg): 147/89 Body Mass Index(BMI): Temperature(F): 98.0 Respiratory Rate(breaths/min): 18 Photos: [N/A:N/A] Right, Posterior Lower Leg N/A N/A Wound Location: Blister N/A N/A Wounding Event: Venous Leg Ulcer N/A N/A Primary Etiology: Coronary Artery Disease, N/A N/A Comorbid History: Hypertension, Osteoarthritis 02/19/2022 N/A N/A Date Acquired: 2 N/A N/A Weeks of Treatment: Open N/A N/A Wound Status: No N/A N/A Wound Recurrence: 0x0x0 N/A N/A Measurements L x W x D (cm) 0 N/A N/A A (cm) : rea 0 N/A N/A Volume (cm) :  100.00% N/A N/A % Reduction in Area: 100.00% N/A N/A % Reduction in Volume: Full Thickness Without Exposed N/A N/A Classification: Support Structures None Present N/A N/A Exudate Amount: Flat and Intact N/A N/A Wound Margin: None Present (0%) N/A N/A Granulation Amount: None Present (0%) N/A N/A Necrotic Amount: Fascia: No N/A N/A Exposed Structures: Fat Layer (Subcutaneous Tissue): No Tendon: No Muscle: No Joint: No Bone: No Large (67-100%) N/A N/A Epithelialization: Treatment Notes Wound #7 (Lower Leg) Wound Laterality: Right, Posterior Cleanser Peri-Wound Care Topical Primary Dressing Secondary Dressing Secured With Compression Wrap Compression Stockings Add-Ons Electronic Signature(s) Signed: 03/08/2022 9:56:35 AM By: Fredirick Maudlin MD FACS Signed: 03/08/2022 4:48:28 PM By: Dellie Catholic RN Entered By: Fredirick Maudlin on 03/08/2022 09:56:35 -------------------------------------------------------------------------------- Multi-Disciplinary Care Plan Details Patient Name: Date of Service: [name], [name] 03/08/2022 9:30 A M Medical Record Number: [id number] Patient Account Number: [id number] Date of Birth/Sex: Treating RN: 1946/06/02 (76 y.o. [name] Primary Care Tearra Ouk: Agustina Caroli Other Clinician: Referring  Kyrah Schiro: Treating Abryana Lykens/Extender: Irineo Axon Weeks in Treatment: [place] reviewed with physician Active Inactive Electronic Signature(s) Signed: 03/08/2022 4:02:28 PM By: Sabas Sous By: Adline Peals on 03/08/2022 09:43:35 -------------------------------------------------------------------------------- Pain Assessment Details Patient Name: Date of Service: [name], [name] 03/08/2022 9:30 A M Medical Record Number: [id number] Patient Account Number: [id number] Date of Birth/Sex: Treating RN: [birth year] (76 y.o. [name] Primary Care Bradford Cazier: Agustina Caroli Other Clinician: Referring Jatavis Malek: Treating Judine Arciniega/Extender: Irineo Axon Weeks in Treatment: 8 Active Problems Location of Pain Severity and Description of Pain Patient Has Paino No Site Locations Pain Management and Medication Current Pain Management: Electronic Signature(s) Signed: 03/08/2022 4:48:28 PM By: Dellie Catholic RN Signed: 03/09/2022 3:27:54 PM By: Sandre Kitty Entered By: Sandre Kitty on 03/08/2022 09:23:19 -------------------------------------------------------------------------------- Patient/Caregiver Education Details Patient Name: Date of Service: [name] 5/30/2023andnbsp9:30 A M Medical Record Number: [id number] Patient Account Number: [id number] Date of Birth/Gender: Treating RN: 05-24-1946 (76 y.o. [name] Primary Care Physician: Agustina Caroli Other Clinician: Referring Physician: Treating Physician/Extender: Maximino Sarin in Treatment: 8 Education Assessment Education Provided To: Patient Education Topics Provided Wound/Skin Impairment: Methods: Explain/Verbal Responses: Reinforcements needed, State content correctly Electronic Signature(s) Signed: 03/08/2022 4:02:28 PM By: Adline Peals Entered By: Adline Peals on  03/08/2022 09:39:11 -------------------------------------------------------------------------------- Wound Assessment Details Patient Name: Date of Service: [name], [name] 03/08/2022 9:30 A M Medical Record Number: [id number] Patient Account Number: [id number] Date of Birth/Sex: Treating RN: [birth year] (76 y.o. [name] Primary Care Lem Peary: Agustina Caroli Other Clinician: Referring Kaniyah Lisby: Treating Anjoli Diemer/Extender: Irineo Axon Weeks in Treatment: 8 Wound Status Wound Number: 7 Primary Etiology: Venous Leg Ulcer Wound Location: Right, Posterior Lower Leg Wound Status: Open Wounding Event: Blister Comorbid History: Coronary Artery Disease, Hypertension, Osteoarthritis Date Acquired: 02/19/2022 Weeks Of Treatment: 2 Clustered Wound: No Photos Wound Measurements Length: (cm) Width: (cm) Depth: (cm) Area: (cm) Volume: (cm) 0 % Reduction in Area: 100% 0 % Reduction in Volume: 100% 0 Epithelialization: Large (67-100%) 0 Tunneling: No 0 Undermining: No Wound Description Classification: Full Thickness Without Exposed Support Structures Wound Margin: Flat and Intact Exudate Amount: None Present Foul Odor After Cleansing: No Slough/Fibrino No Wound Bed Granulation Amount: None Present (0%) Exposed Structure Necrotic Amount: None Present (0%) Fascia Exposed: No Fat Layer (Subcutaneous Tissue) Exposed: No Tendon Exposed: No Muscle Exposed: No Joint Exposed: No Bone Exposed: No Electronic Signature(s) Signed: 03/08/2022 4:02:28 PM By: Adline Peals Signed: 03/08/2022 4:48:28 PM By: Dellie Catholic RN  Entered By: Adline Peals on 03/08/2022 09:38:36 -------------------------------------------------------------------------------- Vitals Details Patient Name: Date of Service: [name], [name] 03/08/2022 9:30 A M Medical Record Number: [id number] Patient Account Number: [id number] Date of Birth/Sex: Treating RN: 1945-11-13  (76 y.o. [name] Primary Care Thaine Garriga: Agustina Caroli Other Clinician: Referring Laken Rog: Treating Neko Boyajian/Extender: Irineo Axon Weeks in Treatment: 8 Vital Signs Time Taken: 09:22 Temperature (F): 98.0 Pulse (bpm): 48 Respiratory Rate (breaths/min): 18 Blood Pressure (mmHg): 147/89 Reference Range: 80 - 120 mg / dl Electronic Signature(s) Signed: 03/09/2022 3:27:54 PM By: Sandre Kitty Entered By: Sandre Kitty on 03/08/2022 09:23:14

## 2022-03-10 ENCOUNTER — Encounter (HOSPITAL_BASED_OUTPATIENT_CLINIC_OR_DEPARTMENT_OTHER): Payer: Medicare Other | Attending: General Surgery | Admitting: General Surgery

## 2022-03-10 DIAGNOSIS — I2510 Atherosclerotic heart disease of native coronary artery without angina pectoris: Secondary | ICD-10-CM | POA: Insufficient documentation

## 2022-03-10 DIAGNOSIS — L97211 Non-pressure chronic ulcer of right calf limited to breakdown of skin: Secondary | ICD-10-CM | POA: Diagnosis not present

## 2022-03-10 DIAGNOSIS — I87301 Chronic venous hypertension (idiopathic) without complications of right lower extremity: Secondary | ICD-10-CM | POA: Diagnosis not present

## 2022-03-10 DIAGNOSIS — I87303 Chronic venous hypertension (idiopathic) without complications of bilateral lower extremity: Secondary | ICD-10-CM | POA: Insufficient documentation

## 2022-03-10 DIAGNOSIS — I10 Essential (primary) hypertension: Secondary | ICD-10-CM | POA: Insufficient documentation

## 2022-03-10 DIAGNOSIS — L97212 Non-pressure chronic ulcer of right calf with fat layer exposed: Secondary | ICD-10-CM | POA: Diagnosis not present

## 2022-03-10 NOTE — Progress Notes (Addendum)
TYDE, LAMISON ([id number]) Visit Report for 03/10/2022 Chief Complaint Document Details Patient Name: Date of Service: [name], [name] 03/10/2022 7:30 A M Medical Record Number: [id number] Patient Account Number: [id number] Date of Birth/Sex: Treating RN: 1946/03/19 (76 y.o. [name] Primary Care Provider: Agustina Rich Other Clinician: Referring Provider: Treating Provider/Extender: [name] Weeks in Treatment: 8 Information Obtained from: Patient Chief Complaint Right lower extremity wound Electronic Signature(s) Signed: 03/10/2022 8:14:56 AM By: Bryan Maudlin MD FACS Entered By: [name] on 03/10/2022 08:14:56 -------------------------------------------------------------------------------- HPI Details Patient Name: Date of Service: [name], [name] 03/10/2022 7:30 A M Medical Record Number: [id number] Patient Account Number: [id number] Date of Birth/Sex: Treating RN: January 27, 1946 (76 y.o. [name] Primary Care Provider: Agustina Rich Other Clinician: Referring Provider: Treating Provider/Extender: [name] Weeks in Treatment: 8 History of Present Illness HPI Description: Admission 08/23/2021 [name] is a 76 year old male with a past medical history of essential hypertension, coronary artery disease and hyperlipidemia that presents to the clinic for a wound to his left lower extremity. He states that 2 months ago he scratched the back of his leg creating a small wound that has not healed. He states it drains serous fluid constantly. He has been using Neosporin to the area and keeping it covered. He recently saw his primary care physician and was recommended compression stockings and an antibiotic. Patient reports improvement in redness with the use of Duricef. He has been wearing his compression stockings for the past week. He is also on a diuretic for his hypertension. He denies signs of  infection. 4/21; patient tolerated the compression wrap well. He has no issues or complaints today. READMISSION 01/10/2022 [name] is back with a new venous ulcer on the right calf. He healed fairly quickly from his last left sided wound and says that he has been wearing compression stockings, albeit intermittently. He thinks they are 15 to 20 mmHg pressure. Since his last wound care visit, the left-sided wound reopened, but he was able to get it closed on his own just using topical Neosporin. He attempted to do the same on the right, but it simply has persisted and his leg is fairly swollen and erythematous today. The wounds themselves are scattered and weeping serous fluid, consistent with venous stasis ulcers. 01/17/2022: The erythema and swelling on the right are improved this week after a course of Augmentin. He has developed new blisters on his posterior left leg that he says came up when he started wearing compression stockings, but these are only 15 to 20 mmHg pressure. There is some accumulated eschar on his wounds, but no significant slough. We have been using silver alginate and 3 layer compression. 01/24/2022: His wounds are all healed, but his juxta lite stockings did not arrive. 02/02/2022: His juxta lite stockings arrived but he only had 1 for the right leg; he did not have a second for his left. He [place] 1 from [place] and was wearing them bilaterally. Apparently the one from [place] is the one he used on his right leg and he subsequently developed an area of irritation, potentially from friction and has reopened new blister/ulcer lesions on his right lower extremity. They are clean but his skin is weeping. 02/08/2022: His right-sided wounds are nearly healed. They are very small and superficial and very clean. No weeping or drainage from the leg. 02/15/2022: His wounds are healed. 02/22/2022: Despite wearing compressive garments, he had a wound reopened on his right posterior calf. He  says  that it started out as blisters and then subsequently opened, similar to his prior events. It is in a slightly different location. He has never had venous reflux studies. The wounds are draining serous fluid. No concern for infection. 03/01/2022: He went for venous reflux studies and was found to have venous reflux in the right short saphenous vein, as well as extensive venous reflux throughout all of the superficial veins in his left lower extremity. The ulcer itself is smaller, but he is frustrated at the persistence of the problem. 03/08/2022: His wound is closed. He has still not heard anything from [facility] regarding an appointment with a surgeon. 03/10/2022: Unfortunately, Tuesday night after he got home, he developed new blisters on his posterior right calf. They are in a perfectly square orientation that he and his wife say corresponds to where the the Zetuvit pad had been placed when his leg was being wrapped. He also describes fairly intense pruritus in the site. There is a square area on his posterior right calf that is red and irritated-looking, suggesting a contact dermatitis reaction to this dressing. He also reports that he used to have some lymphedema pumps but he feels like they are old and not particularly effective and he is interested in obtaining a new set. Electronic Signature(s) Signed: 03/10/2022 8:17:46 AM By: Bryan Maudlin MD FACS Entered By: [name] on 03/10/2022 08:17:46 -------------------------------------------------------------------------------- Physical Exam Details Patient Name: Date of Service: [name], [name] 03/10/2022 7:30 A M Medical Record Number: [id number] Patient Account Number: [id number] Date of Birth/Sex: Treating RN: [birth year] (76 y.o. [name] Primary Care Provider: Agustina Rich Other Clinician: Referring Provider: Treating Provider/Extender: [name] Weeks in Treatment: 8 Constitutional Slightly  hypertensive. Bradycardic, asymptomatic.. . . No acute distress. Respiratory Normal work of breathing on room air. Notes 03/10/2022: On his posterior right calf, there is a perfectly square area of reddened, irritated skin. There are 3 small blisters in this area, none of which are open. He reports intense pruritus to the site. Electronic Signature(s) Signed: 03/10/2022 8:20:04 AM By: Bryan Maudlin MD FACS Entered By: [name] on 03/10/2022 08:20:04 -------------------------------------------------------------------------------- Physician Orders Details Patient Name: Date of Service: [name], [name] 03/10/2022 7:30 A M Medical Record Number: [id number] Patient Account Number: [id number] Date of Birth/Sex: Treating RN: 06-Oct-1946 (76 y.o. 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 - Dr. Celine Ahr - Room 3 Return A Bathing/ Shower/ Hygiene May shower with protection but do not get wound dressing(s) wet. - Ok to use Biomedical engineer, can purchase at [facility], [facility], or [employer] Edema Control - Lymphedema / SCD / Other Elevate legs to the level of the heart or above for 30 minutes daily and/or when sitting, a frequency of: - throughout the day Avoid standing for long periods of time. Moisturize legs daily. - legs daily Compression stocking or Garment 20-30 mm/Hg pressure to: Wound Treatment Wound #8 - Lower Leg Wound Laterality: Right,  Posterior Cleanser: Soap and Water Discharge Instructions: May shower and wash wound with dial antibacterial soap and water prior to dressing change. Peri-Wound Care: Triamcinolone  15 (g) Discharge Instructions: Use triamcinolone 15 (g) as directed Peri-Wound Care: Sween Lotion (Moisturizing lotion) Discharge Instructions: Apply moisturizing lotion as directed Prim Dressing: KerraCel Ag Gelling Fiber Dressing, 4x5 in (silver alginate) ary Discharge Instructions: Apply silver alginate to wound bed as instructed Secondary Dressing: ABD Pad, 8x10 Discharge Instructions: Apply over primary dressing as directed. Compression Wrap: ThreePress (3 layer compression wrap) Discharge Instructions: Apply three layer compression as directed. Electronic Signature(s) Signed: 03/10/2022 8:20:16 AM By: Bryan Maudlin MD FACS Entered By: [name] on 03/10/2022 08:20:16 -------------------------------------------------------------------------------- Problem List Details Patient Name: Date of Service: [name], [name] 03/10/2022 7:30 A M Medical Record Number: [id number] Patient Account Number: [id number] Date of Birth/Sex: Treating RN: 14-Feb-1946 (76 y.o. [name] Primary Care Provider: Agustina Rich Other Clinician: Referring Provider: Treating Provider/Extender: [name] Weeks in Treatment: 8 Active Problems ICD-10 Encounter Code Description Active Date MDM Diagnosis I10 Essential (primary) hypertension 01/10/2022 No Yes I25.10 Atherosclerotic heart disease of native coronary artery without angina pectoris 01/10/2022 No Yes I87.303 Chronic venous hypertension (idiopathic) without complications of bilateral 01/10/2022 No Yes lower extremity L97.211 Non-pressure chronic ulcer of right calf limited to breakdown of skin 01/10/2022 No Yes Inactive Problems Resolved Problems Electronic Signature(s) Signed: 03/10/2022 8:14:41 AM By: Bryan Maudlin MD  FACS Entered By: [name] on 03/10/2022 08:14:41 -------------------------------------------------------------------------------- Progress Note Details Patient Name: Date of Service: [name], [name] 03/10/2022 7:30 A M Medical Record Number: [id number] Patient Account Number: [id number] Date of Birth/Sex: Treating RN: July 10, 1946 (76 y.o. [name] Primary Care Provider: Agustina Rich Other Clinician: Referring Provider: Treating Provider/Extender: [name] Weeks in Treatment: 8 Subjective Chief Complaint Information obtained from Patient Right lower extremity wound History of Present Illness (HPI) Admission 08/23/2021 Mr. Alfard Cochrane is a 76 year old male with a past medical history of essential hypertension, coronary artery disease and hyperlipidemia that presents to the clinic for a wound to his left lower extremity. He states that 2 months ago he scratched the back of his leg creating a small wound that has not healed. He states it drains serous fluid constantly. He has been using Neosporin to the area and keeping it covered. He recently saw his primary care physician and was recommended compression stockings and an antibiotic. Patient reports improvement in redness with the use of Duricef. He has been wearing his compression stockings for the past week. He is also on a diuretic for his hypertension. He denies signs of infection. 4/21; patient tolerated the compression wrap well. He has no issues or complaints today. READMISSION 01/10/2022 Mr. Mcalexander is back with a new venous ulcer on the right calf. He healed fairly quickly from his last left sided wound and says that he has been wearing compression stockings, albeit intermittently. He thinks they are 15 to 20 mmHg pressure. Since his last wound care visit, the left-sided wound reopened, but he was able to get it closed on his own just using topical Neosporin. He attempted to do the same on  the right, but it simply has persisted and his leg is fairly swollen and erythematous today. The wounds themselves are scattered and weeping serous fluid, consistent with venous stasis ulcers. 01/17/2022: The erythema and swelling on the right are improved this week after a course of Augmentin. He has developed new blisters on his posterior left leg that he says came up when he started wearing compression stockings, but these are only 15 to 20 mmHg pressure. There is some accumulated eschar on his wounds,  but no significant slough. We have been using silver alginate and 3 layer compression. 01/24/2022: His wounds are all healed, but his juxta lite stockings did not arrive. 02/02/2022: His juxta lite stockings arrived but he only had 1 for the right leg; he did not have a second for his left. He [place] 1 [place] and was wearing them bilaterally. Apparently the one from [place] is the one he used on his right leg and he subsequently developed an area of irritation, potentially from friction and has reopened new blister/ulcer lesions on his right lower extremity. They are clean but his skin is weeping. 02/08/2022: His right-sided wounds are nearly healed. They are very small and superficial and very clean. No weeping or drainage from the leg. 02/15/2022: His wounds are healed. 02/22/2022: Despite wearing compressive garments, he had a wound reopened on his right posterior calf. He says that it started out as blisters and then subsequently opened, similar to his prior events. It is in a slightly different location. He has never had venous reflux studies. The wounds are draining serous fluid. No concern for infection. 03/01/2022: He went for venous reflux studies and was found to have venous reflux in the right short saphenous vein, as well as extensive venous reflux throughout all of the superficial veins in his left lower extremity. The ulcer itself is smaller, but he is frustrated at the persistence of the  problem. 03/08/2022: His wound is closed. He has still not heard anything from [facility] regarding an appointment with a surgeon. 03/10/2022: Unfortunately, Tuesday night after he got home, he developed new blisters on his posterior right calf. They are in a perfectly square orientation that he and his wife say corresponds to where the the Zetuvit pad had been placed when his leg was being wrapped. He also describes fairly intense pruritus in the site. There is a square area on his posterior right calf that is red and irritated-looking, suggesting a contact dermatitis reaction to this dressing. He also reports that he used to have some lymphedema pumps but he feels like they are old and not particularly effective and he is interested in obtaining a new set. Patient History Information obtained from Patient. Family History Unknown History. Social History Former smoker - quit 50 years ago, Marital Status - Married, Alcohol Use - Rarely, Drug Use - No History, Caffeine Use - Rarely. Medical History Eyes Denies history of Cataracts, Glaucoma, Optic Neuritis Ear/Nose/Mouth/Throat Denies history of Chronic sinus problems/congestion, Middle ear problems Hematologic/Lymphatic Denies history of Anemia, Hemophilia, Human Immunodeficiency Virus, Lymphedema, Sickle Cell Disease Respiratory Denies history of Aspiration, Asthma, Chronic Obstructive Pulmonary Disease (COPD), Pneumothorax, Sleep Apnea, Tuberculosis Cardiovascular Patient has history of Coronary Artery Disease - stents x 3, Hypertension Gastrointestinal Denies history of Cirrhosis , Colitis, Crohnoos, Hepatitis A, Hepatitis B, Hepatitis C Endocrine Denies history of Type I Diabetes, Type II Diabetes Genitourinary Denies history of End Stage Renal Disease Immunological Denies history of Lupus Erythematosus, Raynaudoos, Scleroderma Integumentary (Skin) Denies history of History of Burn Musculoskeletal Patient has history of  Osteoarthritis Neurologic Denies history of Dementia, Neuropathy, Quadriplegia, Paraplegia, Seizure Disorder Oncologic Denies history of Received Chemotherapy, Received Radiation Psychiatric Denies history of Anorexia/bulimia, Confinement Anxiety Medical A Surgical History Notes nd Ear/Nose/Mouth/Throat hard of hearing Respiratory Hx Pulmonary Embolism Cardiovascular Hypertension Oncologic Melanoma on back 2018 Objective Constitutional Slightly hypertensive. Bradycardic, asymptomatic.Marland Kitchen No acute distress. Vitals Time Taken: 7:50 AM, Temperature: 98.4 F, Pulse: 49 bpm, Respiratory Rate: 18 breaths/min, Blood Pressure: 142/83 mmHg. Respiratory Normal work  of breathing on room air. General Notes: 03/10/2022: On his posterior right calf, there is a perfectly square area of reddened, irritated skin. There are 3 small blisters in this area, none of which are open. He reports intense pruritus to the site. Integumentary (Hair, Skin) Wound #8 status is Open. Original cause of wound was Blister. The date acquired was: 03/10/2022. The wound is located on the Right,Posterior Lower Leg. The wound measures 1cm length x 1cm width x 0.1cm depth; 0.785cm^2 area and 0.079cm^3 volume. There is Fat Layer (Subcutaneous Tissue) exposed. There is no tunneling or undermining noted. There is a small amount of serous drainage noted. The wound margin is flat and intact. There is no granulation within the wound bed. There is no necrotic tissue within the wound bed. 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 - Dr. Celine Ahr - Room 3 Bathing/ Shower/ Hygiene: May shower with protection but do  not get wound dressing(s) wet. - Ok to use Biomedical engineer, can purchase at [facility], [facility], or [employer] Edema Control - Lymphedema / SCD / Other: Elevate legs to the level of the heart or above for 30 minutes daily and/or when sitting, a frequency of: - throughout the day Avoid standing for long periods of time. Moisturize legs daily. - legs daily Compression stocking or Garment 20-30 mm/Hg pressure to: WOUND #8: - Lower Leg Wound Laterality: Right, Posterior Cleanser: Soap and Water Discharge Instructions: May shower and wash wound with dial antibacterial soap and water prior to dressing change. Peri-Wound Care: Triamcinolone 15 (g) Discharge Instructions: Use triamcinolone 15 (g) as directed Peri-Wound Care: Sween Lotion (Moisturizing lotion) Discharge Instructions: Apply moisturizing lotion as directed Prim Dressing: KerraCel Ag Gelling Fiber Dressing, 4x5 in (silver alginate) ary Discharge Instructions: Apply silver alginate to wound bed as instructed Secondary Dressing: ABD Pad, 8x10 Discharge Instructions: Apply over primary dressing as directed. Com pression Wrap: ThreePress (3 layer compression wrap) Discharge Instructions: Apply three layer compression as directed. 03/10/2022: On his posterior right calf, there is a perfectly square area of reddened, irritated skin. There are 3 small blisters in this area, none of which are open. He reports intense pruritus to the site. I did not debride any tissue today. We have added Zetuvit pads to his list of allergies. We will stick to strictly ABD pads and cotton gauze for his dressings. We will use silver alginate and reapply 3 layer compression. We will also begin the process for application to get a new set of lymphedema pumps for him. Follow-up in 1 week. Electronic Signature(s) Signed: 03/10/2022 8:21:32 AM By: Bryan Maudlin MD FACS Entered By: [name] on 03/10/2022  08:21:32 -------------------------------------------------------------------------------- HxROS Details Patient Name: Date of Service: [name], [name] 03/10/2022 7:30 A M Medical Record Number: [id number] Patient Account Number: [id number] Date of Birth/Sex: Treating RN: [birth year] (76 y.o. [name] Primary Care Provider: Agustina Rich Other Clinician: Referring Provider: Treating Provider/Extender: [name] in Treatment: 8 Information Obtained From Patient Eyes Medical History: Negative for: Cataracts; Glaucoma; Optic Neuritis Ear/Nose/Mouth/Throat Medical History: Negative for: Chronic sinus problems/congestion; Middle ear problems Past Medical History Notes: hard of hearing Hematologic/Lymphatic Medical History: Negative for: Anemia; Hemophilia; Human Immunodeficiency Virus; Lymphedema; Sickle Cell Disease Respiratory Medical History: Negative for: Aspiration; Asthma; Chronic Obstructive Pulmonary Disease (  COPD); Pneumothorax; Sleep Apnea; Tuberculosis Past Medical History Notes: Hx Pulmonary Embolism Cardiovascular Medical History: Positive for: Coronary Artery Disease - stents x 3; Hypertension Past Medical History Notes: Hypertension Gastrointestinal Medical History: Negative for: Cirrhosis ; Colitis; Crohns; Hepatitis A; Hepatitis B; Hepatitis C Endocrine Medical History: Negative for: Type I Diabetes; Type II Diabetes Genitourinary Medical History: Negative for: End Stage Renal Disease Immunological Medical History: Negative for: Lupus Erythematosus; Raynauds; Scleroderma Integumentary (Skin) Medical History: Negative for: History of Burn Musculoskeletal Medical History: Positive for: Osteoarthritis Neurologic Medical History: Negative for: Dementia; Neuropathy; Quadriplegia; Paraplegia; Seizure Disorder Oncologic Medical History: Negative for: Received Chemotherapy; Received Radiation Past Medical History  Notes: Melanoma on back 2018 Psychiatric Medical History: Negative for: Anorexia/bulimia; Confinement Anxiety Immunizations Pneumococcal Vaccine: Received Pneumococcal Vaccination: Yes Received Pneumococcal Vaccination On or After 60th Birthday: Yes Implantable Devices None Family and Social History Unknown History: Yes; Former smoker - quit 50 years ago; Marital Status - Married; Alcohol Use: Rarely; Drug Use: No History; Caffeine Use: Rarely; Financial Concerns: No; Food, Clothing or Shelter Needs: No; Support System Lacking: No; Transportation Concerns: No Electronic Signature(s) Signed: 03/10/2022 8:58:31 AM By: Bryan Maudlin MD FACS Signed: 03/14/2022 5:13:49 PM By: Baruch Gouty RN, BSN Entered By: [name] on 03/10/2022 08:17:52 -------------------------------------------------------------------------------- SuperBill Details Patient Name: Date of Service: [name], [name] 03/10/2022 Medical Record Number: [id number] Patient Account Number: [id number] Date of Birth/Sex: Treating RN: 1946-06-04 (76 y.o. 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 RT LEG Modifier: Quantity: 1 Physician Procedures : CPT4 Code Description Modifier [id number] - WC PHYS LEVEL 3 - EST PT ICD-10 Diagnosis Description L97.211 Non-pressure chronic ulcer of right calf limited to breakdown of skin  I87.303 Chronic venous hypertension (idiopathic) without complications of  bilateral lower extremity I10 Essential (primary) hypertension I25.10 Atherosclerotic heart disease of native coronary artery without angina pectoris Quantity: 1 Electronic Signature(s) Signed: 03/10/2022 8:58:31 AM By: Bryan Maudlin MD FACS Signed: 03/10/2022 5:59:26 PM By: Sharyn Creamer RN, BSN Previous Signature: 03/10/2022 8:22:17 AM Version By: Bryan Maudlin MD FACS Entered By: Sharyn Creamer on 03/10/2022 08:49:44

## 2022-03-14 NOTE — Progress Notes (Signed)
[name], [name] ([id number]) Visit Report for 03/10/2022 Arrival Information Details Patient Name: Date of Service: [name], [name] 03/10/2022 7:30 A M Medical Record Number: [id number] Patient Account Number: [id number] Date of Birth/Sex: Treating RN: [birth year] (76 y.o. [name] Primary Care Karsin Pesta: Agustina Caroli Other Clinician: Referring Kaelob Persky: Treating Elizabet Schweppe/Extender: Maximino Sarin in Treatment: 8 Visit Information History Since Last Visit Added or deleted any medications: No Patient Arrived: Ambulatory Any new allergies or adverse reactions: No Arrival Time: 07:51 Had a fall or experienced change in No Accompanied By: wife activities of daily living that may affect Transfer Assistance: None risk of falls: Patient Identification Verified: Yes Signs or symptoms of abuse/neglect since last visito No Secondary Verification Process Completed: Yes Hospitalized since last visit: No Patient Requires Transmission-Based Precautions: No Implantable device outside of the clinic excluding No Patient Has Alerts: Yes cellular tissue based products placed in the center Patient Alerts: Patient on Blood Thinner since last visit: Xarelto Pain Present Now: No Electronic Signature(s) Signed: 03/10/2022 5:59:26 PM By: Sharyn Creamer RN, BSN Entered By: Sharyn Creamer on 03/10/2022 07:51:37 -------------------------------------------------------------------------------- Compression Therapy Details Patient Name: Date of Service: [name], [name] 03/10/2022 7:30 A M Medical Record Number: [id number] Patient Account Number: [id number] Date of Birth/Sex: Treating RN: [birth year] (76 y.o. [name] Primary Care Kadence Mikkelson: Agustina Caroli Other Clinician: Referring Jameal Razzano: Treating Terelle Dobler/Extender: Irineo Axon Weeks in Treatment: 8 Compression Therapy Performed for Wound Assessment: Wound #8 Right,Posterior Lower  Leg Performed By: Clinician Sharyn Creamer, RN Compression Type: Three Layer Post Procedure Diagnosis Same as Pre-procedure Electronic Signature(s) Signed: 03/10/2022 5:59:26 PM By: Sharyn Creamer RN, BSN Entered By: Sharyn Creamer on 03/10/2022 08:45:29 -------------------------------------------------------------------------------- Encounter Discharge Information Details Patient Name: Date of Service: [name], [name] 03/10/2022 7:30 A M Medical Record Number: [id number] Patient Account Number: [id number] Date of Birth/Sex: Treating RN: [birth year] (76 y.o. [name] Primary Care Gildo Crisco: Agustina Caroli Other Clinician: Referring Genasis Zingale: Treating Lucus Lambertson/Extender: Maximino Sarin in Treatment: 8 Encounter Discharge Information Items Discharge Condition: Stable Ambulatory Status: Ambulatory Discharge Destination: Home Transportation: Private Auto Accompanied By: wife Schedule Follow-up Appointment: Yes Clinical Summary of Care: Patient Declined Electronic Signature(s) Signed: 03/10/2022 5:59:26 PM By: Sharyn Creamer RN, BSN Entered By: Sharyn Creamer on 03/10/2022 08:52:06 -------------------------------------------------------------------------------- Lower Extremity Assessment Details Patient Name: Date of Service: [name], [name] 03/10/2022 7:30 A M Medical Record Number: [id number] Patient Account Number: [id number] Date of Birth/Sex: Treating RN: 04/03/1946 (76 y.o. [name] Primary Care Oneal Schoenberger: Agustina Caroli Other Clinician: Referring Arthella Headings: Treating Romell Cavanah/Extender: Irineo Axon Weeks in Treatment: 8 Edema Assessment Assessed: [Left: No] [Right: Yes] Edema: [Left: Ye] [Right: s] Calf Left: Right: Point of Measurement: From Medial Instep 45 cm Ankle Left: Right: Point of Measurement: From Medial Instep 26 cm Vascular Assessment Pulses: Dorsalis Pedis Palpable: [Right:Yes] Electronic  Signature(s) Signed: 03/10/2022 5:59:26 PM By: Sharyn Creamer RN, BSN Entered By: Sharyn Creamer on 03/10/2022 07:53:00 -------------------------------------------------------------------------------- Multi Wound Chart Details Patient Name: Date of Service: [name], [name] 03/10/2022 7:30 A M Medical Record Number: [id number] Patient Account Number: [id number] Date of Birth/Sex: Treating RN: 18-Mar-1946 (76 y.o. Ernestene Mention Primary Care Tinaya Ceballos: Agustina Caroli Other Clinician: Referring Hennesy Sobalvarro: Treating Kairen Hallinan/Extender: Irineo Axon Weeks in Treatment: 8 Vital Signs Height(in): Pulse(bpm): 32 Weight(lbs): Blood Pressure(mmHg): 142/83 Body Mass Index(BMI): Temperature(F): 98.4 Respiratory Rate(breaths/min): 18 Photos: [8:No Photos Right, Posterior Lower Leg] [N/A:N/A N/A] Wound Location: [8:Blister] [N/A:N/A] Wounding Event: [8:Lymphedema] [N/A:N/A] Primary Etiology: [8:Coronary Artery Disease,] [  N/A:N/A] Comorbid History: [8:Hypertension, Osteoarthritis 03/10/2022] [N/A:N/A] Date Acquired: [8:0] [N/A:N/A] Weeks of Treatment: [8:Open] [N/A:N/A] Wound Status: [8:No] [N/A:N/A] Wound Recurrence: [8:1x1x0.1] [N/A:N/A] Measurements L x W x D (cm) [8:0.785] [N/A:N/A] A (cm) : rea [8:0.079] [N/A:N/A] Volume (cm) : [8:Partial Thickness] [N/A:N/A] Classification: [8:Small] [N/A:N/A] Exudate A mount: [8:Serous] [N/A:N/A] Exudate Type: [8:amber] [N/A:N/A] Exudate Color: [8:Flat and Intact] [N/A:N/A] Wound Margin: [8:None Present (0%)] [N/A:N/A] Granulation A mount: [8:None Present (0%)] [N/A:N/A] Necrotic A mount: [8:Fat Layer (Subcutaneous Tissue): Yes N/A] Exposed Structures: [8:None] [N/A:N/A] Treatment Notes Electronic Signature(s) Signed: 03/10/2022 8:14:49 AM By: Fredirick Maudlin MD FACS Signed: 03/14/2022 5:13:49 PM By: Baruch Gouty RN, BSN Entered By: Fredirick Maudlin on 03/10/2022  08:14:49 -------------------------------------------------------------------------------- Multi-Disciplinary Care Plan Details Patient Name: Date of Service: [name], [name] 03/10/2022 7:30 A M Medical Record Number: [id number] Patient Account Number: [id number] Date of Birth/Sex: Treating RN: Feb 13, 1946 (76 y.o. [name] Primary Care Glender Augusta: Agustina Caroli Other Clinician: Referring Ephriam Turman: Treating Pete Schnitzer/Extender: Maximino Sarin in Treatment: 8 Multidisciplinary Care Plan reviewed with physician Active Inactive Abuse / Safety / Falls / Self Care Management Nursing Diagnoses: Knowledge deficit related to: safety; personal, health (wound), emergency Potential for falls Self care deficit: actual or potential Goals: Patient will remain injury free related to falls Date Initiated: 03/10/2022 Target Resolution Date: 04/06/2022 Goal Status: Active Patient/caregiver will verbalize understanding of skin care regimen Date Initiated: 03/10/2022 Target Resolution Date: 04/06/2022 Goal Status: Active Patient/caregiver will verbalize/demonstrate measures taken to improve the patient's personal safety Date Initiated: 03/10/2022 Target Resolution Date: 04/06/2022 Goal Status: Active Interventions: Provide education on basic hygiene Provide education on fall prevention Notes: Electronic Signature(s) Signed: 03/10/2022 5:59:26 PM By: Sharyn Creamer RN, BSN Entered By: Sharyn Creamer on 03/10/2022 12:45:38 -------------------------------------------------------------------------------- Non-Wound Condition Assessment Details Patient Name: Date of Service: [name], [name] 03/10/2022 7:30 A M Medical Record Number: [id number] Patient Account Number: [id number] Date of Birth/Sex: Treating RN: [birth year] (76 y.o. [name] Primary Care Marisela Line: Agustina Caroli Other Clinician: Referring Mataio Mele: Treating Oretta Berkland/Extender: Irineo Axon Weeks in Treatment: 8 Non-Wound Condition: Condition: Lymphedema Location: Leg Side: Right Photos Notes Blisters present, no open wounds Electronic Signature(s) Signed: 03/10/2022 5:59:26 PM By: Sharyn Creamer RN, BSN Entered By: Sharyn Creamer on 03/10/2022 07:57:12 -------------------------------------------------------------------------------- Pain Assessment Details Patient Name: Date of Service: [name], [name] 03/10/2022 7:30 A M Medical Record Number: [id number] Patient Account Number: [id number] Date of Birth/Sex: Treating RN: [birth year] (76 y.o. [name] Primary Care Dahlila Pfahler: Agustina Caroli Other Clinician: Referring Erhard Senske: Treating Corda Shutt/Extender: Irineo Axon Weeks in Treatment: 8 Active Problems Location of Pain Severity and Description of Pain Patient Has Paino No Site Locations Pain Management and Medication Current Pain Management: Electronic Signature(s) Signed: 03/10/2022 5:59:26 PM By: Sharyn Creamer RN, BSN Entered By: Sharyn Creamer on 03/10/2022 07:50:57 -------------------------------------------------------------------------------- Patient/Caregiver Education Details Patient Name: Date of Service: [name] 6/1/2023andnbsp7:30 A M Medical Record Number: [id number] Patient Account Number: [id number] Date of Birth/Gender: Treating RN: Feb 05, 1946 (76 y.o. [name] Primary Care Physician: Agustina Caroli Other Clinician: Referring Physician: Treating Physician/Extender: Maximino Sarin in Treatment: 8 Education Assessment Education Provided To: Patient Education Topics Provided Venous: Methods: Explain/Verbal Responses: State content correctly Wound/Skin Impairment: Methods: Explain/Verbal Responses: State content correctly Electronic Signature(s) Signed: 03/10/2022 5:59:26 PM By: Sharyn Creamer RN, BSN Signed: 03/10/2022 5:59:26 PM By:  Sharyn Creamer RN, BSN Entered By: Sharyn Creamer on 03/10/2022 08:47:35 -------------------------------------------------------------------------------- Wound Assessment Details Patient Name: Date of Service: [name]  03/10/2022 7:30 A M Medical Record Number: [id number] Patient Account Number: [id number] Date of Birth/Sex: Treating RN: 1945-10-25 (76 y.o. [name] Primary Care Mumtaz Lovins: Agustina Caroli Other Clinician: Referring Reesa Gotschall: Treating Kyran Connaughton/Extender: Irineo Axon Weeks in Treatment: 8 Wound Status Wound Number: 8 Primary Etiology: Lymphedema Wound Location: Right, Posterior Lower Leg Wound Status: Open Wounding Event: Blister Comorbid History: Coronary Artery Disease, Hypertension, Osteoarthritis Date Acquired: 03/10/2022 Weeks Of Treatment: 0 Clustered Wound: No Photos Wound Measurements Length: (cm) 1 Width: (cm) 1 Depth: (cm) 0.1 Area: (cm) 0.785 Volume: (cm) 0.079 % Reduction in Area: 0% % Reduction in Volume: 0% Epithelialization: None Tunneling: No Undermining: No Wound Description Classification: Partial Thickness Wound Margin: Flat and Intact Exudate Amount: Small Exudate Type: Serous Exudate Color: amber Foul Odor After Cleansing: No Slough/Fibrino No Wound Bed Granulation Amount: None Present (0%) Exposed Structure Necrotic Amount: None Present (0%) Fat Layer (Subcutaneous Tissue) Exposed: Yes Treatment Notes Wound #8 (Lower Leg) Wound Laterality: Right, Posterior Cleanser Soap and Water Discharge Instruction: May shower and wash wound with dial antibacterial soap and water prior to dressing change. Peri-Wound Care Triamcinolone 15 (g) Discharge Instruction: Use triamcinolone 15 (g) as directed Sween Lotion (Moisturizing lotion) Discharge Instruction: Apply moisturizing lotion as directed Topical Primary Dressing KerraCel Ag Gelling Fiber Dressing, 4x5 in (silver alginate) Discharge  Instruction: Apply silver alginate to wound bed as instructed Secondary Dressing ABD Pad, 8x10 Discharge Instruction: Apply over primary dressing as directed. Secured With Compression Wrap ThreePress (3 layer compression wrap) Discharge Instruction: Apply three layer compression as directed. Compression Stockings Add-Ons Electronic Signature(s) Signed: 03/10/2022 5:59:26 PM By: Sharyn Creamer RN, BSN Entered By: Sharyn Creamer on 03/10/2022 08:23:01 -------------------------------------------------------------------------------- [name] Details Patient Name: Date of Service: BRENN, DEZIEL Rich 03/10/2022 7:30 A M Medical Record Number: [id number] Patient Account Number: [id number] Date of Birth/Sex: Treating RN: July 03, 1946 (76 y.o. [name] Primary Care Claudina Oliphant: Agustina Caroli Other Clinician: Referring Fontaine Kossman: Treating Samah Lapiana/Extender: Irineo Axon Weeks in Treatment: 8 Vital Signs Time Taken: 07:50 Temperature (F): 98.4 Pulse (bpm): 49 Respiratory Rate (breaths/min): 18 Blood Pressure (mmHg): 142/83 Reference Range: 80 - 120 mg / dl Electronic Signature(s) Signed: 03/10/2022 5:59:26 PM By: Sharyn Creamer RN, BSN Entered By: Sharyn Creamer on 03/10/2022 07:50:44

## 2022-03-15 ENCOUNTER — Ambulatory Visit (INDEPENDENT_AMBULATORY_CARE_PROVIDER_SITE_OTHER): Payer: Medicare Other | Admitting: *Deleted

## 2022-03-15 DIAGNOSIS — I10 Essential (primary) hypertension: Secondary | ICD-10-CM

## 2022-03-15 DIAGNOSIS — E785 Hyperlipidemia, unspecified: Secondary | ICD-10-CM

## 2022-03-15 NOTE — Patient Instructions (Signed)
Visit Information  [name] and [name], thank you for taking time to talk with me today about [name]'s health care needs. Please don't hesitate to contact me if I can be of assistance to you before our next scheduled telephone appointment  Below are the goals we discussed today:  Patient Self-Care Activities: Patient [name] will: Take medications as prescribed Attend all scheduled provider appointments Call pharmacy for medication refills Call provider office for new concerns or questions Continue checking blood pressure weekly and write the results down on paper in a dedicated log, calendar or diary- we will review these with each telephone appointment; the blood pressures we reviewed today are all in good range Continue monitoring/ writing down your weights at home 1- 2 times per week Continue your efforts to follow heart healthy, low salt, low cholesterol diet Make small changes to your diet for lasting effect Stay as active as you are able while your right leg is healing; do not over-do activity-- pace yourself so you do not get over-tired If you have not heard from the scheduler at the vascular center by the end of this week, call them to schedule an appointment next week  Continue your efforts to prevent falls- use your cane when needed, as you have been doing  Our next scheduled telephone follow up visit/ appointment is scheduled on: Monday, April 18, 2022 at 9:45 am- This is a PHONE [place] appointment  If you need to cancel or re-schedule our visit, please call [phone] and our care guide team will be happy to assist you.   I look forward to hearing about your progress.   Oneta Rack, RN, BSN, [place] [phone]: direct office  If you are experiencing a Mental Health or [place] or need someone to talk to, please  call the Suicide and Crisis Lifeline: 988 call the [place] National Suicide  Prevention Lifeline: (770)645-8574 or TTY: 628-577-2352 TTY 820-069-0373) to talk to a trained counselor call 1-800-273-TALK (toll free, 24 hour hotline) go to [facility] [address] [place] [phone]) call 911   Patient verbalizes understanding of instructions and care plan provided today and agrees to view in [place]. Active MyChart status and patient understanding of how to access instructions and care plan via MyChart confirmed with patient    Venous Ulcer A venous ulcer is a shallow sore on your lower leg. Venous ulcer is the most common type of lower leg ulcer. You may have venous ulcers on one leg or on both legs. This condition most often develops around your ankles. This type of ulcer may last for a long time (chronic ulcer) or it may return often (recurrent ulcer). What are the causes? This condition is caused by poor blood flow in your legs. The poor flow causes blood to pool in your legs. This can break the skin, causing an ulcer. What increases the risk? You are more likely to develop this condition if: You are 23 years of age or older. You are male. You are overweight. You are not active. You have had a leg ulcer in the past. You have varicose veins. You have clots in your lower leg veins (deep vein thrombosis). You have inflammation of your leg veins (phlebitis). You have recently been pregnant. You smoke. What are the signs or symptoms? The main symptom of this condition is an open sore near your ankle. Other symptoms may include: Swelling. Thick skin. Fluid  coming from the ulcer. Bleeding. Itching. Pain and swelling. This gets worse when you stand up and feels better when you raise your leg. Blotchy skin. Dark skin. How is this treated? This condition may be treated by: Keeping your leg raised (elevated). Wearing a type of bandage or stocking to keep pressure (compression) on the veins of your leg. Taking  medicines, including antibiotic medicines. Cleaning your ulcer and removing any dead tissue from the wound. Using bandages and wraps that have medicines in them to cover your ulcer. Closing the wound using a piece of skin taken from another area of your body (graft). Follow these instructions at home: Medicines Take or apply over-the-counter and prescription medicines only as told by your doctor. If you were prescribed an antibiotic medicine, take it as told by your doctor. Do not stop using the antibiotic even if you start to feel better. Ask your doctor if you should take aspirin before long trips. Wound care Follow instructions from your doctor about how to take care of your wound. Make sure you: Wash your hands with soap and water before and after you change your bandage (dressing). If you cannot use soap and water, use hand sanitizer. Change your bandage as told by your doctor. If you had a skin graft, leave stitches (sutures) in place. These may need to stay in place for 2 weeks or longer. Ask when you should remove your bandage. If your bandage is dry and sticks to your leg when you try to remove it, moisten or wet the bandage with saline solution or water to make it easier to remove. Once your bandage is off, check your wound each day for signs of infection. Have a caregiver do this for you if you are not able to do it yourself. Check for: More redness, swelling, or pain. More fluid or blood. Warmth. Pus or a bad smell. Activity Do not sit for a long time without moving. Get up to take short walks every 1-2 hours. This is important. Ask for help if you feel weak or unsteady. Ask your doctor what level of activity is safe for you. Rest with your legs raised during the day. If you can, keep your legs above the level of your heart for 30 minutes, 3-4 times a day, or as told by your doctor. Do not sit with your legs crossed. General instructions  Wear elastic stockings, compression  stockings, or support hose as told by your doctor. Raise the foot of your bed as told by your doctor. Do not use any products that contain nicotine or tobacco, such as cigarettes, e-cigarettes, and chewing tobacco. If you need help quitting, ask your doctor. Keep all follow-up visits as told by your doctor. This is important. Contact a doctor if: Your ulcer is getting larger or is not healing. Your pain gets worse. Get help right away if: You have more redness, swelling, or pain around your ulcer. You have more fluid or blood coming from your ulcer. Your ulcer feels warm to the touch. You have pus or a bad smell coming from your ulcer. You have a fever. Summary A venous ulcer is a shallow sore on your lower leg. Follow instructions from your doctor about how to take care of your wound. Check your wound each day for signs of infection. Take over-the-counter and prescription medicines only as told by your doctor. Keep all follow-up visits as told by your doctor. This is important. This information is not intended to replace advice given  to you by your health care provider. Make sure you discuss any questions you have with your health care provider. Document Revised: 07/22/2021 Document Reviewed: 07/22/2021 Elsevier Patient Education  [place].

## 2022-03-15 NOTE — Chronic Care Management (AMB) (Signed)
Chronic Care Management   CCM RN Visit Note  03/15/2022 Name: [name] MRN: [id number] DOB: Jun 30, 1946  Subjective: [name] is a 76 y.o. year old male who is a primary care patient of [name], Bryan Bloomer, MD. The care management team was consulted for assistance with disease management and care coordination needs.    Engaged with patient by telephone for follow up visit in response to provider referral for case management and/or care coordination services.   Consent to Services:  The patient was given information about Chronic Care Management services, agreed to services, and gave verbal consent prior to initiation of services.  Please see initial visit note for detailed documentation.  Patient agreed to services and verbal consent obtained.   Assessment: Review of patient past medical history, allergies, medications, health status, including review of consultants reports, laboratory and other test data, was performed as part of comprehensive evaluation and provision of chronic care management services.   SDOH (Social Determinants of Health) assessments and interventions performed:  SDOH Interventions    Flowsheet Row Most Recent Value  SDOH Interventions   Food Insecurity Interventions Intervention Not Indicated  [continues to deny food insecurity]  Housing Interventions Intervention Not Indicated  [lives with spouse and daughter/ granddaughters in one level single family home,  denies housing concerns]  Transportation Interventions Intervention Not Indicated  [patient continues to drive self]     CCM Care Plan  No Known Allergies  Outpatient Encounter Medications as of 03/15/2022  Medication Sig Note   acetaminophen (TYLENOL) 325 MG tablet Take 2 tablets (650 mg total) by mouth every 6 (six) hours as needed for mild pain (or Fever >/= 101).    ALPRAZolam (XANAX) 0.5 MG tablet Take 1 tablet (0.5 mg total) by mouth daily as needed for anxiety.    Docusate Sodium (COLACE  PO) Take by mouth daily.    furosemide (LASIX) 40 MG tablet TAKE 1 TABLET (40 MG TOTAL) BY MOUTH DAILY. TAKE 1/2 TAB FOR 5 DAYS BEFORE TAKING WHOLE TAB.    lisinopril (ZESTRIL) 10 MG tablet Take 1 tablet (10 mg total) by mouth daily.    pantoprazole (PROTONIX) 40 MG tablet Take 1 tablet (40 mg total) by mouth daily.    rivaroxaban (XARELTO) 20 MG TABS tablet Take 1 tablet (20 mg total) by mouth daily with supper.    rosuvastatin (CRESTOR) 20 MG tablet Take 1 tablet (20 mg total) by mouth daily.    Sennosides (SENOKOT PO) Take by mouth daily. 10/28/2020: Senokot D   No facility-administered encounter medications on file as of 03/15/2022.   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[place]) 12/01/2016   CAD in native artery 08/31/2016   Essential hypertension 08/31/2016   Dyslipidemia 08/31/2016   Family history of heart disease in male family member before age 6 08/31/2016   Hearing loss, bilateral 08/31/2016   Generalized anxiety disorder 08/31/2016   History of colonic polyps 08/31/2016   Conditions to be addressed/monitored:  HTN and HLD  Care Plan : RN Care Manager Plan of Care  Updates made by Knox Royalty, RN since 03/15/2022 12:00 AM     Problem: Chronic Disease Management Needs   Priority: High     Long-Range Goal: Ongoing adherence to established plan of care for  long term chronic disease management   Start Date: 10/08/2021  Expected End Date: 10/08/2022  Priority: High  Note:   Current Barriers:  Chronic Disease Management support and education needs related to HTN and HLD Decreased motivation to increase activity, difficulty managing dietary portions, losing weight- continues to benefit from ongoing reinforcement and support HOH- wife assists  in management of care communications/ medications- as/ if needed  RNCM Clinical Goal(s):  Patient will demonstrate ongoing health management independence as evidenced by HTN; HLD        through collaboration with RN Care manager, provider, and care team.   Interventions: 1:1 collaboration with primary care provider regarding development and update of comprehensive plan of care as evidenced by provider attestation and co-signature Inter-disciplinary care team collaboration (see longitudinal plan of care) Evaluation of current treatment plan related to  self management and patient's adherence to plan as established by provider Review of patient status, including review of consultants reports, relevant laboratory and other test results, and medications completed SDOH updated: no new/ unmet concerns identified Depression screening updated: no concerns identified Pain assessment updated: denies pain today Falls assessment updated: continues to deny new/ recent falls x 12 months- continues using cane as/ if indicated;  positive reinforcement provided with encouragement to continue efforts at fall prevention; previously provided education around fall risks/ prevention reinforced Medications discussed: reports spouse continues to assist in medication management; denies current concerns/ issues/ questions around medications; endorses adherence to taking all medications as prescribed Reviewed recent PCP office visit 02/16/22: verbalizes good understanding of post-office visit instructions; denies questions; confirms no changes to general plan of care/ medications Reviewed recent ongoing wound clinic provider appointments- he has developed intermittently draining (R) leg ulcerations- reports "comes and goes;"  report wounds may be related to an allergy of the gauze being used to treat the wounds-- provider aware/ addressing Reviewed recent vascular tests 02/28/22- report "left leg is suspicious for venous  reflux; right leg is okay;" they are waiting to hear back from vascular  team for scheduling with vascular provider- confirm  they have contact information for vascular center: advised them to call themselves to schedule of they have not heard back by end of this week, after wound clinic appointment on Friday 03/18/22- they will do Reviewed upcoming scheduled provider appointments: 03/18/22- wound clinic; 08/17/22- PCP; patient confirms is aware of all and has plans to attend as scheduled Discussed plans with patient for ongoing care management follow up and provided patient with direct contact information for care management team     Hyperlipidemia:  (Status: 03/15/22: Goal on Track (progressing): YES.) Long Term Goal  Lab Results  Component Value Date   CHOL 125 11/09/2020   HDL 49 11/09/2020   [place] 56 11/09/2020   TRIG 113 11/09/2020   CHOLHDL 2.6 11/09/2020    Confirmed no recent/ new medication changes/ concerns: reports continues taking all medications as prescribed  Provider established cholesterol goals reviewed; Counseled on importance of regular laboratory monitoring as prescribed; Reviewed importance of limiting foods high in cholesterol; Reviewed exercise goals and target of 150 minutes per week;  Hypertension: (Status: 03/15/22: Goal on Track (progressing): YES.) Long Term Goal  Last practice recorded BP readings:  BP Readings from Last 3 Encounters:  02/16/22 130/70  08/18/21 120/70  02/18/21 124/62  Most recent eGFR/CrCl: No results found for: EGFR  No components found for: CRCL  Evaluation of current treatment plan related to hypertension self management and patient's adherence to plan as established by  provider;   Reviewed prescribed diet heart healthy, low salt, low cholesterol Discussed complications of poorly controlled blood pressure such as heart disease, stroke, circulatory complications, vision complications, kidney impairment, sexual dysfunction;  Confirmed patient  continues monitoring/ writing down on paper daily weights/ weekly blood pressures- reviewed today; he reports "all have been good at home;" report general ranges between "130-138/ 70-80," with this morning blood pressure of "127/68" Confirmed patient continues efforts to adhere to prescribed diet: reinforced previously provided strategies for adherence; positive reinforcement provided with encouragement to continue efforts Confirmed patient continues to stay as active as "possible;" he reports he is somewhat limited due to the current (R) leg wounds  Patient Goals/Self-Care Activities: As evidenced by review of EHR, collaboration with care team, and patient reporting during CCM RN CM outreach,  Patient [name] will: Take medications as prescribed Attend all scheduled provider appointments Call pharmacy for medication refills Call provider office for new concerns or questions Continue checking blood pressure weekly and write the results down on paper in a dedicated log, calendar or diary- we will review these with each telephone appointment; the blood pressures we reviewed today are all in good range Continue monitoring/ writing down your weights at home 1- 2 times per week Continue your efforts to follow heart healthy, low salt, low cholesterol diet Make small changes to your diet for lasting effect Stay as active as you are able while your right leg is healing; do not over-do activity-- pace yourself so you do not get over-tired If you have not heard from the scheduler at the vascular center by the end of this week, call them to schedule an appointment next week  Continue your efforts to prevent falls- use your cane when needed, as you have been doing    Plan: Telephone follow up appointment with care management team member scheduled for:  Monday, April 18, 2022 at 9:45 am The patient has been provided with contact information for the care management team and has been advised to call with any  health related questions or concerns  Oneta Rack, RN, BSN, [place] [phone]: direct office

## 2022-03-18 ENCOUNTER — Encounter (HOSPITAL_BASED_OUTPATIENT_CLINIC_OR_DEPARTMENT_OTHER): Payer: Medicare Other | Admitting: General Surgery

## 2022-03-18 DIAGNOSIS — L97211 Non-pressure chronic ulcer of right calf limited to breakdown of skin: Secondary | ICD-10-CM | POA: Diagnosis not present

## 2022-03-18 DIAGNOSIS — I87303 Chronic venous hypertension (idiopathic) without complications of bilateral lower extremity: Secondary | ICD-10-CM | POA: Diagnosis not present

## 2022-03-18 DIAGNOSIS — I10 Essential (primary) hypertension: Secondary | ICD-10-CM | POA: Diagnosis not present

## 2022-03-18 DIAGNOSIS — I2510 Atherosclerotic heart disease of native coronary artery without angina pectoris: Secondary | ICD-10-CM | POA: Diagnosis not present

## 2022-03-18 NOTE — Progress Notes (Signed)
GERVASE, COLBERG ([id number]) Visit Report for 03/18/2022 Allergy List Details Patient Name: Date of Service: RAFAY, DAHAN RLES 03/18/2022 12:30 PM Medical Record Number: [id number] Patient Account Number: [id number] Date of Birth/Sex: Treating RN: [birth year] (76 y.o. Ernestene Mention Primary Care Shayleen Eppinger: Agustina Caroli Other Clinician: Referring Mical Brun: Treating Miho Monda/Extender: Irineo Axon Weeks in Treatment: 9 Allergies Active Allergies No Known Drug Allergies zetivit Reaction: rash Type: Allergen Allergy Notes Electronic Signature(s) Signed: 03/18/2022 5:13:26 PM By: Baruch Gouty RN, BSN Entered By: Baruch Gouty on 03/18/2022 13:19:13 -------------------------------------------------------------------------------- Arrival Information Details Patient Name: Date of Service: IFEANYI, MICKELSON RLES 03/18/2022 12:30 PM Medical Record Number: [id number] Patient Account Number: [id number] Date of Birth/Sex: Treating RN: [birth year] (76 y.o. Ernestene Mention Primary Care Devereaux Grayson: Agustina Caroli Other Clinician: Referring Anshika Pethtel: Treating Nancy Manuele/Extender: Maximino Sarin in Treatment: 9 Visit Information Patient Arrived: Cane Arrival Time: 12:49 Accompanied By: spouse Transfer Assistance: None Patient Identification Verified: Yes Secondary Verification Process Completed: Yes Patient Requires Transmission-Based Precautions: No Patient Has Alerts: Yes Patient Alerts: Patient on Blood Thinner Xarelto History Since Last Visit Added or deleted any medications: No Any new allergies or adverse reactions: No Had a fall or experienced change in activities of daily living that may affect risk of falls: No Signs or symptoms of abuse/neglect since last visito No Hospitalized since last visit: No Implantable device outside of the clinic excluding cellular tissue based products placed in the center since last visit: No Has Dressing in  Place as Prescribed: Yes Has Compression in Place as Prescribed: Yes Pain Present Now: No Electronic Signature(s) Signed: 03/18/2022 5:13:26 PM By: Baruch Gouty RN, BSN Entered By: Baruch Gouty on 03/18/2022 12:50:17 -------------------------------------------------------------------------------- Clinic Level of Care Assessment Details Patient Name: Date of Service: KINNETH, FUJIWARA RLES 03/18/2022 12:30 PM Medical Record Number: [id number] Patient Account Number: [id number] Date of Birth/Sex: Treating RN: 1946/01/01 (76 y.o. Ernestene Mention Primary Care Bayne Fosnaugh: Agustina Caroli Other Clinician: Referring Britanny Marksberry: Treating Purity Irmen/Extender: Maximino Sarin in Treatment: 9 Clinic Level of Care Assessment Items TOOL 4 Quantity Score '[]'$  - 0 Use when only an EandM is performed on FOLLOW-UP visit ASSESSMENTS - Nursing Assessment / Reassessment X- 1 10 Reassessment of Co-morbidities (includes updates in patient status) X- 1 5 Reassessment of Adherence to Treatment Plan ASSESSMENTS - Wound and Skin A ssessment / Reassessment X - Simple Wound Assessment / Reassessment - one wound 1 5 '[]'$  - 0 Complex Wound Assessment / Reassessment - multiple wounds '[]'$  - 0 Dermatologic / Skin Assessment (not related to wound area) ASSESSMENTS - Focused Assessment X- 1 5 Circumferential Edema Measurements - multi extremities '[]'$  - 0 Nutritional Assessment / Counseling / Intervention X- 1 5 Lower Extremity Assessment (monofilament, tuning fork, pulses) '[]'$  - 0 Peripheral Arterial Disease Assessment (using hand held doppler) ASSESSMENTS - Ostomy and/or Continence Assessment and Care '[]'$  - 0 Incontinence Assessment and Management '[]'$  - 0 Ostomy Care Assessment and Management (repouching, etc.) PROCESS - Coordination of Care X - Simple Patient / Family Education for ongoing care 1 15 '[]'$  - 0 Complex (extensive) Patient / Family Education for ongoing care X- 1 10 Staff obtains  Programmer, systems, Records, T Results / Process Orders est '[]'$  - 0 Staff telephones HHA, Nursing Homes / Clarify orders / etc '[]'$  - 0 Routine Transfer to another Facility (non-emergent condition) '[]'$  - 0 Routine Hospital Admission (non-emergent condition) '[]'$  - 0 New Admissions / Biomedical engineer / Ordering NPWT Apligraf, etc. , '[]'$  - 0 Emergency Hospital Admission (emergent  condition) X- 1 10 Simple Discharge Coordination '[]'$  - 0 Complex (extensive) Discharge Coordination PROCESS - Special Needs '[]'$  - 0 Pediatric / Minor Patient Management '[]'$  - 0 Isolation Patient Management '[]'$  - 0 Hearing / Language / Visual special needs '[]'$  - 0 Assessment of Community assistance (transportation, D/C planning, etc.) '[]'$  - 0 Additional assistance / Altered mentation '[]'$  - 0 Support Surface(s) Assessment (bed, cushion, seat, etc.) INTERVENTIONS - Wound Cleansing / Measurement X- 1 5 Simple Wound Cleansing - one wound '[]'$  - 0 Complex Wound Cleansing - multiple wounds X- 1 5 Wound Imaging (photographs - any number of wounds) '[]'$  - 0 Wound Tracing (instead of photographs) '[]'$  - 0 Simple Wound Measurement - one wound '[]'$  - 0 Complex Wound Measurement - multiple wounds INTERVENTIONS - Wound Dressings '[]'$  - 0 Small Wound Dressing one or multiple wounds '[]'$  - 0 Medium Wound Dressing one or multiple wounds '[]'$  - 0 Large Wound Dressing one or multiple wounds '[]'$  - 0 Application of Medications - topical '[]'$  - 0 Application of Medications - injection INTERVENTIONS - Miscellaneous '[]'$  - 0 External ear exam '[]'$  - 0 Specimen Collection (cultures, biopsies, blood, body fluids, etc.) '[]'$  - 0 Specimen(s) / Culture(s) sent or taken to Lab for analysis '[]'$  - 0 Patient Transfer (multiple staff / Civil Service fast streamer / Similar devices) '[]'$  - 0 Simple Staple / Suture removal (25 or less) '[]'$  - 0 Complex Staple / Suture removal (26 or more) '[]'$  - 0 Hypo / Hyperglycemic Management (close monitor of Blood Glucose) '[]'$  -  0 Ankle / Brachial Index (ABI) - do not check if billed separately X- 1 5 Vital Signs Has the patient been seen at the hospital within the last three years: Yes Total Score: 80 Level Of Care: New/Established - Level 3 Electronic Signature(s) Signed: 03/18/2022 5:13:26 PM By: Baruch Gouty RN, BSN Entered By: Baruch Gouty on 03/18/2022 13:11:55 -------------------------------------------------------------------------------- Encounter Discharge Information Details Patient Name: Date of Service: YUCHEN, FEDOR RLES 03/18/2022 12:30 PM Medical Record Number: [id number] Patient Account Number: [id number] Date of Birth/Sex: Treating RN: 06-08-1946 (76 y.o. Ernestene Mention Primary Care Krisna Omar: Agustina Caroli Other Clinician: Referring Brett Soza: Treating Ben Sanz/Extender: Maximino Sarin in Treatment: 9 Encounter Discharge Information Items Discharge Condition: Stable Ambulatory Status: Cane Discharge Destination: Home Transportation: Private Auto Accompanied By: spouse Schedule Follow-up Appointment: Yes Clinical Summary of Care: Patient Declined Electronic Signature(s) Signed: 03/18/2022 5:13:26 PM By: Baruch Gouty RN, BSN Entered By: Baruch Gouty on 03/18/2022 13:16:49 -------------------------------------------------------------------------------- Lower Extremity Assessment Details Patient Name: Date of Service: BROADUS, COSTILLA RLES 03/18/2022 12:30 PM Medical Record Number: [id number] Patient Account Number: [id number] Date of Birth/Sex: Treating RN: 1946/07/05 (76 y.o. Ernestene Mention Primary Care Charity Tessier: Agustina Caroli Other Clinician: Referring Pharaoh Pio: Treating Kjersten Ormiston/Extender: Irineo Axon Weeks in Treatment: 9 Edema Assessment Assessed: [Left: No] [Right: No] Edema: [Left: Ye] [Right: s] Calf Left: Right: Point of Measurement: From Medial Instep 42 cm Ankle Left: Right: Point of Measurement: From Medial  Instep 24.5 cm Vascular Assessment Pulses: Dorsalis Pedis Palpable: [Right:Yes] Electronic Signature(s) Signed: 03/18/2022 5:13:26 PM By: Baruch Gouty RN, BSN Entered By: Baruch Gouty on 03/18/2022 12:58:32 -------------------------------------------------------------------------------- Multi Wound Chart Details Patient Name: Date of Service: CHARISTOPHER, RUMBLE RLES 03/18/2022 12:30 PM Medical Record Number: [id number] Patient Account Number: [id number] Date of Birth/Sex: Treating RN: 08-05-1946 (76 y.o. Ernestene Mention Primary Care Giavonna Pflum: Agustina Caroli Other Clinician: Referring Charlen Bakula: Treating Taneesha Edgin/Extender: Irineo Axon Weeks in Treatment: 9 Vital Signs Height(in): Pulse(bpm): 52 Weight(lbs):  Blood Pressure(mmHg): 136/85 Body Mass Index(BMI): Temperature(F): 98.3 Respiratory Rate(breaths/min): 18 Photos: [8:Right, Posterior Lower Leg] [N/A:N/A N/A] Wound Location: [8:Blister] [N/A:N/A] Wounding Event: [8:Lymphedema] [N/A:N/A] Primary Etiology: [8:Coronary Artery Disease,] [N/A:N/A] Comorbid History: [8:Hypertension, Osteoarthritis 03/10/2022] [N/A:N/A] Date Acquired: [8:1] [N/A:N/A] Weeks of Treatment: [8:Healed - Epithelialized] [N/A:N/A] Wound Status: [8:No] [N/A:N/A] Wound Recurrence: [8:0x0x0] [N/A:N/A] Measurements L x W x D (cm) [8:0] [N/A:N/A] A (cm) : rea [8:0] [N/A:N/A] Volume (cm) : [8:100.00%] [N/A:N/A] % Reduction in A rea: [8:100.00%] [N/A:N/A] % Reduction in Volume: [8:Partial Thickness] [N/A:N/A] Classification: [8:None Present] [N/A:N/A] Exudate A mount: [8:None Present (0%)] [N/A:N/A] Granulation A mount: [8:None Present (0%)] [N/A:N/A] Necrotic A mount: [8:Fascia: No] [N/A:N/A] Exposed Structures: [8:Fat Layer (Subcutaneous Tissue): No Tendon: No Muscle: No Joint: No Bone: No Large (67-100%)] [N/A:N/A] Treatment Notes Electronic Signature(s) Signed: 03/18/2022 1:12:30 PM By: Fredirick Maudlin MD FACS Signed:  03/18/2022 5:13:26 PM By: Baruch Gouty RN, BSN Entered By: Fredirick Maudlin on 03/18/2022 13:12:29 -------------------------------------------------------------------------------- [place] Details Patient Name: Date of Service: SERGE, MAIN RLES 03/18/2022 12:30 PM Medical Record Number: [id number] Patient Account Number: [id number] Date of Birth/Sex: Treating RN: December 13, 1945 (76 y.o. Ernestene Mention Primary Care Tomeko Scoville: Agustina Caroli Other Clinician: Referring Romey Cohea: Treating Mostyn Varnell/Extender: Irineo Axon Weeks in Treatment: [place] reviewed with physician Active Inactive Electronic Signature(s) Signed: 03/18/2022 5:13:26 PM By: Baruch Gouty RN, BSN Entered By: Baruch Gouty on 03/18/2022 13:02:02 -------------------------------------------------------------------------------- Pain Assessment Details Patient Name: Date of Service: YECHIEL, ERNY RLES 03/18/2022 12:30 PM Medical Record Number: [id number] Patient Account Number: [id number] Date of Birth/Sex: Treating RN: 1946-04-27 (76 y.o. Ernestene Mention Primary Care Yussuf Sawyers: Agustina Caroli Other Clinician: Referring Adler Alton: Treating Rayanna Matusik/Extender: Irineo Axon Weeks in Treatment: 9 Active Problems Location of Pain Severity and Description of Pain Patient Has Paino No Site Locations Rate the pain. Current Pain Level: 0 Pain Management and Medication Current Pain Management: Electronic Signature(s) Signed: 03/18/2022 5:13:26 PM By: Baruch Gouty RN, BSN Entered By: Baruch Gouty on 03/18/2022 12:51:15 -------------------------------------------------------------------------------- Patient/Caregiver Education Details Patient Name: Date of Service: FREDDRICK, GLADSON RLES 6/9/2023andnbsp12:30 PM Medical Record Number: [id number] Patient Account Number: [id number] Date of Birth/Gender: Treating RN: 1946/02/27 (76 y.o. Ernestene Mention Primary Care Physician: Agustina Caroli Other Clinician: Referring Physician: Treating Physician/Extender: Maximino Sarin in Treatment: 9 Education Assessment Education Provided To: Patient Education Topics Provided Venous: Methods: Explain/Verbal Responses: Reinforcements needed, State content correctly Wound/Skin Impairment: Methods: Explain/Verbal Responses: Reinforcements needed, State content correctly Electronic Signature(s) Signed: 03/18/2022 5:13:26 PM By: Baruch Gouty RN, BSN Entered By: Baruch Gouty on 03/18/2022 13:02:35 -------------------------------------------------------------------------------- Wound Assessment Details Patient Name: Date of Service: SAMIR, ISHAQ RLES 03/18/2022 12:30 PM Medical Record Number: [id number] Patient Account Number: [id number] Date of Birth/Sex: Treating RN: October 16, 1945 (76 y.o. Ernestene Mention Primary Care Madoc Holquin: Agustina Caroli Other Clinician: Referring Jacody Beneke: Treating Welcome Fults/Extender: Irineo Axon Weeks in Treatment: 9 Wound Status Wound Number: [id number] Primary Etiology: Lymphedema Wound Location: Right, Posterior Lower Leg Wound Status: Healed - Epithelialized Wounding Event: Blister Comorbid History: Coronary Artery Disease, Hypertension, Osteoarthritis Date Acquired: 03/10/2022 Weeks Of Treatment: 1 Clustered Wound: No Photos Wound Measurements Length: (cm) Width: (cm) Depth: (cm) Area: (cm) Volume: (cm) 0 % Reduction in Area: 100% 0 % Reduction in Volume: 100% 0 Epithelialization: Large (67-100%) 0 Tunneling: No 0 Undermining: No Wound Description Classification: Partial Thickness Exudate Amount: None Present Foul Odor After Cleansing: No Slough/Fibrino No Wound Bed Granulation Amount: None Present (0%) Exposed Structure Necrotic Amount: None Present (0%)  Fascia Exposed: No Fat Layer (Subcutaneous Tissue) Exposed: No Tendon Exposed:  No Muscle Exposed: No Joint Exposed: No Bone Exposed: No Electronic Signature(s) Signed: 03/18/2022 5:13:26 PM By: Baruch Gouty RN, BSN Entered By: Baruch Gouty on 03/18/2022 13:00:26 -------------------------------------------------------------------------------- [place] Details Patient Name: Date of Service: CORBEN, AUZENNE RLES 03/18/2022 12:30 PM Medical Record Number: [id number] Patient Account Number: [id number] Date of Birth/Sex: Treating RN: 06-Jul-1946 (76 y.o. Ernestene Mention Primary Care Edy Mcbane: Agustina Caroli Other Clinician: Referring Azael Ragain: Treating Catherene Kaleta/Extender: Irineo Axon Weeks in Treatment: 9 Vital Signs Time Taken: 12:50 Temperature (F): 98.3 Pulse (bpm): 52 Respiratory Rate (breaths/min): 18 Blood Pressure (mmHg): 136/85 Reference Range: 80 - 120 mg / dl Electronic Signature(s) Signed: 03/18/2022 5:13:26 PM By: Baruch Gouty RN, BSN Entered By: Baruch Gouty on 03/18/2022 12:50:55

## 2022-03-18 NOTE — Progress Notes (Signed)
[name] ([id number]) Visit Report for 03/18/2022 Chief Complaint Document Details Patient Name: Date of Service: [name], [name] 03/18/2022 12:30 PM Medical Record Number: [id number] Patient Account Number: [id number] Date of Birth/Sex: Treating RN: 1946/06/14 (76 y.o. Ernestene Mention Primary Care Provider: Agustina Caroli Other Clinician: Referring Provider: Treating Provider/Extender: Irineo Axon Weeks in Treatment: 9 Information Obtained from: Patient Chief Complaint Right lower extremity wound Electronic Signature(s) Signed: 03/18/2022 1:12:39 PM By: Fredirick Maudlin MD FACS Entered By: Fredirick Maudlin on 03/18/2022 13:12:39 -------------------------------------------------------------------------------- HPI Details Patient Name: Date of Service: [name] RLES 03/18/2022 12:30 PM Medical Record Number: [id number] Patient Account Number: [id number] Date of Birth/Sex: Treating RN: 1946-02-15 (76 y.o. Ernestene Mention Primary Care Provider: Agustina Caroli Other Clinician: Referring Provider: Treating Provider/Extender: Irineo Axon Weeks in Treatment: 9 History of Present Illness HPI Description: Admission 08/23/2021 Mr. Clemmie Marxen is a 76 year old male with a past medical history of essential hypertension, coronary artery disease and hyperlipidemia that presents to the clinic for a wound to his left lower extremity. He states that 2 months ago he scratched the back of his leg creating a small wound that has not healed. He states it drains serous fluid constantly. He has been using Neosporin to the area and keeping it covered. He recently saw his primary care physician and was recommended compression stockings and an antibiotic. Patient reports improvement in redness with the use of Duricef. He has been wearing his compression stockings for the past week. He is also on a diuretic for his hypertension. He denies signs of  infection. 4/21; patient tolerated the compression wrap well. He has no issues or complaints today. READMISSION 01/10/2022 Mr. Gerstner is back with a new venous ulcer on the right calf. He healed fairly quickly from his last left sided wound and says that he has been wearing compression stockings, albeit intermittently. He thinks they are 15 to 20 mmHg pressure. Since his last wound care visit, the left-sided wound reopened, but he was able to get it closed on his own just using topical Neosporin. He attempted to do the same on the right, but it simply has persisted and his leg is fairly swollen and erythematous today. The wounds themselves are scattered and weeping serous fluid, consistent with venous stasis ulcers. 01/17/2022: The erythema and swelling on the right are improved this week after a course of Augmentin. He has developed new blisters on his posterior left leg that he says came up when he started wearing compression stockings, but these are only 15 to 20 mmHg pressure. There is some accumulated eschar on his wounds, but no significant slough. We have been using silver alginate and 3 layer compression. 01/24/2022: His wounds are all healed, but his juxta lite stockings did not arrive. 02/02/2022: His juxta lite stockings arrived but he only had 1 for the right leg; he did not have a second for his left. He [place] 1 from [place] and was wearing them bilaterally. Apparently the one from [place] is the one he used on his right leg and he subsequently developed an area of irritation, potentially from friction and has reopened new blister/ulcer lesions on his right lower extremity. They are clean but his skin is weeping. 02/08/2022: His right-sided wounds are nearly healed. They are very small and superficial and very clean. No weeping or drainage from the leg. 02/15/2022: His wounds are healed. 02/22/2022: Despite wearing compressive garments, he had a wound reopened on his right posterior calf. He says  that it started out as blisters and then subsequently opened, similar to his prior events. It is in a slightly different location. He has never had venous reflux studies. The wounds are draining serous fluid. No concern for infection. 03/01/2022: He went for venous reflux studies and was found to have venous reflux in the right short saphenous vein, as well as extensive venous reflux throughout all of the superficial veins in his left lower extremity. The ulcer itself is smaller, but he is frustrated at the persistence of the problem. 03/08/2022: His wound is closed. He has still not heard anything from [facility] regarding an appointment with a surgeon. 03/10/2022: Unfortunately, Tuesday night after he got home, he developed new blisters on his posterior right calf. They are in a perfectly square orientation that he and his wife say corresponds to where the the Zetuvit pad had been placed when his leg was being wrapped. He also describes fairly intense pruritus in the site. There is a square area on his posterior right calf that is red and irritated-looking, suggesting a contact dermatitis reaction to this dressing. He also reports that he used to have some lymphedema pumps but he feels like they are old and not particularly effective and he is interested in obtaining a new set. 03/18/2022: We did not use a Zetuvit pad last week and as a result, his leg has healed completely. The company providing lymphedema pumps has been to his house and he will be getting a new set around the middle of July. Electronic Signature(s) Signed: 03/18/2022 1:13:25 PM By: Fredirick Maudlin MD FACS Entered By: Fredirick Maudlin on 03/18/2022 13:13:25 -------------------------------------------------------------------------------- Physical Exam Details Patient Name: Date of Service: [name] RLES 03/18/2022 12:30 PM Medical Record Number: [id number] Patient Account Number: [id number] Date of Birth/Sex: Treating RN: 04-Sep-1946 (76 y.o.  Ernestene Mention Primary Care Provider: Agustina Caroli Other Clinician: Referring Provider: Treating Provider/Extender: Irineo Axon Weeks in Treatment: 9 Constitutional . Bradycardic, asymptomatic.. . . No acute distress.Marland Kitchen Respiratory Normal work of breathing on room air.. Notes 03/18/2022: His wound is healed. Electronic Signature(s) Signed: 03/18/2022 1:14:00 PM By: Fredirick Maudlin MD FACS Entered By: Fredirick Maudlin on 03/18/2022 13:14:00 -------------------------------------------------------------------------------- Physician Orders Details Patient Name: Date of Service: ELISANDRO, JARRETT RLES 03/18/2022 12:30 PM Medical Record Number: [id number] Patient Account Number: [id number] Date of Birth/Sex: Treating RN: [birth year] (76 y.o. 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 Edema Control - Lymphedema / SCD / Other Lymphedema Pumps. Use Lymphedema pumps on leg(s) 2-3 times a day for 45-60 minutes. If wearing any wraps or hose, do not remove them. Continue exercising as instructed. - start using daily once available Elevate legs to the level of the heart or above for 30 minutes daily and/or when sitting, a frequency of: - throughout the day Avoid standing for long periods of time. Moisturize legs daily. - legs  daily Compression stocking or Garment 20-30 mm/Hg pressure to: - both legs daily Electronic Signature(s) Signed: 03/18/2022 1:14:34 PM By: Fredirick Maudlin MD FACS  Entered By: Fredirick Maudlin on 03/18/2022 13:14:34 -------------------------------------------------------------------------------- Problem List Details Patient Name: Date of Service: [name], [name] 03/18/2022 12:30 PM Medical Record Number: [id number] Patient Account Number: [id number] Date of Birth/Sex: Treating RN: Jan 30, 1946 (76 y.o. Ernestene Mention Primary Care Provider: Agustina Caroli Other Clinician: Referring Provider: Treating Provider/Extender: Irineo Axon Weeks in Treatment: 9 Active Problems ICD-10 Encounter Code Description Active Date MDM Diagnosis I10 Essential (primary) hypertension 01/10/2022 No Yes I25.10 Atherosclerotic heart disease of native coronary artery without angina pectoris 01/10/2022 No Yes I87.303 Chronic venous hypertension (idiopathic) without complications of bilateral 01/10/2022 No Yes lower extremity L97.211 Non-pressure chronic ulcer of right calf limited to breakdown of skin 01/10/2022 No Yes Inactive Problems Resolved Problems Electronic Signature(s) Signed: 03/18/2022 1:12:15 PM By: Fredirick Maudlin MD FACS Entered By: Fredirick Maudlin on 03/18/2022 13:12:15 -------------------------------------------------------------------------------- Progress Note Details Patient Name: Date of Service: MASSIAH, MINJARES RLES 03/18/2022 12:30 PM Medical Record Number: [id number] Patient Account Number: [id number] Date of Birth/Sex: Treating RN: 08/06/1946 (76 y.o. Ernestene Mention Primary Care Provider: Agustina Caroli Other Clinician: Referring Provider: Treating Provider/Extender: Irineo Axon Weeks in Treatment: 9 Subjective Chief Complaint Information obtained from Patient Right lower extremity wound History of Present Illness (HPI) Admission  08/23/2021 Mr. Navin Dogan is a 76 year old male with a past medical history of essential hypertension, coronary artery disease and hyperlipidemia that presents to the clinic for a wound to his left lower extremity. He states that 2 months ago he scratched the back of his leg creating a small wound that has not healed. He states it drains serous fluid constantly. He has been using Neosporin to the area and keeping it covered. He recently saw his primary care physician and was recommended compression stockings and an antibiotic. Patient reports improvement in redness with the use of Duricef. He has been wearing his compression stockings for the past week. He is also on a diuretic for his hypertension. He denies signs of infection. 4/21; patient tolerated the compression wrap well. He has no issues or complaints today. READMISSION 01/10/2022 Mr. Okane is back with a new venous ulcer on the right calf. He healed fairly quickly from his last left sided wound and says that he has been wearing compression stockings, albeit intermittently. He thinks they are 15 to 20 mmHg pressure. Since his last wound care visit, the left-sided wound reopened, but he was able to get it closed on his own just using topical Neosporin. He attempted to do the same on the right, but it simply has persisted and his leg is fairly swollen and erythematous today. The wounds themselves are scattered and weeping serous fluid, consistent with venous stasis ulcers. 01/17/2022: The erythema and swelling on the right are improved this week after a course of Augmentin. He has developed new blisters on his posterior left leg that he says came up when he started wearing compression stockings, but these are only 15 to 20 mmHg pressure. There is some accumulated eschar on his wounds, but no significant slough. We have been using silver alginate and 3 layer compression. 01/24/2022: His wounds are all healed, but his juxta lite stockings did  not arrive. 02/02/2022: His juxta lite stockings arrived but he only had 1 for the right leg; he did not have a second for his left. He [place] 1 [place] and was wearing them bilaterally. Apparently the one from [place] is the one he used on his right leg and he subsequently developed an area of irritation, potentially from  friction and has reopened new blister/ulcer lesions on his right lower extremity. They are clean but his skin is weeping. 02/08/2022: His right-sided wounds are nearly healed. They are very small and superficial and very clean. No weeping or drainage from the leg. 02/15/2022: His wounds are healed. 02/22/2022: Despite wearing compressive garments, he had a wound reopened on his right posterior calf. He says that it started out as blisters and then subsequently opened, similar to his prior events. It is in a slightly different location. He has never had venous reflux studies. The wounds are draining serous fluid. No concern for infection. 03/01/2022: He went for venous reflux studies and was found to have venous reflux in the right short saphenous vein, as well as extensive venous reflux throughout all of the superficial veins in his left lower extremity. The ulcer itself is smaller, but he is frustrated at the persistence of the problem. 03/08/2022: His wound is closed. He has still not heard anything from [facility] regarding an appointment with a surgeon. 03/10/2022: Unfortunately, Tuesday night after he got home, he developed new blisters on his posterior right calf. They are in a perfectly square orientation that he and his wife say corresponds to where the the Zetuvit pad had been placed when his leg was being wrapped. He also describes fairly intense pruritus in the site. There is a square area on his posterior right calf that is red and irritated-looking, suggesting a contact dermatitis reaction to this dressing. He also reports that he used to have some lymphedema pumps but he feels like  they are old and not particularly effective and he is interested in obtaining a new set. 03/18/2022: We did not use a Zetuvit pad last week and as a result, his leg has healed completely. The company providing lymphedema pumps has been to his house and he will be getting a new set around the middle of July. Patient History Information obtained from Patient. Family History Unknown History. Social History Former smoker - quit 50 years ago, Marital Status - Married, Alcohol Use - Rarely, Drug Use - No History, Caffeine Use - Rarely. Medical History Eyes Denies history of Cataracts, Glaucoma, Optic Neuritis Ear/Nose/Mouth/Throat Denies history of Chronic sinus problems/congestion, Middle ear problems Hematologic/Lymphatic Denies history of Anemia, Hemophilia, Human Immunodeficiency Virus, Lymphedema, Sickle Cell Disease Respiratory Denies history of Aspiration, Asthma, Chronic Obstructive Pulmonary Disease (COPD), Pneumothorax, Sleep Apnea, Tuberculosis Cardiovascular Patient has history of Coronary Artery Disease - stents x 3, Hypertension Gastrointestinal Denies history of Cirrhosis , Colitis, Crohnoos, Hepatitis A, Hepatitis B, Hepatitis C Endocrine Denies history of Type I Diabetes, Type II Diabetes Genitourinary Denies history of End Stage Renal Disease Immunological Denies history of Lupus Erythematosus, Raynaudoos, Scleroderma Integumentary (Skin) Denies history of History of Burn Musculoskeletal Patient has history of Osteoarthritis Neurologic Denies history of Dementia, Neuropathy, Quadriplegia, Paraplegia, Seizure Disorder Oncologic Denies history of Received Chemotherapy, Received Radiation Psychiatric Denies history of Anorexia/bulimia, Confinement Anxiety Medical A Surgical History Notes nd Ear/Nose/Mouth/Throat hard of hearing Respiratory Hx Pulmonary Embolism Cardiovascular Hypertension Oncologic Melanoma on back 2018 Objective Constitutional Bradycardic,  asymptomatic.Marland Kitchen No acute distress.. Vitals Time Taken: 12:50 PM, Temperature: 98.3 F, Pulse: 52 bpm, Respiratory Rate: 18 breaths/min, Blood Pressure: 136/85 mmHg. Respiratory Normal work of breathing on room air.. General Notes: 03/18/2022: His wound is healed. Integumentary (Hair, Skin) Wound #8 status is Healed - Epithelialized. Original cause of wound was Blister. The date acquired was: 03/10/2022. The wound has been in treatment 1 weeks. The wound is located on  the Right,Posterior Lower Leg. The wound measures 0cm length x 0cm width x 0cm depth; 0cm^2 area and 0cm^3 volume. There is no tunneling or undermining noted. There is a none present amount of drainage noted. There is no granulation within the wound bed. There is no necrotic tissue within the wound bed. 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 Edema Control - Lymphedema / SCD / Other: Lymphedema Pumps. Use Lymphedema pumps on leg(s) 2-3 times a day for 45-60 minutes. If wearing any wraps or hose, do not remove them. Continue exercising as instructed. - start using daily once available Elevate legs to the level of the heart or above for 30 minutes daily and/or when sitting, a frequency of: - throughout the day Avoid standing for long periods of time. Moisturize legs daily. - legs daily Compression stocking or Garment 20-30 mm/Hg pressure to: - both legs daily 03/18/2022: His wounds are healed. It seems that he was allergic to the contact layer dressing we were using. He will be getting new lymphedema pumps in July and he was  encouraged to use them at least twice a day for an hour each session. He should continue to wear compression stockings bilaterally and elevate his legs is much as possible. We will see him on an as-needed basis. Electronic Signature(s) Signed: 03/18/2022 1:15:20 PM By: Fredirick Maudlin MD FACS Entered By: Fredirick Maudlin on 03/18/2022 13:15:20 -------------------------------------------------------------------------------- HxROS Details Patient Name: Date of Service: PHIL, MICHELS RLES 03/18/2022 12:30 PM Medical Record Number: [id number] Patient Account Number: [id number] Date of Birth/Sex: Treating RN: Sep 17, 1946 (76 y.o. Ernestene Mention Primary Care Provider: Agustina Caroli Other Clinician: Referring Provider: Treating Provider/Extender: Maximino Sarin in Treatment: 9 Information Obtained From Patient Eyes Medical History: Negative for: Cataracts; Glaucoma; Optic Neuritis Ear/Nose/Mouth/Throat Medical History: Negative for: Chronic sinus problems/congestion; Middle ear problems Past Medical History Notes: hard of hearing Hematologic/Lymphatic Medical History: Negative for: Anemia; Hemophilia; Human Immunodeficiency Virus; Lymphedema; Sickle Cell Disease Respiratory Medical History: Negative for: Aspiration; Asthma; Chronic Obstructive Pulmonary Disease (COPD); Pneumothorax; Sleep Apnea; Tuberculosis Past Medical History Notes: Hx Pulmonary Embolism Cardiovascular Medical History: Positive for: Coronary Artery Disease - stents x 3; Hypertension Past Medical History Notes: Hypertension Gastrointestinal Medical History: Negative for: Cirrhosis ; Colitis; Crohns; Hepatitis A; Hepatitis B; Hepatitis C Endocrine Medical History: Negative for: Type I Diabetes; Type II Diabetes Genitourinary Medical History: Negative for: End Stage Renal Disease Immunological Medical History: Negative for: Lupus Erythematosus; Raynauds; Scleroderma Integumentary  (Skin) Medical History: Negative for: History of Burn Musculoskeletal Medical History: Positive for: Osteoarthritis Neurologic Medical History: Negative for: Dementia; Neuropathy; Quadriplegia; Paraplegia; Seizure Disorder Oncologic Medical History: Negative for: Received Chemotherapy; Received Radiation Past Medical History Notes: Melanoma on back 2018 Psychiatric Medical History: Negative for: Anorexia/bulimia; Confinement Anxiety Immunizations Pneumococcal Vaccine: Received Pneumococcal Vaccination: Yes Received Pneumococcal Vaccination On or After 60th Birthday: Yes Implantable Devices None Family and Social History Unknown History: Yes; Former smoker - quit 50 years ago; Marital Status - Married; Alcohol Use: Rarely; Drug Use: No History; Caffeine Use: Rarely; Financial Concerns: No; Food, Clothing or Shelter Needs: No; Support System Lacking: No; Transportation Concerns: No Engineer, maintenance) Signed: 03/18/2022 3:49:59 PM By: Fredirick Maudlin MD FACS  Signed: 03/18/2022 5:13:26 PM By: Baruch Gouty RN, BSN Entered By: Fredirick Maudlin on 03/18/2022 13:13:31 -------------------------------------------------------------------------------- SuperBill Details Patient Name: Date of Service: HARISH, BRAM RLES 03/18/2022 Medical Record Number: [id number] Patient Account Number: [id number] Date of Birth/Sex: Treating RN: [birth year] (76 y.o. 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 Facility  Procedures CPT4 Code: [id number] Description: 99213 - WOUND CARE VISIT-LEV 3 EST PT Modifier: Quantity: 1 Physician Procedures : CPT4 Code Description Modifier [id number] 99213 - WC PHYS LEVEL 3 - EST PT ICD-10 Diagnosis Description L97.211 Non-pressure chronic ulcer of right calf limited to breakdown of skin I87.303 Chronic venous hypertension (idiopathic) without complications of  bilateral lower extremity I10 Essential (primary) hypertension I25.10 Atherosclerotic heart disease of native coronary artery without angina pectoris Quantity: 1 Electronic Signature(s) Signed: 03/18/2022 3:49:59 PM By: Fredirick Maudlin MD FACS Signed: 03/18/2022 5:13:26 PM By: Baruch Gouty RN, BSN Previous Signature: 03/18/2022 1:15:36 PM Version By: Fredirick Maudlin MD FACS Entered By: Baruch Gouty on 03/18/2022 13:17:34

## 2022-03-24 ENCOUNTER — Encounter: Payer: Self-pay | Admitting: Vascular Surgery

## 2022-03-24 ENCOUNTER — Ambulatory Visit (INDEPENDENT_AMBULATORY_CARE_PROVIDER_SITE_OTHER): Payer: Medicare Other | Admitting: Vascular Surgery

## 2022-03-24 VITALS — BP 136/87 | HR 56 | Temp 98.4°F | Resp 18 | Ht 72.0 in | Wt 312.2 lb

## 2022-03-24 DIAGNOSIS — I872 Venous insufficiency (chronic) (peripheral): Secondary | ICD-10-CM

## 2022-03-24 NOTE — Progress Notes (Signed)
Patient ID: [name], male   DOB: 09/30/1946, 76 y.o.   MRN: [id number]  Reason for Consult: New Patient (Initial Visit) (Hx of ulcerations bilateral LE ulcerations/now healed     treated at [facility]  wearing compression hose )   Referred by Horald Pollen, *  Subjective:     HPI:  [name] is a 76 y.o. male has a history of bilateral lower extremity posterior medial leg wounds dating back to December.  Initially he had a wound on the right and then subsequent the left.  He has been wearing compression through the direction of the wound clinic since at least late March.  All of his wounds have now healed.  He does have bilateral lower extremity swelling of his legs as well.  He is sent for venous reflux evaluation.  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  He has never had any lower extremity venous or other procedures.  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   Family History  Problem Relation Age of Onset   Heart disease Mother 5       heart attack   Lung disease Father        asbestosis   Clotting disorder Father    Asthma Daughter    Hyperlipidemia Daughter    Alcohol abuse Paternal Uncle    Cancer Paternal Grandfather        lung   Past Surgical History:  Procedure Laterality Date   CORONARY ANGIOPLASTY WITH STENT [place]   right elbow   KNEE ARTHROSCOPY W/ MENISCAL REPAIR      Short Social History:  Social History   Tobacco Use   Smoking status: Former    Packs/day: 0.30    Types: Cigarettes    Start date: 08/31/1966    Quit date: 08/31/1969    Years since quitting: 52.5    Smokeless tobacco: Never  Substance Use Topics   Alcohol use: Yes    Alcohol/week: 3.0 standard drinks of alcohol    Types: 2 Cans of beer, 1 Shots of liquor per week    No Known Allergies  Current Outpatient Medications  Medication Sig Dispense Refill   acetaminophen (TYLENOL) 325 MG tablet Take 2 tablets (650 mg total) by mouth every 6 (six) hours as needed for mild pain (or Fever >/= 101). 12 tablet 0   ALPRAZolam (XANAX) 0.5 MG tablet Take 1 tablet (0.5 mg total) by mouth daily as needed for anxiety. 30 tablet 1   Docusate Sodium (COLACE PO) Take by mouth daily.     furosemide (LASIX) 40 MG tablet TAKE 1 TABLET (40 MG TOTAL) BY MOUTH DAILY. TAKE 1/2 TAB FOR 5 DAYS BEFORE TAKING WHOLE TAB. 90 tablet 1   lisinopril (ZESTRIL) 10 MG tablet Take 1 tablet (10 mg total) by mouth daily. 90 tablet 3   pantoprazole (PROTONIX) 40 MG tablet Take 1 tablet (40 mg total) by mouth daily. 90 tablet 3   rosuvastatin (CRESTOR) 20 MG tablet Take 1 tablet (20 mg total) by mouth  daily. 90 tablet 3   Sennosides (SENOKOT PO) Take by mouth daily.     rivaroxaban (XARELTO) 20 MG TABS tablet Take 1 tablet (20 mg total) by mouth daily with supper. 90 tablet 3   No current facility-administered medications for this visit.    Review of Systems  Constitutional:  Constitutional negative. HENT: HENT negative.  Eyes: Eyes negative.  Cardiovascular: Positive for leg swelling.  GI: Gastrointestinal negative.  Musculoskeletal: Musculoskeletal negative.  Skin: Positive for wound.  Neurological: Neurological negative. Hematologic: Hematologic/lymphatic negative.  Psychiatric: Psychiatric negative.        Objective:  Objective   Vitals:   03/24/22 0834  BP: 136/87  Pulse: (!) 56  Resp: 18  Temp: 98.4 F (36.9 C)  SpO2: 95%     Physical Exam HENT:     Head: Normocephalic.     Nose: Nose normal.  Eyes:     Pupils: Pupils are equal, round, and reactive to light.  Cardiovascular:     Rate and  Rhythm: Normal rate.     Pulses: Normal pulses.  Abdominal:     General: Abdomen is flat.  Musculoskeletal:        General: Normal range of motion.     Cervical back: Normal range of motion.     Right lower leg: Edema present.     Left lower leg: Edema present.  Skin:    Capillary Refill: Capillary refill takes less than 2 seconds.     Comments: Healed medial and posterior leg wounds bilaterally  Neurological:     General: No focal deficit present.     Mental Status: He is alert.  Psychiatric:        Mood and Affect: Mood normal.     Data: Venous Reflux Times  +--------------+---------+------+-----------+------------+-----------------  ----+  RIGHT         Reflux NoRefluxReflux TimeDiameter cmsComments                                        Yes                                                 +--------------+---------+------+-----------+------------+-----------------  ----+  CFV           no                                                            +--------------+---------+------+-----------+------------+-----------------  ----+  FV mid        no                                                            +--------------+---------+------+-----------+------------+-----------------  ----+  Popliteal     no                                                            +--------------+---------+------+-----------+------------+-----------------  ----+  GSV at [facility]    no                            0.78                            +--------------+---------+------+-----------+------------+-----------------  ----+  GSV prox thighno                            0.60                            +--------------+---------+------+-----------+------------+-----------------  ----+  GSV mid thigh no                            0.67                            +--------------+---------+------+-----------+------------+-----------------   ----+  GSV dist thighno                            0.54    out of fascia            +--------------+---------+------+-----------+------------+-----------------  ----+  GSV at knee   no                            0.58    out of fascia            +--------------+---------+------+-----------+------------+-----------------  ----+  GSV prox calf no                            0.52    out of fascia            +--------------+---------+------+-----------+------------+-----------------  ----+  SSV Pop Fossa           yes    >500 ms      0.56    thigh extension          +--------------+---------+------+-----------+------------+-----------------  ----+  SSV prox calf no                            0.86                            +--------------+---------+------+-----------+------------+-----------------  ----+  SSV mid calf                                        unable to  visualize                                                        due to wrap              +--------------+---------+------+-----------+------------+-----------------  ----+      +--------------+---------+------+-----------+------------+---------------+  LEFT          Reflux NoRefluxReflux TimeDiameter cmsComments  Yes                                          +--------------+---------+------+-----------+------------+---------------+  CFV           no                                                     +--------------+---------+------+-----------+------------+---------------+  FV mid        no                                                     +--------------+---------+------+-----------+------------+---------------+  Popliteal     no                                                     +--------------+---------+------+-----------+------------+---------------+  GSV at SFJ              yes    >500 ms       1.45                     +--------------+---------+------+-----------+------------+---------------+  GSV prox thigh          yes    >500 ms      0.77                     +--------------+---------+------+-----------+------------+---------------+  GSV mid thigh           yes    >500 ms      0.70                     +--------------+---------+------+-----------+------------+---------------+  GSV dist thighno                            0.66                     +--------------+---------+------+-----------+------------+---------------+  GSV at knee             yes    >500 ms      0.75                     +--------------+---------+------+-----------+------------+---------------+  GSV prox calf           yes    >500 ms      0.56                     +--------------+---------+------+-----------+------------+---------------+  SSV Pop Fossa           yes    >500 ms      0.42    thigh extension  +--------------+---------+------+-----------+------------+---------------+  SSV prox calf           yes    >500 ms      0.53                     +--------------+---------+------+-----------+------------+---------------+  SSV mid calf            yes    >500 ms      0.34                     +--------------+---------+------+-----------+------------+---------------+           Summary:  Right:  - No evidence of deep vein thrombosis seen in the right lower extremity,  from the common femoral through the popliteal veins.  - No evidence of superficial venous thrombosis in the right lower  extremity.  - Venous reflux is noted in the right short saphenous vein.      Left:  - No evidence of deep vein thrombosis seen in the left lower extremity,  from the common femoral through the popliteal veins.  - No evidence of superficial venous thrombosis in the left lower  extremity.  - Venous reflux is noted in the left sapheno-femoral junction.  - Venous reflux  is noted in the left greater saphenous vein in the thigh.  - Venous reflux is noted in the left greater saphenous vein in the calf.  - Venous reflux is noted in the left short saphenous vein.         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  I reviewed his great saphenous vein today and it is very large down to the level of the knee where it branches and exits the fascia.  I discussed with him treatment of wounds is multifaceted and one component particularly in his left leg will be treating the refluxing greater saphenous vein to prevent wound recurrence.  We specifically discussed endovenous laser ablation of the great saphenous vein on the left.  He will need continued weight loss and compression stockings even after treatment.  We discussed the procedural specifics including the need for 48 hours of compression and holding Xarelto 72 hours before and likely 48 hours after to prevent any recurrence of venous reflux.  All of his questions were answered in the presence of his wife and he demonstrates good understanding.     Waynetta Sandy MD [facility]

## 2022-03-28 NOTE — Progress Notes (Unsigned)
Cardiology Office Note   Date:  03/30/2022   ID:  [name], DOB [birth year], MRN [id number]  PCP:  Blanchie Serve, MD  Cardiologist:   Dorris Carnes, MD   PT presents for f/u of CAD    History of Present Illness: [name] is a 76 y.o. male with a history of CAD  Cath in 2010:  Mild dz LAD; 70% OM lesion:  RCA: 60 to 70% prox RCA; 95% distal RCA.  Pt underwent BMS x 2 to RCA   (Symptoms of angina:  discomfort in chest, then arms)      Pt also has a history of HTN, HL, TIA  FHx CAD    In Nov 2020 the pt was raking  Had CP/ SOB     CT done that showed a saddle PE    Pt considered for lytic Rx   Transferred to [facility] instead on anticoagulant Rx  The pt denied any injury, LE swelling , prior long trip   I saw the pt in Jan 2022   He says his breathing is OK  Denies CP   No dizzienss     Being evaluated by Corinna Gab for vein surgery in August    Will need to be off of Eliquis    Current Meds  Medication Sig   acetaminophen (TYLENOL) 325 MG tablet Take 2 tablets (650 mg total) by mouth every 6 (six) hours as needed for mild pain (or Fever >/= 101).   ALPRAZolam (XANAX) 0.5 MG tablet Take 1 tablet (0.5 mg total) by mouth daily as needed for anxiety.   Docusate Sodium (COLACE PO) Take by mouth daily.   furosemide (LASIX) 40 MG tablet TAKE 1 TABLET (40 MG TOTAL) BY MOUTH DAILY. TAKE 1/2 TAB FOR 5 DAYS BEFORE TAKING WHOLE TAB.   lisinopril (ZESTRIL) 10 MG tablet Take 1 tablet (10 mg total) by mouth daily.   pantoprazole (PROTONIX) 40 MG tablet Take 1 tablet (40 mg total) by mouth daily.   rivaroxaban (XARELTO) 20 MG TABS tablet Take 1 tablet (20 mg total) by mouth daily with supper.   rosuvastatin (CRESTOR) 20 MG tablet Take 1 tablet (20 mg total) by mouth daily.   Sennosides (SENOKOT PO) Take by mouth daily.     Allergies:   Patient has no known allergies.   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 His smoking use included cigarettes. He started smoking about 55 years ago. He smoked an average of .3 packs per day. He has never used smokeless tobacco. He reports current alcohol use of about 3.0 standard drinks of alcohol per week. He reports that he does not use drugs.   Family History:  The patient's family history includes Alcohol abuse in his paternal uncle; Asthma in his daughter; Cancer in his paternal grandfather; Clotting disorder  in his father; Heart disease (age of onset: 12) in his mother; Hyperlipidemia in his daughter; Lung disease in his father.    ROS:  Please see the history of present illness. All other systems are reviewed and  Negative to the above problem except as noted.    PHYSICAL EXAM: VS:  BP 118/72   Pulse (!) 59   Ht '6\' 2"'$  (1.88 m)   Wt (!) 313 lb (142 kg)   SpO2 96%   BMI 40.19 kg/m   GEN: Morbidly obese 76 yo  in no acute distress  HEENT: normal  Neck: no JVD, NO carotid bruits Cardiac: RRR; no murmurs  Tr edema Wearing support socks   Respiratory:  clear to auscultation bilaterally,  GI: soft, nontender, distended, + BS  No hepatomegaly  MS: no deformity Moving all extremities   Skin: warm and dry, no rash Neuro:  Strength and sensation are intact Psych: euthymic mood, full affect   EKG:  EKG is ordered today.    SB 59 bpm       Lipid Panel    Component Value Date/Time   CHOL 125 11/09/2020 1127   TRIG 113 11/09/2020 1127   HDL 49 11/09/2020 1127   CHOLHDL 2.6 11/09/2020 1127   CHOLHDL 3.1  11/21/2017 0814   VLDL 13 11/07/2016 0826   LDLCALC 56 11/09/2020 1127   LDLCALC 82 11/21/2017 0814      Wt Readings from Last 3 Encounters:  03/30/22 (!) 313 lb (142 kg)  03/24/22 (!) 312 lb 3.2 oz (141.6 kg)  02/16/22 (!) 313 lb 4 oz (142.1 kg)      ASSESSMENT AND PLAN:  1  PE   Pt with hx of massive PE  No predisposing  cause   He does say his father had a PE   Pt on chronic Xarelto   Will check labs today   2  CAD Denies angina   Follow   3  HL  Lipids today   Continue Crestor for now     4  HTN  BP is controlled   5   Venous insufficiency   Need to review procedure plans for Aug   Caution with taking off of NOAC  6    Obesity  Limit carbs   F/u next Spring     Signed, Dacie Mandel, MD  03/30/2022 1:13 PM    [address], [place], [place]  [address] Phone: [phone]; Fax: [phone]

## 2022-03-29 ENCOUNTER — Telehealth: Payer: Self-pay

## 2022-03-29 DIAGNOSIS — I744 Embolism and thrombosis of arteries of extremities, unspecified: Secondary | ICD-10-CM

## 2022-03-29 DIAGNOSIS — Z7901 Long term (current) use of anticoagulants: Secondary | ICD-10-CM

## 2022-03-29 DIAGNOSIS — R911 Solitary pulmonary nodule: Secondary | ICD-10-CM

## 2022-03-29 DIAGNOSIS — Z86711 Personal history of pulmonary embolism: Secondary | ICD-10-CM

## 2022-03-29 NOTE — Telephone Encounter (Signed)
   Name: [name]  DOB: Jul 14, 1946  MRN: [id number]  Primary Cardiologist: Dr. Harrington Challenger  Chart reviewed as part of pre-operative protocol coverage. Because of [name] past medical history and time since last visit, he will require a follow-up in-office visit in order to better assess preoperative cardiovascular risk.  Pre-op covering staff: - Please schedule appointment and call patient to inform them. If patient already had an upcoming appointment within acceptable timeframe, please add "pre-op clearance" to the appointment notes so provider is aware. - Please contact requesting surgeon's office via preferred method (i.e, phone, fax) to inform them of need for appointment prior to surgery.  Tami Lin Ryver Poblete, PA  03/29/2022, 5:28 PM

## 2022-03-29 NOTE — Telephone Encounter (Signed)
   Pre-operative Risk Assessment    Patient Name: [name]  DOB: 1946/10/05 MRN: [id number]      Request for Surgical Clearance    Procedure:   Endovenous Laser Ablation (L) Greater Saphenous vein   Date of Surgery:  Clearance 06/02/22                                 Surgeon:  Servando Snare MD Surgeon's Group or Practice Name:  [facility] Phone number:  [phone] [place]   Fax number:  [phone]   Type of Clearance Requested:   - Medical  - Pharmacy:  Hold Rivaroxaban (Xarelto) 2 days prior to procedure, day of procedure, and 2 days following procedure   Type of Anesthesia:  Local /Tumescent    Additional requests/questions:    Signed, Audry Kauzlarich   03/29/2022, 3:56 PM

## 2022-03-29 NOTE — Telephone Encounter (Signed)
Pt has appt with Dr. Harrington Challenger 03/30/22

## 2022-03-30 ENCOUNTER — Encounter: Payer: Self-pay | Admitting: Internal Medicine

## 2022-03-30 ENCOUNTER — Ambulatory Visit (INDEPENDENT_AMBULATORY_CARE_PROVIDER_SITE_OTHER): Payer: Medicare Other | Admitting: Internal Medicine

## 2022-03-30 VITALS — BP 118/72 | HR 59 | Ht 74.0 in | Wt 313.0 lb

## 2022-03-30 DIAGNOSIS — I10 Essential (primary) hypertension: Secondary | ICD-10-CM

## 2022-03-30 DIAGNOSIS — Z79899 Other long term (current) drug therapy: Secondary | ICD-10-CM

## 2022-03-30 DIAGNOSIS — I2510 Atherosclerotic heart disease of native coronary artery without angina pectoris: Secondary | ICD-10-CM | POA: Diagnosis not present

## 2022-03-30 DIAGNOSIS — E785 Hyperlipidemia, unspecified: Secondary | ICD-10-CM | POA: Diagnosis not present

## 2022-03-30 LAB — COMPREHENSIVE METABOLIC PANEL
ALT: 20 IU/L (ref 0–44)
AST: 15 IU/L (ref 0–40)
Albumin/Globulin Ratio: 1.8 (ref 1.2–2.2)
Albumin: 4.2 g/dL (ref 3.7–4.7)
Alkaline Phosphatase: 71 IU/L (ref 44–121)
BUN/Creatinine Ratio: 24 (ref 10–24)
BUN: 22 mg/dL (ref 8–27)
Bilirubin Total: 0.2 mg/dL (ref 0.0–1.2)
CO2: 26 mmol/L (ref 20–29)
Calcium: 10 mg/dL (ref 8.6–10.2)
Chloride: 103 mmol/L (ref 96–106)
Creatinine, Ser: 0.91 mg/dL (ref 0.76–1.27)
Globulin, Total: 2.3 g/dL (ref 1.5–4.5)
Glucose: 131 mg/dL — ABNORMAL HIGH (ref 70–99)
Potassium: 4.6 mmol/L (ref 3.5–5.2)
Sodium: 140 mmol/L (ref 134–144)
Total Protein: 6.5 g/dL (ref 6.0–8.5)
eGFR: 88 mL/min/{1.73_m2} (ref >59)

## 2022-03-30 LAB — HEMOGLOBIN A1C
Est. average glucose Bld gHb Est-mCnc: 151 mg/dL
Hgb A1c MFr Bld: 6.9 % — ABNORMAL HIGH (ref 4.8–5.6)

## 2022-03-30 LAB — CBC
Hematocrit: 46.1 % (ref 37.5–51.0)
Hemoglobin: 15.1 g/dL (ref 13.0–17.7)
MCH: 28.7 pg (ref 26.6–33.0)
MCHC: 32.8 g/dL (ref 31.5–35.7)
MCV: 88 fL (ref 79–97)
Platelets: 232 10*3/uL (ref 150–450)
RBC: 5.26 x10E6/uL (ref 4.14–5.80)
RDW: 13.9 % (ref 11.6–15.4)
WBC: 7.2 10*3/uL (ref 3.4–10.8)

## 2022-03-30 LAB — LIPID PANEL
Chol/HDL Ratio: 2.9 ratio (ref 0.0–5.0)
Cholesterol, Total: 137 mg/dL (ref 100–199)
HDL: 48 mg/dL (ref >39)
LDL Chol Calc (NIH): 68 mg/dL (ref 0–99)
Triglycerides: 115 mg/dL (ref 0–149)
VLDL Cholesterol Cal: 21 mg/dL (ref 5–40)

## 2022-03-30 LAB — TSH: TSH: 2.18 u[IU]/mL (ref 0.450–4.500)

## 2022-03-30 NOTE — Patient Instructions (Signed)
Medication Instructions:   Your physician recommends that you continue on your current medications as directed. Please refer to the Current Medication list given to you today.  *If you need a refill on your cardiac medications before your next appointment, please call your pharmacy*   Lab Work: CMET LIPIDS CBC TSH AND HGA1C  If you have labs (blood work) drawn today and your tests are completely normal, you will receive your results only by: [place] (if you have MyChart) OR A paper copy in the mail If you have any lab test that is abnormal or we need to change your treatment, we will call you to review the results.   Testing/Procedures: NONE ORDERED  TODAY     Follow-Up: At [facility], you and your health needs are our priority.  As part of our continuing mission to provide you with exceptional heart care, we have created designated Provider Care Teams.  These Care Teams include your primary Cardiologist (physician) and Advanced Practice Providers (APPs -  Physician Assistants and Nurse Practitioners) who all work together to provide you with the care you need, when you need it.  We recommend signing up for the patient portal called "MyChart".  Sign up information is provided on this After Visit Summary.  MyChart is used to connect with patients for Virtual Visits (Telemedicine).  Patients are able to view lab/test results, encounter notes, upcoming appointments, etc.  Non-urgent messages can be sent to your provider as well.   To learn more about what you can do with MyChart, go to [email or web address].    Your next appointment:   9 month(s)  The format for your next appointment:   In Person  Provider:   Dr. Dorris Carnes      Important Information About Sugar

## 2022-04-04 ENCOUNTER — Other Ambulatory Visit: Payer: Self-pay | Admitting: *Deleted

## 2022-04-04 DIAGNOSIS — I83812 Varicose veins of left lower extremities with pain: Secondary | ICD-10-CM

## 2022-04-05 NOTE — Telephone Encounter (Signed)
[name], Dr. Darcella Cheshire nurse, calling to follow up on clearance.

## 2022-04-05 NOTE — Telephone Encounter (Signed)
Covering preop today. Will route to caller via Epic to let her know we are waiting on clarification from MD about surgical clearance. Patient seen in office 03/30/20; do not specifically see clearance mention in visit so appreciate Dr. Charlott Rakes input - pharmD also routed question of anticoagulation duration to Dr. Tenny Craw. Will re-route today to ensure both questions are received. Preop team will finalize recs once MD has relayed their recommendation.

## 2022-04-06 ENCOUNTER — Encounter (HOSPITAL_BASED_OUTPATIENT_CLINIC_OR_DEPARTMENT_OTHER): Payer: Medicare Other | Admitting: General Surgery

## 2022-04-06 DIAGNOSIS — L97211 Non-pressure chronic ulcer of right calf limited to breakdown of skin: Secondary | ICD-10-CM | POA: Diagnosis not present

## 2022-04-06 DIAGNOSIS — I10 Essential (primary) hypertension: Secondary | ICD-10-CM | POA: Diagnosis not present

## 2022-04-06 DIAGNOSIS — I2510 Atherosclerotic heart disease of native coronary artery without angina pectoris: Secondary | ICD-10-CM | POA: Diagnosis not present

## 2022-04-06 DIAGNOSIS — I87303 Chronic venous hypertension (idiopathic) without complications of bilateral lower extremity: Secondary | ICD-10-CM | POA: Diagnosis not present

## 2022-04-06 DIAGNOSIS — L97812 Non-pressure chronic ulcer of other part of right lower leg with fat layer exposed: Secondary | ICD-10-CM | POA: Diagnosis not present

## 2022-04-07 NOTE — Telephone Encounter (Signed)
I saw the pt recently WIth hx massive PE in past, I'm concerned about how long would be off of anticoagulation at time of surgery in August.     What are the  plans for resuming after?

## 2022-04-08 DIAGNOSIS — I10 Essential (primary) hypertension: Secondary | ICD-10-CM

## 2022-04-08 DIAGNOSIS — E785 Hyperlipidemia, unspecified: Secondary | ICD-10-CM

## 2022-04-08 NOTE — Telephone Encounter (Signed)
Patient with FHx of clotting   He had a saddle pulmonary embolus  WOuld recomm hypercoagulable blood panel

## 2022-04-13 ENCOUNTER — Other Ambulatory Visit: Payer: Self-pay | Admitting: Nurse Practitioner

## 2022-04-13 ENCOUNTER — Encounter (HOSPITAL_BASED_OUTPATIENT_CLINIC_OR_DEPARTMENT_OTHER): Payer: Medicare Other | Admitting: General Surgery

## 2022-04-13 NOTE — Telephone Encounter (Signed)
Can you order hypercoagulable panel?

## 2022-04-13 NOTE — Telephone Encounter (Signed)
I left a message for the pt to call back to schedule lab appt. Per Dr. Harrington Challenger she has requested lab work to be done, see notes from Dr. Harrington Challenger. I have placed the lab order and have left a message for the pt to call back.   I will update the requesting office the pt needs lab work before the pt can be cleared.

## 2022-04-13 NOTE — Telephone Encounter (Signed)
There is a lab order for a comprehensive hypercoagulable panel that involves multiple tubes of blood   One order if put in under complrensive hypercoagulable panel.    He needs this done before surgery    Need to understand what caused his PE in past    Please have me be one ordering so that it comes back to me   The preop pool does not have to be involved   I will stay involved with this to determine plan

## 2022-04-13 NOTE — Telephone Encounter (Signed)
I will forward to these notes to pre op provider for further input.

## 2022-04-13 NOTE — Addendum Note (Signed)
Addended by: Michae Kava on: 04/13/2022 02:14 PM   Modules accepted: Orders

## 2022-04-15 ENCOUNTER — Encounter (HOSPITAL_BASED_OUTPATIENT_CLINIC_OR_DEPARTMENT_OTHER): Payer: Medicare Other | Attending: General Surgery | Admitting: General Surgery

## 2022-04-15 ENCOUNTER — Other Ambulatory Visit: Payer: Medicare Other | Admitting: *Deleted

## 2022-04-15 DIAGNOSIS — R911 Solitary pulmonary nodule: Secondary | ICD-10-CM | POA: Diagnosis not present

## 2022-04-15 DIAGNOSIS — L97811 Non-pressure chronic ulcer of other part of right lower leg limited to breakdown of skin: Secondary | ICD-10-CM | POA: Diagnosis not present

## 2022-04-15 DIAGNOSIS — I87303 Chronic venous hypertension (idiopathic) without complications of bilateral lower extremity: Secondary | ICD-10-CM | POA: Diagnosis not present

## 2022-04-15 DIAGNOSIS — I2510 Atherosclerotic heart disease of native coronary artery without angina pectoris: Secondary | ICD-10-CM | POA: Insufficient documentation

## 2022-04-15 DIAGNOSIS — I10 Essential (primary) hypertension: Secondary | ICD-10-CM | POA: Insufficient documentation

## 2022-04-15 DIAGNOSIS — L97211 Non-pressure chronic ulcer of right calf limited to breakdown of skin: Secondary | ICD-10-CM | POA: Diagnosis not present

## 2022-04-15 DIAGNOSIS — I744 Embolism and thrombosis of arteries of extremities, unspecified: Secondary | ICD-10-CM

## 2022-04-15 DIAGNOSIS — Z87891 Personal history of nicotine dependence: Secondary | ICD-10-CM | POA: Diagnosis not present

## 2022-04-15 DIAGNOSIS — I87311 Chronic venous hypertension (idiopathic) with ulcer of right lower extremity: Secondary | ICD-10-CM | POA: Diagnosis not present

## 2022-04-15 DIAGNOSIS — Z86711 Personal history of pulmonary embolism: Secondary | ICD-10-CM | POA: Diagnosis not present

## 2022-04-15 NOTE — Progress Notes (Signed)
[name], [name] ([id number]) Visit Report for 04/15/2022 Chief Complaint Document Details Patient Name: Date of Service: WORTHINGTON, CRUZAN RLES 04/15/2022 12:30 PM Medical Record Number: [id number] Patient Account Number: [id number] Date of Birth/Sex: Treating RN: 1946/10/05 (76 y.o. [name] Primary Care Provider: Agustina Caroli Other Clinician: Referring Provider: Treating Provider/Extender: Irineo Axon Weeks in Treatment: 13 Information Obtained from: Patient Chief Complaint Right lower extremity wound Electronic Signature(s) Signed: 04/15/2022 12:50:37 PM By: Fredirick Maudlin MD FACS Entered By: Fredirick Maudlin on 04/15/2022 12:50:36 -------------------------------------------------------------------------------- HPI Details Patient Name: Date of Service: [name], [name] RLES 04/15/2022 12:30 PM Medical Record Number: [id number] Patient Account Number: [id number] Date of Birth/Sex: Treating RN: 11-05-1945 (76 y.o. [name] Primary Care Provider: Agustina Caroli Other Clinician: Referring Provider: Treating Provider/Extender: Irineo Axon Weeks in Treatment: 13 History of Present Illness HPI Description: Admission 08/23/2021 Mr. Torri Langston is a 76 year old male with a past medical history of essential hypertension, coronary artery disease and hyperlipidemia that presents to the clinic for a wound to his left lower extremity. He states that 2 months ago he scratched the back of his leg creating a small wound that has not healed. He states it drains serous fluid constantly. He has been using Neosporin to the area and keeping it covered. He recently saw his primary care physician and was recommended compression stockings and an antibiotic. Patient reports improvement in redness with the use of Duricef. He has been wearing his compression stockings for the past week. He is also on a diuretic for his hypertension. He denies signs of  infection. 4/21; patient tolerated the compression wrap well. He has no issues or complaints today. READMISSION 01/10/2022 Mr. Pile is back with a new venous ulcer on the right calf. He healed fairly quickly from his last left sided wound and says that he has been wearing compression stockings, albeit intermittently. He thinks they are 15 to 20 mmHg pressure. Since his last wound care visit, the left-sided wound reopened, but he was able to get it closed on his own just using topical Neosporin. He attempted to do the same on the right, but it simply has persisted and his leg is fairly swollen and erythematous today. The wounds themselves are scattered and weeping serous fluid, consistent with venous stasis ulcers. 01/17/2022: The erythema and swelling on the right are improved this week after a course of Augmentin. He has developed new blisters on his posterior left leg that he says came up when he started wearing compression stockings, but these are only 15 to 20 mmHg pressure. There is some accumulated eschar on his wounds, but no significant slough. We have been using silver alginate and 3 layer compression. 01/24/2022: His wounds are all healed, but his juxta lite stockings did not arrive. 02/02/2022: His juxta lite stockings arrived but he only had 1 for the right leg; he did not have a second for his left. He [place] 1 from [place] and was wearing them bilaterally. Apparently the one from [place] is the one he used on his right leg and he subsequently developed an area of irritation, potentially from friction and has reopened new blister/ulcer lesions on his right lower extremity. They are clean but his skin is weeping. 02/08/2022: His right-sided wounds are nearly healed. They are very small and superficial and very clean. No weeping or drainage from the leg. 02/15/2022: His wounds are healed. 02/22/2022: Despite wearing compressive garments, he had a wound reopened on his right posterior calf. He says  that it started out as blisters and then subsequently opened, similar to his prior events. It is in a slightly different location. He has never had venous reflux studies. The wounds are draining serous fluid. No concern for infection. 03/01/2022: He went for venous reflux studies and was found to have venous reflux in the right short saphenous vein, as well as extensive venous reflux throughout all of the superficial veins in his left lower extremity. The ulcer itself is smaller, but he is frustrated at the persistence of the problem. 03/08/2022: His wound is closed. He has still not heard anything from [facility] regarding an appointment with a surgeon. 03/10/2022: Unfortunately, Tuesday night after he got home, he developed new blisters on his posterior right calf. They are in a perfectly square orientation that he and his wife say corresponds to where the the Zetuvit pad had been placed when his leg was being wrapped. He also describes fairly intense pruritus in the site. There is a square area on his posterior right calf that is red and irritated-looking, suggesting a contact dermatitis reaction to this dressing. He also reports that he used to have some lymphedema pumps but he feels like they are old and not particularly effective and he is interested in obtaining a new set. 03/18/2022: We did not use a Zetuvit pad last week and as a result, his leg has healed completely. The company providing lymphedema pumps has been to his house and he will be getting a new set around the middle of July. 04/06/2022: Despite wearing compression stockings, the right leg has had new wounds open. They started as blisters on Sunday and subsequently they opened and began to drain serous fluid. They are limited to breakdown of skin. 04/15/2022: His wounds are healed again. Electronic Signature(s) Signed: 04/15/2022 12:50:57 PM By: Fredirick Maudlin MD FACS Entered By: Fredirick Maudlin on 04/15/2022  12:50:57 -------------------------------------------------------------------------------- Physical Exam Details Patient Name: Date of Service: [name], [name] RLES 04/15/2022 12:30 PM Medical Record Number: [id number] Patient Account Number: [id number] Date of Birth/Sex: Treating RN: Feb 13, 1946 (76 y.o. [name] Primary Care Provider: Agustina Caroli Other Clinician: Referring Provider: Treating Provider/Extender: Irineo Axon Weeks in Treatment: 13 Constitutional . Slightly bradycardic, asymptomatic.. . . No acute distress.Marland Kitchen Respiratory Normal work of breathing on room air.. Notes 04/15/2022: His wounds are healed. Electronic Signature(s) Signed: 04/15/2022 12:51:42 PM By: Fredirick Maudlin MD FACS Entered By: Fredirick Maudlin on 04/15/2022 12:51:42 -------------------------------------------------------------------------------- Physician Orders Details Patient Name: Date of Service: MIKLE, STERNBERG RLES 04/15/2022 12:30 PM Medical Record Number: [id number] Patient Account Number: [id number] Date of Birth/Sex: Treating RN: October 29, 1945 (76 y.o. 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 Bathing/ Shower/ Hygiene Other Bathing/Shower/Hygiene Orders/Instructions: - After compression wraps are off, use lotion-Aquaphor;Sween;Eucerin etc. Off-Loading Other: - Elevate legs  throughout the day Electronic Signature(s) Signed: 04/15/2022 12:51:50 PM By: Fredirick Maudlin MD FACS Entered By: Fredirick Maudlin on 04/15/2022 12:51:50 -------------------------------------------------------------------------------- Problem List Details Patient Name: Date of Service: SHAHZAD, THOMANN RLES 04/15/2022 12:30 PM Medical Record Number: [id number] Patient Account Number: [id number] Date of Birth/Sex: Treating RN:  09-Sep-1946 (76 y.o. [name] Primary Care Provider: Agustina Caroli Other Clinician: Referring Provider: Treating Provider/Extender: Maximino Sarin in Treatment: 13 Active Problems ICD-10 Encounter Code Description Active Date MDM Diagnosis I10 Essential (primary) hypertension 01/10/2022 No Yes I25.10 Atherosclerotic heart disease of native coronary artery without angina pectoris 01/10/2022 No Yes I87.303 Chronic venous hypertension (idiopathic) without complications of bilateral 01/10/2022 No Yes lower extremity L97.211 Non-pressure chronic ulcer of right calf limited to breakdown of skin 01/10/2022 No Yes Inactive Problems Resolved Problems Electronic Signature(s) Signed: 04/15/2022 12:49:38 PM By: Fredirick Maudlin MD FACS Entered By: Fredirick Maudlin on 04/15/2022 12:49:38 -------------------------------------------------------------------------------- Progress Note Details Patient Name: Date of Service: MURTAZA, SHELL RLES 04/15/2022 12:30 PM Medical Record Number: [id number] Patient Account Number: [id number] Date of Birth/Sex: Treating RN: 02-10-1946 (76 y.o. [name] Primary Care Provider: Agustina Caroli Other Clinician: Referring Provider: Treating Provider/Extender: Irineo Axon Weeks in Treatment: 13 Subjective Chief Complaint Information obtained from Patient Right lower extremity wound History of Present Illness (HPI) Admission 08/23/2021 Mr. Precious Gilchrest is a 76 year old male with a past  medical history of essential hypertension, coronary artery disease and hyperlipidemia that presents to the clinic for a wound to his left lower extremity. He states that 2 months ago he scratched the back of his leg creating a small wound that has not healed. He states it drains serous fluid constantly. He has been using Neosporin to the area and keeping it covered. He recently saw his primary care physician and was recommended compression stockings and an antibiotic. Patient reports improvement in redness with the use of Duricef. He has been wearing his compression stockings for the past week. He is also on a diuretic for his hypertension. He denies signs of infection. 4/21; patient tolerated the compression wrap well. He has no issues or complaints today. READMISSION 01/10/2022 Mr. Dansby is back with a new venous ulcer on the right calf. He healed fairly quickly from his last left sided wound and says that he has been wearing compression stockings, albeit intermittently. He thinks they are 15 to 20 mmHg pressure. Since his last wound care visit, the left-sided wound reopened, but he was able to get it closed on his own just using topical Neosporin. He attempted to do the same on the right, but it simply has persisted and his leg is fairly swollen and erythematous today. The wounds themselves are scattered and weeping serous fluid, consistent with venous stasis ulcers. 01/17/2022: The erythema and swelling on the right are improved this week after a course of Augmentin. He has developed new blisters on his posterior left leg that he says came up when he started wearing compression stockings, but these are only 15 to 20 mmHg pressure. There is some accumulated eschar on his wounds, but no significant slough. We have been using silver alginate and 3 layer compression. 01/24/2022: His wounds are all healed, but his juxta lite stockings did not arrive. 02/02/2022: His juxta lite stockings arrived but he  only had 1 for the right leg; he did not have a second for his left. He ordered 1 from [place] and was wearing them bilaterally. Apparently the one from [place] is the one he used on his right leg and he subsequently developed an area of irritation, potentially from friction and has reopened new blister/ulcer lesions on his right lower extremity. They are clean but his skin is weeping. 02/08/2022: His right-sided wounds are nearly healed. They are very small and superficial and very  clean. No weeping or drainage from the leg. 02/15/2022: His wounds are healed. 02/22/2022: Despite wearing compressive garments, he had a wound reopened on his right posterior calf. He says that it started out as blisters and then subsequently opened, similar to his prior events. It is in a slightly different location. He has never had venous reflux studies. The wounds are draining serous fluid. No concern for infection. 03/01/2022: He went for venous reflux studies and was found to have venous reflux in the right short saphenous vein, as well as extensive venous reflux throughout all of the superficial veins in his left lower extremity. The ulcer itself is smaller, but he is frustrated at the persistence of the problem. 03/08/2022: His wound is closed. He has still not heard anything from [facility] regarding an appointment with a surgeon. 03/10/2022: Unfortunately, Tuesday night after he got home, he developed new blisters on his posterior right calf. They are in a perfectly square orientation that he and his wife say corresponds to where the the Zetuvit pad had been placed when his leg was being wrapped. He also describes fairly intense pruritus in the site. There is a square area on his posterior right calf that is red and irritated-looking, suggesting a contact dermatitis reaction to this dressing. He also reports that he used to have some lymphedema pumps but he feels like they are old and not particularly effective and he is  interested in obtaining a new set. 03/18/2022: We did not use a Zetuvit pad last week and as a result, his leg has healed completely. The company providing lymphedema pumps has been to his house and he will be getting a new set around the middle of July. 04/06/2022: Despite wearing compression stockings, the right leg has had new wounds open. They started as blisters on Sunday and subsequently they opened and began to drain serous fluid. They are limited to breakdown of skin. 04/15/2022: His wounds are healed again. Patient History Information obtained from Patient. Family History Unknown History. Social History Former smoker - quit 50 years ago, Marital Status - Married, Alcohol Use - Rarely, Drug Use - No History, Caffeine Use - Rarely. Medical History Eyes Denies history of Cataracts, Glaucoma, Optic Neuritis Ear/Nose/Mouth/Throat Denies history of Chronic sinus problems/congestion, Middle ear problems Hematologic/Lymphatic Denies history of Anemia, Hemophilia, Human Immunodeficiency Virus, Lymphedema, Sickle Cell Disease Respiratory Denies history of Aspiration, Asthma, Chronic Obstructive Pulmonary Disease (COPD), Pneumothorax, Sleep Apnea, Tuberculosis Cardiovascular Patient has history of Coronary Artery Disease - stents x 3, Hypertension Gastrointestinal Denies history of Cirrhosis , Colitis, Crohnoos, Hepatitis A, Hepatitis B, Hepatitis C Endocrine Denies history of Type I Diabetes, Type II Diabetes Genitourinary Denies history of End Stage Renal Disease Immunological Denies history of Lupus Erythematosus, Raynaudoos, Scleroderma Integumentary (Skin) Denies history of History of Burn Musculoskeletal Patient has history of Osteoarthritis Neurologic Denies history of Dementia, Neuropathy, Quadriplegia, Paraplegia, Seizure Disorder Oncologic Denies history of Received Chemotherapy, Received Radiation Psychiatric Denies history of Anorexia/bulimia, Confinement  Anxiety Medical A Surgical History Notes nd Ear/Nose/Mouth/Throat hard of hearing Respiratory Hx Pulmonary Embolism Cardiovascular Hypertension Oncologic Melanoma on back 2018 Objective Constitutional Slightly bradycardic, asymptomatic.Marland Kitchen No acute distress.. Vitals Time Taken: 12:39 PM, Temperature: 98.3 F, Pulse: 58 bpm, Respiratory Rate: 16 breaths/min, Blood Pressure: 124/76 mmHg. Respiratory Normal work of breathing on room air.. General Notes: 04/15/2022: His wounds are healed. Integumentary (Hair, Skin) Wound #9 status is Open. Original cause of wound was Gradually Appeared. The date acquired was: 04/05/2022. The wound has been in treatment  1 weeks. The wound is located on the Right,Medial Lower Leg. The wound measures 0cm length x 0cm width x 0cm depth; 0cm^2 area and 0cm^3 volume. There is no tunneling or undermining noted. There is a none present amount of drainage noted. The wound margin is distinct with the outline attached to the wound base. There is no granulation within the wound bed. There is no necrotic tissue within the wound bed. 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 Bathing/ Shower/ Hygiene: Other Bathing/Shower/Hygiene Orders/Instructions: - After compression wraps are off, use lotion-Aquaphor;Sween;Eucerin etc. Off-Loading: Other: - Elevate legs throughout the day 04/15/2022: His wounds are healed again. He will continue to wear compression stockings and elevate his legs. Follow-up as needed. Electronic Signature(s) Signed: 04/15/2022 12:52:11 PM By: Fredirick Maudlin MD FACS Entered By: Fredirick Maudlin on  04/15/2022 12:52:11 -------------------------------------------------------------------------------- HxROS Details Patient Name: Date of Service: [name], [name] RLES 04/15/2022 12:30 PM Medical Record Number: [id number] Patient Account Number: [id number] Date of Birth/Sex: Treating RN: 04-Jun-1946 (76 y.o. [name] Primary Care Provider: Agustina Caroli Other Clinician: Referring Provider: Treating Provider/Extender: Maximino Sarin in Treatment: 13 Information Obtained From Patient Eyes Medical History: Negative for: Cataracts; Glaucoma; Optic Neuritis Ear/Nose/Mouth/Throat Medical History: Negative for: Chronic sinus problems/congestion; Middle ear problems Past Medical History Notes: hard of hearing Hematologic/Lymphatic Medical History: Negative for: Anemia; Hemophilia; Human Immunodeficiency Virus; Lymphedema; Sickle Cell Disease Respiratory Medical History: Negative for: Aspiration; Asthma; Chronic Obstructive Pulmonary Disease (COPD); Pneumothorax; Sleep Apnea; Tuberculosis Past Medical History Notes: Hx Pulmonary Embolism Cardiovascular Medical History: Positive for: Coronary Artery Disease - stents x 3; Hypertension Past Medical History Notes: Hypertension Gastrointestinal Medical History: Negative for: Cirrhosis ; Colitis; Crohns; Hepatitis A; Hepatitis B; Hepatitis C Endocrine Medical History: Negative for: Type I Diabetes; Type II Diabetes Genitourinary Medical History: Negative for: End Stage Renal Disease Immunological Medical History: Negative for: Lupus Erythematosus; Raynauds; Scleroderma Integumentary (Skin) Medical History: Negative for: History of Burn Musculoskeletal Medical History: Positive for: Osteoarthritis Neurologic Medical History: Negative for: Dementia; Neuropathy; Quadriplegia; Paraplegia; Seizure Disorder Oncologic Medical History: Negative for: Received Chemotherapy; Received Radiation Past  Medical History Notes: Melanoma on back 2018 Psychiatric Medical History: Negative for: Anorexia/bulimia; Confinement Anxiety Immunizations Pneumococcal Vaccine: Received Pneumococcal Vaccination: Yes Received Pneumococcal Vaccination On or After 60th Birthday: Yes Implantable Devices None Family and Social History Unknown History: Yes; Former smoker - quit 50 years ago; Marital Status - Married; Alcohol Use: Rarely; Drug Use: No History; Caffeine Use: Rarely; Financial Concerns: No; Food, Clothing or Shelter Needs: No; Support System Lacking: No; Transportation Concerns: No Engineer, maintenance) Signed: 04/15/2022 1:40:53 PM By: Fredirick Maudlin MD FACS Signed: 04/15/2022 5:56:39 PM By: Dellie Catholic RN Entered By: Fredirick Maudlin on 04/15/2022 12:51:09 -------------------------------------------------------------------------------- SuperBill Details Patient Name: Date of Service: [name], [name] RLES 04/15/2022 Medical Record Number: [id number] Patient Account Number: [id number] Date of Birth/Sex: Treating RN: 08-Dec-1945 (76 y.o. 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 L97.211 Non-pressure chronic ulcer of right calf limited to breakdown  of skin Facility Procedures CPT4 Code: [id number] Description: [facility] VISIT-LEV 3 EST PT Modifier: Quantity: 1 Physician Procedures : CPT4 Code Description Modifier [id number] - WC PHYS LEVEL 2 - EST PT ICD-10 Diagnosis Description M15.868 Non-pressure chronic ulcer of right calf limited to breakdown of skin I87.303 Chronic  venous hypertension (idiopathic) without complications of  bilateral lower extremity I10 Essential (primary) hypertension I25.10 Atherosclerotic heart disease of native coronary artery without angina pectoris Quantity: 1 Electronic Signature(s) Signed: 04/15/2022 1:40:53 PM By: Fredirick Maudlin MD FACS Signed: 04/15/2022 5:20:13 PM By: Adline Peals Previous Signature: 04/15/2022 12:52:25 PM Version By: Fredirick Maudlin MD FACS Entered By: Adline Peals on 04/15/2022 13:31:27

## 2022-04-15 NOTE — Addendum Note (Signed)
Addended by: Drue Novel I on: 04/15/2022 11:23 AM   Modules accepted: Orders

## 2022-04-15 NOTE — Progress Notes (Signed)
[name], [name] ([id number]) Visit Report for 04/15/2022 Arrival Information Details Patient Name: Date of Service: [name], [name] 04/15/2022 12:30 PM Medical Record Number: [id number] Patient Account Number: [id number] Date of Birth/Sex: Treating RN: 1946-04-19 (76 y.o. [name] Primary Care Jacqualyn Sedgwick: Agustina Caroli Other Clinician: Referring Santana Gosdin: Treating Tyshawn Keel/Extender: Maximino Sarin in Treatment: 13 Visit Information History Since Last Visit Added or deleted any medications: No Patient Arrived: Ambulatory Any new allergies or adverse reactions: No Arrival Time: 12:37 Had a fall or experienced change in No Accompanied By: wife activities of daily living that may affect Transfer Assistance: None risk of falls: Patient Identification Verified: Yes Signs or symptoms of abuse/neglect since last visito No Secondary Verification Process Completed: Yes Hospitalized since last visit: No Patient Requires Transmission-Based Precautions: No Implantable device outside of the clinic excluding No Patient Has Alerts: Yes cellular tissue based products placed in the center Patient Alerts: Patient on Blood Thinner since last visit: Xarelto Has Dressing in Place as Prescribed: Yes Has Compression in Place as Prescribed: Yes Pain Present Now: No Electronic Signature(s) Signed: 04/15/2022 5:20:13 PM By: Adline Peals Entered By: Adline Peals on 04/15/2022 12:38:57 -------------------------------------------------------------------------------- Clinic Level of Care Assessment Details Patient Name: Date of Service: [name], Bryan 04/15/2022 12:30 PM Medical Record Number: [id number] Patient Account Number: [id number] Date of Birth/Sex: Treating RN: 03/24/1946 (76 y.o. [name] Primary Care Marlisha Vanwyk: Agustina Caroli Other Clinician: Referring Deadra Diggins: Treating Woodrow Dulski/Extender: Maximino Sarin in  Treatment: 13 Clinic Level of Care Assessment Items TOOL 4 Quantity Score X- 1 0 Use when only an EandM is performed on FOLLOW-UP visit ASSESSMENTS - Nursing Assessment / Reassessment X- 1 10 Reassessment of Co-morbidities (includes updates in patient status) X- 1 5 Reassessment of Adherence to Treatment Plan ASSESSMENTS - Wound and Skin A ssessment / Reassessment X - Simple Wound Assessment / Reassessment - one wound 1 5 '[]'$  - 0 Complex Wound Assessment / Reassessment - multiple wounds '[]'$  - 0 Dermatologic / Skin Assessment (not related to wound area) ASSESSMENTS - Focused Assessment X- 1 5 Circumferential Edema Measurements - multi extremities '[]'$  - 0 Nutritional Assessment / Counseling / Intervention X- 1 5 Lower Extremity Assessment (monofilament, tuning fork, pulses) '[]'$  - 0 Peripheral Arterial Disease Assessment (using hand held doppler) ASSESSMENTS - Ostomy and/or Continence Assessment and Care '[]'$  - 0 Incontinence Assessment and Management '[]'$  - 0 Ostomy Care Assessment and Management (repouching, etc.) PROCESS - Coordination of Care X - Simple Patient / Family Education for ongoing care 1 15 '[]'$  - 0 Complex (extensive) Patient / Family Education for ongoing care X- 1 10 Staff obtains Programmer, systems, Records, T Results / Process Orders est '[]'$  - 0 Staff telephones HHA, Nursing Homes / Clarify orders / etc '[]'$  - 0 Routine Transfer to another Facility (non-emergent condition) '[]'$  - 0 Routine Hospital Admission (non-emergent condition) '[]'$  - 0 New Admissions / Biomedical engineer / Ordering NPWT Apligraf, etc. , '[]'$  - 0 Emergency Hospital Admission (emergent condition) X- 1 10 Simple Discharge Coordination '[]'$  - 0 Complex (extensive) Discharge Coordination PROCESS - Special Needs '[]'$  - 0 Pediatric / Minor Patient Management '[]'$  - 0 Isolation Patient Management '[]'$  - 0 Hearing / Language / Visual special needs '[]'$  - 0 Assessment of Community assistance (transportation,  D/C planning, etc.) '[]'$  - 0 Additional assistance / Altered mentation '[]'$  - 0 Support Surface(s) Assessment (bed, cushion, seat, etc.) INTERVENTIONS - Wound Cleansing / Measurement X - Simple Wound Cleansing - one wound 1 5 '[]'$  -  0 Complex Wound Cleansing - multiple wounds X- 1 5 Wound Imaging (photographs - any number of wounds) '[]'$  - 0 Wound Tracing (instead of photographs) '[]'$  - 0 Simple Wound Measurement - one wound '[]'$  - 0 Complex Wound Measurement - multiple wounds INTERVENTIONS - Wound Dressings '[]'$  - 0 Small Wound Dressing one or multiple wounds '[]'$  - 0 Medium Wound Dressing one or multiple wounds '[]'$  - 0 Large Wound Dressing one or multiple wounds '[]'$  - 0 Application of Medications - topical '[]'$  - 0 Application of Medications - injection INTERVENTIONS - Miscellaneous '[]'$  - 0 External ear exam '[]'$  - 0 Specimen Collection (cultures, biopsies, blood, body fluids, etc.) '[]'$  - 0 Specimen(s) / Culture(s) sent or taken to Lab for analysis '[]'$  - 0 Patient Transfer (multiple staff / Civil Service fast streamer / Similar devices) '[]'$  - 0 Simple Staple / Suture removal (25 or less) '[]'$  - 0 Complex Staple / Suture removal (26 or more) '[]'$  - 0 Hypo / Hyperglycemic Management (close monitor of Blood Glucose) '[]'$  - 0 Ankle / Brachial Index (ABI) - do not check if billed separately X- 1 5 Vital Signs Has the patient been seen at the hospital within the last three years: Yes Total Score: 80 Level Of Care: New/Established - Level 3 Electronic Signature(s) Signed: 04/15/2022 5:20:13 PM By: Adline Peals Entered By: Adline Peals on 04/15/2022 13:31:21 -------------------------------------------------------------------------------- Encounter Discharge Information Details Patient Name: Date of Service: [name], [name] 04/15/2022 12:30 PM Medical Record Number: [id number] Patient Account Number: [id number] Date of Birth/Sex: Treating RN: 05-Nov-1945 (76 y.o. [name] Primary Care  Sameria Morss: Agustina Caroli Other Clinician: Referring Curren Mohrmann: Treating Davan Hark/Extender: Maximino Sarin in Treatment: 13 Encounter Discharge Information Items Discharge Condition: Stable Ambulatory Status: Ambulatory Discharge Destination: Home Transportation: Private Auto Accompanied By: wife Schedule Follow-up Appointment: Yes Clinical Summary of Care: Patient Declined Electronic Signature(s) Signed: 04/15/2022 5:20:13 PM By: Adline Peals Entered By: Adline Peals on 04/15/2022 13:31:51 -------------------------------------------------------------------------------- Lower Extremity Assessment Details Patient Name: Date of Service: [name], [name] 04/15/2022 12:30 PM Medical Record Number: [id number] Patient Account Number: [id number] Date of Birth/Sex: Treating RN: 1946-01-05 (76 y.o. [name] Primary Care Alper Guilmette: Agustina Caroli Other Clinician: Referring Elyssia Strausser: Treating Lovel Suazo/Extender: Irineo Axon Weeks in Treatment: 13 Edema Assessment Assessed: [Left: No] [Right: No] Edema: [Left: Ye] [Right: s] Calf Left: Right: Point of Measurement: From Medial Instep 45.5 cm Ankle Left: Right: Point of Measurement: From Medial Instep 25.2 cm Vascular Assessment Pulses: Dorsalis Pedis Palpable: [Right:Yes] Electronic Signature(s) Signed: 04/15/2022 5:20:13 PM By: Adline Peals Entered By: Adline Peals on 04/15/2022 12:45:06 -------------------------------------------------------------------------------- Multi Wound Chart Details Patient Name: Date of Service: [name], [name] 04/15/2022 12:30 PM Medical Record Number: [id number] Patient Account Number: [id number] Date of Birth/Sex: Treating RN: Aug 24, 1946 (76 y.o. [name] Primary Care Carel Carrier: Agustina Caroli Other Clinician: Referring [name]: Treating Jeneva Schweizer/Extender: Irineo Axon Weeks in  Treatment: 13 Vital Signs Height(in): Pulse(bpm): 72 Weight(lbs): Blood Pressure(mmHg): 124/76 Body Mass Index(BMI): Temperature(F): 98.3 Respiratory Rate(breaths/min): 16 Photos: [N/A:N/A] Right, Medial Lower Leg N/A N/A Wound Location: Gradually Appeared N/A N/A Wounding Event: Venous Leg Ulcer N/A N/A Primary Etiology: Coronary Artery Disease, N/A N/A Comorbid History: Hypertension, Osteoarthritis 04/05/2022 N/A N/A Date Acquired: 1 N/A N/A Weeks of Treatment: Open N/A N/A Wound Status: No N/A N/A Wound Recurrence: 0x0x0 N/A N/A Measurements L x W x D (cm) 0 N/A N/A A (cm) : rea 0 N/A N/A Volume (cm) : 100.00% N/A N/A % Reduction in Area:  100.00% N/A N/A % Reduction in Volume: Full Thickness Without Exposed N/A N/A Classification: Support Structures None Present N/A N/A Exudate Amount: Distinct, outline attached N/A N/A Wound Margin: None Present (0%) N/A N/A Granulation Amount: None Present (0%) N/A N/A Necrotic Amount: Fascia: No N/A N/A Exposed Structures: Fat Layer (Subcutaneous Tissue): No Tendon: No Muscle: No Joint: No Bone: No Small (1-33%) N/A N/A Epithelialization: Treatment Notes Electronic Signature(s) Signed: 04/15/2022 12:50:31 PM By: Fredirick Maudlin MD FACS Signed: 04/15/2022 5:56:39 PM By: Dellie Catholic RN Entered By: Fredirick Maudlin on 04/15/2022 12:50:30 -------------------------------------------------------------------------------- Multi-Disciplinary Care Plan Details Patient Name: Date of Service: [name], [name] 04/15/2022 12:30 PM Medical Record Number: [id number] Patient Account Number: [id number] Date of Birth/Sex: Treating RN: 06-05-1946 (76 y.o. [name] Primary Care Fermin Yan: Agustina Caroli Other Clinician: Referring Dylanie Quesenberry: Treating Raymonda Pell/Extender: Irineo Axon Weeks in Treatment: [place] reviewed with physician Active Inactive Electronic  Signature(s) Signed: 04/15/2022 5:20:13 PM By: Sabas Sous By: Adline Peals on 04/15/2022 12:48:27 -------------------------------------------------------------------------------- Pain Assessment Details Patient Name: Date of Service: [name], [name] 04/15/2022 12:30 PM Medical Record Number: [id number] Patient Account Number: [id number] Date of Birth/Sex: Treating RN: May 18, 1946 (76 y.o. [name] Primary Care Allysson Rinehimer: Agustina Caroli Other Clinician: Referring Aayana Reinertsen: Treating Azam Gervasi/Extender: Irineo Axon Weeks in Treatment: 13 Active Problems Location of Pain Severity and Description of Pain Patient Has Paino No Site Locations Rate the pain. Current Pain Level: 0 Pain Management and Medication Current Pain Management: Electronic Signature(s) Signed: 04/15/2022 5:20:13 PM By: Adline Peals Entered By: Adline Peals on 04/15/2022 12:39:38 -------------------------------------------------------------------------------- Patient/Caregiver Education Details Patient Name: Date of Service: [name], [name] 7/7/2023andnbsp12:30 PM Medical Record Number: [id number] Patient Account Number: [id number] Date of Birth/Gender: Treating RN: January 19, 1946 (76 y.o. [name] Primary Care Physician: Agustina Caroli Other Clinician: Referring Physician: Treating Physician/Extender: Maximino Sarin in Treatment: 13 Education Assessment Education Provided To: Patient Education Topics Provided Wound/Skin Impairment: Methods: Explain/Verbal Responses: Reinforcements needed, State content correctly Electronic Signature(s) Signed: 04/15/2022 5:20:13 PM By: Adline Peals Entered By: Adline Peals on 04/15/2022 12:48:47 -------------------------------------------------------------------------------- Wound Assessment Details Patient Name: Date of Service: [name], [name] 04/15/2022  12:30 PM Medical Record Number: [id number] Patient Account Number: [id number] Date of Birth/Sex: Treating RN: 12-14-1945 (76 y.o. [name] Primary Care Anu Stagner: Agustina Caroli Other Clinician: Referring Melford Tullier: Treating Alexee Delsanto/Extender: Irineo Axon Weeks in Treatment: 13 Wound Status Wound Number: 9 Primary Etiology: Venous Leg Ulcer Wound Location: Right, Medial Lower Leg Wound Status: Open Wounding Event: Gradually Appeared Comorbid History: Coronary Artery Disease, Hypertension, Osteoarthritis Date Acquired: 04/05/2022 Weeks Of Treatment: 1 Clustered Wound: No Photos Wound Measurements Length: (cm) Width: (cm) Depth: (cm) Area: (cm) Volume: (cm) 0 % Reduction in Area: 100% 0 % Reduction in Volume: 100% 0 Epithelialization: Small (1-33%) 0 Tunneling: No 0 Undermining: No Wound Description Classification: Full Thickness Without Exposed Support Structures Wound Margin: Distinct, outline attached Exudate Amount: None Present Foul Odor After Cleansing: No Slough/Fibrino No Wound Bed Granulation Amount: None Present (0%) Exposed Structure Necrotic Amount: None Present (0%) Fascia Exposed: No Fat Layer (Subcutaneous Tissue) Exposed: No Tendon Exposed: No Muscle Exposed: No Joint Exposed: No Bone Exposed: No Electronic Signature(s) Signed: 04/15/2022 5:20:13 PM By: Adline Peals Entered By: Adline Peals on 04/15/2022 12:46:13 -------------------------------------------------------------------------------- [place] Details Patient Name: Date of Service: [name], ARVIDSON Rich 04/15/2022 12:30 PM Medical Record Number: [id number] Patient Account Number: [id number] Date of Birth/Sex: Treating RN: 08/24/1946 (76 y.o. [name] Primary Care  Lova Urbieta: Agustina Caroli Other Clinician: Referring Delaine Hernandez: Treating Pacey Willadsen/Extender: Maximino Sarin in Treatment: 13 Vital Signs Time Taken:  12:39 Temperature (F): 98.3 Pulse (bpm): 58 Respiratory Rate (breaths/min): 16 Blood Pressure (mmHg): 124/76 Reference Range: 80 - 120 mg / dl Electronic Signature(s) Signed: 04/15/2022 5:20:13 PM By: Adline Peals Entered By: Adline Peals on 04/15/2022 12:39:24

## 2022-04-18 ENCOUNTER — Ambulatory Visit (INDEPENDENT_AMBULATORY_CARE_PROVIDER_SITE_OTHER): Payer: Medicare Other | Admitting: *Deleted

## 2022-04-18 DIAGNOSIS — E785 Hyperlipidemia, unspecified: Secondary | ICD-10-CM

## 2022-04-18 DIAGNOSIS — I10 Essential (primary) hypertension: Secondary | ICD-10-CM

## 2022-04-18 NOTE — Chronic Care Management (AMB) (Signed)
Chronic Care Management   CCM RN Visit Note  04/18/2022 Name: [name] MRN: [id number] DOB: 21-Apr-1946  Subjective: [name] is a 76 y.o. year old male who is a primary care patient of Mitchel Honour, Ines Bloomer, MD. The care management team was consulted for assistance with disease management and care coordination needs.    Engaged with patient by telephone for follow up visit/ CCM RN CM case closure in response to provider referral for case management and/or care coordination services.   Consent to Services:  The patient was given information about Chronic Care Management services, agreed to services, and gave verbal consent prior to initiation of services.  Please see initial visit note for detailed documentation.   Patient agreed to services and verbal consent obtained.   Assessment: Review of patient past medical history, allergies, medications, health status, including review of consultants reports, laboratory and other test data, was performed as part of comprehensive evaluation and provision of chronic care management services.  CCM Care Plan No Known Allergies  Outpatient Encounter Medications as of 04/18/2022  Medication Sig Note   acetaminophen (TYLENOL) 325 MG tablet Take 2 tablets (650 mg total) by mouth every 6 (six) hours as needed for mild pain (or Fever >/= 101).    ALPRAZolam (XANAX) 0.5 MG tablet Take 1 tablet (0.5 mg total) by mouth daily as needed for anxiety.    Docusate Sodium (COLACE PO) Take by mouth daily.    furosemide (LASIX) 40 MG tablet TAKE 1 TABLET (40 MG TOTAL) BY MOUTH DAILY. TAKE 1/2 TAB FOR 5 DAYS BEFORE TAKING WHOLE TAB.    lisinopril (ZESTRIL) 10 MG tablet Take 1 tablet (10 mg total) by mouth daily.    pantoprazole (PROTONIX) 40 MG tablet Take 1 tablet (40 mg total) by mouth daily.    rivaroxaban (XARELTO) 20 MG TABS tablet Take 1 tablet (20 mg total) by mouth daily with supper.    rosuvastatin (CRESTOR) 20 MG tablet Take 1 tablet (20 mg total)  by mouth daily.    Sennosides (SENOKOT PO) Take by mouth daily. 10/28/2020: Senokot D   No facility-administered encounter medications on file as of 04/18/2022.   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   Generalized anxiety disorder 08/31/2016   History of colonic polyps 08/31/2016   Conditions to be addressed/monitored:  HTN and HLD  Care Plan : RN Care Manager Plan of Care  Updates made by Knox Royalty, RN since 04/18/2022 12:00 AM     Problem: Chronic Disease Management Needs   Priority: High     Long-Range Goal: Ongoing adherence to established plan of care for long term chronic disease management   Start Date: 10/08/2021  Expected End Date: 10/08/2022  Priority: High  Note:   Current Barriers:  Chronic Disease Management support and education needs related to HTN and HLD Decreased motivation to increase activity, difficulty managing dietary portions, losing weight- continues to benefit from ongoing reinforcement and support HOH- wife assists in management of care communications/ medications- as/ if needed  RNCM Clinical Goal(s):  Patient will demonstrate ongoing health management independence as evidenced by HTN; HLD        through collaboration with RN Care manager, provider, and care team.   Interventions: 1:1 collaboration with primary care provider regarding development and update of comprehensive plan of care as evidenced by provider attestation and co-signature Inter-disciplinary care team collaboration (see  longitudinal plan of care) Evaluation of current treatment plan related to  self management and patient's adherence to plan as established by provider Review of patient status, including review of consultants reports, relevant laboratory and other test results, and medications completed SDOH updated: no new/ unmet concerns identified Pain assessment updated: denies pain today Falls assessment updated: continues to deny new/ recent falls x 12 months- continues using cane as/ if indicated;  positive reinforcement provided with encouragement to continue efforts at fall prevention; previously provided education around fall risks/ prevention reinforced Reviewed recent cardiology and vein/ vascular office visits: verbalizes good understanding of post-office visit instructions; denies questions; confirms no changes to general plan of care/ medications Reviewed recent ongoing wound clinic provider appointments- he has continues being followed by wound clinic on prn basis secondary to ongoing/ episodic intermittently draining (R) leg ulcerations- reports "comes and goes;"  reports that he continues monitoring wounds daily and performing dressing changes as needed; confirmed he understands to maintain contact with wound clinic for development of any new concerns Reviewed upcoming scheduled provider appointments: 06/02/22 and 06/16/22- vein/ vascular provider; 08/17/22- PCP; patient confirms is aware of all and has plans to attend as scheduled Discussed plans with patient for ongoing care management follow up- patient denies current care coordination/ care management needs and is agreeable to CCM RN CM case closure today; verbalizes understanding to contact PCP or other care providers for any needs that arise in the future, and confirms he has contact information for all care providers     Hyperlipidemia:  (Status: 04/18/22: Goal Met.) Long Term Goal  Lab Results  Component Value Date   CHOL 137 03/30/2022   HDL 48  03/30/2022   [place] 68 03/30/2022   TRIG 115 03/30/2022   CHOLHDL 2.9 03/30/2022    Confirmed no recent/ new medication changes/ concerns: reports continues taking all medications as prescribed  Counseled on importance of regular laboratory monitoring as prescribed; Reviewed importance of limiting foods high in cholesterol;  Hypertension: (Status: 04/18/22: Goal Met.) Long Term Goal  Last practice recorded BP readings:  BP Readings from Last 3 Encounters:  03/30/22 118/72  03/24/22 136/87  02/16/22 130/70  Most recent eGFR/CrCl: No results found for: EGFR  No components found for: CRCL  Evaluation of current treatment plan related to hypertension self management and patient's adherence to plan as established by provider;   Reviewed prescribed diet heart healthy, low salt, low cholesterol Discussed complications of poorly controlled blood pressure such as heart disease, stroke, circulatory complications, vision complications, kidney impairment, sexual dysfunction;  Confirmed patient continues monitoring/ writing down on paper daily weights/ weekly blood pressures- reviewed today; he reports today's blood pressure as "123/77" and he denies recent BP concerns  Confirmed patient continues efforts to adhere to prescribed diet: reinforced previously provided strategies for adherence; positive reinforcement provided with encouragement to continue efforts- he and spouse request additional information around low salt diet, and this was provided Confirmed patient continues to stay as active as "possible;" he reports he is somewhat limited due to the current (R) leg wounds- encouraged ongoing activity as allowed     Plan: No further follow up  required: patient denies current care coordination/ care management needs and is agreeable to CCM RN CM case closure today; CCM RN CM Case closure accordingly     Oneta Rack, RN, BSN, [facility] RN Care Coordination- [place] [phone]: direct office

## 2022-04-18 NOTE — Patient Instructions (Signed)
Visit Information  [name], thank you for taking time to talk with me today  Enclosed is the information you requested about low salt diet: "Low sodium Eating Plan" and "Cooking with less salt"  I have enjoyed working with you and wish you the very best in the future   Oneta Rack, RN, BSN, [place] [phone]: direct office  Low-Sodium Eating Plan Sodium, which is an element that makes up salt, helps you maintain a healthy balance of fluids in your body. Too much sodium can increase your blood pressure and cause fluid and waste to be held in your body. Your health care provider or dietitian may recommend following this plan if you have high blood pressure (hypertension), kidney disease, liver disease, or heart failure. Eating less sodium can help lower your blood pressure, reduce swelling, and protect your heart, liver, and kidneys. What are tips for following this plan? Reading food labels The Nutrition Facts label lists the amount of sodium in one serving of the food. If you eat more than one serving, you must multiply the listed amount of sodium by the number of servings. Choose foods with less than 140 mg of sodium per serving. Avoid foods with 300 mg of sodium or more per serving. Shopping  Look for lower-sodium products, often labeled as "low-sodium" or "no salt added." Always check the sodium content, even if foods are labeled as "unsalted" or "no salt added." Buy fresh foods. Avoid canned foods and pre-made or frozen meals. Avoid canned, cured, or processed meats. Buy breads that have less than 80 mg of sodium per slice. Cooking  Eat more home-cooked food and less restaurant, buffet, and fast food. Avoid adding salt when cooking. Use salt-free seasonings or herbs instead of table salt or sea salt. Check with your health care provider or pharmacist before using salt substitutes. Cook with plant-based oils,  such as canola, sunflower, or olive oil. Meal planning When eating at a restaurant, ask that your food be prepared with less salt or no salt, if possible. Avoid dishes labeled as brined, pickled, cured, smoked, or made with soy sauce, miso, or teriyaki sauce. Avoid foods that contain MSG (monosodium glutamate). MSG is sometimes added to [place] food, bouillon, and some canned foods. Make meals that can be grilled, baked, poached, roasted, or steamed. These are generally made with less sodium. General information Most people on this plan should limit their sodium intake to 1,500-2,000 mg (milligrams) of sodium each day. What foods should I eat? Fruits Fresh, frozen, or canned fruit. Fruit juice. Vegetables Fresh or frozen vegetables. "No salt added" canned vegetables. "No salt added" tomato sauce and paste. Low-sodium or reduced-sodium tomato and vegetable juice. Grains Low-sodium cereals, including oats, puffed wheat and rice, and shredded wheat. Low-sodium crackers. Unsalted rice. Unsalted pasta. Low-sodium bread. Whole-grain breads and whole-grain pasta. Meats and other proteins Fresh or frozen (no salt added) meat, poultry, seafood, and fish. Low-sodium canned tuna and salmon. Unsalted nuts. Dried peas, beans, and lentils without added salt. Unsalted canned beans. Eggs. Unsalted nut butters. Dairy Milk. Soy milk. Cheese that is naturally low in sodium, such as ricotta cheese, fresh mozzarella, or Swiss cheese. Low-sodium or reduced-sodium cheese. Cream cheese. Yogurt. Seasonings and condiments Fresh and dried herbs and spices. Salt-free seasonings. Low-sodium mustard and ketchup. Sodium-free salad dressing. Sodium-free light mayonnaise. Fresh or refrigerated horseradish. Lemon juice. Vinegar. Other foods Homemade, reduced-sodium, or low-sodium soups. Unsalted popcorn and pretzels. Low-salt or salt-free  chips. The items listed above may not be a complete list of foods and beverages you can  eat. Contact a dietitian for more information. What foods should I avoid? Vegetables Sauerkraut, pickled vegetables, and relishes. Olives. [place] fries. Onion rings. Regular canned vegetables (not low-sodium or reduced-sodium). Regular canned tomato sauce and paste (not low-sodium or reduced-sodium). Regular tomato and vegetable juice (not low-sodium or reduced-sodium). Frozen vegetables in sauces. Grains Instant hot cereals. Bread stuffing, pancake, and biscuit mixes. Croutons. Seasoned rice or pasta mixes. Noodle soup cups. Boxed or frozen macaroni and cheese. Regular salted crackers. Self-rising flour. Meats and other proteins Meat or fish that is salted, canned, smoked, spiced, or pickled. Precooked or cured meat, such as sausages or meat loaves. Berniece Salines. Ham. Pepperoni. Hot dogs. Corned beef. Chipped beef. Salt pork. Jerky. Pickled herring. Anchovies and sardines. Regular canned tuna. Salted nuts. Dairy Processed cheese and cheese spreads. Hard cheeses. Cheese curds. Blue cheese. Feta cheese. String cheese. Regular cottage cheese. Buttermilk. Canned milk. Fats and oils Salted butter. Regular margarine. Ghee. Bacon fat. Seasonings and condiments Onion salt, garlic salt, seasoned salt, table salt, and sea salt. Canned and packaged gravies. Worcestershire sauce. Tartar sauce. Barbecue sauce. Teriyaki sauce. Soy sauce, including reduced-sodium. Steak sauce. Fish sauce. Oyster sauce. Cocktail sauce. Horseradish that you find on the shelf. Regular ketchup and mustard. Meat flavorings and tenderizers. Bouillon cubes. Hot sauce. Pre-made or packaged marinades. Pre-made or packaged taco seasonings. Relishes. Regular salad dressings. Salsa. Other foods Salted popcorn and pretzels. Corn chips and puffs. Potato and tortilla chips. Canned or dried soups. Pizza. Frozen entrees and pot pies. The items listed above may not be a complete list of foods and beverages you should avoid. Contact a dietitian for more  information. Summary Eating less sodium can help lower your blood pressure, reduce swelling, and protect your heart, liver, and kidneys. Most people on this plan should limit their sodium intake to 1,500-2,000 mg (milligrams) of sodium each day. Canned, boxed, and frozen foods are high in sodium. Restaurant foods, fast foods, and pizza are also very high in sodium. You also get sodium by adding salt to food. Try to cook at home, eat more fresh fruits and vegetables, and eat less fast food and canned, processed, or prepared foods. This information is not intended to replace advice given to you by your health care provider. Make sure you discuss any questions you have with your health care provider. Document Revised: 11/01/2019 Document Reviewed: 08/28/2019 Elsevier Patient Education  [place] With Less [employer] with less salt is one way to reduce the amount of sodium you get from food. Sodium is one of the elements that make up salt. It is found naturally in foods and is also added to certain foods. Depending on your condition and overall health, your health care provider or dietitian may recommend that you reduce your sodium intake. Most people should have less than 2,300 milligrams (mg) of sodium each day. If you have high blood pressure (hypertension), you may need to limit your sodium to 1,500 mg each day. Follow the tips below to help reduce your sodium intake. What are tips for eating less sodium? Reading food labels  Check the food label before buying or using packaged ingredients. Always check the label for the serving size and sodium content. Look for products with no more than 140 mg of sodium in one serving. Check the % Daily Value column to see what percent of the daily recommended amount of  sodium is provided in one serving of the product. Foods with 5% or less in this column are considered low in sodium. Foods with 20% or higher are considered high in sodium. Do  not choose foods with salt as one of the first three ingredients on the ingredients list. If salt is one of the first three ingredients, it usually means the item is high in sodium. Shopping Buy sodium-free or low-sodium products. Look for the following words on food labels: Low-sodium. Sodium-free. Reduced-sodium. No salt added. Unsalted. Always check the sodium content even if foods are labeled as low-sodium or no salt added. Buy fresh foods. Cooking Use herbs, seasonings without salt, and spices as substitutes for salt. Use sodium-free baking soda when baking. Grill, braise, or roast foods to add flavor with less salt. Avoid adding salt to pasta, rice, or hot cereals. Drain and rinse canned vegetables, beans, and meat before use. Avoid adding salt when cooking sweets and desserts. Cook with low-sodium ingredients. What foods are high in sodium? Vegetables Regular canned vegetables (not low-sodium or reduced-sodium). Sauerkraut, pickled vegetables, and relishes. Olives. [place] fries. Onion rings. Regular canned tomato sauce and paste. Regular tomato and vegetable juice. Frozen vegetables in sauces. Grains Instant hot cereals. Bread stuffing, pancake, and biscuit mixes. Croutons. Seasoned rice or pasta mixes. Noodle soup cups. Boxed or frozen macaroni and cheese. Regular salted crackers. Self-rising flour. Rolls. Bagels. Flour tortillas and wraps. Meats and other proteins Meat or fish that is salted, canned, smoked, cured, spiced, or pickled. This includes bacon, ham, sausages, hot dogs, corned beef, chipped beef, meat loaves, salt pork, jerky, pickled herring, anchovies, regular canned tuna, and sardines. Salted nuts. Dairy Processed cheese and cheese spreads. Cheese curds. Blue cheese. Feta cheese. String cheese. Regular cottage cheese. Buttermilk. Canned milk. The items listed above may not be a complete list of foods high in sodium. Actual amounts of sodium may be different depending  on processing. Contact a dietitian for more information. What foods are low in sodium? Fruits Fresh, frozen, or canned fruit with no sauce added. Fruit juice. Vegetables Fresh or frozen vegetables with no sauce added. "No salt added" canned vegetables. "No salt added" tomato sauce and paste. Low-sodium or reduced-sodium tomato and vegetable juice. Grains Noodles, pasta, quinoa, rice. Shredded or puffed wheat or puffed rice. Regular or quick oats (not instant). Low-sodium crackers. Low-sodium bread. Whole-grain bread and whole-grain pasta. Unsalted popcorn. Meats and other proteins Fresh or frozen whole meats, poultry (not injected with sodium), and fish with no sauce added. Unsalted nuts. Dried peas, beans, and lentils without added salt. Unsalted canned beans. Eggs. Unsalted nut butters. Low-sodium canned tuna or chicken. Dairy Milk. Soy milk. Yogurt. Low-sodium cheeses, such as Swiss, Monte[place] Jack, [place], and [employer]. Sherbet or ice cream (keep to  cup per serving). Cream cheese. Fats and oils Unsalted butter or margarine. Other foods Homemade pudding. Sodium-free baking soda and baking powder. Herbs and spices. Low-sodium seasoning mixes. Beverages Coffee and tea. Carbonated beverages. The items listed above may not be a complete list of foods low in sodium. Actual amounts of sodium may be different depending on processing. Contact a dietitian for more information. What are some salt alternatives when cooking? The following are herbs, seasonings, and spices that can be used instead of salt to flavor your food. Herbs should be fresh or dried. Do not choose packaged mixes. Next to the name of the herb, spice, or seasoning are some examples of foods you can pair it with. Herbs [place] leaves -  Soups, meat and vegetable dishes, and spaghetti sauce. Basil - [employer], soups, pasta, and fish dishes. Cilantro - Meat, poultry, and vegetable dishes. Chili powder - Marinades and Mexican  dishes. Chives - Salad dressings and potato dishes. Cumin - Mexican dishes, couscous, and meat dishes. Dill - Fish dishes, sauces, and salads. Fennel - Meat and vegetable dishes, breads, and cookies. Garlic (do not use garlic salt) - [place] dishes, meat dishes, salad dressings, and sauces. Marjoram - Soups, potato dishes, and meat dishes. Oregano - Pizza and spaghetti sauce. Parsley - Salads, soups, pasta, and meat dishes. Rosemary - [place] dishes, salad dressings, soups, and red meats. Saffron - Fish dishes, pasta, and some poultry dishes. Sage - Stuffings and sauces. Tarragon - Fish and [employer]. Thyme - Stuffing, meat, and fish dishes. Seasonings Lemon juice - Fish dishes, poultry dishes, vegetables, and salads. Vinegar - Salad dressings, vegetables, and fish dishes. Spices Cinnamon - Sweet dishes, such as cakes, cookies, and puddings. Cloves - Gingerbread, puddings, and marinades for meats. Curry - Vegetable dishes, fish and poultry dishes, and stir-fry dishes. Ginger - Vegetable dishes, fish dishes, and stir-fry dishes. Nutmeg - Pasta, vegetables, poultry, fish dishes, and custard. Summary Cooking with less salt is one way to reduce the amount of sodium that you get from food. Buy sodium-free or low-sodium products. Check the food label before using or buying packaged ingredients. Use herbs, seasonings without salt, and spices as substitutes for salt in foods. This information is not intended to replace advice given to you by your health care provider. Make sure you discuss any questions you have with your health care provider. Document Revised: 09/18/2019 Document Reviewed: 09/18/2019 Elsevier Patient Education  [place].

## 2022-04-20 NOTE — Telephone Encounter (Signed)
   [name] is calling to f/u clearance. Wanted to know if the pt is ok to hold xarelto

## 2022-04-20 NOTE — Telephone Encounter (Signed)
Once labs are reviewed, please update [name] at [facility]

## 2022-04-25 ENCOUNTER — Telehealth: Payer: Self-pay | Admitting: Oncology

## 2022-04-25 LAB — LUPUS ANTICOAGULANT PANEL
Dilute Viper Venom Time: 55.2 s — ABNORMAL HIGH (ref 0.0–47.0)
PTT Lupus Anticoagulant: 37.4 s (ref 0.0–43.5)

## 2022-04-25 LAB — PROTEIN C, TOTAL: Protein C Antigen: 96 % (ref 60–150)

## 2022-04-25 LAB — BETA-2-GLYCOPROTEIN I ABS, IGG/M/A
Beta-2 Glyco 1 IgA: <9 GPI IgA units (ref 0–25)
Beta-2 Glyco 1 IgM: <9 GPI IgM units (ref 0–32)
Beta-2 Glyco I IgG: <9 GPI IgG units (ref 0–20)

## 2022-04-25 LAB — DRVVT MIX: dRVVT Mix: 47.9 s — ABNORMAL HIGH (ref 0.0–40.4)

## 2022-04-25 LAB — ANTITHROMBIN III: AntiThromb III Func: 115 % (ref 75–135)

## 2022-04-25 LAB — CARDIOLIPIN ANTIBODIES, IGG, IGM, IGA
Anticardiolipin IgA: <9 APL U/mL (ref 0–11)
Anticardiolipin IgG: 21 GPL U/mL — ABNORMAL HIGH (ref 0–14)
Anticardiolipin IgM: <9 MPL U/mL (ref 0–12)

## 2022-04-25 LAB — FACTOR 5 LEIDEN

## 2022-04-25 LAB — PROTHROMBIN GENE MUTATION

## 2022-04-25 LAB — PROTEIN S, TOTAL: Protein S Ag, Total: 116 % (ref 60–150)

## 2022-04-25 LAB — PROTEIN C ACTIVITY: Protein C Activity: 127 % (ref 73–180)

## 2022-04-25 LAB — DRVVT CONFIRM: dRVVT Confirm: 1.2 ratio (ref 0.8–1.2)

## 2022-04-25 LAB — PROTEIN S ACTIVITY: Protein S Activity: 79 % (ref 63–140)

## 2022-04-25 LAB — HOMOCYSTEINE: Homocysteine: 11.4 umol/L (ref 0.0–19.2)

## 2022-04-25 NOTE — Telephone Encounter (Signed)
Attempted to contact patient in regards to referral. No answer so voicemail was left for patient to call back  

## 2022-04-25 NOTE — Telephone Encounter (Signed)
I will send this to pre op pool for further advice/help.

## 2022-04-25 NOTE — Addendum Note (Signed)
Addended by: Stephani Police on: 04/25/2022 10:17 AM   Modules accepted: Orders

## 2022-04-25 NOTE — Telephone Encounter (Signed)
[name] - [facility] [phone]   Left a message for [name] to call back.   Referral placed for hematology consult per Dr Harrington Challenger.   Hypercoagulable panel is not normal  Some still not resulted Hx of massive PE in past Plan for surgery in August with plan to hold Eliquis Given lab findings need to have hematology input re need for bridge    I spoke with and advised the pt and his wife and they are okay with the referral and agree to postpone the procedure for 06/02/22 if necessary.

## 2022-04-26 NOTE — Telephone Encounter (Signed)
In following up with the pts chart... pt has an appt with Dr Benay Spice hematology on 05/09/22.

## 2022-05-09 ENCOUNTER — Encounter: Payer: Self-pay | Admitting: Nurse Practitioner

## 2022-05-09 ENCOUNTER — Inpatient Hospital Stay: Payer: Medicare Other

## 2022-05-09 ENCOUNTER — Inpatient Hospital Stay: Payer: Medicare Other | Attending: Nurse Practitioner | Admitting: Nurse Practitioner

## 2022-05-09 VITALS — BP 138/79 | HR 57 | Temp 98.1°F | Resp 18 | Ht 74.0 in | Wt 321.6 lb

## 2022-05-09 DIAGNOSIS — Z86711 Personal history of pulmonary embolism: Secondary | ICD-10-CM | POA: Diagnosis not present

## 2022-05-09 DIAGNOSIS — I10 Essential (primary) hypertension: Secondary | ICD-10-CM

## 2022-05-09 DIAGNOSIS — E785 Hyperlipidemia, unspecified: Secondary | ICD-10-CM

## 2022-05-09 DIAGNOSIS — Z87891 Personal history of nicotine dependence: Secondary | ICD-10-CM | POA: Diagnosis not present

## 2022-05-09 DIAGNOSIS — Z85820 Personal history of malignant melanoma of skin: Secondary | ICD-10-CM

## 2022-05-09 DIAGNOSIS — E669 Obesity, unspecified: Secondary | ICD-10-CM | POA: Diagnosis not present

## 2022-05-09 DIAGNOSIS — I2510 Atherosclerotic heart disease of native coronary artery without angina pectoris: Secondary | ICD-10-CM

## 2022-05-09 DIAGNOSIS — Z7901 Long term (current) use of anticoagulants: Secondary | ICD-10-CM

## 2022-05-09 NOTE — Progress Notes (Signed)
New Hematology/Oncology Consult   Requesting [name]: Dr. Dorris Carnes  [phone]      Reason for Consult: History of pulmonary embolism  HPI: [name] is a 76 year old man referred due to history of pulmonary embolism without provoking factor, currently on Xarelto, surgery planned in August, question need for bridge.  He is scheduled for endovenous laser ablation of the great saphenous vein on the left by [name] 06/02/2022.  Per [name] office note dated 03/24/2022 plan is to hold Xarelto for 72 hours before and likely 48 hours after the procedure.  He presented 08/20/2019 with chest pain and shortness of breath.  CT showed a large saddle pulmonary embolism with extension into the lobar branches bilaterally.  There was CT evidence of right-sided heart strain.  Groundglass airspace opacity left lower lobe favored to represent evolving pulmonary infarct.  Trace left-sided pleural effusion.  There are bilateral pulmonary nodules measuring up to 6 mm with follow-up noncontrast chest CT recommended at a 3 to 56-monthinterval.  He was discharged home on Xarelto and has continued this.  Follow-up noncontrast chest CT 01/30/2020 showed stable bilateral lung nodules largest measuring 6 mm in the left upper lobe.  Hypercoagulable panel done 04/15/2022 remarkable for negative lupus anticoagulant, dilute viper venom time elevated at 55.2 (0-47), DRVVT mix mildly elevated 47.9; anticardiolipin IgG elevated at 21 (0-14), IgM and IgA in normal range.  The panel was obtained while he was on Xarelto.  Prior to the PE he reports no prolonged travel, immobility or surgery.  He denies a history of DVT, phlebitis, stroke.   Past Medical History:  Diagnosis Date   Arthritis    CAD S/P percutaneous coronary angioplasty 2011   stents x 3   Generalized anxiety disorder    Hard of hearing    Hyperlipidemia    Hypertension    Lower GI bleed 2014   colonscopy was normal   [place]     Past Surgical History:  Procedure Laterality Date   CORONARY ANGIOPLASTY WITH STENT PLACEMENT     FRACTURE SURGERY  1995   right elbow   KNEE ARTHROSCOPY W/ MENISCAL REPAIR       Current Outpatient Medications:    acetaminophen (TYLENOL) 325 MG tablet, Take 2 tablets (650 mg total) by mouth every 6 (six) hours as needed for mild pain (or Fever >/= 101)., Disp: 12 tablet, Rfl: 0   ALPRAZolam (XANAX) 0.5 MG tablet, Take 1 tablet (0.5 mg total) by mouth daily as needed for anxiety., Disp: 30 tablet, Rfl: 1   Docusate Sodium (COLACE PO), Take by mouth daily., Disp: , Rfl:    furosemide (LASIX) 40 MG tablet, TAKE 1 TABLET (40 MG TOTAL) BY MOUTH DAILY. TAKE 1/2 TAB FOR 5 DAYS BEFORE TAKING WHOLE TAB., Disp: 90 tablet, Rfl: 1   lisinopril (ZESTRIL) 10 MG tablet, Take 1 tablet (10 mg total) by mouth daily., Disp: 90 tablet, Rfl: 3   pantoprazole (PROTONIX) 40 MG tablet, Take 1 tablet (40 mg total) by mouth daily., Disp: 90 tablet, Rfl: 3   rosuvastatin (CRESTOR) 20 MG tablet, Take 1 tablet (20 mg total) by mouth daily., Disp: 90 tablet, Rfl: 3   Sennosides (SENOKOT PO), Take by mouth daily., Disp: , Rfl:    rivaroxaban (XARELTO) 20 MG TABS tablet, Take 1 tablet (20 mg total) by mouth daily with supper., Disp: 90 tablet, Rfl: 3:    No Known Allergies:  FH: Father with varicose veins, "blood clots",  history of PE, took Coumadin  SOCIAL HISTORY: He lives in [place] with his wife.  He has 2 children.  He is retired.  Very remote tobacco use, smoked 2 years in his early 74s.  Occasional EtOH intake prior to being on Xarelto, none since starting Xarelto.  Review of Systems: He has a good appetite.  Good energy level.  No fevers or sweats.  No unintentional weight loss.  Bilateral knee pain which she attributes to arthritis.  No abdominal pain.  No change in bowel habits.  Last colonoscopy about 8 years ago.  No bloody or black bowel movements.  No urinary symptoms.  No dysphagia.   No shortness of breath or cough.  Physical Exam:  Blood pressure 138/79, pulse (!) 57, temperature 98.1 F (36.7 C), temperature source Oral, resp. rate 18, height '6\' 2"'$  (1.88 m), weight (!) 321 lb 9.6 oz (145.9 kg), SpO2 95 %.  HEENT: No thrush or ulcers. Lungs: Lungs clear bilaterally. Cardiac: Regular rate and rhythm. Abdomen: Protuberant.  No hepatosplenomegaly. Vascular: Firm edema at the lower legs bilaterally, chronic stasis change.  Bandage right lower inner leg. Lymph nodes: No palpable cervical, supraclavicular, axillary or inguinal lymph nodes. Neurologic: Alert and oriented. Skin: Multiple scars on the back.  LABS:  No results for input(s): "WBC", "HGB", "HCT", "PLT" in the last 72 hours.  No results for input(s): "NA", "K", "CL", "CO2", "GLUCOSE", "BUN", "CREATININE", "CALCIUM" in the last 72 hours.    RADIOLOGY:  No results found.  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  He is on indefinite anticoagulation with Xarelto.  Only identifiable risk factor for the blood clot is obesity.  He was referred for a recommendation regarding need for bridging anticoagulation with upcoming endovenous laser ablation of the great saphenous vein.  Dr. Gearldine Shown recommendation is to hold Xarelto 2 days prior to the procedure and resume as soon as possible following  the procedure, optimally within 1 to 2 days, with no bridge.  We reviewed the hypercoagulable panel with [name] and his wife.  The panel was obtained while on Xarelto.  Xarelto is the likely cause of the dilute viper venom time elevation and DRVVT mix elevation.  Anticardiolipin IgG was mildly elevated, unclear significance.  Recommend this be repeated in 3 to 4 months.  We referred him for a noncontrast CT scan of the chest to follow-up on the bilateral pulmonary nodules and will contact him once the result is available.  We did not schedule additional follow-up in our office but are available to see him in the future if needed.   Patient seen with Dr. Benay Spice.    Bryan Card, [name] 05/09/2022, 3:17 PM   [name] was interviewed and examined.  He is referred for hematology evaluation in the setting of remote pulmonary embolism.  He is maintained on Xarelto anticoagulation and is scheduled to have left leg vein ablation surgery.  A hypercoagulation panel was negative other than  a mildly elevated anticardiolipin IgG-likely nonspecific.  The elevated DRVVT is likely secondary to Xarelto.  Obesity is his only apparent risk factor for venous thrombosis.  I recommend a repeat anticardiolipin IgG level in approximately 3 months.    I think it is reasonable to hold Xarelto anticoagulation for 2 days prior to the planned surgery and resume anticoagulation as soon as possible following surgery.  I do not recommend bridging anticoagulation.  Recommendations: Hold Xarelto anticoagulation prior to surgery and resume as soon as possible following surgery-usually day 1 or 2 postsurgery, per discretion of the surgeon Repeat anticardiolipin IgG in 3 months, reconsult hematology if the level returns higher-we would consider changing to Coumadin anticoagulation Repeat chest CT to follow-up on lung nodules  He is not scheduled for a follow-up appointment in the hematology clinic.  We are available to see  him in the future as needed. I was present for greater than 50% of today's visit.  I performed medical decision making.  Bryan Manson, [name]

## 2022-05-10 NOTE — Telephone Encounter (Signed)
I reviewed note from Dr Marina Gravel OK to hold anticoagulant prior to surgery   Resume after     Should have forwarded to surgery

## 2022-05-11 NOTE — Telephone Encounter (Signed)
   Patient Name: [name]  DOB: Jan 28, 1946 MRN: [id number]  Primary Cardiologist: None  Chart reviewed as part of pre-operative protocol coverage. Pre-op clearance already addressed by colleagues in earlier phone notes. To summarize recommendations:  -Dr. Benay Spice with hematology felt it was okay to hold anticoagulation prior to surgery.  Resume after.  Will route this bundled recommendation to requesting provider via Epic fax function and remove from pre-op pool. Please call with questions.  Elgie Collard, PA-C 05/11/2022, 8:08 AM

## 2022-05-12 ENCOUNTER — Telehealth: Payer: Self-pay | Admitting: *Deleted

## 2022-05-12 NOTE — Telephone Encounter (Signed)
CT chest scheduled at [facility] for 05/16/22 at 1130 with 11:15 arrival. Will be without contrast, so there is no prep and OK to eat prior. Left VM for patient to check his Mychart for specifics regarding appointment and how to reschedule.

## 2022-05-16 ENCOUNTER — Ambulatory Visit (HOSPITAL_COMMUNITY)
Admission: RE | Admit: 2022-05-16 | Discharge: 2022-05-16 | Disposition: A | Discharge status: Home / Self Care | Payer: Medicare Other | Source: Ambulatory Visit | Attending: Nurse Practitioner | Admitting: Nurse Practitioner

## 2022-05-16 DIAGNOSIS — Z86711 Personal history of pulmonary embolism: Secondary | ICD-10-CM | POA: Insufficient documentation

## 2022-05-16 DIAGNOSIS — R918 Other nonspecific abnormal finding of lung field: Secondary | ICD-10-CM | POA: Diagnosis not present

## 2022-05-25 ENCOUNTER — Telehealth: Payer: Self-pay

## 2022-05-25 NOTE — Telephone Encounter (Signed)
-----   Message from Owens Shark, NP sent at 05/25/2022  7:42 AM EDT ----- Please let him know lung nodules are stable. Please forward CT report to PCP. Thanks

## 2022-05-25 NOTE — Telephone Encounter (Signed)
Patient's wife gave verbal understanding and had no further questions or concerns at this time. I routed the Ct report to the PCP.

## 2022-05-26 ENCOUNTER — Other Ambulatory Visit: Payer: Self-pay | Admitting: *Deleted

## 2022-05-26 MED ORDER — LORAZEPAM 1 MG PO TABS: ORAL_TABLET | ORAL | 0 refills | Status: DC

## 2022-05-26 NOTE — Progress Notes (Signed)
Called in new prescription for [name] in preparation for office surgery to [place]. [address] [place] [id number]  [phone].  Lorezapam 1 mg Total 3 tablets 0 refills  Instructions :Take 2 tablets 30 minutes prior to leaving house on day of office surgery.  Bring third tablet with you to office on day of office surgery.

## 2022-06-02 ENCOUNTER — Ambulatory Visit (INDEPENDENT_AMBULATORY_CARE_PROVIDER_SITE_OTHER): Payer: Medicare Other | Admitting: Vascular Surgery

## 2022-06-02 ENCOUNTER — Encounter: Payer: Self-pay | Admitting: Vascular Surgery

## 2022-06-02 VITALS — BP 118/76 | HR 53 | Temp 97.8°F | Resp 16 | Ht 74.0 in | Wt 313.0 lb

## 2022-06-02 DIAGNOSIS — I872 Venous insufficiency (chronic) (peripheral): Secondary | ICD-10-CM

## 2022-06-02 DIAGNOSIS — I83812 Varicose veins of left lower extremities with pain: Secondary | ICD-10-CM

## 2022-06-02 HISTORY — PX: ENDOVENOUS ABLATION SAPHENOUS VEIN W/ LASER: SUR449

## 2022-06-02 NOTE — Progress Notes (Signed)
     Laser Ablation Procedure     Date: 06/02/2022   [name] DOB:September 06, 1946  Consent signed: Yes      Surgeon: Servando Snare MD   Procedure: Laser Ablation: left Greater Saphenous Vein  BP 118/76 (BP Location: Left Arm, Patient Position: Sitting, Cuff Size: Large)   Pulse (!) 53   Temp 97.8 F (36.6 C) (Temporal)   Resp 16   Ht '6\' 2"'$  (1.88 m)   Wt (!) 313 lb (142 kg)   SpO2 95%   BMI 40.19 kg/m   Tumescent Anesthesia: 400 cc 0.9% NaCl with 50 cc Lidocaine HCL 1%  and 15 cc 8.4% NaHCO3  Local Anesthesia: 10 cc Lidocaine HCL and NaHCO3 (ratio 2:1)  7 watts continuous mode     Total energy: 1656 Joules     Total time: 236 seconds  Treatment Length  33 cm   Laser Fiber Ref. #   [id number]  Lot # [id number]     Patient tolerated procedure well  Notes: [name] last dose of Xarelto was on 05-30-2022.  [name] took Ativan 1 mg (2 tablets) on 08-24-203 at 9:00 AM.    Description of Procedure:  After marking the course of the secondary varicosities, the patient was placed on the operating table in the supine position, and the left leg was prepped and draped in sterile fashion.   Local anesthetic was administered and under ultrasound guidance the saphenous vein was accessed with a micro needle and guide wire; then the mirco puncture sheath was placed.  A guide wire was inserted saphenofemoral junction , followed by a 5 french sheath.  The position of the sheath and then the laser fiber below the junction was confirmed using the ultrasound.  Tumescent anesthesia was administered along the course of the saphenous vein using ultrasound guidance. The patient was placed in Trendelenburg position and protective laser glasses were placed on patient and staff, and the laser was fired at 7 watts continuous mode for a total of 1656 joules.       Steri strip was applied to the IV insertion site and ABD pads and thigh high compression stockings were applied.  Ace wrap bandages  were applied over the left thigh and at the top of the saphenofemoral junction. Blood loss was less than 15 cc.  Discharge instructions reviewed with patient and hardcopy of discharge instructions given to patient to take home. The patient ambulated out of the operating room having tolerated the procedure well.

## 2022-06-02 NOTE — Progress Notes (Signed)
      Patient name: [name] MRN: [id number] DOB: 07-21-1946 Sex: male  REASON FOR VISIT: Left greater saphenous vein ablation  HPI: [name] is a 76 y.o. male with history of C5 venous disease he has been compliant with his thigh-high compressive stockings and we have reviewed the need for great saphenous vein ablation on the left to prevent ulcer recurrence.  He has been off Xarelto for 72 hours prior to presentation today.  Current Outpatient Medications  Medication Sig Dispense Refill   acetaminophen (TYLENOL) 325 MG tablet Take 2 tablets (650 mg total) by mouth every 6 (six) hours as needed for mild pain (or Fever >/= 101). 12 tablet 0   ALPRAZolam (XANAX) 0.5 MG tablet Take 1 tablet (0.5 mg total) by mouth daily as needed for anxiety. 30 tablet 1   Docusate Sodium (COLACE PO) Take by mouth daily.     furosemide (LASIX) 40 MG tablet TAKE 1 TABLET (40 MG TOTAL) BY MOUTH DAILY. TAKE 1/2 TAB FOR 5 DAYS BEFORE TAKING WHOLE TAB. 90 tablet 1   lisinopril (ZESTRIL) 10 MG tablet Take 1 tablet (10 mg total) by mouth daily. 90 tablet 3   LORazepam (ATIVAN) 1 MG tablet Take 2 tablets 30 minutes prior to leaving house on day of office surgery.  Bring third tablet with you to office on day of office surgery. 2 tablet 0   pantoprazole (PROTONIX) 40 MG tablet Take 1 tablet (40 mg total) by mouth daily. 90 tablet 3   rivaroxaban (XARELTO) 20 MG TABS tablet Take 1 tablet (20 mg total) by mouth daily with supper. 90 tablet 3   rosuvastatin (CRESTOR) 20 MG tablet Take 1 tablet (20 mg total) by mouth daily. 90 tablet 3   Sennosides (SENOKOT PO) Take by mouth daily.     No current facility-administered medications for this visit.    PHYSICAL EXAM: Vitals:   06/02/22 1121  BP: 118/76  Pulse: (!) 53  Resp: 16  Temp: 97.8 F (36.6 C)  SpO2: 95%     He is awake alert and oriented Nonlabored respirations Left greater saphenous vein identified with patient in supine position using  ultrasound guidance   PROCEDURE: Endovenous laser ablation left greater saphenous vein totaling 33 cm    TECHNIQUE: The patient was placed supine on the procedure table and sterilely prepped and draped in the left lower extremity.  A timeout was called.  The left greater saphenous vein was identified using ultrasound guidance and the area was anesthetized with 1% lidocaine and this was cannulated at the level of the knee with a micropuncture needle followed by wire and a micropuncture sheath.  A J-wire was then placed 2.5 cm from the saphenofemoral junction which was easily visualized.  A 45cm laser sheath was then brought and placed just to the level of the J-wire 2.5 cm from the left saphenofemoral junction.  The laser was introduced.  We then anesthetized with tumescent anesthesia along the level of the expected laser path and then performed laser ablation for a total of 33 cm.  At completion the left common femoral vein was patent and easily compressed and the left greater saphenous vein did appear sclerotic throughout its course all the way up to the junction. A sterile compressive dressing was placed.  He tolerated the procedure well without immediate complication.      Servando Snare [facility]

## 2022-06-15 DIAGNOSIS — Z1283 Encounter for screening for malignant neoplasm of skin: Secondary | ICD-10-CM | POA: Diagnosis not present

## 2022-06-15 DIAGNOSIS — L720 Epidermal cyst: Secondary | ICD-10-CM | POA: Diagnosis not present

## 2022-06-15 DIAGNOSIS — D225 Melanocytic nevi of trunk: Secondary | ICD-10-CM | POA: Diagnosis not present

## 2022-06-15 DIAGNOSIS — Z08 Encounter for follow-up examination after completed treatment for malignant neoplasm: Secondary | ICD-10-CM | POA: Diagnosis not present

## 2022-06-15 DIAGNOSIS — Z85820 Personal history of malignant melanoma of skin: Secondary | ICD-10-CM | POA: Diagnosis not present

## 2022-06-16 ENCOUNTER — Ambulatory Visit (HOSPITAL_COMMUNITY)
Admission: RE | Admit: 2022-06-16 | Discharge: 2022-06-16 | Disposition: A | Discharge status: Home / Self Care | Payer: Medicare Other | Source: Ambulatory Visit | Attending: Vascular Surgery | Admitting: Vascular Surgery

## 2022-06-16 ENCOUNTER — Encounter: Payer: Self-pay | Admitting: Vascular Surgery

## 2022-06-16 ENCOUNTER — Ambulatory Visit (INDEPENDENT_AMBULATORY_CARE_PROVIDER_SITE_OTHER): Payer: Medicare Other | Admitting: Vascular Surgery

## 2022-06-16 VITALS — BP 122/77 | HR 67 | Temp 97.9°F | Resp 16 | Ht 74.0 in | Wt 316.0 lb

## 2022-06-16 DIAGNOSIS — I83812 Varicose veins of left lower extremities with pain: Secondary | ICD-10-CM | POA: Diagnosis not present

## 2022-06-16 DIAGNOSIS — I872 Venous insufficiency (chronic) (peripheral): Secondary | ICD-10-CM | POA: Diagnosis not present

## 2022-06-16 NOTE — Progress Notes (Signed)
Patient ID: [name], male   DOB: Mar 11, 1946, 76 y.o.   MRN: [id number]  Reason for Consult: Routine Post Op and Varicose Veins (2 week f/u)   Referred by Horald Pollen, *  Subjective:     HPI:  [name] is a 76 y.o. male with a history of C5 venous disease of the right lower extremity and 2 weeks ago underwent right greater saphenous vein ablation for prevention of recurrent ulceration.  He has remained in compression stockings.  He returned to taking Xarelto.  No real complaints today.  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   Family History  Problem Relation Age of Onset   Heart disease Mother 28       heart attack   Lung disease Father        asbestosis   Clotting disorder Father    Asthma Daughter    Hyperlipidemia Daughter    Alcohol abuse Paternal Uncle    Cancer Paternal Grandfather        lung   Past Surgical History:  Procedure Laterality Date   CORONARY ANGIOPLASTY WITH STENT PLACEMENT     ENDOVENOUS ABLATION SAPHENOUS VEIN W/ LASER Left 06/02/2022   endovenous laser ablation left greater saphenous vein by Servando Snare MD   [facility]   right elbow   KNEE ARTHROSCOPY W/ MENISCAL REPAIR      Short Social History:  Social History   Tobacco Use   Smoking status: Former    Packs/day: 0.30    Types: Cigarettes    Start date: 08/31/1966    Quit date: 08/31/1969    Years since quitting: 52.8   Smokeless tobacco: Never  Substance Use Topics   Alcohol use: Yes    Alcohol/week: 3.0 standard drinks of alcohol    Types: 2 Cans of beer, 1 Shots of liquor per week    No Known Allergies  Current Outpatient Medications  Medication Sig Dispense Refill   acetaminophen  (TYLENOL) 325 MG tablet Take 2 tablets (650 mg total) by mouth every 6 (six) hours as needed for mild pain (or Fever >/= 101). 12 tablet 0   ALPRAZolam (XANAX) 0.5 MG tablet Take 1 tablet (0.5 mg total) by mouth daily as needed for anxiety. 30 tablet 1   Docusate Sodium (COLACE PO) Take by mouth daily.     furosemide (LASIX) 40 MG tablet TAKE 1 TABLET (40 MG TOTAL) BY MOUTH DAILY. TAKE 1/2 TAB FOR 5 DAYS BEFORE TAKING WHOLE TAB. 90 tablet 1   lisinopril (ZESTRIL) 10 MG tablet Take 1 tablet (10 mg total) by mouth daily. 90 tablet 3   pantoprazole (PROTONIX) 40 MG tablet Take 1 tablet (40 mg total) by mouth daily. 90 tablet 3   rosuvastatin (CRESTOR) 20 MG tablet Take 1 tablet (20 mg total) by mouth daily. 90 tablet 3   Sennosides (SENOKOT PO) Take by mouth daily.     LORazepam (ATIVAN) 1 MG tablet Take 2 tablets 30 minutes prior to leaving house on day of office surgery.  Bring third tablet with you to office on day of office surgery. (Patient not taking: Reported on 06/16/2022) 2 tablet 0  rivaroxaban (XARELTO) 20 MG TABS tablet Take 1 tablet (20 mg total) by mouth daily with supper. 90 tablet 3   No current facility-administered medications for this visit.    Review of Systems  Constitutional:  Constitutional negative. HENT: HENT negative.  Eyes: Eyes negative.  Respiratory: Respiratory negative.  Cardiovascular: Cardiovascular negative.  GI: Gastrointestinal negative.  Musculoskeletal: Musculoskeletal negative.  Skin: Skin negative.  Neurological: Neurological negative. Hematologic: Hematologic/lymphatic negative.  Psychiatric: Psychiatric negative.        Objective:  Objective   Vitals:   06/16/22 1018  BP: 122/77  Pulse: 67  Resp: 16  Temp: 97.9 F (36.6 C)  TempSrc: Temporal  SpO2: 95%  Weight: (!) 316 lb (143.3 kg)  Height: '6\' 2"'$  (1.88 m)   Body mass index is 40.57 kg/m.  Physical Exam HENT:     Head: Normocephalic.     Nose: Nose normal.  Eyes:     Pupils:  Pupils are equal, round, and reactive to light.  Pulmonary:     Effort: Pulmonary effort is normal.  Abdominal:     General: Abdomen is flat.     Palpations: Abdomen is soft.  Musculoskeletal:     Comments: Trace edema left lower extremity, skin all appears intact and healthy  Skin:    General: Skin is warm.     Capillary Refill: Capillary refill takes less than 2 seconds.  Neurological:     Mental Status: He is alert.  Psychiatric:        Mood and Affect: Mood normal.        Behavior: Behavior normal.     Data: LEFT     Reflux NoRefluxReflux TimeDiameter cmsComments                     Yes                                   +---------+---------+------+-----------+------------+--------+  CFV                                            patent    +---------+---------+------+-----------+------------+--------+  FV mid                                         patent    +---------+---------+------+-----------+------------+--------+  Popliteal                                      patent    +---------+---------+------+-----------+------------+--------+           Summary:  Left:  - No evidence of deep vein thrombosis from the common femoral through the  popliteal veins.  - Successful ablation of the great saphenous vein from the distal thigh up  to approximately 1.61 cm from the saphenofemoral junction.      Assessment/Plan:    76 year old male status post greater saphenous vein ablation for C5 venous disease.  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     Waynetta Sandy MD [facility]

## 2022-06-26 ENCOUNTER — Other Ambulatory Visit: Payer: Self-pay | Admitting: Emergency Medicine

## 2022-06-27 ENCOUNTER — Telehealth: Payer: Self-pay | Admitting: Emergency Medicine

## 2022-06-27 NOTE — Telephone Encounter (Signed)
LVM for pt to rtn my call to schedule AWV with [facility] call back # [phone] 

## 2022-07-05 ENCOUNTER — Ambulatory Visit (INDEPENDENT_AMBULATORY_CARE_PROVIDER_SITE_OTHER): Payer: Medicare Other

## 2022-07-05 VITALS — Wt 316.0 lb

## 2022-07-05 DIAGNOSIS — Z0000 Encounter for general adult medical examination without abnormal findings: Secondary | ICD-10-CM | POA: Diagnosis not present

## 2022-07-05 NOTE — Patient Instructions (Signed)
[name] , Thank you for taking time to come for your Medicare Wellness Visit. I appreciate your ongoing commitment to your health goals. Please review the following plan we discussed and let me know if I can assist you in the future.   Screening recommendations/referrals: Colonoscopy: 10/11/15 Recommended yearly ophthalmology/optometry visit for glaucoma screening and checkup Recommended yearly dental visit for hygiene and checkup  Vaccinations: Influenza vaccine: 08/18/21 Pneumococcal vaccine: 04/10/18 Tdap vaccine: n/d Shingles vaccine: n/d   Covid-19: 12/09/19, 01/06/20, 08/08/20, 04/01/21  Advanced directives: no  Conditions/risks identified: none  Next appointment: Follow up in one year for your annual wellness visit. 07/07/23 @ 8 am by phone  Preventive Care 65 Years and Older, Male Preventive care refers to lifestyle choices and visits with your health care provider that can promote health and wellness. What does preventive care include? A yearly physical exam. This is also called an annual well check. Dental exams once or twice a year. Routine eye exams. Ask your health care provider how often you should have your eyes checked. Personal lifestyle choices, including: Daily care of your teeth and gums. Regular physical activity. Eating a healthy diet. Avoiding tobacco and drug use. Limiting alcohol use. Practicing safe sex. Taking low doses of aspirin every day. Taking vitamin and mineral supplements as recommended by your health care provider. What happens during an annual well check? The services and screenings done by your health care provider during your annual well check will depend on your age, overall health, lifestyle risk factors, and family history of disease. Counseling  Your health care provider may ask you questions about your: Alcohol use. Tobacco use. Drug use. Emotional well-being. Home and relationship well-being. Sexual activity. Eating habits. History  of falls. Memory and ability to understand (cognition). Work and work Statistician. Screening  You may have the following tests or measurements: Height, weight, and BMI. Blood pressure. Lipid and cholesterol levels. These may be checked every 5 years, or more frequently if you are over 53 years old. Skin check. Lung cancer screening. You may have this screening every year starting at age 27 if you have a 30-pack-year history of smoking and currently smoke or have quit within the past 15 years. Fecal occult blood test (FOBT) of the stool. You may have this test every year starting at age 65. Flexible sigmoidoscopy or colonoscopy. You may have a sigmoidoscopy every 5 years or a colonoscopy every 10 years starting at age 11. Prostate cancer screening. Recommendations will vary depending on your family history and other risks. Hepatitis C blood test. Hepatitis B blood test. Sexually transmitted disease (STD) testing. Diabetes screening. This is done by checking your blood sugar (glucose) after you have not eaten for a while (fasting). You may have this done every 1-3 years. Abdominal aortic aneurysm (AAA) screening. You may need this if you are a current or former smoker. Osteoporosis. You may be screened starting at age 4 if you are at high risk. Talk with your health care provider about your test results, treatment options, and if necessary, the need for more tests. Vaccines  Your health care provider may recommend certain vaccines, such as: Influenza vaccine. This is recommended every year. Tetanus, diphtheria, and acellular pertussis (Tdap, Td) vaccine. You may need a Td booster every 10 years. Zoster vaccine. You may need this after age 3. Pneumococcal 13-valent conjugate (PCV13) vaccine. One dose is recommended after age 20. Pneumococcal polysaccharide (PPSV23) vaccine. One dose is recommended after age 70. Talk to your health  care provider about which screenings and vaccines you need  and how often you need them. This information is not intended to replace advice given to you by your health care provider. Make sure you discuss any questions you have with your health care provider. Document Released: 10/23/2015 Document Revised: 06/15/2016 Document Reviewed: 07/28/2015 Elsevier Interactive Patient Education  2017 [employer] Prevention in the Home Falls can cause injuries. They can happen to people of all ages. There are many things you can do to make your home safe and to help prevent falls. What can I do on the outside of my home? Regularly fix the edges of walkways and driveways and fix any cracks. Remove anything that might make you trip as you walk through a door, such as a raised step or threshold. Trim any bushes or trees on the path to your home. Use bright outdoor lighting. Clear any walking paths of anything that might make someone trip, such as rocks or tools. Regularly check to see if handrails are loose or broken. Make sure that both sides of any steps have handrails. Any raised decks and porches should have guardrails on the edges. Have any leaves, snow, or ice cleared regularly. Use sand or salt on walking paths during winter. Clean up any spills in your garage right away. This includes oil or grease spills. What can I do in the bathroom? Use night lights. Install grab bars by the toilet and in the tub and shower. Do not use towel bars as grab bars. Use non-skid mats or decals in the tub or shower. If you need to sit down in the shower, use a plastic, non-slip stool. Keep the floor dry. Clean up any water that spills on the floor as soon as it happens. Remove soap buildup in the tub or shower regularly. Attach bath mats securely with double-sided non-slip rug tape. Do not have throw rugs and other things on the floor that can make you trip. What can I do in the bedroom? Use night lights. Make sure that you have a light by your bed that is easy  to reach. Do not use any sheets or blankets that are too big for your bed. They should not hang down onto the floor. Have a firm chair that has side arms. You can use this for support while you get dressed. Do not have throw rugs and other things on the floor that can make you trip. What can I do in the kitchen? Clean up any spills right away. Avoid walking on wet floors. Keep items that you use a lot in easy-to-reach places. If you need to reach something above you, use a strong step stool that has a grab bar. Keep electrical cords out of the way. Do not use floor polish or wax that makes floors slippery. If you must use wax, use non-skid floor wax. Do not have throw rugs and other things on the floor that can make you trip. What can I do with my stairs? Do not leave any items on the stairs. Make sure that there are handrails on both sides of the stairs and use them. Fix handrails that are broken or loose. Make sure that handrails are as long as the stairways. Check any carpeting to make sure that it is firmly attached to the stairs. Fix any carpet that is loose or worn. Avoid having throw rugs at the top or bottom of the stairs. If you do have throw rugs, attach them to  the floor with carpet tape. Make sure that you have a light switch at the top of the stairs and the bottom of the stairs. If you do not have them, ask someone to add them for you. What else can I do to help prevent falls? Wear shoes that: Do not have high heels. Have rubber bottoms. Are comfortable and fit you well. Are closed at the toe. Do not wear sandals. If you use a stepladder: Make sure that it is fully opened. Do not climb a closed stepladder. Make sure that both sides of the stepladder are locked into place. Ask someone to hold it for you, if possible. Clearly mark and make sure that you can see: Any grab bars or handrails. First and last steps. Where the edge of each step is. Use tools that help you move  around (mobility aids) if they are needed. These include: Canes. Walkers. Scooters. Crutches. Turn on the lights when you go into a dark area. Replace any light bulbs as soon as they burn out. Set up your furniture so you have a clear path. Avoid moving your furniture around. If any of your floors are uneven, fix them. If there are any pets around you, be aware of where they are. Review your medicines with your doctor. Some medicines can make you feel dizzy. This can increase your chance of falling. Ask your doctor what other things that you can do to help prevent falls. This information is not intended to replace advice given to you by your health care provider. Make sure you discuss any questions you have with your health care provider. Document Released: 07/23/2009 Document Revised: 03/03/2016 Document Reviewed: 10/31/2014 Elsevier Interactive Patient Education  2017 [employer].

## 2022-07-05 NOTE — Progress Notes (Signed)
Virtual Visit via Telephone Note  I connected with  [name] on 07/05/22 at  3:15 PM EDT by telephone and verified that I am speaking with the correct person using two identifiers.  Location: Patient: home Provider: Ether Griffins Persons participating in the virtual visit: [place]   I discussed the limitations, risks, security and privacy concerns of performing an evaluation and management service by telephone and the availability of in person appointments. The patient expressed understanding and agreed to proceed.  Interactive audio and video telecommunications were attempted between this nurse and patient, however failed, due to patient having technical difficulties OR patient did not have access to video capability.  We continued and completed visit with audio only.  Some vital signs may be absent or patient reported.   Dionisio David, LPN  Subjective:   [name] is a 76 y.o. male who presents for Medicare Annual/Subsequent preventive examination.  Review of Systems     Cardiac Risk Factors include: advanced age (>53mn, >>13women);male gender     Objective:    There were no vitals filed for this visit. There is no height or weight on file to calculate BMI.     07/05/2022    2:37 PM 05/09/2022    2:34 PM 07/03/2021    1:22 PM 03/02/2021   10:00 AM 12/24/2019    9:12 AM 08/20/2019    4:00 PM 08/20/2019   10:15 AM  Advanced Directives  Does Patient Have a Medical Advance Directive? No No No No No No Yes  Type of ATransport plannerLiving will  Copy of [place]?       No - copy requested  Would patient like information on creating a medical advance directive? No - Patient declined No - Patient declined Yes (ED - Information included in AVS) Yes (MAU/Ambulatory/Procedural Areas - Information given) Yes (ED - Information included in AVS) No - Patient declined     Current  Medications (verified) Outpatient Encounter Medications as of 07/05/2022  Medication Sig   acetaminophen (TYLENOL) 325 MG tablet Take 2 tablets (650 mg total) by mouth every 6 (six) hours as needed for mild pain (or Fever >/= 101).   ALPRAZolam (XANAX) 0.5 MG tablet Take 1 tablet (0.5 mg total) by mouth daily as needed for anxiety.   Docusate Sodium (COLACE PO) Take by mouth daily.   furosemide (LASIX) 40 MG tablet TAKE 1 TABLET (40 MG TOTAL) BY MOUTH DAILY. TAKE 1/2 TAB FOR 5 DAYS BEFORE TAKING WHOLE TAB.   lisinopril (ZESTRIL) 10 MG tablet Take 1 tablet (10 mg total) by mouth daily.   pantoprazole (PROTONIX) 40 MG tablet TAKE 1 TABLET BY MOUTH EVERY DAY   rosuvastatin (CRESTOR) 20 MG tablet Take 1 tablet (20 mg total) by mouth daily.   Sennosides (SENOKOT PO) Take by mouth daily.   doxycycline (VIBRAMYCIN) 100 MG capsule Take 100 mg by mouth 2 (two) times daily. (Patient not taking: Reported on 07/05/2022)   LORazepam (ATIVAN) 1 MG tablet Take 2 tablets 30 minutes prior to leaving house on day of office surgery.  Bring third tablet with you to office on day of office surgery. (Patient not taking: Reported on 06/16/2022)   rivaroxaban (XARELTO) 20 MG TABS tablet Take 1 tablet (20 mg total) by mouth daily with supper.   No facility-administered encounter medications on file as of 07/05/2022.    Allergies (verified) Patient has no  known allergies.   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   Past Surgical History:  Procedure Laterality Date   CORONARY ANGIOPLASTY WITH STENT PLACEMENT     ENDOVENOUS ABLATION SAPHENOUS VEIN W/ LASER Left 06/02/2022   endovenous laser ablation left greater saphenous vein by Servando Snare MD   [place]   right elbow   KNEE ARTHROSCOPY W/ MENISCAL REPAIR     Family History  Problem Relation Age of Onset   Heart disease Mother 82       heart attack   Lung disease Father        asbestosis   Clotting disorder Father    Asthma Daughter    Hyperlipidemia Daughter    Alcohol abuse Paternal Uncle    Cancer Paternal Grandfather        lung   Social History   Socioeconomic History   Marital status: Married    Spouse name: rose   Number of children: 2   Years of education: 64   Highest education level: Not on file  Occupational History   Occupation: retired    Comment: packing/shipping  Tobacco Use   Smoking status: Former    Packs/day: 0.30    Types: Cigarettes    Start date: 08/31/1966    Quit date: 08/31/1969    Years since quitting: 52.8   Smokeless tobacco: Never  Vaping Use   Vaping Use: Never used  Substance and Sexual Activity   Alcohol use: Yes    Alcohol/week: 3.0 standard drinks of alcohol    Types: 2 Cans of beer, 1 Shots of liquor per week   Drug use: No   Sexual activity: Yes  Other Topics Concern   Not on file  Social History Narrative   Not on file   Social Determinants of Health   Financial Resource Strain: Low Risk  (07/05/2022)   Overall Financial Resource Strain (CARDIA)    Difficulty of Paying Living Expenses: Not hard at all  Food Insecurity: No Food Insecurity (07/05/2022)   Hunger Vital Sign    Worried About Running Out of Food in the Last Year: Never true    Ran Out of Food in the Last Year: Never true  Transportation Needs: No Transportation Needs (07/05/2022)   PRAPARE - Hydrologist (Medical): No    Lack of Transportation (Non-Medical): No  Physical Activity: Insufficiently Active (07/05/2022)   Exercise Vital Sign    Days of Exercise per Week: 5 days    Minutes of Exercise per Session: 20 min  Stress: No Stress Concern Present (07/05/2022)   [place]    Feeling of Stress : Only a little  Social Connections: Moderately Isolated (07/05/2022)   Social Connection and Isolation Panel [NHANES]    Frequency of Communication with Friends and Family: Twice a week    Frequency of Social Gatherings with Friends and Family: Once a week    Attends Religious Services: Never    Marine scientist or Organizations: No    Attends Archivist Meetings: Never    Marital Status: Married  Tobacco Counseling Counseling given: Not Answered   Clinical Intake:  Pre-visit preparation completed: Yes  Pain : No/denies pain     Diabetes: No  How often do you need to have someone help you when you read instructions, pamphlets, or other written materials from your doctor or pharmacy?: 1 - Never  Diabetic?no  Interpreter Needed?: No  Information entered by :: Kirke Shaggy, LPN   Activities of Daily Living    07/05/2022    2:38 PM  In your present state of health, do you have any difficulty performing the following activities:  Hearing? 1  Vision? 0  Difficulty concentrating or making decisions? 0  Walking or climbing stairs? 1  Dressing or bathing? 0  Doing errands, shopping? 0  Preparing Food and eating ? N  Using the Toilet? N  In the past six months, have you accidently leaked urine? N  Do you have problems with loss of bowel control? N  Managing your Medications? N  Managing your Finances? N  Housekeeping or managing your Housekeeping? N    Patient Care Team: Horald Pollen, MD as PCP - General (Internal Medicine) Ahmed Prima, Fransisco Hertz, PA-C as Physician Assistant (Physician Assistant) Register, Luetta Nutting, PA-C as Physician Assistant (Dermatology)  Indicate any recent Medical Services you may have received from other than [facility] providers in the past year (date may be approximate).     Assessment:   This is a routine wellness examination for [name].  Hearing/Vision  screen Hearing Screening - Comments:: Wears aids Vision Screening - Comments:: Wears glasses-   Dietary issues and exercise activities discussed: Current Exercise Habits: Home exercise routine, Type of exercise: walking, Time (Minutes): 30, Frequency (Times/Week): 5, Weekly Exercise (Minutes/Week): 150, Intensity: Mild   Goals Addressed             This Visit's Progress    DIET - EAT MORE FRUITS AND VEGETABLES         Depression Screen    07/05/2022    2:35 PM 03/15/2022    9:00 AM 02/16/2022   11:00 AM 10/08/2021   10:00 AM 08/18/2021   10:53 AM 07/03/2021    1:14 PM 03/02/2021   10:00 AM  PHQ 2/9 Scores  PHQ - 2 Score 0 0 0 0 0 0 0  PHQ- 9 Score 0          Fall Risk    07/05/2022    2:37 PM 04/18/2022    9:45 AM 03/15/2022    9:00 AM 02/16/2022   11:00 AM 10/08/2021   10:00 AM  Fall Risk   Falls in the past year? 0 0 0 0 0  Comment  continues to deny falls x 12 months; continues using cane Continues to deny falls x 12 months; uses cane as/ if indicated    Number falls in past yr: 0 0 0  0  Injury with Fall? 0 0 0  0  Comment   N/A- no falls reported x 12 months  N/A- no falls reported  Risk for fall due to : No Fall Risks Impaired balance/gait;Medication side effect;Other (Comment) Impaired balance/gait;Medication side effect;Other (Comment)  Other (Comment);Medication side effect  Risk for fall due to: Comment  ongoing (R) leg wound; recently diagnosed (L) leg venous insufficiency ongoing (R) leg wound  arthritis  Follow up Falls prevention discussed;Falls evaluation completed  Falls prevention discussed  Falls prevention discussed  Comment     uses cane occasionally    FALL RISK PREVENTION PERTAINING TO THE  HOME:  Any stairs in or around the home? No  If so, are there any without handrails? No  Home free of loose throw rugs in walkways, pet beds, electrical cords, etc? Yes  Adequate lighting in your home to reduce risk of falls? Yes   ASSISTIVE DEVICES UTILIZED TO  PREVENT FALLS:  Life alert? No  Use of a cane, walker or w/c? Yes  Grab bars in the bathroom? Yes  Shower chair or bench in shower? No  Elevated toilet seat or a handicapped toilet? Yes    Cognitive Function:        07/05/2022    2:40 PM 07/03/2021    1:23 PM 12/24/2019    9:08 AM  6CIT Screen  What Year? 0 points 0 points 0 points  What month? 0 points 0 points 0 points  What time? 0 points 0 points 0 points  Count back from 20 0 points 0 points 0 points  Months in reverse 0 points 0 points 0 points  Repeat phrase 2 points 2 points 2 points  Total Score 2 points 2 points 2 points    Immunizations Immunization History  Administered Date(s) Administered   Fluad Quad(high Dose 65+) 06/11/2019, 08/18/2021   Influenza Whole 06/29/2016   Influenza, High Dose Seasonal PF 08/16/2018   Influenza,inj,Quad PF,6+ Mos 06/05/2017   Influenza-Unspecified 07/20/2019, 07/22/2020   Moderna Covid-19 Vaccine Bivalent Booster 35yr & up 04/01/2021   Moderna Sars-Covid-2 Vaccination 12/09/2019, 01/06/2020, 08/08/2020   Pneumococcal Conjugate-13 12/01/2016   Pneumococcal Polysaccharide-23 04/10/2018    TDAP status: Due, Education has been provided regarding the importance of this vaccine. Advised may receive this vaccine at local pharmacy or Health Dept. Aware to provide a copy of the vaccination record if obtained from local pharmacy or Health Dept. Verbalized acceptance and understanding.  Flu Vaccine status: Up to date  Pneumococcal vaccine status: Up to date  Covid-19 vaccine status: Completed vaccines  Qualifies for Shingles Vaccine? Yes   Zostavax completed No   Shingrix Completed?: No.    Education has been provided regarding the importance of this vaccine. Patient has been advised to call insurance company to determine out of pocket expense if they have not yet received this vaccine. Advised may also receive vaccine at local pharmacy or Health Dept. Verbalized acceptance and  understanding.  Screening Tests Health Maintenance  Topic Date Due   Hepatitis C Screening  Never done   TETANUS/TDAP  Never done   Zoster Vaccines- Shingrix (1 of 2) Never done   COVID-19 Vaccine (5 - Moderna risk series) 05/27/2021   INFLUENZA VACCINE  05/10/2022   COLONOSCOPY (Pts 45-476yrInsurance coverage will need to be confirmed)  10/10/2025   Pneumonia Vaccine 6563Years old  Completed   HPV VACCINES  Aged Out    Health Maintenance  Health Maintenance Due  Topic Date Due   Hepatitis C Screening  Never done   TETANUS/TDAP  Never done   Zoster Vaccines- Shingrix (1 of 2) Never done   COVID-19 Vaccine (5 - Moderna risk series) 05/27/2021   INFLUENZA VACCINE  05/10/2022    Colorectal cancer screening: Type of screening: Colonoscopy. Completed 10/11/15. Repeat every 10 years  Lung Cancer Screening: (Low Dose CT Chest recommended if Age 74-80ears, 30 pack-year currently smoking OR have quit w/in 15years.) does not qualify.   Additional Screening:  Hepatitis C Screening: does qualify; Completed no  Vision Screening: Recommended annual ophthalmology exams for early detection of glaucoma and other disorders of the eye.  Is the patient up to date with their annual eye exam?  No  Who is the provider or what is the name of the office in which the patient attends annual eye exams? No one If pt is not established with a provider, would they like to be referred to a provider to establish care? No .   Dental Screening: Recommended annual dental exams for proper oral hygiene  Community Resource Referral / Chronic Care Management: CRR required this visit?  No   CCM required this visit?  No      Plan:     I have personally reviewed and noted the following in the patient's chart:   Medical and social history Use of alcohol, tobacco or illicit drugs  Current medications and supplements including opioid prescriptions. Patient is not currently taking opioid  prescriptions. Functional ability and status Nutritional status Physical activity Advanced directives List of other physicians Hospitalizations, surgeries, and ER visits in previous 12 months Vitals Screenings to include cognitive, depression, and falls Referrals and appointments  In addition, I have reviewed and discussed with patient certain preventive protocols, quality metrics, and best practice recommendations. A written personalized care plan for preventive services as well as general preventive health recommendations were provided to patient.     Dionisio David, LPN   [id number]   Nurse Notes: none

## 2022-08-17 ENCOUNTER — Ambulatory Visit (INDEPENDENT_AMBULATORY_CARE_PROVIDER_SITE_OTHER): Payer: Medicare Other | Admitting: Emergency Medicine

## 2022-08-17 ENCOUNTER — Encounter: Payer: Self-pay | Admitting: Emergency Medicine

## 2022-08-17 VITALS — BP 128/76 | HR 55 | Temp 98.1°F | Ht 74.0 in | Wt 317.5 lb

## 2022-08-17 DIAGNOSIS — R918 Other nonspecific abnormal finding of lung field: Secondary | ICD-10-CM

## 2022-08-17 DIAGNOSIS — I2510 Atherosclerotic heart disease of native coronary artery without angina pectoris: Secondary | ICD-10-CM

## 2022-08-17 DIAGNOSIS — Z86711 Personal history of pulmonary embolism: Secondary | ICD-10-CM

## 2022-08-17 DIAGNOSIS — E785 Hyperlipidemia, unspecified: Secondary | ICD-10-CM | POA: Diagnosis not present

## 2022-08-17 DIAGNOSIS — Z7901 Long term (current) use of anticoagulants: Secondary | ICD-10-CM

## 2022-08-17 DIAGNOSIS — I10 Essential (primary) hypertension: Secondary | ICD-10-CM | POA: Diagnosis not present

## 2022-08-17 DIAGNOSIS — F411 Generalized anxiety disorder: Secondary | ICD-10-CM

## 2022-08-17 DIAGNOSIS — Z23 Encounter for immunization: Secondary | ICD-10-CM | POA: Diagnosis not present

## 2022-08-17 MED ORDER — RIVAROXABAN 20 MG PO TABS: 20.0000 mg | ORAL_TABLET | Freq: Every day | ORAL | 3 refills | Status: DC

## 2022-08-17 MED ORDER — ALPRAZOLAM 0.5 MG PO TABS: 0.5000 mg | ORAL_TABLET | Freq: Every day | ORAL | 1 refills | Status: DC | PRN

## 2022-08-17 NOTE — Assessment & Plan Note (Signed)
Stable.  Diet and nutrition discussed.  Continue rosuvastatin 20 mg daily.  

## 2022-08-17 NOTE — Assessment & Plan Note (Signed)
No bleeding episodes. Fall precautions given.

## 2022-08-17 NOTE — Assessment & Plan Note (Signed)
On Xarelto for lifetime. Continue 20 mg daily

## 2022-08-17 NOTE — Patient Instructions (Signed)
Health Maintenance After Age 74 After age 74, you are at a higher risk for certain long-term diseases and infections as well as injuries from falls. Falls are a major cause of broken bones and head injuries in people who are older than age 74. Getting regular preventive care can help to keep you healthy and well. Preventive care includes getting regular testing and making lifestyle changes as recommended by your health care provider. Talk with your health care provider about: Which screenings and tests you should have. A screening is a test that checks for a disease when you have no symptoms. A diet and exercise plan that is right for you. What should I know about screenings and tests to prevent falls? Screening and testing are the best ways to find a health problem early. Early diagnosis and treatment give you the best chance of managing medical conditions that are common after age 74. Certain conditions and lifestyle choices may make you more likely to have a fall. Your health care provider may recommend: Regular vision checks. Poor vision and conditions such as cataracts can make you more likely to have a fall. If you wear glasses, make sure to get your prescription updated if your vision changes. Medicine review. Work with your health care provider to regularly review all of the medicines you are taking, including over-the-counter medicines. Ask your health care provider about any side effects that may make you more likely to have a fall. Tell your health care provider if any medicines that you take make you feel dizzy or sleepy. Strength and balance checks. Your health care provider may recommend certain tests to check your strength and balance while standing, walking, or changing positions. Foot health exam. Foot pain and numbness, as well as not wearing proper footwear, can make you more likely to have a fall. Screenings, including: Osteoporosis screening. Osteoporosis is a condition that causes  the bones to get weaker and break more easily. Blood pressure screening. Blood pressure changes and medicines to control blood pressure can make you feel dizzy. Depression screening. You may be more likely to have a fall if you have a fear of falling, feel depressed, or feel unable to do activities that you used to do. Alcohol use screening. Using too much alcohol can affect your balance and may make you more likely to have a fall. Follow these instructions at home: Lifestyle Do not drink alcohol if: Your health care provider tells you not to drink. If you drink alcohol: Limit how much you have to: 0-1 drink a day for women. 0-2 drinks a day for men. Know how much alcohol is in your drink. In the [place] one drink equals one 12 oz bottle of beer (355 mL), one 5 oz glass of wine (148 mL), or one 1 oz glass of hard liquor (44 mL). Do not use any products that contain nicotine or tobacco. These products include cigarettes, chewing tobacco, and vaping devices, such as e-cigarettes. If you need help quitting, ask your health care provider. Activity  Follow a regular exercise program to stay fit. This will help you maintain your balance. Ask your health care provider what types of exercise are appropriate for you. If you need a cane or walker, use it as recommended by your health care provider. Wear supportive shoes that have nonskid soles. Safety  Remove any tripping hazards, such as rugs, cords, and clutter. Install safety equipment such as grab bars in bathrooms and safety rails on stairs. Keep rooms and walkways   well-lit. General instructions Talk with your health care provider about your risks for falling. Tell your health care provider if: You fall. Be sure to tell your health care provider about all falls, even ones that seem minor. You feel dizzy, tiredness (fatigue), or off-balance. Take over-the-counter and prescription medicines only as told by your health care provider. These include  supplements. Eat a healthy diet and maintain a healthy weight. A healthy diet includes low-fat dairy products, low-fat (lean) meats, and fiber from whole grains, beans, and lots of fruits and vegetables. Stay current with your vaccines. Schedule regular health, dental, and eye exams. Summary Having a healthy lifestyle and getting preventive care can help to protect your health and wellness after age 74. Screening and testing are the best way to find a health problem early and help you avoid having a fall. Early diagnosis and treatment give you the best chance for managing medical conditions that are more common for people who are older than age 74. Falls are a major cause of broken bones and head injuries in people who are older than age 74. Take precautions to prevent a fall at home. Work with your health care provider to learn what changes you can make to improve your health and wellness and to prevent falls. This information is not intended to replace advice given to you by your health care provider. Make sure you discuss any questions you have with your health care provider. Document Revised: 02/15/2021 Document Reviewed: 02/15/2021 Elsevier Patient Education  2023 [employer]vier Inc[employer]  

## 2022-08-17 NOTE — Assessment & Plan Note (Signed)
Stable.  Takes alprazolam as needed

## 2022-08-17 NOTE — Progress Notes (Signed)
[name] 76 y.o.   Chief Complaint  Patient presents with   Follow-up    35mth f/u appt, no concerns     HISTORY OF PRESENT ILLNESS: This is a 76y.o. male for 658-monthollow-up of chronic medical problems. Doing well.  Has no complaints or medical concerns today. Wt Readings from Last 3 Encounters:  08/17/22 (!) 317 lb 8 oz (144 kg)  07/05/22 (!) 316 lb (143.3 kg)  06/16/22 (!) 316 lb (143.3 kg)     HPI   Prior to Admission medications   Medication Sig Start Date End Date Taking? Authorizing Provider  acetaminophen (TYLENOL) 325 MG tablet Take 2 tablets (650 mg total) by mouth every 6 (six) hours as needed for mild pain (or Fever >/= 101). 08/24/19  Yes Emokpae, Courage, MD  ALPRAZolam (XDuanne Moron0.5 MG tablet Take 1 tablet (0.5 mg total) by mouth daily as needed for anxiety. 08/18/21  Yes Genavieve Mangiapane, MiInes BloomerMD  Docusate Sodium (COLACE PO) Take by mouth daily.   Yes [provider]  furosemide (LASIX) 40 MG tablet TAKE 1 TABLET (40 MG TOTAL) BY MOUTH DAILY. TAKE 1/2 TAB FOR 5 DAYS BEFORE TAKING WHOLE TAB. 01/19/22  Yes Jehiel Koepp, MiInes BloomerMD  lisinopril (ZESTRIL) 10 MG tablet Take 1 tablet (10 mg total) by mouth daily. 08/18/21  Yes SaHorald PollenMD  pantoprazole (PROTONIX) 40 MG tablet TAKE 1 TABLET BY MOUTH EVERY DAY 06/26/22  Yes Ashly Goethe, MiInes BloomerMD  rosuvastatin (CRESTOR) 20 MG tablet Take 1 tablet (20 mg total) by mouth daily. 08/18/21  Yes Grizelda Piscopo, MiInes BloomerMD  Sennosides (SENOKOT PO) Take by mouth daily.   Yes [provider]  rivaroxaban (XARELTO) 20 MG TABS tablet Take 1 tablet (20 mg total) by mouth daily with supper. 08/18/21 03/30/22  SaHorald PollenMD    No Known Allergies  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    [place]   CAD in native artery 08/31/2016   Essential hypertension 08/31/2016   Dyslipidemia 08/31/2016   Family history of heart disease in male family member before age 15461/22/2017   Hearing loss, bilateral 08/31/2016   Generalized anxiety disorder 08/31/2016   History of colonic polyps 08/31/2016    Past Medical History:  Diagnosis Date   Arthritis    CAD S/P percutaneous coronary angioplasty 2011   stents x 3   Generalized anxiety disorder    Hard of hearing    Hyperlipidemia    Hypertension    Lower GI bleed 2014   colonscopy was normal   [place]    Past Surgical History:  Procedure Laterality Date   CORONARY ANGIOPLASTY WITH STENT PLACEMENT     ENDOVENOUS ABLATION SAPHENOUS VEIN W/ LASER Left 06/02/2022   endovenous laser ablation left greater saphenous vein by BrServando SnareD   FRElberta right elbow   KNEE ARTHROSCOPY W/ MENISCAL REPAIR      Social History   Socioeconomic History   Marital status: Married    Spouse name: rose   Number of children: 2   Years of education: 13   Highest education level: Not on file  Occupational History   Occupation: retired    Comment: packing/shipping  Tobacco  Use   Smoking status: Former    Packs/day: 0.30    Types: Cigarettes    Start date: 08/31/1966    Quit date: 08/31/1969    Years since quitting: 52.9   Smokeless tobacco: Never  Vaping Use   Vaping Use: Never used  Substance and Sexual Activity   Alcohol use: Yes    Alcohol/week: 3.0 standard drinks of alcohol    Types: 2 Cans of beer, 1 Shots of liquor per week   Drug use: No   Sexual activity: Yes  Other Topics Concern   Not on file  Social History Narrative   Not on file   Social Determinants of Health   Financial Resource Strain: Low Risk  (07/05/2022)   Overall Financial Resource Strain (CARDIA)    Difficulty of Paying Living Expenses: Not hard at all  Food  Insecurity: No Food Insecurity (07/05/2022)   Hunger Vital Sign    Worried About Running Out of Food in the Last Year: Never true    Ran Out of Food in the Last Year: Never true  Transportation Needs: No Transportation Needs (07/05/2022)   PRAPARE - Hydrologist (Medical): No    Lack of Transportation (Non-Medical): No  Physical Activity: Insufficiently Active (07/05/2022)   Exercise Vital Sign    Days of Exercise per Week: 5 days    Minutes of Exercise per Session: 20 min  Stress: No Stress Concern Present (07/05/2022)   [place]    Feeling of Stress : Only a little  Social Connections: Moderately Isolated (07/05/2022)   Social Connection and Isolation Panel [NHANES]    Frequency of Communication with Friends and Family: Twice a week    Frequency of Social Gatherings with Friends and Family: Once a week    Attends Religious Services: Never    Marine scientist or Organizations: No    Attends Archivist Meetings: Never    Marital Status: Married  Human resources officer Violence: Not At Risk (07/05/2022)   Humiliation, Afraid, Rape, and Kick questionnaire    Fear of Current or Ex-Partner: No    Emotionally Abused: No    Physically Abused: No    Sexually Abused: No    Family History  Problem Relation Age of Onset   Heart disease Mother 40       heart attack   Lung disease Father        asbestosis   Clotting disorder Father    Asthma Daughter    Hyperlipidemia Daughter    Alcohol abuse Paternal Uncle    Cancer Paternal Grandfather        lung     Review of Systems  Constitutional: Negative.  Negative for chills and fever.  HENT: Negative.  Negative for congestion, nosebleeds and sore throat.   Respiratory: Negative.  Negative for cough, hemoptysis and shortness of breath.   Cardiovascular: Negative.  Negative for chest pain and palpitations.  Gastrointestinal: Negative.   Negative for abdominal pain, blood in stool, diarrhea, nausea and vomiting.  Genitourinary: Negative.  Negative for hematuria.  Skin: Negative.  Negative for rash.  Neurological: Negative.  Negative for dizziness and headaches.  All other systems reviewed and are negative.  Today's Vitals   08/17/22 1305  BP: 128/76  Pulse: (!) 55  Temp: 98.1 F (36.7 C)  TempSrc: Oral  SpO2: 98%  Weight: (!) 317 lb 8 oz (144 kg)  Height: '6\' 2"'$  (1.88  m)   Body mass index is 40.76 kg/m.   Physical Exam Vitals reviewed.  Constitutional:      Appearance: Normal appearance.  HENT:     Head: Normocephalic.  Eyes:     Extraocular Movements: Extraocular movements intact.     Pupils: Pupils are equal, round, and reactive to light.  Cardiovascular:     Rate and Rhythm: Normal rate and regular rhythm.     Pulses: Normal pulses.     Heart sounds: Normal heart sounds.  Pulmonary:     Effort: Pulmonary effort is normal.     Breath sounds: Normal breath sounds.  Skin:    General: Skin is warm and dry.  Neurological:     Mental Status: He is alert and oriented to person, place, and time.  Psychiatric:        Mood and Affect: Mood normal.        Behavior: Behavior normal.      ASSESSMENT & PLAN: A total of 44 minutes was spent with the patient and counseling/coordination of care regarding preparing for this visit, review of most recent office visit notes, review of most recent blood work results, review of multiple chronic medical conditions and their management, review of all medications, education on nutrition, prognosis, documentation, and need for follow-up.  Problem List Items Addressed This Visit       Cardiovascular and Mediastinum   Essential hypertension - Primary    Well-controlled hypertension. Continue lisinopril 10 mg daily. BP Readings from Last 3 Encounters:  08/17/22 128/76  06/16/22 122/77  06/02/22 118/76        Relevant Medications   rivaroxaban (XARELTO) 20 MG  TABS tablet     Respiratory   Pulmonary nodules/lesions, multiple     Other   Dyslipidemia    Stable.  Diet and nutrition discussed.  Continue rosuvastatin 20 mg daily.      Generalized anxiety disorder    Stable.  Takes alprazolam as needed      Relevant Medications   ALPRAZolam (XANAX) 0.5 MG tablet   History of pulmonary embolism    On Xarelto for lifetime. Continue 20 mg daily      Relevant Medications   rivaroxaban (XARELTO) 20 MG TABS tablet   Current use of long term anticoagulation    No bleeding episodes. Fall precautions given.      Other Visit Diagnoses     Need for vaccination       Relevant Orders   Flu Vaccine QUAD High Dose(Fluad) (Completed)      Patient Instructions  Health Maintenance After Age 39 After age 37, you are at a higher risk for certain long-term diseases and infections as well as injuries from falls. Falls are a major cause of broken bones and head injuries in people who are older than age 6. Getting regular preventive care can help to keep you healthy and well. Preventive care includes getting regular testing and making lifestyle changes as recommended by your health care provider. Talk with your health care provider about: Which screenings and tests you should have. A screening is a test that checks for a disease when you have no symptoms. A diet and exercise plan that is right for you. What should I know about screenings and tests to prevent falls? Screening and testing are the best ways to find a health problem early. Early diagnosis and treatment give you the best chance of managing medical conditions that are common after age 14. Certain conditions and lifestyle  choices may make you more likely to have a fall. Your health care provider may recommend: Regular vision checks. Poor vision and conditions such as cataracts can make you more likely to have a fall. If you wear glasses, make sure to get your prescription updated if your vision  changes. Medicine review. Work with your health care provider to regularly review all of the medicines you are taking, including over-the-counter medicines. Ask your health care provider about any side effects that may make you more likely to have a fall. Tell your health care provider if any medicines that you take make you feel dizzy or sleepy. Strength and balance checks. Your health care provider may recommend certain tests to check your strength and balance while standing, walking, or changing positions. Foot health exam. Foot pain and numbness, as well as not wearing proper footwear, can make you more likely to have a fall. Screenings, including: Osteoporosis screening. Osteoporosis is a condition that causes the bones to get weaker and break more easily. Blood pressure screening. Blood pressure changes and medicines to control blood pressure can make you feel dizzy. Depression screening. You may be more likely to have a fall if you have a fear of falling, feel depressed, or feel unable to do activities that you used to do. Alcohol use screening. Using too much alcohol can affect your balance and may make you more likely to have a fall. Follow these instructions at home: Lifestyle Do not drink alcohol if: Your health care provider tells you not to drink. If you drink alcohol: Limit how much you have to: 0-1 drink a day for women. 0-2 drinks a day for men. Know how much alcohol is in your drink. In the [place] one drink equals one 12 oz bottle of beer (355 mL), one 5 oz glass of wine (148 mL), or one 1 oz glass of hard liquor (44 mL). Do not use any products that contain nicotine or tobacco. These products include cigarettes, chewing tobacco, and vaping devices, such as e-cigarettes. If you need help quitting, ask your health care provider. Activity  Follow a regular exercise program to stay fit. This will help you maintain your balance. Ask your health care provider what types of exercise  are appropriate for you. If you need a cane or walker, use it as recommended by your health care provider. Wear supportive shoes that have nonskid soles. Safety  Remove any tripping hazards, such as rugs, cords, and clutter. Install safety equipment such as grab bars in bathrooms and safety rails on stairs. Keep rooms and walkways well-lit. General instructions Talk with your health care provider about your risks for falling. Tell your health care provider if: You fall. Be sure to tell your health care provider about all falls, even ones that seem minor. You feel dizzy, tiredness (fatigue), or off-balance. Take over-the-counter and prescription medicines only as told by your health care provider. These include supplements. Eat a healthy diet and maintain a healthy weight. A healthy diet includes low-fat dairy products, low-fat (lean) meats, and fiber from whole grains, beans, and lots of fruits and vegetables. Stay current with your vaccines. Schedule regular health, dental, and eye exams. Summary Having a healthy lifestyle and getting preventive care can help to protect your health and wellness after age 80. Screening and testing are the best way to find a health problem early and help you avoid having a fall. Early diagnosis and treatment give you the best chance for managing medical conditions that are  more common for people who are older than age 56. Falls are a major cause of broken bones and head injuries in people who are older than age 20. Take precautions to prevent a fall at home. Work with your health care provider to learn what changes you can make to improve your health and wellness and to prevent falls. This information is not intended to replace advice given to you by your health care provider. Make sure you discuss any questions you have with your health care provider. Document Revised: 02/15/2021 Document Reviewed: 02/15/2021 Elsevier Patient Education  [place], MD [facility]

## 2022-08-17 NOTE — Assessment & Plan Note (Signed)
Well-controlled hypertension. Continue lisinopril 10 mg daily. BP Readings from Last 3 Encounters:  08/17/22 128/76  06/16/22 122/77  06/02/22 118/76

## 2022-10-09 ENCOUNTER — Other Ambulatory Visit: Payer: Self-pay | Admitting: Emergency Medicine

## 2022-11-12 ENCOUNTER — Other Ambulatory Visit: Payer: Self-pay | Admitting: Emergency Medicine

## 2022-11-12 DIAGNOSIS — Z86711 Personal history of pulmonary embolism: Secondary | ICD-10-CM

## 2022-11-12 DIAGNOSIS — R609 Edema, unspecified: Secondary | ICD-10-CM

## 2023-02-15 ENCOUNTER — Encounter: Payer: Self-pay | Admitting: Emergency Medicine

## 2023-02-15 ENCOUNTER — Ambulatory Visit (INDEPENDENT_AMBULATORY_CARE_PROVIDER_SITE_OTHER): Payer: Medicare Other | Admitting: Emergency Medicine

## 2023-02-15 VITALS — BP 132/72 | HR 53 | Temp 98.1°F | Ht 74.0 in | Wt 317.5 lb

## 2023-02-15 DIAGNOSIS — F411 Generalized anxiety disorder: Secondary | ICD-10-CM | POA: Diagnosis not present

## 2023-02-15 DIAGNOSIS — Z86711 Personal history of pulmonary embolism: Secondary | ICD-10-CM | POA: Diagnosis not present

## 2023-02-15 DIAGNOSIS — Z7901 Long term (current) use of anticoagulants: Secondary | ICD-10-CM | POA: Diagnosis not present

## 2023-02-15 DIAGNOSIS — I2510 Atherosclerotic heart disease of native coronary artery without angina pectoris: Secondary | ICD-10-CM

## 2023-02-15 DIAGNOSIS — E785 Hyperlipidemia, unspecified: Secondary | ICD-10-CM | POA: Diagnosis not present

## 2023-02-15 DIAGNOSIS — R7303 Prediabetes: Secondary | ICD-10-CM | POA: Insufficient documentation

## 2023-02-15 DIAGNOSIS — I10 Essential (primary) hypertension: Secondary | ICD-10-CM | POA: Diagnosis not present

## 2023-02-15 LAB — COMPREHENSIVE METABOLIC PANEL
ALT: 21 U/L (ref 0–53)
AST: 18 U/L (ref 0–37)
Albumin: 4 g/dL (ref 3.5–5.2)
Alkaline Phosphatase: 50 U/L (ref 39–117)
BUN: 26 mg/dL — ABNORMAL HIGH (ref 6–23)
CO2: 26 mEq/L (ref 19–32)
Calcium: 9.4 mg/dL (ref 8.4–10.5)
Chloride: 109 mEq/L (ref 96–112)
Creatinine, Ser: 0.99 mg/dL (ref 0.40–1.50)
GFR: 74.81 mL/min (ref >60.00)
Glucose, Bld: 91 mg/dL (ref 70–99)
Potassium: 4.6 mEq/L (ref 3.5–5.1)
Sodium: 143 mEq/L (ref 135–145)
Total Bilirubin: 0.4 mg/dL (ref 0.2–1.2)
Total Protein: 6.8 g/dL (ref 6.0–8.3)

## 2023-02-15 LAB — LIPID PANEL
Cholesterol: 105 mg/dL (ref 0–200)
HDL: 43.9 mg/dL (ref >39.00)
LDL Cholesterol: 49 mg/dL (ref 0–99)
NonHDL: 61.25
Total CHOL/HDL Ratio: 2
Triglycerides: 60 mg/dL (ref 0.0–149.0)
VLDL: 12 mg/dL (ref 0.0–40.0)

## 2023-02-15 LAB — CBC WITH DIFFERENTIAL/PLATELET
Basophils Absolute: 0 10*3/uL (ref 0.0–0.1)
Basophils Relative: 0.8 % (ref 0.0–3.0)
Eosinophils Absolute: 0.2 10*3/uL (ref 0.0–0.7)
Eosinophils Relative: 3.5 % (ref 0.0–5.0)
HCT: 44 % (ref 39.0–52.0)
Hemoglobin: 14.6 g/dL (ref 13.0–17.0)
Lymphocytes Relative: 26.3 % (ref 12.0–46.0)
Lymphs Abs: 1.7 10*3/uL (ref 0.7–4.0)
MCHC: 33.2 g/dL (ref 30.0–36.0)
MCV: 87.2 fl (ref 78.0–100.0)
Monocytes Absolute: 0.7 10*3/uL (ref 0.1–1.0)
Monocytes Relative: 11.4 % (ref 3.0–12.0)
Neutro Abs: 3.7 10*3/uL (ref 1.4–7.7)
Neutrophils Relative %: 58 % (ref 43.0–77.0)
Platelets: 200 10*3/uL (ref 150.0–400.0)
RBC: 5.04 Mil/uL (ref 4.22–5.81)
RDW: 15.5 % (ref 11.5–15.5)
WBC: 6.3 10*3/uL (ref 4.0–10.5)

## 2023-02-15 LAB — HEMOGLOBIN A1C: Hgb A1c MFr Bld: 7.2 % — ABNORMAL HIGH (ref 4.6–6.5)

## 2023-02-15 NOTE — Progress Notes (Signed)
[name] 77 y.o.   Chief Complaint  Patient presents with   Medical Management of Chronic Issues    f/u appt, bil knee pain    Back Pain    HISTORY OF PRESENT ILLNESS: This is a 77 y.o. male here for 77-month follow-up of chronic medical problems. Complaining of bilateral knee pain and back pain.  Tripped over her puppy 2 days ago and injured back and knees.  Better today. No other complaints or medical concerns today.  Back Pain Pertinent negatives include no abdominal pain, chest pain, fever or headaches.     Prior to Admission medications   Medication Sig Start Date End Date Taking? Authorizing Provider  acetaminophen (TYLENOL) 325 MG tablet Take 2 tablets (650 mg total) by mouth every 6 (six) hours as needed for mild pain (or Fever >/= 101). 08/24/19   Shon Hale, MD  ALPRAZolam Prudy Feeler) 0.5 MG tablet Take 1 tablet (0.5 mg total) by mouth daily as needed for anxiety. 08/17/22   Georgina Quint, MD  Docusate Sodium (COLACE PO) Take by mouth daily.    [provider]  furosemide (LASIX) 40 MG tablet TAKE 1 TABLET (40 MG TOTAL) BY MOUTH DAILY. TAKE 1/2 TAB FOR 5 DAYS BEFORE TAKING WHOLE TAB. 11/12/22   Georgina Quint, MD  lisinopril (ZESTRIL) 10 MG tablet TAKE 1 TABLET BY MOUTH EVERY DAY 10/09/22   Georgina Quint, MD  pantoprazole (PROTONIX) 40 MG tablet TAKE 1 TABLET BY MOUTH EVERY DAY 06/26/22   Georgina Quint, MD  rosuvastatin (CRESTOR) 20 MG tablet TAKE 1 TABLET BY MOUTH EVERY DAY 10/09/22   Georgina Quint, MD  Sennosides (SENOKOT PO) Take by mouth daily.    [provider]  XARELTO 20 MG TABS tablet TAKE 1 TABLET BY MOUTH DAILY WITH SUPPER 11/12/22   Georgina Quint, MD    No Known Allergies  Patient Active Problem List   Diagnosis Date Noted   History of pulmonary embolism 02/25/2020   Current use of long term anticoagulation 02/25/2020   Pulmonary nodules/lesions, multiple 02/25/2020   Malignant  melanoma of back (HCC) 12/01/2016   CAD in native artery 08/31/2016   Essential hypertension 08/31/2016   Dyslipidemia 08/31/2016   Family history of heart disease in male family member before age 26 08/31/2016   Hearing loss, bilateral 08/31/2016   Generalized anxiety disorder 08/31/2016   History of colonic polyps 08/31/2016    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    Past Surgical History:  Procedure Laterality Date   CORONARY ANGIOPLASTY WITH STENT PLACEMENT     ENDOVENOUS ABLATION SAPHENOUS VEIN W/ LASER Left 06/02/2022   endovenous laser ablation left greater saphenous vein by Lemar Livings MD   FRACTURE SURGERY  1995   right elbow   KNEE ARTHROSCOPY W/ MENISCAL REPAIR      Social History   Socioeconomic History   Marital status: Married    Spouse name: rose   Number of children: 2   Years of education: 13   Highest education level: Some college, no degree  Occupational History   Occupation: retired    Comment: packing/shipping  Tobacco Use  Smoking status: Former    Packs/day: .3    Types: Cigarettes    Start date: 08/31/1966    Quit date: 08/31/1969    Years since quitting: 53.4   Smokeless tobacco: Never  Vaping Use   Vaping Use: Never used  Substance and Sexual Activity   Alcohol use: Yes    Alcohol/week: 3.0 standard drinks of alcohol    Types: 2 Cans of beer, 1 Shots of liquor per week   Drug use: No   Sexual activity: Yes  Other Topics Concern   Not on file  Social History Narrative   Not on file   Social Determinants of Health   Financial Resource Strain: Low Risk  (02/11/2023)   Overall Financial Resource Strain (CARDIA)    Difficulty of Paying Living Expenses: Not hard at all  Food  Insecurity: No Food Insecurity (02/11/2023)   Hunger Vital Sign    Worried About Running Out of Food in the Last Year: Never true    Ran Out of Food in the Last Year: Never true  Transportation Needs: No Transportation Needs (02/11/2023)   PRAPARE - Administrator, Civil Service (Medical): No    Lack of Transportation (Non-Medical): No  Physical Activity: Unknown (02/11/2023)   Exercise Vital Sign    Days of Exercise per Week: Patient declined    Minutes of Exercise per Session: 20 min  Stress: No Stress Concern Present (02/11/2023)   [employer] of Occupational Health - Occupational Stress Questionnaire    Feeling of Stress : Only a little  Social Connections: Moderately Isolated (02/11/2023)   Social Connection and Isolation Panel [NHANES]    Frequency of Communication with Friends and Family: Once a week    Frequency of Social Gatherings with Friends and Family: More than three times a week    Attends Religious Services: Never    Database administrator or Organizations: No    Attends Banker Meetings: Never    Marital Status: Married  Catering manager Violence: Not At Risk (07/05/2022)   Humiliation, Afraid, Rape, and Kick questionnaire    Fear of Current or Ex-Partner: No    Emotionally Abused: No    Physically Abused: No    Sexually Abused: No    Family History  Problem Relation Age of Onset   Heart disease Mother 18       heart attack   Lung disease Father        asbestosis   Clotting disorder Father    Asthma Daughter    Hyperlipidemia Daughter    Alcohol abuse Paternal Uncle    Cancer Paternal Grandfather        lung     Review of Systems  Constitutional: Negative.  Negative for chills and fever.  HENT: Negative.  Negative for congestion and sore throat.   Respiratory: Negative.  Negative for cough and shortness of breath.   Cardiovascular: Negative.  Negative for chest pain and palpitations.  Gastrointestinal:  Negative for abdominal  pain, diarrhea, nausea and vomiting.  Musculoskeletal:  Positive for back pain and joint pain (Knee pain).  Skin: Negative.  Negative for rash.  Neurological: Negative.  Negative for dizziness and headaches.    Today's Vitals   02/15/23 1303  BP: 132/72  Pulse: (!) 53  Temp: 98.1 F (36.7 C)  TempSrc: Oral  SpO2: 94%  Weight: (!) 317 lb 8 oz (144 kg)  Height: 6\' 2"  (1.88 m)   Body mass index is 40.76  kg/m.   Physical Exam Vitals reviewed.  Constitutional:      Appearance: Normal appearance. He is obese.  HENT:     Head: Normocephalic.     Mouth/Throat:     Mouth: Mucous membranes are moist.     Pharynx: Oropharynx is clear.  Eyes:     Extraocular Movements: Extraocular movements intact.     Pupils: Pupils are equal, round, and reactive to light.  Cardiovascular:     Rate and Rhythm: Normal rate and regular rhythm.     Pulses: Normal pulses.     Heart sounds: Normal heart sounds.  Pulmonary:     Effort: Pulmonary effort is normal.     Breath sounds: Normal breath sounds.  Abdominal:     Palpations: Abdomen is soft.     Tenderness: There is no abdominal tenderness.  Musculoskeletal:     Cervical back: No tenderness.     Comments: Left knee: Mild swelling.  No tenderness.  No ecchymosis Right knee: Within normal limits Back: No tenderness or muscle spasm.  No spine tenderness  Lymphadenopathy:     Cervical: No cervical adenopathy.  Skin:    General: Skin is warm and dry.  Neurological:     Mental Status: He is alert and oriented to person, place, and time.  Psychiatric:        Mood and Affect: Mood normal.        Behavior: Behavior normal.      ASSESSMENT & PLAN: A total of 42 minutes was spent with the patient and counseling/coordination of care regarding preparing for this visit, review of most recent office visit notes, review of multiple chronic medical conditions under management, review of all medications, education on nutrition, prognosis,  documentation, need for follow-up.  Problem List Items Addressed This Visit       Cardiovascular and Mediastinum   CAD in native artery    No recent anginal episodes On long-term anticoagulation for pulmonary embolism      Essential hypertension - Primary    Well-controlled hypertension Continue lisinopril 10 mg Dietary approaches to stop hypertension discussed Cardiovascular risks associated with hypertension discussed      Relevant Orders   CBC with Differential/Platelet   Comprehensive metabolic panel     Other   Dyslipidemia    Diet and nutrition discussed Continue rosuvastatin 20 mg daily The 10-year ASCVD risk score (Arnett DK, et al., 2019) is: 25.1%   Values used to calculate the score:     Age: 78 years     Sex: Male     Is Non-Hispanic African American: No     Diabetic: No     Tobacco smoker: No     Systolic Blood Pressure: 132 mmHg     Is BP treated: Yes     HDL Cholesterol: 48 mg/dL     Total Cholesterol: 137 mg/dL       Relevant Orders   Comprehensive metabolic panel   Hemoglobin A1c   Lipid panel   Generalized anxiety disorder   History of pulmonary embolism    Stable.  On Xarelto 20 mg for lifetime      Current use of long term anticoagulation    No clinically significant bleeding episodes recently Fall precautions given      Morbid obesity (HCC)    Diet and nutrition discussed Blood work done today Referred to medical weight management clinic Advised to decrease amount of daily carbohydrate intake and daily calories and increase amount of plant-based protein  in his diet      Relevant Orders   CBC with Differential/Platelet   Comprehensive metabolic panel   Hemoglobin A1c   Lipid panel   Amb Ref to Medical Weight Management   Prediabetes    Diet and nutrition discussed. Cardiovascular risks associated with diabetes discussed      Relevant Orders   Hemoglobin A1c   Patient Instructions  Health Maintenance After Age 40 After  age 75, you are at a higher risk for certain long-term diseases and infections as well as injuries from falls. Falls are a major cause of broken bones and head injuries in people who are older than age 72. Getting regular preventive care can help to keep you healthy and well. Preventive care includes getting regular testing and making lifestyle changes as recommended by your health care provider. Talk with your health care provider about: Which screenings and tests you should have. A screening is a test that checks for a disease when you have no symptoms. A diet and exercise plan that is right for you. What should I know about screenings and tests to prevent falls? Screening and testing are the best ways to find a health problem early. Early diagnosis and treatment give you the best chance of managing medical conditions that are common after age 54. Certain conditions and lifestyle choices may make you more likely to have a fall. Your health care provider may recommend: Regular vision checks. Poor vision and conditions such as cataracts can make you more likely to have a fall. If you wear glasses, make sure to get your prescription updated if your vision changes. Medicine review. Work with your health care provider to regularly review all of the medicines you are taking, including over-the-counter medicines. Ask your health care provider about any side effects that may make you more likely to have a fall. Tell your health care provider if any medicines that you take make you feel dizzy or sleepy. Strength and balance checks. Your health care provider may recommend certain tests to check your strength and balance while standing, walking, or changing positions. Foot health exam. Foot pain and numbness, as well as not wearing proper footwear, can make you more likely to have a fall. Screenings, including: Osteoporosis screening. Osteoporosis is a condition that causes the bones to get weaker and break more  easily. Blood pressure screening. Blood pressure changes and medicines to control blood pressure can make you feel dizzy. Depression screening. You may be more likely to have a fall if you have a fear of falling, feel depressed, or feel unable to do activities that you used to do. Alcohol use screening. Using too much alcohol can affect your balance and may make you more likely to have a fall. Follow these instructions at home: Lifestyle Do not drink alcohol if: Your health care provider tells you not to drink. If you drink alcohol: Limit how much you have to: 0-1 drink a day for women. 0-2 drinks a day for men. Know how much alcohol is in your drink. In the [place] one drink equals one 12 oz bottle of beer (355 mL), one 5 oz glass of wine (148 mL), or one 1 oz glass of hard liquor (44 mL). Do not use any products that contain nicotine or tobacco. These products include cigarettes, chewing tobacco, and vaping devices, such as e-cigarettes. If you need help quitting, ask your health care provider. Activity  Follow a regular exercise program to stay fit. This will help you  maintain your balance. Ask your health care provider what types of exercise are appropriate for you. If you need a cane or walker, use it as recommended by your health care provider. Wear supportive shoes that have nonskid soles. Safety  Remove any tripping hazards, such as rugs, cords, and clutter. Install safety equipment such as grab bars in bathrooms and safety rails on stairs. Keep rooms and walkways well-lit. General instructions Talk with your health care provider about your risks for falling. Tell your health care provider if: You fall. Be sure to tell your health care provider about all falls, even ones that seem minor. You feel dizzy, tiredness (fatigue), or off-balance. Take over-the-counter and prescription medicines only as told by your health care provider. These include supplements. Eat a healthy diet and  maintain a healthy weight. A healthy diet includes low-fat dairy products, low-fat (lean) meats, and fiber from whole grains, beans, and lots of fruits and vegetables. Stay current with your vaccines. Schedule regular health, dental, and eye exams. Summary Having a healthy lifestyle and getting preventive care can help to protect your health and wellness after age 7. Screening and testing are the best way to find a health problem early and help you avoid having a fall. Early diagnosis and treatment give you the best chance for managing medical conditions that are more common for people who are older than age 6. Falls are a major cause of broken bones and head injuries in people who are older than age 53. Take precautions to prevent a fall at home. Work with your health care provider to learn what changes you can make to improve your health and wellness and to prevent falls. This information is not intended to replace advice given to you by your health care provider. Make sure you discuss any questions you have with your health care provider. Document Revised: 02/15/2021 Document Reviewed: 02/15/2021 Elsevier Patient Education  2023 Elsevier Inc.     Edwina Barth, MD  Primary Care at [facility]

## 2023-02-15 NOTE — Assessment & Plan Note (Signed)
Diet and nutrition discussed. Cardiovascular risks associated with diabetes discussed

## 2023-02-15 NOTE — Patient Instructions (Signed)
Health Maintenance After Age 75 After age 75, you are at a higher risk for certain long-term diseases and infections as well as injuries from falls. Falls are a major cause of broken bones and head injuries in people who are older than age 75. Getting regular preventive care can help to keep you healthy and well. Preventive care includes getting regular testing and making lifestyle changes as recommended by your health care provider. Talk with your health care provider about: Which screenings and tests you should have. A screening is a test that checks for a disease when you have no symptoms. A diet and exercise plan that is right for you. What should I know about screenings and tests to prevent falls? Screening and testing are the best ways to find a health problem early. Early diagnosis and treatment give you the best chance of managing medical conditions that are common after age 75. Certain conditions and lifestyle choices may make you more likely to have a fall. Your health care provider may recommend: Regular vision checks. Poor vision and conditions such as cataracts can make you more likely to have a fall. If you wear glasses, make sure to get your prescription updated if your vision changes. Medicine review. Work with your health care provider to regularly review all of the medicines you are taking, including over-the-counter medicines. Ask your health care provider about any side effects that may make you more likely to have a fall. Tell your health care provider if any medicines that you take make you feel dizzy or sleepy. Strength and balance checks. Your health care provider may recommend certain tests to check your strength and balance while standing, walking, or changing positions. Foot health exam. Foot pain and numbness, as well as not wearing proper footwear, can make you more likely to have a fall. Screenings, including: Osteoporosis screening. Osteoporosis is a condition that causes  the bones to get weaker and break more easily. Blood pressure screening. Blood pressure changes and medicines to control blood pressure can make you feel dizzy. Depression screening. You may be more likely to have a fall if you have a fear of falling, feel depressed, or feel unable to do activities that you used to do. Alcohol use screening. Using too much alcohol can affect your balance and may make you more likely to have a fall. Follow these instructions at home: Lifestyle Do not drink alcohol if: Your health care provider tells you not to drink. If you drink alcohol: Limit how much you have to: 0-1 drink a day for women. 0-2 drinks a day for men. Know how much alcohol is in your drink. In the [place] one drink equals one 12 oz bottle of beer (355 mL), one 5 oz glass of wine (148 mL), or one 1 oz glass of hard liquor (44 mL). Do not use any products that contain nicotine or tobacco. These products include cigarettes, chewing tobacco, and vaping devices, such as e-cigarettes. If you need help quitting, ask your health care provider. Activity  Follow a regular exercise program to stay fit. This will help you maintain your balance. Ask your health care provider what types of exercise are appropriate for you. If you need a cane or walker, use it as recommended by your health care provider. Wear supportive shoes that have nonskid soles. Safety  Remove any tripping hazards, such as rugs, cords, and clutter. Install safety equipment such as grab bars in bathrooms and safety rails on stairs. Keep rooms and walkways   well-lit. General instructions Talk with your health care provider about your risks for falling. Tell your health care provider if: You fall. Be sure to tell your health care provider about all falls, even ones that seem minor. You feel dizzy, tiredness (fatigue), or off-balance. Take over-the-counter and prescription medicines only as told by your health care provider. These include  supplements. Eat a healthy diet and maintain a healthy weight. A healthy diet includes low-fat dairy products, low-fat (lean) meats, and fiber from whole grains, beans, and lots of fruits and vegetables. Stay current with your vaccines. Schedule regular health, dental, and eye exams. Summary Having a healthy lifestyle and getting preventive care can help to protect your health and wellness after age 75. Screening and testing are the best way to find a health problem early and help you avoid having a fall. Early diagnosis and treatment give you the best chance for managing medical conditions that are more common for people who are older than age 75. Falls are a major cause of broken bones and head injuries in people who are older than age 75. Take precautions to prevent a fall at home. Work with your health care provider to learn what changes you can make to improve your health and wellness and to prevent falls. This information is not intended to replace advice given to you by your health care provider. Make sure you discuss any questions you have with your health care provider. Document Revised: 02/15/2021 Document Reviewed: 02/15/2021 Elsevier Patient Education  2023 Elsevier Inc.  

## 2023-02-15 NOTE — Assessment & Plan Note (Signed)
Stable.  On Xarelto 20 mg for lifetime

## 2023-02-15 NOTE — Assessment & Plan Note (Signed)
No recent anginal episodes On long-term anticoagulation for pulmonary embolism

## 2023-02-15 NOTE — Assessment & Plan Note (Addendum)
Well-controlled hypertension Continue lisinopril 10 mg Dietary approaches to stop hypertension discussed Cardiovascular risks associated with hypertension discussed

## 2023-02-15 NOTE — Assessment & Plan Note (Signed)
No clinically significant bleeding episodes recently Fall precautions given

## 2023-02-15 NOTE — Assessment & Plan Note (Signed)
Diet and nutrition discussed Continue rosuvastatin 20 mg daily The 10-year ASCVD risk score (Arnett DK, et al., 2019) is: 25.1%   Values used to calculate the score:     Age: 77 years     Sex: Male     Is Non-Hispanic African American: No     Diabetic: No     Tobacco smoker: No     Systolic Blood Pressure: 132 mmHg     Is BP treated: Yes     HDL Cholesterol: 48 mg/dL     Total Cholesterol: 137 mg/dL

## 2023-02-15 NOTE — Assessment & Plan Note (Signed)
Diet and nutrition discussed Blood work done today Referred to medical weight management clinic Advised to decrease amount of daily carbohydrate intake and daily calories and increase amount of plant-based protein in his diet

## 2023-02-16 ENCOUNTER — Encounter: Payer: Self-pay | Admitting: Emergency Medicine

## 2023-02-16 ENCOUNTER — Other Ambulatory Visit: Payer: Self-pay | Admitting: Emergency Medicine

## 2023-02-16 MED ORDER — TRAMADOL HCL 50 MG PO TABS: 50.0000 mg | ORAL_TABLET | Freq: Three times a day (TID) | ORAL | 0 refills | Status: AC | PRN

## 2023-02-16 NOTE — Telephone Encounter (Signed)
His A1c is 7.2.  No concerns.  No need for medication.  This is something that he can easily treat with proper nutrition.  Decrease amount of daily carbohydrate intake and daily calories and increase amount of plant based protein in his diet. BUN nothing to worry about. I will prescribe tramadol for pain.  New prescription sent to pharmacy of record today.

## 2023-03-13 ENCOUNTER — Encounter (INDEPENDENT_AMBULATORY_CARE_PROVIDER_SITE_OTHER): Payer: Medicare Other | Admitting: Physician Assistant

## 2023-05-10 ENCOUNTER — Other Ambulatory Visit: Payer: Self-pay | Admitting: Emergency Medicine

## 2023-05-10 DIAGNOSIS — R609 Edema, unspecified: Secondary | ICD-10-CM

## 2023-05-25 ENCOUNTER — Encounter (INDEPENDENT_AMBULATORY_CARE_PROVIDER_SITE_OTHER): Payer: Self-pay

## 2023-07-07 ENCOUNTER — Ambulatory Visit (INDEPENDENT_AMBULATORY_CARE_PROVIDER_SITE_OTHER): Payer: Medicare Other | Admitting: *Deleted

## 2023-07-07 VITALS — Ht 74.0 in | Wt 305.0 lb

## 2023-07-07 DIAGNOSIS — Z0000 Encounter for general adult medical examination without abnormal findings: Secondary | ICD-10-CM

## 2023-07-07 NOTE — Patient Instructions (Addendum)
[name] , Thank you for taking time to come for your Medicare Wellness Visit. I appreciate your ongoing commitment to your health goals. Please review the following plan we discussed and let me know if I can assist you in the future.   These are the goals we discussed:  Goals      Blood Pressure < 140/90     DIET - EAT MORE FRUITS AND VEGETABLES     LDL CALC < 70     Weight (lb) < 200 lb (90.7 kg)     Weight (lb) < 240 lb (108.9 kg)        This is a list of the screening recommended for you and due dates:  Health Maintenance  Topic Date Due   Hepatitis C Screening  Never done   Zoster (Shingles) Vaccine (1 of 2) Never done   Flu Shot  05/11/2023   COVID-19 Vaccine (5 - 2023-24 season) 06/11/2023   Medicare Annual Wellness Visit  07/06/2024   Colon Cancer Screening  10/10/2025   Pneumonia Vaccine  Completed   HPV Vaccine  Aged Out   DTaP/Tdap/Td vaccine  Discontinued     Health Maintenance, Male Adopting a healthy lifestyle and getting preventive care are important in promoting health and wellness. Ask your health care provider about: The right schedule for you to have regular tests and exams. Things you can do on your own to prevent diseases and keep yourself healthy. What should I know about diet, weight, and exercise? Eat a healthy diet  Eat a diet that includes plenty of vegetables, fruits, low-fat dairy products, and lean protein. Do not eat a lot of foods that are high in solid fats, added sugars, or sodium. Maintain a healthy weight Body mass index (BMI) is a measurement that can be used to identify possible weight problems. It estimates body fat based on height and weight. Your health care provider can help determine your BMI and help you achieve or maintain a healthy weight. Get regular exercise Get regular exercise. This is one of the most important things you can do for your health. Most adults should: Exercise for at least 150 minutes each week. The exercise  should increase your heart rate and make you sweat (moderate-intensity exercise). Do strengthening exercises at least twice a week. This is in addition to the moderate-intensity exercise. Spend less time sitting. Even light physical activity can be beneficial. Watch cholesterol and blood lipids Have your blood tested for lipids and cholesterol at 75 years of age, then have this test every 5 years. You may need to have your cholesterol levels checked more often if: Your lipid or cholesterol levels are high. You are older than 75 years of age. You are at high risk for heart disease. What should I know about cancer screening? Many types of cancers can be detected early and may often be prevented. Depending on your health history and family history, you may need to have cancer screening at various ages. This may include screening for: Colorectal cancer. Prostate cancer. Skin cancer. Lung cancer. What should I know about heart disease, diabetes, and high blood pressure? Blood pressure and heart disease High blood pressure causes heart disease and increases the risk of stroke. This is more likely to develop in people who have high blood pressure readings or are overweight. Talk with your health care provider about your target blood pressure readings. Have your blood pressure checked: Every 3-5 years if you are 14-60 years of age. Every  year if you are 103 years old or older. If you are between the ages of 83 and 96 and are a current or former smoker, ask your health care provider if you should have a one-time screening for abdominal aortic aneurysm (AAA). Diabetes Have regular diabetes screenings. This checks your fasting blood sugar level. Have the screening done: Once every three years after age 5 if you are at a normal weight and have a low risk for diabetes. More often and at a younger age if you are overweight or have a high risk for diabetes. What should I know about preventing  infection? Hepatitis B If you have a higher risk for hepatitis B, you should be screened for this virus. Talk with your health care provider to find out if you are at risk for hepatitis B infection. Hepatitis C Blood testing is recommended for: Everyone born from [birth year] through 1963. Anyone with known risk factors for hepatitis C. Sexually transmitted infections (STIs) You should be screened each year for STIs, including gonorrhea and chlamydia, if: You are sexually active and are younger than 75 years of age. You are older than 75 years of age and your health care provider tells you that you are at risk for this type of infection. Your sexual activity has changed since you were last screened, and you are at increased risk for chlamydia or gonorrhea. Ask your health care provider if you are at risk. Ask your health care provider about whether you are at high risk for HIV. Your health care provider may recommend a prescription medicine to help prevent HIV infection. If you choose to take medicine to prevent HIV, you should first get tested for HIV. You should then be tested every 3 months for as long as you are taking the medicine. Follow these instructions at home: Alcohol use Do not drink alcohol if your health care provider tells you not to drink. If you drink alcohol: Limit how much you have to 0-2 drinks a day. Know how much alcohol is in your drink. In the [place] one drink equals one 12 oz bottle of beer (355 mL), one 5 oz glass of wine (148 mL), or one 1 oz glass of hard liquor (44 mL). Lifestyle Do not use any products that contain nicotine or tobacco. These products include cigarettes, chewing tobacco, and vaping devices, such as e-cigarettes. If you need help quitting, ask your health care provider. Do not use street drugs. Do not share needles. Ask your health care provider for help if you need support or information about quitting drugs. General instructions Schedule regular health,  dental, and eye exams. Stay current with your vaccines. Tell your health care provider if: You often feel depressed. You have ever been abused or do not feel safe at home. Summary Adopting a healthy lifestyle and getting preventive care are important in promoting health and wellness. Follow your health care provider's instructions about healthy diet, exercising, and getting tested or screened for diseases. Follow your health care provider's instructions on monitoring your cholesterol and blood pressure. This information is not intended to replace advice given to you by your health care provider. Make sure you discuss any questions you have with your health care provider. Document Revised: 02/15/2021 Document Reviewed: 02/15/2021 Elsevier Patient Education  2024 [employer].

## 2023-07-07 NOTE — Progress Notes (Signed)
Subjective:   [name] is a 77 y.o. male who presents for Medicare Annual/Subsequent preventive examination.  Visit Complete: Virtual  I connected with  Therese Sarah on 07/07/23 by a audio enabled telemedicine application and verified that I am speaking with the correct person using two identifiers.  Patient Location: Home  Provider Location: Office/Clinic  I discussed the limitations of evaluation and management by telemedicine. The patient expressed understanding and agreed to proceed.  Patient Medicare AWV questionnaire was completed by the patient on 07/03/2023; I have confirmed that all information answered by patient is correct and no changes since this date.  Cardiac Risk Factors include: advanced age (>69men, >69 women);obesity (BMI >30kg/m2);sedentary lifestyle;male gender;hypertension    Please see problem list for additional risk factors  Vital Signs: Because this visit was a virtual/telehealth visit, some criteria may be missing or patient reported. Any vitals not documented were not able to be obtained and vitals that have been documented are patient reported.   Objective:    Today's Vitals   07/07/23 0811  Weight: (!) 305 lb (138.3 kg)  Height: 6\' 2"  (1.88 m)   Body mass index is 39.16 kg/m.     07/07/2023    8:20 AM 07/05/2022    2:37 PM 05/09/2022    2:34 PM 07/03/2021    1:22 PM 03/02/2021   10:00 AM 12/24/2019    9:12 AM 08/20/2019    4:00 PM  Advanced Directives  Does Patient Have a Medical Advance Directive?  No No No No No No  Would patient like information on creating a medical advance directive? No - Patient declined No - Patient declined No - Patient declined Yes (ED - Information included in AVS) Yes (MAU/Ambulatory/Procedural Areas - Information given) Yes (ED - Information included in AVS) No - Patient declined    Current Medications (verified) Outpatient Encounter Medications as of 07/07/2023  Medication Sig   acetaminophen (TYLENOL) 325  MG tablet Take 2 tablets (650 mg total) by mouth every 6 (six) hours as needed for mild pain (or Fever >/= 101).   ALPRAZolam (XANAX) 0.5 MG tablet Take 1 tablet (0.5 mg total) by mouth daily as needed for anxiety.   Docusate Sodium (COLACE PO) Take by mouth daily.   furosemide (LASIX) 40 MG tablet TAKE 1/2 TABLET DAILY FOR 5 DAYS, THEN TAKE 1 TABLET DAILY THEREAFTER   lisinopril (ZESTRIL) 10 MG tablet TAKE 1 TABLET BY MOUTH EVERY DAY   pantoprazole (PROTONIX) 40 MG tablet TAKE 1 TABLET BY MOUTH EVERY DAY   rosuvastatin (CRESTOR) 20 MG tablet TAKE 1 TABLET BY MOUTH EVERY DAY   Sennosides (SENOKOT PO) Take by mouth daily.   XARELTO 20 MG TABS tablet TAKE 1 TABLET BY MOUTH DAILY WITH SUPPER   No facility-administered encounter medications on file as of 07/07/2023.    Allergies (verified) Patient has no known allergies.   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   Past Surgical History:  Procedure Laterality Date   CORONARY ANGIOPLASTY WITH STENT PLACEMENT     ENDOVENOUS ABLATION SAPHENOUS VEIN W/ LASER Left 06/02/2022   endovenous laser ablation left greater saphenous vein by Lemar Livings MD  FRACTURE SURGERY  1995   right elbow   KNEE ARTHROSCOPY W/ MENISCAL REPAIR     Family History  Problem Relation Age of Onset   Heart disease Mother 26       heart attack   Lung disease Father        asbestosis   Clotting disorder Father    Asthma Daughter    Hyperlipidemia Daughter    Alcohol abuse Paternal Uncle    Cancer Paternal Grandfather        lung   Social History   Socioeconomic History   Marital status: Married    Spouse name: rose   Number of children: 2   Years of education: 13   Highest education level:  Some college, no degree  Occupational History   Occupation: retired    Comment: packing/shipping  Tobacco Use   Smoking status: Former    Current packs/day: 0.00    Average packs/day: 0.3 packs/day for 3.0 years (0.9 ttl pk-yrs)    Types: Cigarettes    Start date: 08/31/1966    Quit date: 08/31/1969    Years since quitting: 53.8   Smokeless tobacco: Never  Vaping Use   Vaping status: Never Used  Substance and Sexual Activity   Alcohol use: Yes    Alcohol/week: 3.0 standard drinks of alcohol    Types: 2 Cans of beer, 1 Shots of liquor per week    Comment: occassionally   Drug use: No   Sexual activity: Yes  Other Topics Concern   Not on file  Social History Narrative   Not on file   Social Determinants of Health   Financial Resource Strain: Low Risk  (07/07/2023)   Overall Financial Resource Strain (CARDIA)    Difficulty of Paying Living Expenses: Not hard at all  Food Insecurity: No Food Insecurity (07/07/2023)   Hunger Vital Sign    Worried About Running Out of Food in the Last Year: Never true    Ran Out of Food in the Last Year: Never true  Transportation Needs: No Transportation Needs (07/07/2023)   PRAPARE - Administrator, Civil Service (Medical): No    Lack of Transportation (Non-Medical): No  Physical Activity: Inactive (07/07/2023)   Exercise Vital Sign    Days of Exercise per Week: 0 days    Minutes of Exercise per Session: 0 min  Stress: No Stress Concern Present (07/07/2023)   [employer] of Occupational Health - Occupational Stress Questionnaire    Feeling of Stress : Not at all  Social Connections: Moderately Isolated (07/07/2023)   Social Connection and Isolation Panel [NHANES]    Frequency of Communication with Friends and Family: Once a week    Frequency of Social Gatherings with Friends and Family: More than three times a week    Attends Religious Services: Never    Database administrator or Organizations: No    Attends Museum/gallery exhibitions officer: Never    Marital Status: Married    Tobacco Counseling Counseling given: Not Answered   Clinical Intake:  Pre-visit preparation completed: Yes  Pain : No/denies pain     Nutritional Status: BMI > 30  Obese Nutritional Risks: None Diabetes: No  How often do you need to have someone help you when you read instructions, pamphlets, or other written materials from your doctor or pharmacy?: 1 - Never What is the last grade level you completed in school?: some college  Interpreter Needed?: No      Activities  of Daily Living    07/07/2023    8:22 AM 07/03/2023    2:47 PM  In your present state of health, do you have any difficulty performing the following activities:  Hearing? 1 1  Vision? 0 0  Difficulty concentrating or making decisions? 0 0  Walking or climbing stairs? 0 1  Dressing or bathing? 0 0  Doing errands, shopping? 0 0  Preparing Food and eating ? N N  Using the Toilet? N N  In the past six months, have you accidently leaked urine? N N  Do you have problems with loss of bowel control? N N  Managing your Medications? N N  Managing your Finances? N N  Housekeeping or managing your Housekeeping? N N    Patient Care Team: Georgina Quint, MD as PCP - General (Internal Medicine) [place] Ouch, Lennart Pall, PA-C as Physician Assistant (Physician Assistant) Register, Joice Lofts, PA-C as Physician Assistant (Dermatology)  Indicate any recent Medical Services you may have received from other than [facility] providers in the past year (date may be approximate).     Assessment:   This is a routine wellness examination for [place].  Hearing/Vision screen No results found.   Goals Addressed   None   Depression Screen    07/07/2023    8:19 AM 02/15/2023    1:04 PM 08/17/2022    1:11 PM 07/05/2022    2:35 PM 03/15/2022    9:00 AM 02/16/2022   11:00 AM 10/08/2021   10:00 AM  PHQ 2/9 Scores  PHQ - 2 Score 0 0 0 0 0 0 0  PHQ- 9 Score    0       Fall  Risk    07/07/2023    8:21 AM 07/03/2023    2:47 PM 02/15/2023    1:04 PM 08/17/2022    1:11 PM 07/05/2022    2:37 PM  Fall Risk   Falls in the past year? 1 1 1  0 0  Number falls in past yr: 1 0 0 0 0  Injury with Fall? 0 0 0 0 0  Risk for fall due to : No Fall Risks  No Fall Risks No Fall Risks No Fall Risks  Follow up Falls evaluation completed  Falls evaluation completed Falls evaluation completed Falls prevention discussed;Falls evaluation completed    MEDICARE RISK AT HOME: Medicare Risk at Home Any stairs in or around the home?: Yes If so, are there any without handrails?: Yes Home free of loose throw rugs in walkways, pet beds, electrical cords, etc?: Yes Adequate lighting in your home to reduce risk of falls?: Yes Life alert?: Yes Use of a cane, walker or w/c?: No Grab bars in the bathroom?: Yes Shower chair or bench in shower?: Yes Elevated toilet seat or a handicapped toilet?: Yes  TIMED UP AND GO:  Was the test performed?  No    Cognitive Function:        07/07/2023    8:32 AM 07/05/2022    2:40 PM 07/03/2021    1:23 PM 12/24/2019    9:08 AM  6CIT Screen  What Year? 0 points 0 points 0 points 0 points  What month? 0 points 0 points 0 points 0 points  What time? 0 points 0 points 0 points 0 points  Count back from 20 0 points 0 points 0 points 0 points  Months in reverse 0 points 0 points 0 points 0 points  Repeat phrase 2 points 2 points 2 points 2  points  Total Score 2 points 2 points 2 points 2 points    Immunizations Immunization History  Administered Date(s) Administered   Fluad Quad(high Dose 65+) 06/11/2019, 08/18/2021, 08/17/2022   Influenza Whole 06/29/2016   Influenza, High Dose Seasonal PF 08/16/2018   Influenza,inj,Quad PF,6+ Mos 06/05/2017   Influenza-Unspecified 07/20/2019, 07/22/2020   Moderna Covid-19 Vaccine Bivalent Booster 18yrs & up 04/01/2021   Moderna Sars-Covid-2 Vaccination 12/09/2019, 01/06/2020, 08/08/2020   Pneumococcal  Conjugate-13 12/01/2016   Pneumococcal Polysaccharide-23 04/10/2018    TDAP status: Due, Education has been provided regarding the importance of this vaccine. Advised may receive this vaccine at local pharmacy or Health Dept. Aware to provide a copy of the vaccination record if obtained from local pharmacy or Health Dept. Verbalized acceptance and understanding.  Flu Vaccine status: Due, Education has been provided regarding the importance of this vaccine. Advised may receive this vaccine at local pharmacy or Health Dept. Aware to provide a copy of the vaccination record if obtained from local pharmacy or Health Dept. Verbalized acceptance and understanding.  Pneumococcal vaccine status: Up to date  Covid-19 vaccine status: Completed vaccines  Qualifies for Shingles Vaccine? Yes   Zostavax completed No   Shingrix Completed?: No.    Education has been provided regarding the importance of this vaccine. Patient has been advised to call insurance company to determine out of pocket expense if they have not yet received this vaccine. Advised may also receive vaccine at local pharmacy or Health Dept. Verbalized acceptance and understanding.  Screening Tests Health Maintenance  Topic Date Due   Hepatitis C Screening  Never done   Zoster Vaccines- Shingrix (1 of 2) Never done   INFLUENZA VACCINE  05/11/2023   COVID-19 Vaccine (5 - 2023-24 season) 06/11/2023   Medicare Annual Wellness (AWV)  07/06/2024   Colonoscopy  10/10/2025   Pneumonia Vaccine 52+ Years old  Completed   HPV VACCINES  Aged Out   DTaP/Tdap/Td  Discontinued    Health Maintenance  Health Maintenance Due  Topic Date Due   Hepatitis C Screening  Never done   Zoster Vaccines- Shingrix (1 of 2) Never done   INFLUENZA VACCINE  05/11/2023   COVID-19 Vaccine (5 - 2023-24 season) 06/11/2023    Colorectal cancer screening: Type of screening: Colonoscopy. Completed 10/11/2015. Repeat every 10 years  Lung Cancer Screening: (Low  Dose CT Chest recommended if Age 72-80 years, 20 pack-year currently smoking OR have quit w/in 15years.) does not qualify.   Lung Cancer Screening Referral: n/a  Additional Screening:  Hepatitis C Screening: does qualify; Completed n/a  Vision Screening: Recommended annual ophthalmology exams for early detection of glaucoma and other disorders of the eye. Is the patient up to date with their annual eye exam?  No  Who is the provider or what is the name of the office in which the patient attends annual eye exams? Patient states he will find a eye doctor and make an appointment If pt is not established with a provider, would they like to be referred to a provider to establish care? No .   Dental Screening: Recommended annual dental exams for proper oral hygiene  Diabetic Foot Exam: patient is not a diabetic  Community Resource Referral / Chronic Care Management: CRR required this visit?  No   CCM required this visit?  No     Plan:     I have personally reviewed and noted the following in the patient's chart:   Medical and social history Use of alcohol,  tobacco or illicit drugs  Current medications and supplements including opioid prescriptions. Patient is not currently taking opioid prescriptions. Functional ability and status Nutritional status Physical activity Advanced directives List of other physicians Hospitalizations, surgeries, and ER visits in previous 12 months Vitals Screenings to include cognitive, depression, and falls Referrals and appointments  In addition, I have reviewed and discussed with patient certain preventive protocols, quality metrics, and best practice recommendations. A written personalized care plan for preventive services as well as general preventive health recommendations were provided to patient.     Tamela Oddi, CMA   07/07/2023   Non face to face 35 minutes  After Visit Summary: (MyChart) Due to this being a telephonic visit,  the after visit summary with patients personalized plan was offered to patient via MyChart   Nurse Notes:  Mr. Lamb , Thank you for taking time to come for your Medicare Wellness Visit. I appreciate your ongoing commitment to your health goals. Please review the following plan we discussed and let me know if I can assist you in the future.   These are the goals we discussed:  Goals      Blood Pressure < 140/90     DIET - EAT MORE FRUITS AND VEGETABLES     LDL CALC < 70     Weight (lb) < 200 lb (90.7 kg)     Weight (lb) < 240 lb (108.9 kg)        This is a list of the screening recommended for you and due dates:  Health Maintenance  Topic Date Due   Hepatitis C Screening  Never done   Zoster (Shingles) Vaccine (1 of 2) Never done   Flu Shot  05/11/2023   COVID-19 Vaccine (5 - 2023-24 season) 06/11/2023   Medicare Annual Wellness Visit  07/06/2024   Colon Cancer Screening  10/10/2025   Pneumonia Vaccine  Completed   HPV Vaccine  Aged Out   DTaP/Tdap/Td vaccine  Discontinued

## 2023-07-09 ENCOUNTER — Other Ambulatory Visit: Payer: Self-pay | Admitting: Emergency Medicine

## 2023-07-24 ENCOUNTER — Other Ambulatory Visit: Payer: Self-pay | Admitting: Emergency Medicine

## 2023-07-24 DIAGNOSIS — Z86711 Personal history of pulmonary embolism: Secondary | ICD-10-CM

## 2023-08-01 ENCOUNTER — Other Ambulatory Visit: Payer: Self-pay | Admitting: Emergency Medicine

## 2023-08-17 ENCOUNTER — Encounter: Payer: Self-pay | Admitting: Emergency Medicine

## 2023-08-17 ENCOUNTER — Ambulatory Visit: Payer: Medicare Other | Admitting: Emergency Medicine

## 2023-08-17 VITALS — BP 124/80 | HR 67 | Temp 98.2°F | Ht 74.0 in | Wt 316.2 lb

## 2023-08-17 DIAGNOSIS — E785 Hyperlipidemia, unspecified: Secondary | ICD-10-CM

## 2023-08-17 DIAGNOSIS — I10 Essential (primary) hypertension: Secondary | ICD-10-CM

## 2023-08-17 DIAGNOSIS — Z23 Encounter for immunization: Secondary | ICD-10-CM

## 2023-08-17 DIAGNOSIS — Z7901 Long term (current) use of anticoagulants: Secondary | ICD-10-CM | POA: Diagnosis not present

## 2023-08-17 DIAGNOSIS — R7303 Prediabetes: Secondary | ICD-10-CM | POA: Diagnosis not present

## 2023-08-17 DIAGNOSIS — I2510 Atherosclerotic heart disease of native coronary artery without angina pectoris: Secondary | ICD-10-CM | POA: Diagnosis not present

## 2023-08-17 DIAGNOSIS — Z6841 Body Mass Index (BMI) 40.0 and over, adult: Secondary | ICD-10-CM

## 2023-08-17 DIAGNOSIS — F411 Generalized anxiety disorder: Secondary | ICD-10-CM

## 2023-08-17 MED ORDER — ALPRAZOLAM 0.5 MG PO TABS: 0.5000 mg | ORAL_TABLET | Freq: Every day | ORAL | 1 refills | Status: DC | PRN

## 2023-08-17 MED ORDER — TRAMADOL HCL 50 MG PO TABS
50.0000 mg | ORAL_TABLET | Freq: Three times a day (TID) | ORAL | 1 refills | Status: AC | PRN
Start: 2023-08-17 — End: 2023-08-22

## 2023-08-17 NOTE — Assessment & Plan Note (Signed)
Diet and nutrition discussed.  Advised to decrease amount of daily carbohydrate intake and daily calories and increase amount of plant based protein in his diet 

## 2023-08-17 NOTE — Assessment & Plan Note (Signed)
Diet and nutrition discussed Continue rosuvastatin 20 mg daily The 10-year ASCVD risk score (Arnett DK, et al., 2019) is: 25.1%   Values used to calculate the score:     Age: 77 years     Sex: Male     Is Non-Hispanic African American: No     Diabetic: No     Tobacco smoker: No     Systolic Blood Pressure: 132 mmHg     Is BP treated: Yes     HDL Cholesterol: 48 mg/dL     Total Cholesterol: 137 mg/dL

## 2023-08-17 NOTE — Assessment & Plan Note (Signed)
No clinically significant bleeding episodes recently Fall precautions given

## 2023-08-17 NOTE — Progress Notes (Signed)
[name] 77 y.o.   Chief Complaint  Patient presents with   Medical Management of Chronic Issues    69month f/u for HTN. Patient wanting refills for Alprazolam and tram    HISTORY OF PRESENT ILLNESS: This is a 77 y.o. male here for 7-month follow-up of chronic medical conditions. Overall doing well.  Has no complaints or medical concerns today. BP Readings from Last 3 Encounters:  02/15/23 132/72  08/17/22 128/76  06/16/22 122/77   Wt Readings from Last 3 Encounters:  08/17/23 (!) 316 lb 3.2 oz (143.4 kg)  07/07/23 (!) 305 lb (138.3 kg)  02/15/23 (!) 317 lb 8 oz (144 kg)     HPI   Prior to Admission medications   Medication Sig Start Date End Date Taking? Authorizing Provider  acetaminophen (TYLENOL) 325 MG tablet Take 2 tablets (650 mg total) by mouth every 6 (six) hours as needed for mild pain (or Fever >/= 101). 08/24/19   Shon Hale, MD  ALPRAZolam Prudy Feeler) 0.5 MG tablet Take 1 tablet (0.5 mg total) by mouth daily as needed for anxiety. 08/17/22   Georgina Quint, MD  Docusate Sodium (COLACE PO) Take by mouth daily.    [provider]  furosemide (LASIX) 40 MG tablet TAKE 1/2 TABLET DAILY FOR 5 DAYS, THEN TAKE 1 TABLET DAILY THEREAFTER 05/10/23   Georgina Quint, MD  lisinopril (ZESTRIL) 10 MG tablet TAKE 1 TABLET BY MOUTH EVERY DAY 08/01/23   Georgina Quint, MD  pantoprazole (PROTONIX) 40 MG tablet TAKE 1 TABLET BY MOUTH EVERY DAY 07/09/23   Georgina Quint, MD  rosuvastatin (CRESTOR) 20 MG tablet TAKE 1 TABLET BY MOUTH EVERY DAY 10/09/22   Georgina Quint, MD  Sennosides (SENOKOT PO) Take by mouth daily.    [provider]  XARELTO 20 MG TABS tablet TAKE 1 TABLET BY MOUTH DAILY WITH SUPPER 07/24/23   Georgina Quint, MD    No Known Allergies  Patient Active Problem List   Diagnosis Date Noted   Morbid obesity (HCC) 02/15/2023   Prediabetes 02/15/2023   History of pulmonary embolism 02/25/2020   Current  use of long term anticoagulation 02/25/2020   Pulmonary nodules/lesions, multiple 02/25/2020   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   CAD in native artery 08/31/2016   Essential hypertension 08/31/2016   Dyslipidemia 08/31/2016   Family history of heart disease in male family member before age 36 08/31/2016   Hearing loss, bilateral 08/31/2016   Generalized anxiety disorder 08/31/2016   History of colonic polyps 08/31/2016    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    Past Surgical History:  Procedure Laterality Date   CORONARY ANGIOPLASTY WITH STENT PLACEMENT     ENDOVENOUS ABLATION SAPHENOUS VEIN W/ LASER Left 06/02/2022   endovenous laser ablation left greater saphenous vein by Lemar Livings MD   FRACTURE SURGERY  1995   right elbow   KNEE ARTHROSCOPY W/ MENISCAL REPAIR      Social History   Socioeconomic History   Marital status: Married    Spouse name: rose   Number of children: 2   Years of education: 13   Highest education level:  Some college, no degree  Occupational History   Occupation: retired    Comment: packing/shipping  Tobacco Use   Smoking status: Former    Current packs/day: 0.00    Average packs/day: 0.3 packs/day for 3.0 years (0.9 ttl pk-yrs)    Types: Cigarettes    Start date: 08/31/1966    Quit date: 08/31/1969    Years since quitting: 53.9   Smokeless tobacco: Never  Vaping Use   Vaping status: Never Used  Substance and Sexual Activity   Alcohol use: Yes    Alcohol/week: 3.0 standard drinks of alcohol    Types: 2 Cans of beer, 1 Shots of liquor per week    Comment: occassionally   Drug use: No   Sexual activity: Yes  Other Topics Concern   Not on file  Social History Narrative    Not on file   Social Determinants of Health   Financial Resource Strain: Low Risk  (08/13/2023)   Overall Financial Resource Strain (CARDIA)    Difficulty of Paying Living Expenses: Not hard at all  Food Insecurity: No Food Insecurity (08/13/2023)   Hunger Vital Sign    Worried About Running Out of Food in the Last Year: Never true    Ran Out of Food in the Last Year: Never true  Transportation Needs: No Transportation Needs (08/13/2023)   PRAPARE - Administrator, Civil Service (Medical): No    Lack of Transportation (Non-Medical): No  Physical Activity: Unknown (08/13/2023)   Exercise Vital Sign    Days of Exercise per Week: Patient declined    Minutes of Exercise per Session: 0 min  Recent Concern: Physical Activity - Inactive (07/07/2023)   Exercise Vital Sign    Days of Exercise per Week: 0 days    Minutes of Exercise per Session: 0 min  Stress: No Stress Concern Present (08/13/2023)   [employer] of Occupational Health - Occupational Stress Questionnaire    Feeling of Stress : Only a little  Social Connections: Unknown (08/13/2023)   Social Connection and Isolation Panel [NHANES]    Frequency of Communication with Friends and Family: Once a week    Frequency of Social Gatherings with Friends and Family: Patient declined    Attends Religious Services: Never    Database administrator or Organizations: No    Attends Engineer, structural: Never    Marital Status: Married  Recent Concern: Social Connections - Moderately Isolated (07/07/2023)   Social Connection and Isolation Panel [NHANES]    Frequency of Communication with Friends and Family: Once a week    Frequency of Social Gatherings with Friends and Family: More than three times a week    Attends Religious Services: Never    Database administrator or Organizations: No    Attends Banker Meetings: Never    Marital Status: Married  Catering manager Violence: Not At Risk (07/07/2023)    Humiliation, Afraid, Rape, and Kick questionnaire    Fear of Current or Ex-Partner: No    Emotionally Abused: No    Physically Abused: No    Sexually Abused: No    Family History  Problem Relation Age of Onset   Heart disease Mother 62       heart attack   Lung disease Father        asbestosis   Clotting disorder Father    Asthma Daughter    Hyperlipidemia Daughter    Alcohol abuse Paternal Uncle    Cancer Paternal  Grandfather        lung     Review of Systems  Constitutional: Negative.  Negative for chills and fever.  HENT: Negative.  Negative for congestion and sore throat.   Respiratory: Negative.  Negative for cough and shortness of breath.   Cardiovascular: Negative.  Negative for chest pain and palpitations.  Gastrointestinal: Negative.  Negative for abdominal pain, nausea and vomiting.  Genitourinary: Negative.  Negative for dysuria and hematuria.  Musculoskeletal:  Positive for back pain and joint pain.  Skin: Negative.  Negative for rash.  Neurological: Negative.  Negative for dizziness and headaches.  All other systems reviewed and are negative.   Today's Vitals   08/17/23 1259  BP: 124/80  Pulse: 67  Temp: 98.2 F (36.8 C)  TempSrc: Oral  SpO2: 97%  Weight: (!) 316 lb 3.2 oz (143.4 kg)  Height: 6\' 2"  (1.88 m)   Body mass index is 40.6 kg/m.   Physical Exam Vitals reviewed.  Constitutional:      Appearance: Normal appearance.  Eyes:     Extraocular Movements: Extraocular movements intact.  Cardiovascular:     Rate and Rhythm: Normal rate.  Pulmonary:     Effort: Pulmonary effort is normal.  Skin:    General: Skin is warm and dry.  Neurological:     Mental Status: He is alert and oriented to person, place, and time.  Psychiatric:        Mood and Affect: Mood normal.        Behavior: Behavior normal.      ASSESSMENT & PLAN: A total of 46 minutes was spent with the patient and counseling/coordination of care regarding preparing for this  visit, review of most recent office visit notes, review of multiple chronic medical conditions under management, review of all medications, review of most recent blood work results, cardiovascular risks associated with hypertension and dyslipidemia, education on nutrition, prognosis, documentation and need for follow-up.  Problem List Items Addressed This Visit       Cardiovascular and Mediastinum   CAD in native artery    No recent anginal episodes On long-term anticoagulation for pulmonary embolism      Essential hypertension - Primary    Well-controlled hypertension Continue lisinopril 10 mg Dietary approaches to stop hypertension discussed Cardiovascular risks associated with hypertension discussed        Other   Dyslipidemia    Diet and nutrition discussed Continue rosuvastatin 20 mg daily The 10-year ASCVD risk score (Arnett DK, et al., 2019) is: 25.1%   Values used to calculate the score:     Age: 18 years     Sex: Male     Is Non-Hispanic African American: No     Diabetic: No     Tobacco smoker: No     Systolic Blood Pressure: 132 mmHg     Is BP treated: Yes     HDL Cholesterol: 48 mg/dL     Total Cholesterol: 137 mg/dL      Generalized anxiety disorder    Stable.  Takes alprazolam as needed      Relevant Medications   ALPRAZolam (XANAX) 0.5 MG tablet   Current use of long term anticoagulation    No clinically significant bleeding episodes recently Fall precautions given      Morbid obesity (HCC)    Diet and nutrition discussed Advised to decrease amount of daily carbohydrate intake and daily calories and increase amount of plant-based protein in his diet  Prediabetes    Diet and nutrition discussed.  Advised to decrease amount of daily carbohydrate intake and daily calories and increase amount of plant-based protein in his diet      Patient Instructions  Health Maintenance After Age 70 After age 73, you are at a higher risk for certain long-term  diseases and infections as well as injuries from falls. Falls are a major cause of broken bones and head injuries in people who are older than age 68. Getting regular preventive care can help to keep you healthy and well. Preventive care includes getting regular testing and making lifestyle changes as recommended by your health care provider. Talk with your health care provider about: Which screenings and tests you should have. A screening is a test that checks for a disease when you have no symptoms. A diet and exercise plan that is right for you. What should I know about screenings and tests to prevent falls? Screening and testing are the best ways to find a health problem early. Early diagnosis and treatment give you the best chance of managing medical conditions that are common after age 70. Certain conditions and lifestyle choices may make you more likely to have a fall. Your health care provider may recommend: Regular vision checks. Poor vision and conditions such as cataracts can make you more likely to have a fall. If you wear glasses, make sure to get your prescription updated if your vision changes. Medicine review. Work with your health care provider to regularly review all of the medicines you are taking, including over-the-counter medicines. Ask your health care provider about any side effects that may make you more likely to have a fall. Tell your health care provider if any medicines that you take make you feel dizzy or sleepy. Strength and balance checks. Your health care provider may recommend certain tests to check your strength and balance while standing, walking, or changing positions. Foot health exam. Foot pain and numbness, as well as not wearing proper footwear, can make you more likely to have a fall. Screenings, including: Osteoporosis screening. Osteoporosis is a condition that causes the bones to get weaker and break more easily. Blood pressure screening. Blood pressure changes  and medicines to control blood pressure can make you feel dizzy. Depression screening. You may be more likely to have a fall if you have a fear of falling, feel depressed, or feel unable to do activities that you used to do. Alcohol use screening. Using too much alcohol can affect your balance and may make you more likely to have a fall. Follow these instructions at home: Lifestyle Do not drink alcohol if: Your health care provider tells you not to drink. If you drink alcohol: Limit how much you have to: 0-1 drink a day for women. 0-2 drinks a day for men. Know how much alcohol is in your drink. In the [place] one drink equals one 12 oz bottle of beer (355 mL), one 5 oz glass of wine (148 mL), or one 1 oz glass of hard liquor (44 mL). Do not use any products that contain nicotine or tobacco. These products include cigarettes, chewing tobacco, and vaping devices, such as e-cigarettes. If you need help quitting, ask your health care provider. Activity  Follow a regular exercise program to stay fit. This will help you maintain your balance. Ask your health care provider what types of exercise are appropriate for you. If you need a cane or walker, use it as recommended by your health care  provider. Wear supportive shoes that have nonskid soles. Safety  Remove any tripping hazards, such as rugs, cords, and clutter. Install safety equipment such as grab bars in bathrooms and safety rails on stairs. Keep rooms and walkways well-lit. General instructions Talk with your health care provider about your risks for falling. Tell your health care provider if: You fall. Be sure to tell your health care provider about all falls, even ones that seem minor. You feel dizzy, tiredness (fatigue), or off-balance. Take over-the-counter and prescription medicines only as told by your health care provider. These include supplements. Eat a healthy diet and maintain a healthy weight. A healthy diet includes low-fat  dairy products, low-fat (lean) meats, and fiber from whole grains, beans, and lots of fruits and vegetables. Stay current with your vaccines. Schedule regular health, dental, and eye exams. Summary Having a healthy lifestyle and getting preventive care can help to protect your health and wellness after age 77. Screening and testing are the best way to find a health problem early and help you avoid having a fall. Early diagnosis and treatment give you the best chance for managing medical conditions that are more common for people who are older than age 58. Falls are a major cause of broken bones and head injuries in people who are older than age 105. Take precautions to prevent a fall at home. Work with your health care provider to learn what changes you can make to improve your health and wellness and to prevent falls. This information is not intended to replace advice given to you by your health care provider. Make sure you discuss any questions you have with your health care provider. Document Revised: 02/15/2021 Document Reviewed: 02/15/2021 Elsevier Patient Education  2024 Elsevier Inc.      Edwina Barth, MD [facility]

## 2023-08-17 NOTE — Assessment & Plan Note (Signed)
Well-controlled hypertension Continue lisinopril 10 mg Dietary approaches to stop hypertension discussed Cardiovascular risks associated with hypertension discussed

## 2023-08-17 NOTE — Assessment & Plan Note (Signed)
Stable.  Takes alprazolam as needed

## 2023-08-17 NOTE — Patient Instructions (Signed)
Health Maintenance After Age 75 After age 75, you are at a higher risk for certain long-term diseases and infections as well as injuries from falls. Falls are a major cause of broken bones and head injuries in people who are older than age 75. Getting regular preventive care can help to keep you healthy and well. Preventive care includes getting regular testing and making lifestyle changes as recommended by your health care provider. Talk with your health care provider about: Which screenings and tests you should have. A screening is a test that checks for a disease when you have no symptoms. A diet and exercise plan that is right for you. What should I know about screenings and tests to prevent falls? Screening and testing are the best ways to find a health problem early. Early diagnosis and treatment give you the best chance of managing medical conditions that are common after age 75. Certain conditions and lifestyle choices may make you more likely to have a fall. Your health care provider may recommend: Regular vision checks. Poor vision and conditions such as cataracts can make you more likely to have a fall. If you wear glasses, make sure to get your prescription updated if your vision changes. Medicine review. Work with your health care provider to regularly review all of the medicines you are taking, including over-the-counter medicines. Ask your health care provider about any side effects that may make you more likely to have a fall. Tell your health care provider if any medicines that you take make you feel dizzy or sleepy. Strength and balance checks. Your health care provider may recommend certain tests to check your strength and balance while standing, walking, or changing positions. Foot health exam. Foot pain and numbness, as well as not wearing proper footwear, can make you more likely to have a fall. Screenings, including: Osteoporosis screening. Osteoporosis is a condition that causes  the bones to get weaker and break more easily. Blood pressure screening. Blood pressure changes and medicines to control blood pressure can make you feel dizzy. Depression screening. You may be more likely to have a fall if you have a fear of falling, feel depressed, or feel unable to do activities that you used to do. Alcohol use screening. Using too much alcohol can affect your balance and may make you more likely to have a fall. Follow these instructions at home: Lifestyle Do not drink alcohol if: Your health care provider tells you not to drink. If you drink alcohol: Limit how much you have to: 0-1 drink a day for women. 0-2 drinks a day for men. Know how much alcohol is in your drink. In the [place] one drink equals one 12 oz bottle of beer (355 mL), one 5 oz glass of wine (148 mL), or one 1 oz glass of hard liquor (44 mL). Do not use any products that contain nicotine or tobacco. These products include cigarettes, chewing tobacco, and vaping devices, such as e-cigarettes. If you need help quitting, ask your health care provider. Activity  Follow a regular exercise program to stay fit. This will help you maintain your balance. Ask your health care provider what types of exercise are appropriate for you. If you need a cane or walker, use it as recommended by your health care provider. Wear supportive shoes that have nonskid soles. Safety  Remove any tripping hazards, such as rugs, cords, and clutter. Install safety equipment such as grab bars in bathrooms and safety rails on stairs. Keep rooms and walkways   well-lit. General instructions Talk with your health care provider about your risks for falling. Tell your health care provider if: You fall. Be sure to tell your health care provider about all falls, even ones that seem minor. You feel dizzy, tiredness (fatigue), or off-balance. Take over-the-counter and prescription medicines only as told by your health care provider. These include  supplements. Eat a healthy diet and maintain a healthy weight. A healthy diet includes low-fat dairy products, low-fat (lean) meats, and fiber from whole grains, beans, and lots of fruits and vegetables. Stay current with your vaccines. Schedule regular health, dental, and eye exams. Summary Having a healthy lifestyle and getting preventive care can help to protect your health and wellness after age 75. Screening and testing are the best way to find a health problem early and help you avoid having a fall. Early diagnosis and treatment give you the best chance for managing medical conditions that are more common for people who are older than age 75. Falls are a major cause of broken bones and head injuries in people who are older than age 75. Take precautions to prevent a fall at home. Work with your health care provider to learn what changes you can make to improve your health and wellness and to prevent falls. This information is not intended to replace advice given to you by your health care provider. Make sure you discuss any questions you have with your health care provider. Document Revised: 02/15/2021 Document Reviewed: 02/15/2021 Elsevier Patient Education  2024 Elsevier Inc.  

## 2023-08-17 NOTE — Assessment & Plan Note (Signed)
No recent anginal episodes On long-term anticoagulation for pulmonary embolism

## 2023-09-06 ENCOUNTER — Telehealth: Payer: Self-pay | Admitting: Radiology

## 2023-09-06 NOTE — Telephone Encounter (Signed)
Forms will be left at the front office

## 2023-09-06 NOTE — Telephone Encounter (Signed)
LVM for patient to call back. Just wanted to know if patient wanted to pick the completed form or have it mailed to his house.

## 2023-09-06 NOTE — Telephone Encounter (Signed)
Patient's wife said she can pick up the form on 09/11/23.

## 2023-09-12 NOTE — Telephone Encounter (Signed)
Patient has picked up forms.

## 2023-10-28 ENCOUNTER — Other Ambulatory Visit: Payer: Self-pay | Admitting: Emergency Medicine

## 2023-12-07 ENCOUNTER — Other Ambulatory Visit: Payer: Self-pay | Admitting: Emergency Medicine

## 2023-12-07 NOTE — Telephone Encounter (Signed)
 Call patient and inquire about his use of tramadol.  What for, how frequently, side effects?  What is the need for a controlled substance analgesic?

## 2023-12-14 ENCOUNTER — Other Ambulatory Visit: Payer: Self-pay | Admitting: Emergency Medicine

## 2023-12-14 ENCOUNTER — Encounter: Payer: Self-pay | Admitting: Radiology

## 2023-12-14 NOTE — Telephone Encounter (Signed)
 Copied from CRM [id number]. Topic: Clinical - Medication Refill >> Dec 14, 2023 11:54 AM Florestine Avers wrote: Most Recent Primary Care Visit:  Provider: Georgina Quint  Department: [facility]  Visit Type: OFFICE VISIT  Date: 08/17/2023  Medication: Tramadol 50mg  tablet  Has the patient contacted their pharmacy? Yes (Agent: If no, request that the patient contact the pharmacy for the refill. If patient does not wish to contact the pharmacy document the reason why and proceed with request.) (Agent: If yes, when and what did the pharmacy advise?)  Is this the correct pharmacy for this prescription? Yes If no, delete pharmacy and type the correct one.  This is the patient's preferred pharmacy:  [id number] - [place], [place] - [address] AT [facility] [address] [place]  [address] Phone: [phone] Fax: [phone]   Has the prescription been filled recently? No  Is the patient out of the medication? Yes  Has the patient been seen for an appointment in the last year OR does the patient have an upcoming appointment? Yes  Can we respond through MyChart? Yes  Agent: Please be advised that Rx refills may take up to 3 business days. We ask that you follow-up with your pharmacy.

## 2023-12-17 ENCOUNTER — Other Ambulatory Visit: Payer: Self-pay | Admitting: Emergency Medicine

## 2023-12-17 MED ORDER — TRAMADOL HCL 50 MG PO TABS: 50.0000 mg | ORAL_TABLET | Freq: Four times a day (QID) | ORAL | 1 refills | Status: DC | PRN

## 2023-12-17 NOTE — Telephone Encounter (Signed)
New prescription for tramadol sent to pharmacy of record today.  Thanks.

## 2024-01-21 ENCOUNTER — Other Ambulatory Visit: Payer: Self-pay | Admitting: Emergency Medicine

## 2024-01-21 DIAGNOSIS — Z86711 Personal history of pulmonary embolism: Secondary | ICD-10-CM

## 2024-02-14 ENCOUNTER — Encounter: Payer: Self-pay | Admitting: Emergency Medicine

## 2024-02-14 ENCOUNTER — Other Ambulatory Visit: Payer: Self-pay | Admitting: Emergency Medicine

## 2024-02-14 ENCOUNTER — Ambulatory Visit: Payer: Medicare Other | Admitting: Emergency Medicine

## 2024-02-14 VITALS — BP 134/78 | HR 55 | Temp 97.6°F | Ht 74.0 in | Wt 322.0 lb

## 2024-02-14 DIAGNOSIS — G8929 Other chronic pain: Secondary | ICD-10-CM

## 2024-02-14 DIAGNOSIS — R7303 Prediabetes: Secondary | ICD-10-CM

## 2024-02-14 DIAGNOSIS — M25561 Pain in right knee: Secondary | ICD-10-CM

## 2024-02-14 DIAGNOSIS — Z86711 Personal history of pulmonary embolism: Secondary | ICD-10-CM | POA: Diagnosis not present

## 2024-02-14 DIAGNOSIS — Z7901 Long term (current) use of anticoagulants: Secondary | ICD-10-CM

## 2024-02-14 DIAGNOSIS — I2510 Atherosclerotic heart disease of native coronary artery without angina pectoris: Secondary | ICD-10-CM

## 2024-02-14 DIAGNOSIS — I10 Essential (primary) hypertension: Secondary | ICD-10-CM

## 2024-02-14 DIAGNOSIS — E785 Hyperlipidemia, unspecified: Secondary | ICD-10-CM | POA: Diagnosis not present

## 2024-02-14 DIAGNOSIS — M25562 Pain in left knee: Secondary | ICD-10-CM

## 2024-02-14 LAB — COMPREHENSIVE METABOLIC PANEL WITH GFR
ALT: 18 U/L (ref 0–53)
AST: 17 U/L (ref 0–37)
Albumin: 4.3 g/dL (ref 3.5–5.2)
Alkaline Phosphatase: 54 U/L (ref 39–117)
BUN: 23 mg/dL (ref 6–23)
CO2: 27 meq/L (ref 19–32)
Calcium: 9.5 mg/dL (ref 8.4–10.5)
Chloride: 104 meq/L (ref 96–112)
Creatinine, Ser: 1.02 mg/dL (ref 0.40–1.50)
GFR: 71.67 mL/min (ref >60.00)
Glucose, Bld: 88 mg/dL (ref 70–99)
Potassium: 4 meq/L (ref 3.5–5.1)
Sodium: 139 meq/L (ref 135–145)
Total Bilirubin: 0.6 mg/dL (ref 0.2–1.2)
Total Protein: 7.1 g/dL (ref 6.0–8.3)

## 2024-02-14 LAB — CBC WITH DIFFERENTIAL/PLATELET
Basophils Absolute: 0.1 10*3/uL (ref 0.0–0.1)
Basophils Relative: 0.9 % (ref 0.0–3.0)
Eosinophils Absolute: 0.2 10*3/uL (ref 0.0–0.7)
Eosinophils Relative: 3.3 % (ref 0.0–5.0)
HCT: 45.9 % (ref 39.0–52.0)
Hemoglobin: 15 g/dL (ref 13.0–17.0)
Lymphocytes Relative: 23.6 % (ref 12.0–46.0)
Lymphs Abs: 1.7 10*3/uL (ref 0.7–4.0)
MCHC: 32.6 g/dL (ref 30.0–36.0)
MCV: 85 fl (ref 78.0–100.0)
Monocytes Absolute: 0.9 10*3/uL (ref 0.1–1.0)
Monocytes Relative: 12.1 % — ABNORMAL HIGH (ref 3.0–12.0)
Neutro Abs: 4.3 10*3/uL (ref 1.4–7.7)
Neutrophils Relative %: 60.1 % (ref 43.0–77.0)
Platelets: 221 10*3/uL (ref 150.0–400.0)
RBC: 5.41 Mil/uL (ref 4.22–5.81)
RDW: 15.5 % (ref 11.5–15.5)
WBC: 7.2 10*3/uL (ref 4.0–10.5)

## 2024-02-14 LAB — HEMOGLOBIN A1C: Hgb A1c MFr Bld: 7.3 % — ABNORMAL HIGH (ref 4.6–6.5)

## 2024-02-14 LAB — LIPID PANEL
Cholesterol: 126 mg/dL (ref 0–200)
HDL: 47.1 mg/dL (ref >39.00)
LDL Cholesterol: 63 mg/dL (ref 0–99)
NonHDL: 79.1
Total CHOL/HDL Ratio: 3
Triglycerides: 80 mg/dL (ref 0.0–149.0)
VLDL: 16 mg/dL (ref 0.0–40.0)

## 2024-02-14 MED ORDER — METFORMIN HCL 500 MG PO TABS: 500.0000 mg | ORAL_TABLET | Freq: Two times a day (BID) | ORAL | 3 refills | Status: AC

## 2024-02-14 NOTE — Assessment & Plan Note (Addendum)
 BP Readings from Last 3 Encounters:  02/14/24 134/78  08/17/23 124/80  02/15/23 132/72  Well-controlled hypertension Continue lisinopril  10 mg Dietary approaches to stop hypertension discussed Cardiovascular risks associated with hypertension discussed

## 2024-02-14 NOTE — Patient Instructions (Signed)
 Health Maintenance After Age 76 After age 4, you are at a higher risk for certain long-term diseases and infections as well as injuries from falls. Falls are a major cause of broken bones and head injuries in people who are older than age 47. Getting regular preventive care can help to keep you healthy and well. Preventive care includes getting regular testing and making lifestyle changes as recommended by your health care provider. Talk with your health care provider about: Which screenings and tests you should have. A screening is a test that checks for a disease when you have no symptoms. A diet and exercise plan that is right for you. What should I know about screenings and tests to prevent falls? Screening and testing are the best ways to find a health problem early. Early diagnosis and treatment give you the best chance of managing medical conditions that are common after age 37. Certain conditions and lifestyle choices may make you more likely to have a fall. Your health care provider may recommend: Regular vision checks. Poor vision and conditions such as cataracts can make you more likely to have a fall. If you wear glasses, make sure to get your prescription updated if your vision changes. Medicine review. Work with your health care provider to regularly review all of the medicines you are taking, including over-the-counter medicines. Ask your health care provider about any side effects that may make you more likely to have a fall. Tell your health care provider if any medicines that you take make you feel dizzy or sleepy. Strength and balance checks. Your health care provider may recommend certain tests to check your strength and balance while standing, walking, or changing positions. Foot health exam. Foot pain and numbness, as well as not wearing proper footwear, can make you more likely to have a fall. Screenings, including: Osteoporosis screening. Osteoporosis is a condition that causes  the bones to get weaker and break more easily. Blood pressure screening. Blood pressure changes and medicines to control blood pressure can make you feel dizzy. Depression screening. You may be more likely to have a fall if you have a fear of falling, feel depressed, or feel unable to do activities that you used to do. Alcohol use screening. Using too much alcohol can affect your balance and may make you more likely to have a fall. Follow these instructions at home: Lifestyle Do not drink alcohol if: Your health care provider tells you not to drink. If you drink alcohol: Limit how much you have to: 0-1 drink a day for women. 0-2 drinks a day for men. Know how much alcohol is in your drink. In the [place] one drink equals one 12 oz bottle of beer (355 mL), one 5 oz glass of wine (148 mL), or one 1 oz glass of hard liquor (44 mL). Do not use any products that contain nicotine or tobacco. These products include cigarettes, chewing tobacco, and vaping devices, such as e-cigarettes. If you need help quitting, ask your health care provider. Activity  Follow a regular exercise program to stay fit. This will help you maintain your balance. Ask your health care provider what types of exercise are appropriate for you. If you need a cane or walker, use it as recommended by your health care provider. Wear supportive shoes that have nonskid soles. Safety  Remove any tripping hazards, such as rugs, cords, and clutter. Install safety equipment such as grab bars in bathrooms and safety rails on stairs. Keep rooms and walkways  well-lit. General instructions Talk with your health care provider about your risks for falling. Tell your health care provider if: You fall. Be sure to tell your health care provider about all falls, even ones that seem minor. You feel dizzy, tiredness (fatigue), or off-balance. Take over-the-counter and prescription medicines only as told by your health care provider. These include  supplements. Eat a healthy diet and maintain a healthy weight. A healthy diet includes low-fat dairy products, low-fat (lean) meats, and fiber from whole grains, beans, and lots of fruits and vegetables. Stay current with your vaccines. Schedule regular health, dental, and eye exams. Summary Having a healthy lifestyle and getting preventive care can help to protect your health and wellness after age 11. Screening and testing are the best way to find a health problem early and help you avoid having a fall. Early diagnosis and treatment give you the best chance for managing medical conditions that are more common for people who are older than age 28. Falls are a major cause of broken bones and head injuries in people who are older than age 48. Take precautions to prevent a fall at home. Work with your health care provider to learn what changes you can make to improve your health and wellness and to prevent falls. This information is not intended to replace advice given to you by your health care provider. Make sure you discuss any questions you have with your health care provider. Document Revised: 02/15/2021 Document Reviewed: 02/15/2021 Elsevier Patient Education  2024 [employer].

## 2024-02-14 NOTE — Assessment & Plan Note (Signed)
 Diet and nutrition discussed.  Advised to decrease amount of daily carbohydrate intake and daily calories and increase amount of plant based protein in his diet

## 2024-02-14 NOTE — Assessment & Plan Note (Signed)
No clinically significant bleeding episodes recently Fall precautions given

## 2024-02-14 NOTE — Assessment & Plan Note (Signed)
Stable.  On Xarelto 20 mg for lifetime

## 2024-02-14 NOTE — Assessment & Plan Note (Signed)
 Chronic and affecting quality of life. Puts him at risk for falls Needs orthopedic evaluation. Has orthopedist to follow-up with. 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

## 2024-02-14 NOTE — Progress Notes (Signed)
 [name] 78 y.o.   No chief complaint on file.   HISTORY OF PRESENT ILLNESS: This is a 78 y.o. male A1A  here for 87-month follow-up of chronic medical conditions Chronic bilateral knee pain.  Needs orthopedic evaluation.  Takes tramadol  sparingly as needed Stopped taking furosemide  due to excessive diuresis Otherwise doing well.  Today accompanied by wife. No other complaints or medical concerns today.  HPI   Prior to Admission medications   Medication Sig Start Date End Date Taking? Authorizing Provider  acetaminophen  (TYLENOL ) 325 MG tablet Take 2 tablets (650 mg total) by mouth every 6 (six) hours as needed for mild pain (or Fever >/= 101). 08/24/19  Yes Colin Dawley, MD  ALPRAZolam  (XANAX ) 0.5 MG tablet Take 1 tablet (0.5 mg total) by mouth daily as needed for anxiety. 08/17/23  Yes Raney Antwine, Isidro Margo, MD  Docusate Sodium  (COLACE PO) Take by mouth daily.   Yes [provider]  furosemide  (LASIX ) 40 MG tablet TAKE 1/2 TABLET DAILY FOR 5 DAYS, THEN TAKE 1 TABLET DAILY THEREAFTER 05/10/23  Yes Chantavia Bazzle, Isidro Margo, MD  lisinopril  (ZESTRIL ) 10 MG tablet TAKE 1 TABLET BY MOUTH EVERY DAY 08/01/23  Yes Kamori Kitchens Jose, MD  pantoprazole  (PROTONIX ) 40 MG tablet TAKE 1 TABLET BY MOUTH EVERY DAY 07/09/23  Yes Kalliopi Coupland Jose, MD  rosuvastatin  (CRESTOR ) 20 MG tablet TAKE 1 TABLET BY MOUTH EVERY DAY 10/29/23  Yes Latissa Frick, Isidro Margo, MD  Sennosides (SENOKOT PO) Take by mouth daily.   Yes [provider]  traMADol  (ULTRAM ) 50 MG tablet Take 1 tablet (50 mg total) by mouth every 6 (six) hours as needed. 12/17/23  Yes Elvira Hammersmith, MD  XARELTO  20 MG TABS tablet TAKE 1 TABLET BY MOUTH DAILY WITH SUPPER 01/22/24  Yes Lestine Rahe Jose, MD    No Known Allergies  Patient Active Problem List   Diagnosis Date Noted   Morbid obesity (HCC) 02/15/2023   Prediabetes 02/15/2023   History of pulmonary embolism 02/25/2020   Current use of long term  anticoagulation 02/25/2020   Pulmonary nodules/lesions, multiple 02/25/2020   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   CAD in native artery 08/31/2016   Essential hypertension 08/31/2016   Dyslipidemia 08/31/2016   Family history of heart disease in male family member before age 22 08/31/2016   Hearing loss, bilateral 08/31/2016   Generalized anxiety disorder 08/31/2016   History of colonic polyps 08/31/2016    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    Past Surgical History:  Procedure Laterality Date   CORONARY ANGIOPLASTY WITH STENT PLACEMENT     ENDOVENOUS ABLATION SAPHENOUS VEIN W/ LASER Left 06/02/2022   endovenous laser ablation left greater saphenous vein by Angela Kell MD   FRACTURE SURGERY  1995   right elbow   KNEE ARTHROSCOPY W/ MENISCAL REPAIR      Social History   Socioeconomic History   Marital status: Married    Spouse name: [name]   Number of children: 2   Years of education: 13   Highest education level: 12th grade  Occupational History   Occupation: retired    Comment: packing/shipping  Tobacco Use   Smoking  status: Former    Current packs/day: 0.00    Average packs/day: 0.3 packs/day for 3.0 years (0.9 ttl pk-yrs)    Types: Cigarettes    Start date: 08/31/1966    Quit date: 08/31/1969    Years since quitting: 54.4   Smokeless tobacco: Never  Vaping Use   Vaping status: Never Used  Substance and Sexual Activity   Alcohol use: Yes    Alcohol/week: 3.0 standard drinks of alcohol    Types: 2 Cans of beer, 1 Shots of liquor per week    Comment: occassionally   Drug use: No   Sexual activity: Yes  Other Topics Concern   Not on file  Social History Narrative   Not on file   Social  Drivers of Health   Financial Resource Strain: Low Risk  (02/10/2024)   Overall Financial Resource Strain (CARDIA)    Difficulty of Paying Living Expenses: Not hard at all  Food Insecurity: No Food Insecurity (02/10/2024)   Hunger Vital Sign    Worried About Running Out of Food in the Last Year: Never true    Ran Out of Food in the Last Year: Never true  Transportation Needs: No Transportation Needs (02/10/2024)   PRAPARE - Administrator, Civil Service (Medical): No    Lack of Transportation (Non-Medical): No  Physical Activity: Insufficiently Active (02/10/2024)   Exercise Vital Sign    Days of Exercise per Week: 4 days    Minutes of Exercise per Session: 30 min  Stress: Stress Concern Present (02/10/2024)   [employer] of Occupational Health - Occupational Stress Questionnaire    Feeling of Stress : To some extent  Social Connections: Unknown (02/10/2024)   Social Connection and Isolation Panel [NHANES]    Frequency of Communication with Friends and Family: Once a week    Frequency of Social Gatherings with Friends and Family: Patient declined    Attends Religious Services: Never    Database administrator or Organizations: No    Attends Banker Meetings: Never    Marital Status: Married  Catering manager Violence: Not At Risk (07/07/2023)   Humiliation, Afraid, Rape, and Kick questionnaire    Fear of Current or Ex-Partner: No    Emotionally Abused: No    Physically Abused: No    Sexually Abused: No    Family History  Problem Relation Age of Onset   Heart disease Mother 21       heart attack   Lung disease Father        asbestosis   Clotting disorder Father    Asthma Daughter    Hyperlipidemia Daughter    Alcohol abuse Paternal Uncle    Cancer Paternal Grandfather        lung     Review of Systems  Constitutional: Negative.  Negative for chills and fever.  HENT:  Positive for hearing loss. Negative for congestion and sore throat.   Respiratory:  Negative.  Negative for cough and shortness of breath.   Cardiovascular: Negative.  Negative for chest pain and palpitations.  Gastrointestinal:  Negative for abdominal pain, diarrhea, nausea and vomiting.  Genitourinary: Negative.  Negative for dysuria and hematuria.  Musculoskeletal:  Positive for joint pain.  Skin: Negative.  Negative for rash.  Neurological:  Negative for dizziness and headaches.  All other systems reviewed and are negative.   Vitals:   02/14/24 1306  BP: 134/78  Pulse: (!) 55  Temp: 97.6 F (36.4 C)  SpO2: 95%  Physical Exam Vitals reviewed.  Constitutional:      Appearance: Normal appearance.  HENT:     Head: Normocephalic.     Mouth/Throat:     Mouth: Mucous membranes are moist.     Pharynx: Oropharynx is clear.  Eyes:     Extraocular Movements: Extraocular movements intact.     Pupils: Pupils are equal, round, and reactive to light.  Cardiovascular:     Rate and Rhythm: Normal rate and regular rhythm.     Pulses: Normal pulses.     Heart sounds: Normal heart sounds.  Pulmonary:     Effort: Pulmonary effort is normal.     Breath sounds: Normal breath sounds.  Skin:    General: Skin is warm and dry.     Capillary Refill: Capillary refill takes less than 2 seconds.  Neurological:     General: No focal deficit present.     Mental Status: He is alert and oriented to person, place, and time.  Psychiatric:        Mood and Affect: Mood normal.        Behavior: Behavior normal.      ASSESSMENT & PLAN: A total of 45 minutes was spent with the patient and counseling/coordination of care regarding preparing for this visit, review of most recent office visit notes, review of multiple chronic medical conditions and their management, diagnosis of chronic pain to both knees and need for orthopedic evaluation, pain management, review of all medications, review of most recent bloodwork results, review of health maintenance items, education on nutrition,  prognosis, documentation, and need for follow up.   Problem List Items Addressed This Visit       Cardiovascular and Mediastinum   CAD in native artery   No recent anginal episodes On long-term anticoagulation for pulmonary embolism      Relevant Orders   Comprehensive metabolic panel with GFR   CBC with Differential/Platelet   Hemoglobin A1c   Lipid panel   Essential hypertension - Primary   BP Readings from Last 3 Encounters:  02/14/24 134/78  08/17/23 124/80  02/15/23 132/72  Well-controlled hypertension Continue lisinopril  10 mg Dietary approaches to stop hypertension discussed Cardiovascular risks associated with hypertension discussed       Relevant Orders   Comprehensive metabolic panel with GFR   CBC with Differential/Platelet   Hemoglobin A1c   Lipid panel     Other   Dyslipidemia   Diet and nutrition discussed Continue rosuvastatin  20 mg daily      History of pulmonary embolism   Stable. On Xarelto  20 mg for lifetime       Relevant Orders   Comprehensive metabolic panel with GFR   CBC with Differential/Platelet   Hemoglobin A1c   Lipid panel   Current use of long term anticoagulation   No clinically significant bleeding episodes recently Fall precautions given      Relevant Orders   Comprehensive metabolic panel with GFR   CBC with Differential/Platelet   Hemoglobin A1c   Lipid panel   Morbid obesity (HCC)   Diet and nutrition discussed Advised to decrease amount of daily carbohydrate intake and daily calories and increase amount of plant-based protein in his diet      Relevant Orders   Comprehensive metabolic panel with GFR   CBC with Differential/Platelet   Hemoglobin A1c   Lipid panel   Prediabetes   Diet and nutrition discussed. Advised to decrease amount of daily carbohydrate intake and daily calories and increase amount  of plant-based protein in his diet       Relevant Orders   Comprehensive metabolic panel with GFR   CBC  with Differential/Platelet   Hemoglobin A1c   Lipid panel   Chronic pain of both knees   Chronic and affecting quality of life. Puts him at risk for falls Needs orthopedic evaluation. Has orthopedist to follow-up with. 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        Patient Instructions  Health Maintenance After Age 7 After age 49, you are at a higher risk for certain long-term diseases and infections as well as injuries from falls. Falls are a major cause of broken bones and head injuries in people who are older than age 26. Getting regular preventive care can help to keep you healthy and well. Preventive care includes getting regular testing and making lifestyle changes as recommended by your health care provider. Talk with your health care provider about: Which screenings and tests you should have. A screening is a test that checks for a disease when you have no symptoms. A diet and exercise plan that is right for you. What should I know about screenings and tests to prevent falls? Screening and testing are the best ways to find a health problem early. Early diagnosis and treatment give you the best chance of managing medical conditions that are common after age 90. Certain conditions and lifestyle choices may make you more likely to have a fall. Your health care provider may recommend: Regular vision checks. Poor vision and conditions such as cataracts can make you more likely to have a fall. If you wear glasses, make sure to get your prescription updated if your vision changes. Medicine review. Work with your health care provider to regularly review all of the medicines you are taking, including over-the-counter medicines. Ask your health care provider about any side effects that may make you  more likely to have a fall. Tell your health care provider if any medicines that you take make you feel dizzy or sleepy. Strength and balance checks. Your health care provider may recommend certain tests to check your strength and balance while standing, walking, or changing positions. Foot health exam. Foot pain and numbness, as well as not wearing proper footwear, can make you more likely to have a fall. Screenings, including: Osteoporosis screening. Osteoporosis is a condition that causes the bones to get weaker and break more easily. Blood pressure screening. Blood pressure changes and medicines to control blood pressure can make you feel dizzy. Depression screening. You may be more likely to have a fall if you have a fear of falling, feel depressed, or feel unable to do activities that you used to do. Alcohol use screening. Using too much alcohol can affect your balance and may make you more likely to have a fall. Follow these instructions at home: Lifestyle Do not drink alcohol if: Your health care provider tells you not to drink. If you drink alcohol: Limit how much you have to: 0-1 drink a day for women. 0-2 drinks a day for men. Know how much alcohol is in your drink. In the [place] one drink equals one 12 oz bottle of beer (355 mL), one 5 oz glass of wine (148 mL), or one 1 oz glass of hard liquor (44 mL).  Do not use any products that contain nicotine or tobacco. These products include cigarettes, chewing tobacco, and vaping devices, such as e-cigarettes. If you need help quitting, ask your health care provider. Activity  Follow a regular exercise program to stay fit. This will help you maintain your balance. Ask your health care provider what types of exercise are appropriate for you. If you need a cane or walker, use it as recommended by your health care provider. Wear supportive shoes that have nonskid soles. Safety  Remove any tripping hazards, such as rugs, cords, and  clutter. Install safety equipment such as grab bars in bathrooms and safety rails on stairs. Keep rooms and walkways well-lit. General instructions Talk with your health care provider about your risks for falling. Tell your health care provider if: You fall. Be sure to tell your health care provider about all falls, even ones that seem minor. You feel dizzy, tiredness (fatigue), or off-balance. Take over-the-counter and prescription medicines only as told by your health care provider. These include supplements. Eat a healthy diet and maintain a healthy weight. A healthy diet includes low-fat dairy products, low-fat (lean) meats, and fiber from whole grains, beans, and lots of fruits and vegetables. Stay current with your vaccines. Schedule regular health, dental, and eye exams. Summary Having a healthy lifestyle and getting preventive care can help to protect your health and wellness after age 37. Screening and testing are the best way to find a health problem early and help you avoid having a fall. Early diagnosis and treatment give you the best chance for managing medical conditions that are more common for people who are older than age 25. Falls are a major cause of broken bones and head injuries in people who are older than age 45. Take precautions to prevent a fall at home. Work with your health care provider to learn what changes you can make to improve your health and wellness and to prevent falls. This information is not intended to replace advice given to you by your health care provider. Make sure you discuss any questions you have with your health care provider. Document Revised: 02/15/2021 Document Reviewed: 02/15/2021 Elsevier Patient Education  2024 [employer]    Maryagnes Small, MD [facility]

## 2024-02-14 NOTE — Assessment & Plan Note (Signed)
 Diet and nutrition discussed.  Continue rosuvastatin 20 mg daily.

## 2024-02-14 NOTE — Assessment & Plan Note (Signed)
No recent anginal episodes On long-term anticoagulation for pulmonary embolism

## 2024-03-29 ENCOUNTER — Other Ambulatory Visit (INDEPENDENT_AMBULATORY_CARE_PROVIDER_SITE_OTHER): Payer: Self-pay

## 2024-03-29 ENCOUNTER — Other Ambulatory Visit: Payer: Self-pay

## 2024-03-29 ENCOUNTER — Encounter: Payer: Self-pay | Admitting: Orthopedic Surgery

## 2024-03-29 ENCOUNTER — Ambulatory Visit: Admitting: Orthopedic Surgery

## 2024-03-29 VITALS — BP 158/92 | HR 57 | Ht 74.0 in | Wt 316.0 lb

## 2024-03-29 DIAGNOSIS — G8929 Other chronic pain: Secondary | ICD-10-CM

## 2024-03-29 DIAGNOSIS — M1712 Unilateral primary osteoarthritis, left knee: Secondary | ICD-10-CM

## 2024-03-29 DIAGNOSIS — M170 Bilateral primary osteoarthritis of knee: Secondary | ICD-10-CM | POA: Diagnosis not present

## 2024-03-29 DIAGNOSIS — M1711 Unilateral primary osteoarthritis, right knee: Secondary | ICD-10-CM

## 2024-03-29 MED ORDER — METHYLPREDNISOLONE ACETATE 40 MG/ML IJ SUSP
40.0000 mg | Freq: Once | INTRAMUSCULAR | Status: AC
  Administered 2024-03-29: 40 mg via INTRA_ARTICULAR

## 2024-03-29 NOTE — Patient Instructions (Signed)
Joint Steroid Injection A joint steroid injection is a procedure to relieve swelling and pain in a joint. Steroids are medicines that reduce inflammation. In this procedure, your health care provider uses a syringe and a needle to inject a steroid medicine into a painful and inflamed joint. A pain-relieving medicine (anesthetic) may be injected along with the steroid. In some cases, your health care provider may use an imaging technique such as ultrasound or fluoroscopy to guide the injection. Joints that are often treated with steroid injections include the knee, shoulder, hip, and spine. These injections may also be used in the elbow, ankle, and joints of the hands or feet. You may have joint steroid injections as part of your treatment for inflammation caused by: Gout. Rheumatoid arthritis. Advanced wear-and-tear arthritis (osteoarthritis). Tendinitis. Bursitis. Joint steroid injections may be repeated, but having them too often can damage a joint or the skin over the joint. You should not have joint steroid injections less than 6 weeks apart or more than four times a year. Tell a health care provider about: Any allergies you have. All medicines you are taking, including vitamins, herbs, eye drops, creams, and over-the-counter medicines. Any problems you or family members have had with anesthetic medicines. Any blood disorders you have. Any surgeries you have had. Any medical conditions you have. Whether you are pregnant or may be pregnant. What are the risks? Generally, this is a safe treatment. However, problems may occur, including: Infection. Bleeding. Allergic reactions to medicines. Damage to the joint or tissues around the joint. Thinning of skin or loss of skin color over the joint. Temporary flushing of the face or chest. Temporary increase in pain. Temporary increase in blood sugar. Failure to relieve inflammation or pain. What happens before the treatment? Medicines Ask  your health care provider about: Changing or stopping your regular medicines. This is especially important if you are taking diabetes medicines or blood thinners. Taking medicines such as aspirin and ibuprofen. These medicines can thin your blood. Do not take these medicines unless your health care provider tells you to take them. Taking over-the-counter medicines, vitamins, herbs, and supplements. General instructions You may have imaging tests of your joint. Ask your health care provider if you can drive yourself home after the procedure. What happens during the treatment?  Your health care provider will position you for the injection and locate the injection site over your joint. The skin over the joint will be cleaned with a germ-killing soap. Your health care provider may: Spray a numbing solution (topical anesthetic) over the injection site. Inject a local anesthetic under the skin above your joint. The needle will be placed through your skin into your joint. Your health care provider may use imaging to guide the needle to the right spot for the injection. If imaging is used, a special contrast dye may be injected to confirm that the needle is in the correct location. The steroid medicine will be injected into your joint. Anesthetic may be injected along with the steroid. This may be a medicine that relieves pain for a short time (short-acting anesthetic) or for a longer time (long-acting anesthetic). The needle will be removed, and an adhesive bandage (dressing) will be placed over the injection site. The procedure may vary among health care providers and hospitals. What can I expect after the treatment? You will be able to go home after the treatment. It is normal to feel slight flushing for a few days after the injection. After the treatment, it is  common to have an increase in joint pain after the anesthetic has worn off. This may happen about an hour after a short-acting anesthetic  or about 8 hours after a longer-acting anesthetic. You should begin to feel relief from joint pain and swelling after 24 to 48 hours. Contact your health care provider if you do not begin to feel relief after 2 days. Follow these instructions at home: Injection site care Leave the adhesive dressing over your injection site in place until your health care provider says you can remove it. Check your injection site every day for signs of infection. Check for: More redness, swelling, or pain. Fluid or blood. Warmth. Pus or a bad smell. Activity Return to your normal activities as told by your health care provider. Ask your health care provider what activities are safe for you. You may be asked to limit activities that put stress on the joint for a few days. Do joint exercises as told by your health care provider. Do not take baths, swim, or use a hot tub until your health care provider approves. Ask your health care provider if you may take showers. You may only be allowed to take sponge baths. Managing pain, stiffness, and swelling  If directed, put ice on the joint. To do this: Put ice in a plastic bag. Place a towel between your skin and the bag. Leave the ice on for 20 minutes, 2-3 times a day. Remove the ice if your skin turns bright red. This is very important. If you cannot feel pain, heat, or cold, you have a greater risk of damage to the area. Raise (elevate) your joint above the level of your heart when you are sitting or lying down. General instructions Take over-the-counter and prescription medicines only as told by your health care provider. Do not use any products that contain nicotine or tobacco, such as cigarettes, e-cigarettes, and chewing tobacco. These can delay joint healing. If you need help quitting, ask your health care provider. If you have diabetes, be aware that your blood sugar may be slightly elevated for several days after the injection. Keep all follow-up visits.  This is important. Contact a health care provider if you have: Chills or a fever. Any signs of infection at your injection site. Increased pain or swelling or no relief after 2 days. Summary A joint steroid injection is a treatment to relieve pain and swelling in a joint. Steroids are medicines that reduce inflammation. Your health care provider may add an anesthetic along with the steroid. You may have joint steroid injections as part of your arthritis treatment. Joint steroid injections may be repeated, but having them too often can damage a joint or the skin over the joint. Contact your health care provider if you have a fever, chills, or signs of infection, or if you get no relief from joint pain or swelling. This information is not intended to replace advice given to you by your health care provider. Make sure you discuss any questions you have with your health care provider. Document Revised: 03/06/2020 Document Reviewed: 03/06/2020 Elsevier Patient Education  2024 [employer].

## 2024-03-29 NOTE — Addendum Note (Signed)
 Addended byArla Lab on: 03/29/2024 10:45 AM   Modules accepted: Orders

## 2024-03-29 NOTE — Progress Notes (Signed)
  Intake history:   BP (!) 158/92   Pulse (!) 57   Ht 6' 2 (1.88 m)   Wt (!) 316 lb (143.3 kg)   BMI 40.57 kg/m  Body mass index is 40.57 kg/m.       WHAT ARE WE SEEING YOU FOR TODAY?    bilateral knee(s)   How long has this bothered you? (DOI?DOS?WS?)   approximately 20 years(s) ago   Anticoag.  Yes   Diabetes Yes   Heart disease Yes   Hypertension Yes   SMOKING HX No   Kidney disease No   Any ALLERGIES __________ Allergies  No Known Allergies       ____________________________________     Treatment:  Have you taken:  Tylenol  Yes  Advil No  Had PT No  Had injection No  Other  ________________surgery on Right knee 8 years ago for torn meniscus did not help knee pain _________     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   Past Surgical History:  Procedure Laterality Date   CORONARY ANGIOPLASTY WITH STENT PLACEMENT     ENDOVENOUS ABLATION SAPHENOUS VEIN W/ LASER Left 06/02/2022   endovenous laser ablation left greater saphenous vein by Angela Kell MD   FRACTURE SURGERY  1995   right elbow   KNEE ARTHROSCOPY W/ MENISCAL REPAIR     Current Outpatient Medications  Medication Instructions   acetaminophen  (TYLENOL ) 650 mg, Oral, Every 6 hours PRN   ALPRAZolam  (XANAX ) 0.5 mg, Oral, Daily PRN   Docusate Sodium  (COLACE PO) Daily   lisinopril  (ZESTRIL ) 10 mg, Oral, Daily   metFORMIN  (GLUCOPHAGE ) 500 mg, Oral, 2 times daily with meals   pantoprazole  (PROTONIX ) 40 mg, Oral, Daily   rosuvastatin  (CRESTOR ) 20 mg, Oral, Daily   Sennosides (SENOKOT PO) Daily   traMADol  (ULTRAM ) 50 mg, Oral, Every 6 hours PRN   Xarelto  20 mg, Oral, Daily with supper

## 2024-03-29 NOTE — Progress Notes (Signed)
  Intake history:  BP (!) 158/92   Pulse (!) 57   Ht 6' 2 (1.88 m)   Wt (!) 316 lb (143.3 kg)   BMI 40.57 kg/m  Body mass index is 40.57 kg/m.    WHAT ARE WE SEEING YOU FOR TODAY?   bilateral knee(s)  How long has this bothered you? (DOI?DOS?WS?)  approximately 20 years(s) ago  Anticoag.  Yes  Diabetes Yes  Heart disease Yes  Hypertension Yes  SMOKING HX No  Kidney disease No  Any ALLERGIES __________No Known Allergies   ____________________________________   Treatment:  Have you taken:  Tylenol  Yes  Advil No  Had PT No  Had injection No  Other  ________________surgery on Right knee 8 years ago for torn meniscus did not help knee pain _________

## 2024-03-29 NOTE — Progress Notes (Signed)
 Office Visit Note   Patient: [name]           Date of Birth: April 12, 1946           MRN: [id number] Visit Date: 03/29/2024 Requested by: Elvira Hammersmith, MD [address] [place],  [place] [address] PCP: Elvira Hammersmith, MD   Assessment & Plan:   Encounter Diagnoses  Name Primary?   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  After cleaning the skin with alcohol a 20-gauge needle was used to inject the left knee joint. There were no complications. A sterile bandage was applied.   Procedure note right knee injection verbal consent was obtained to inject right knee joint  Timeout was completed to confirm the site of injection  The medications used were 40 mg depomedrol and 3 cc of 1% lidocaine  Anesthesia was provided by ethyl chloride and the skin was prepped with alcohol.  After cleaning the skin with alcohol a 20-gauge needle was used to inject the right knee joint. There  were no complications. A sterile bandage was applied.    Subjective: Chief Complaint  Patient presents with   Knee Pain    Bilateral     HPI: 78 year old male status post right knee arthroscopy history of PE presents with bilateral knee pain.  Bilateral knee pain described as aching worse with cold weather and rainy days.  Tries to stay active.  He does use a cane.  He has global knee pain on both knees.  He has trouble getting in and out of the chair he has made accommodations at his house such as elevated toilet seat and shower chair              ROS: Currently no chest pain or shortness of breath   Images personally read and my interpretation :  DG Knee AP/LAT W/Sunrise Right Result Date: 03/29/2024 X-rays right knee and left knee Medial compartment narrowing of both knees with osteophyte formation joint space narrowing subluxation left knee and right knee Alignment still appears to be varus Overall impression medial and patellofemoral joint OA with mild patellar subluxation   DG Knee AP/LAT W/Sunrise Left Result Date: 03/29/2024 X-rays right knee and left knee Medial compartment narrowing of both knees with osteophyte formation joint space narrowing subluxation left knee and right knee Alignment still appears to be varus Overall impression medial and patellofemoral joint OA with mild patellar subluxation     Visit Diagnoses:  1. Chronic pain of both  knees   2. Primary osteoarthritis of right knee   3. Primary osteoarthritis of left knee      Follow-Up Instructions: Return in about 3 months (around 06/29/2024) for FOLLOW UP, LEFT, RIGHT, KNEE, OA S/P INJX.    Objective: Vital Signs: BP (!) 158/92   Pulse (!) 57   Ht 6' 2 (1.88 m)   Wt (!) 316 lb (143.3 kg)   BMI 40.57 kg/m   Physical Exam General Appearance normal with BMI of 40  Mental status awake alert and oriented x 3  Psychiatric mood and affect normal  Vascular mild peripheral edema normal color  Ortho  Exam  Right knee Tenderness was really global no specific area Skin was intact range of motion 0-1 20 Stability normal Muscle tone normal  Left knee Tenderness Global Skin intact Range of motion 120 degrees of flexion Instability none Muscle tone    Specialty Comments:  No specialty comments available.  Imaging: DG Knee AP/LAT W/Sunrise Right Result Date: 03/29/2024 X-rays right knee and left knee Medial compartment narrowing of both knees with osteophyte formation joint space narrowing subluxation left knee and right knee Alignment still appears to be varus Overall impression medial and patellofemoral joint OA with mild patellar subluxation   DG Knee AP/LAT W/Sunrise Left Result Date: 03/29/2024 X-rays right knee and left knee Medial compartment narrowing of both knees with osteophyte formation joint space narrowing subluxation left knee and right knee Alignment still appears to be varus Overall impression medial and patellofemoral joint OA with mild patellar subluxation     PMFS History: Patient Active Problem List   Diagnosis Date Noted   Chronic pain of both knees 02/14/2024   Morbid obesity (HCC) 02/15/2023   Prediabetes 02/15/2023   History of pulmonary embolism 02/25/2020   Current use of long term anticoagulation 02/25/2020   Pulmonary nodules/lesions, multiple 02/25/2020   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   CAD in native artery 08/31/2016   Essential hypertension 08/31/2016   Dyslipidemia 08/31/2016   Family history of heart disease in male family member before age 88 08/31/2016   Hearing loss, bilateral 08/31/2016   Generalized anxiety disorder 08/31/2016   History of colonic polyps 08/31/2016   Past Medical History:  Diagnosis Date   Arthritis    CAD S/P percutaneous coronary angioplasty 2011   stents x 3   Generalized anxiety disorder    Hard of hearing    Hyperlipidemia    Hypertension    Lower GI bleed 2014   colonscopy was normal    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    Family History  Problem Relation Age of Onset   Heart disease Mother 8       heart attack   Lung disease Father        asbestosis   Clotting disorder Father    Asthma Daughter    Hyperlipidemia Daughter    Alcohol abuse Paternal Uncle    Cancer Paternal Grandfather        lung    Past Surgical History:  Procedure Laterality Date   CORONARY ANGIOPLASTY WITH STENT PLACEMENT     ENDOVENOUS ABLATION SAPHENOUS VEIN W/ LASER Left 06/02/2022   endovenous laser ablation left greater saphenous vein by Angela Kell MD   FRACTURE SURGERY  1995   right elbow   KNEE ARTHROSCOPY W/ MENISCAL REPAIR     Social History   Occupational History   Occupation: retired    Comment: packing/shipping  Tobacco Use   Smoking  status: Former    Current packs/day: 0.00    Average packs/day: 0.3 packs/day for 3.0 years (0.9 ttl pk-yrs)    Types: Cigarettes    Start date: 08/31/1966    Quit date: 08/31/1969    Years since quitting: 54.6   Smokeless tobacco: Never  Vaping Use   Vaping status: Never Used  Substance and Sexual Activity   Alcohol use: Yes    Alcohol/week: 3.0 standard drinks of alcohol    Types: 2 Cans of beer, 1 Shots of liquor per week    Comment: occassionally   Drug use: No   Sexual activity: Yes

## 2024-04-01 ENCOUNTER — Encounter (HOSPITAL_COMMUNITY): Payer: Self-pay

## 2024-04-01 ENCOUNTER — Ambulatory Visit (HOSPITAL_COMMUNITY): Attending: Orthopedic Surgery

## 2024-04-01 ENCOUNTER — Other Ambulatory Visit: Payer: Self-pay

## 2024-04-01 DIAGNOSIS — Z7409 Other reduced mobility: Secondary | ICD-10-CM | POA: Diagnosis present

## 2024-04-01 DIAGNOSIS — G8929 Other chronic pain: Secondary | ICD-10-CM | POA: Diagnosis present

## 2024-04-01 DIAGNOSIS — M1712 Unilateral primary osteoarthritis, left knee: Secondary | ICD-10-CM | POA: Insufficient documentation

## 2024-04-01 DIAGNOSIS — M1711 Unilateral primary osteoarthritis, right knee: Secondary | ICD-10-CM | POA: Diagnosis not present

## 2024-04-01 DIAGNOSIS — M25561 Pain in right knee: Secondary | ICD-10-CM | POA: Insufficient documentation

## 2024-04-01 DIAGNOSIS — M25562 Pain in left knee: Secondary | ICD-10-CM | POA: Diagnosis present

## 2024-04-01 NOTE — Therapy (Signed)
 OUTPATIENT PHYSICAL THERAPY LOWER EXTREMITY EVALUATION   Patient Name: [name] MRN: [id number] DOB:[birth year], 78 y.o., male Today's Date: 04/01/2024  END OF SESSION:  PT End of Session - 04/01/24 0928     Visit Number 1    Number of Visits 13    Date for PT Re-Evaluation 05/13/24    Authorization Type Medicare A & B    Progress Note Due on Visit 10    PT Start Time 0930    PT Stop Time 1011    PT Time Calculation (min) 41 min    Activity Tolerance Patient tolerated treatment well    Behavior During Therapy [facility] for tasks assessed/performed          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   Past Surgical History:  Procedure Laterality Date   CORONARY ANGIOPLASTY WITH STENT PLACEMENT     ENDOVENOUS ABLATION SAPHENOUS VEIN W/ LASER Left 06/02/2022   endovenous laser ablation left greater saphenous vein by Penne Colorado MD   FRACTURE SURGERY  1995   right elbow   KNEE ARTHROSCOPY W/ MENISCAL REPAIR     Patient Active Problem List   Diagnosis Date Noted   Chronic pain of both knees 02/14/2024   Morbid obesity (HCC) 02/15/2023   Prediabetes 02/15/2023   History of pulmonary embolism 02/25/2020   Current use of long term anticoagulation 02/25/2020   Pulmonary nodules/lesions, multiple 02/25/2020   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   CAD in native artery 08/31/2016   Essential hypertension 08/31/2016   Dyslipidemia 08/31/2016   Family history of heart disease in male family member before age 31 08/31/2016   Hearing loss, bilateral 08/31/2016   Generalized anxiety disorder 08/31/2016   History of colonic polyps 08/31/2016    PCP: Purcell Emil Schanz, MD  REFERRING PROVIDER: Margrette Taft BRAVO,  MD  REFERRING DIAG:  M25.561,M25.562,G89.29 (ICD-10-CM) - Chronic pain of both knees  M17.11 (ICD-10-CM) - Primary osteoarthritis of right knee  M17.12 (ICD-10-CM) - Primary osteoarthritis of left knee    THERAPY DIAG:  Chronic pain of both knees  Impaired functional mobility, balance, gait, and endurance  Rationale for Evaluation and Treatment: Rehabilitation  ONSET DATE: 20 years  SUBJECTIVE:   SUBJECTIVE STATEMENT: Pt is very HOH, and lip reads. I have arthritis in both knees.  Pt played a lot of sports in younger years and he thinks this is the cause of his arthritic changes in knees. Pt reports trouble getting out of his chair and from bed. Pt reports trouble standing up and get on/of with toilet even with assistance toilet riser. Pt has purchased assistive equipments to assist with transfers at home. Biggest problem he has are negotiating stairs and falling during walking in the yard. Pt with recent cortisone shots last week. Pt had has found relief from these shots and noticed improvement with stair negotiation.   PERTINENT HISTORY: Hx of PE PAIN:  Are you having pain? Yes: NPRS scale: 2-3/10 Pain location: bilateral knees Pain description: throbbing Aggravating factors: storms, ambulating, transfers Relieving factors: Cortisone shots, heat, and pain meds  PRECAUTIONS: None  RED FLAGS: None   WEIGHT BEARING RESTRICTIONS: No  FALLS:  Has  patient fallen in last 6 months? Yes. Number of falls 1 forward and rolled   PATIENT GOALS: I want painless movement, but I know that isn't possible. I don't want to be a burden to my family.   OBJECTIVE:  Note: Objective measures were completed at Evaluation unless otherwise noted.  DIAGNOSTIC FINDINGS:   PATIENT SURVEYS:  Knee injury and Osteoarthritis Outcome Score-12 (KOOS-12): 50/100= 50%  COGNITION: Overall cognitive status: Within functional limits for tasks assessed     SENSATION: WFL   POSTURE: decreased  lumbar lordosis, flexed trunk , and wide base of support, ER of BLE.   PALPATION: No deformities, non tender to palpate  LOWER EXTREMITY ROM:  Active ROM Right eval Left eval  Hip flexion    Hip extension    Hip abduction    Hip adduction    Hip internal rotation    Hip external rotation    Knee flexion 113 120  Knee extension -5 -5  Ankle dorsiflexion    Ankle plantarflexion    Ankle inversion    Ankle eversion     (Blank rows = not tested)  LOWER EXTREMITY MMT:  MMT Right eval Left eval  Hip flexion 4- 3+  Hip extension 4-* 4-*  Hip abduction 4- 4-  Hip adduction    Hip internal rotation    Hip external rotation    Knee flexion    Knee extension    Ankle dorsiflexion    Ankle plantarflexion    Ankle inversion    Ankle eversion     (Blank rows = not tested)  FUNCTIONAL TESTS:  30 Second Chair Stand Test: 9x  Norms:   Age 80-64 53-69 70-74 75-79 38-84 85-89 90-94  Women 15 15 14 13 12 11 9   Men 17 16 15 14 13 11 9     L SLS: 0.75 seconds  R SLS: 0.60 seconds  Norms: 18-39  F: 43.5 seconds  M: 43.2 seconds 40-49  F: 40.4 seconds  M: 40.1 seconds 50-59  F: 36 seconds  M: 38.1 seconds 60-69  F: 25.1 seconds  M: 28.7 seconds 70-79  F: 11.3 seconds  M: 18.3 seconds  GAIT: Distance walked: 27ft Assistive device utilized: Single point cane Level of assistance: Complete Independence and Modified independence Comments: Antalgic with SPC intermittent touch down. BLE externally rotated and lateral trunk shifts to assist with BLE clerance.                                                                                                                                 TREATMENT DATE:   04/01/2024 PT Evaluation  Supine bridge with RTB at knees x 12 with 3'' isometric Clamshells with RTB at knees x 12 with 3'' isometric Sit to stands without UE support from elevated mat table   PATIENT EDUCATION:  Education details: PT Evaluation, findings,  prognosis, frequency, attendance policy, and HEP. Person educated: Patient Education method: Explanation and  Demonstration Education comprehension: verbalized understanding  HOME EXERCISE PROGRAM: Access Code: [email or web address] URL: [email or web address] Date: 04/01/2024 Prepared by: Omega Bottcher  Exercises - Clamshell with Resistance  - 1 x daily - 7 x weekly - 3 sets - 10 reps - Supine Bridge with Resistance Band  - 1 x daily - 7 x weekly - 3 sets - 10 reps - Sit to Stand Without Arm Support  - 1 x daily - 7 x weekly - 3 sets - 10 reps - Single Leg Stance with Support  - 1 x daily - 7 x weekly - 3 sets - 10 reps  ASSESSMENT:  CLINICAL IMPRESSION: Patient is a 78 y.o. male who was seen today for physical therapy evaluation and treatment for  M25.561,M25.562,G89.29 (ICD-10-CM) - Chronic pain of both knees  M17.11 (ICD-10-CM) - Primary osteoarthritis of right knee  M17.12 (ICD-10-CM) - Primary osteoarthritis of left knee    Pt with general primary osteoarthritis in bilateral knees. Demonstrating bilateral range of motion limitation, BLE muscle weakness, and balance impairments which are limiting pt's functional mobility, ambulation capacity, and safety during functional mobility. Pt will benefit from skilled Physical Therapy services to address deficits/limitations in order to improve functional and QOL.    OBJECTIVE IMPAIRMENTS: Abnormal gait, decreased activity tolerance, decreased balance, decreased mobility, difficulty walking, decreased ROM, decreased strength, hypomobility, postural dysfunction, and pain.   ACTIVITY LIMITATIONS: carrying, bending, sitting, standing, squatting, stairs, transfers, bathing, toileting, and locomotion level  PARTICIPATION LIMITATIONS: meal prep, shopping, community activity, occupation, and yard work  PERSONAL FACTORS: Age are also affecting patient's functional outcome.   REHAB POTENTIAL: Good  CLINICAL DECISION MAKING:  Stable/uncomplicated  EVALUATION COMPLEXITY: Low   GOALS: Goals reviewed with patient? No  SHORT TERM GOALS: Target date: 04/22/24  Pt will be independent with HEP in order to demonstrate participation in Physical Therapy POC.  Baseline: Goal status: INITIAL  2.  Pt will 3/10 pain during mobility in order to demonstrate improved pain while performing ADLs.  Baseline:  Goal status: INITIAL  LONG TERM GOALS: Target date: 05/13/24  Pt will improve 30 Second Chair Stand Test by at least 3 reps in order to demonstrate improved functional strength to return to desired activities.  Baseline: see objective.  Goal status: INITIAL  2.  Pt will improve SL Balance by at least 5 seconds bilaterally in order to demonstrate improved functional ambulatory capacity in community setting.  Baseline: see objective.  Goal status: INITIAL  3.  Pt will improve KOOS-12 score by 20% in order to demonstrate improved pain with functional goals and outcomes. Baseline: see objective.  Goal status: INITIAL  4.  Pt will improve Bilateral knee ROM to 0-125 bilaterally in order to improve symmetry and reduce pain during functional activities.. Baseline: see objective.  Goal status: INITIAL  5.  Pt will improve BLE MMT  by at least 1/2 MMT in order to improve endurance, capacity and balance during functional activities.. Baseline: see objective.  Goal status: INITIAL   PLAN:  PT FREQUENCY: 2x/week  PT DURATION: 6 weeks  PLANNED INTERVENTIONS: 97164- PT Re-evaluation, 97750- Physical Performance Testing, 97110-Therapeutic exercises, 97530- Therapeutic activity, [id number]- Neuromuscular re-education, 97535- Self Care, 02859- Manual therapy, [id number]- Gait training, [phone]- Electrical stimulation (unattended), [id number]- Electrical stimulation (manual), and Balance training  PLAN FOR NEXT SESSION: BLE MMT, knee ROM, balance, functional strengthening, backwards walking. Educate pt on aquatic interventions.   Omega JONETTA Bottcher PT, DPT [facility] [phone] office   Omega JONETTA Bottcher,  PT 04/01/2024, 10:14 AM

## 2024-04-08 ENCOUNTER — Ambulatory Visit (HOSPITAL_COMMUNITY)

## 2024-04-08 ENCOUNTER — Encounter (HOSPITAL_COMMUNITY): Payer: Self-pay

## 2024-04-08 DIAGNOSIS — M25561 Pain in right knee: Secondary | ICD-10-CM | POA: Diagnosis not present

## 2024-04-08 DIAGNOSIS — Z7409 Other reduced mobility: Secondary | ICD-10-CM

## 2024-04-08 DIAGNOSIS — G8929 Other chronic pain: Secondary | ICD-10-CM

## 2024-04-08 NOTE — Therapy (Addendum)
 OUTPATIENT PHYSICAL THERAPY LOWER EXTREMITY EVALUATION   Patient Name: [name] MRN: [id number] DOB:1946-01-13, 78 y.o., male Today's Date: 04/08/2024  END OF SESSION:   04/08/24 1017  PT Visits / Re-Eval  Visit Number 2  Number of Visits 13  Date for PT Re-Evaluation 05/13/24  Authorization  Authorization Type Medicare A & B  Progress Note Due on Visit 10  PT Time Calculation  PT Start Time 1019  PT Stop Time 1102  PT Time Calculation (min) 43 min  PT - End of Session  Activity Tolerance Patient tolerated treatment well  Behavior During Therapy [facility] for tasks assessed/performed    PT End of Session - 04/08/24 1017     Visit Number 2    Number of Visits 13    Date for PT Re-Evaluation 05/13/24    Authorization Type Medicare A & B    Progress Note Due on Visit 10          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   Past Surgical History:  Procedure Laterality Date   CORONARY ANGIOPLASTY WITH STENT PLACEMENT     ENDOVENOUS ABLATION SAPHENOUS VEIN W/ LASER Left 06/02/2022   endovenous laser ablation left greater saphenous vein by Penne Colorado MD   FRACTURE SURGERY  1995   right elbow   KNEE ARTHROSCOPY W/ MENISCAL REPAIR     Patient Active Problem List   Diagnosis Date Noted   Chronic pain of both knees 02/14/2024   Morbid obesity (HCC) 02/15/2023   Prediabetes 02/15/2023   History of pulmonary embolism 02/25/2020   Current use of long term anticoagulation 02/25/2020   Pulmonary nodules/lesions, multiple 02/25/2020   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   CAD in native artery 08/31/2016   Essential hypertension 08/31/2016   Dyslipidemia 08/31/2016   Family history of heart disease in male  family member before age 38 08/31/2016   Hearing loss, bilateral 08/31/2016   Generalized anxiety disorder 08/31/2016   History of colonic polyps 08/31/2016    PCP: Purcell Emil Schanz, MD  REFERRING PROVIDER: Margrette Taft BRAVO, MD  REFERRING DIAG:  M25.561,M25.562,G89.29 (ICD-10-CM) - Chronic pain of both knees  M17.11 (ICD-10-CM) - Primary osteoarthritis of right knee  M17.12 (ICD-10-CM) - Primary osteoarthritis of left knee    THERAPY DIAG:  Chronic pain of both knees  Impaired functional mobility, balance, gait, and endurance  Rationale for Evaluation and Treatment: Rehabilitation  ONSET DATE: 20 years  SUBJECTIVE:   SUBJECTIVE STATEMENT: 04/08/24:  Pt stated he's hanging in there, stated his knee can tell the weather, stated he can tell a storm is coming.  Current pain scale 6-7/10, Bil knee pain throbbing pain today.  Stated he was able to complete all exercises with some cramping.  Eval:  Pt is very HOH, and lip reads. I have arthritis in both knees.  Pt played a lot of sports in younger years and he thinks this is the cause of his arthritic changes in knees. Pt reports trouble getting out of his chair and from bed. Pt reports trouble standing up and get on/of with toilet even with assistance toilet riser. Pt has purchased assistive equipments to assist with transfers  at home. Biggest problem he has are negotiating stairs and falling during walking in the yard. Pt with recent cortisone shots last week. Pt had has found relief from these shots and noticed improvement with stair negotiation.   PERTINENT HISTORY: Hx of PE PAIN:  Are you having pain? Yes: NPRS scale: 2-3/10 Pain location: bilateral knees Pain description: throbbing Aggravating factors: storms, ambulating, transfers Relieving factors: Cortisone shots, heat, and pain meds  PRECAUTIONS: None  RED FLAGS: None   WEIGHT BEARING RESTRICTIONS: No  FALLS:  Has patient fallen in last 6 months? Yes.  Number of falls 1 forward and rolled   PATIENT GOALS: I want painless movement, but I know that isn't possible. I don't want to be a burden to my family.   OBJECTIVE:  Note: Objective measures were completed at Evaluation unless otherwise noted.  DIAGNOSTIC FINDINGS:   PATIENT SURVEYS:  Knee injury and Osteoarthritis Outcome Score-12 (KOOS-12): 50/100= 50%  COGNITION: Overall cognitive status: Within functional limits for tasks assessed     SENSATION: WFL   POSTURE: decreased lumbar lordosis, flexed trunk , and wide base of support, ER of BLE.   PALPATION: No deformities, non tender to palpate  LOWER EXTREMITY ROM:  Active ROM Right eval Left eval  Hip flexion    Hip extension    Hip abduction    Hip adduction    Hip internal rotation    Hip external rotation    Knee flexion 113 120  Knee extension -5 -5  Ankle dorsiflexion    Ankle plantarflexion    Ankle inversion    Ankle eversion     (Blank rows = not tested)  LOWER EXTREMITY MMT:  MMT Right eval Left eval  Hip flexion 4- 3+  Hip extension 4-* 4-*  Hip abduction 4- 4-  Hip adduction    Hip internal rotation    Hip external rotation    Knee flexion    Knee extension    Ankle dorsiflexion    Ankle plantarflexion    Ankle inversion    Ankle eversion     (Blank rows = not tested)  FUNCTIONAL TESTS:  30 Second Chair Stand Test: 9x  Norms:   Age 63-64 63-69 70-74 75-79 85-84 85-89 90-94  Women 15 15 14 13 12 11 9   Men 17 16 15 14 13 11 9     L SLS: 0.75 seconds  R SLS: 0.60 seconds  Norms: 18-39  F: 43.5 seconds  M: 43.2 seconds 40-49  F: 40.4 seconds  M: 40.1 seconds 50-59  F: 36 seconds  M: 38.1 seconds 60-69  F: 25.1 seconds  M: 28.7 seconds 70-79  F: 11.3 seconds  M: 18.3 seconds  GAIT: Distance walked: 78ft Assistive device utilized: Single point cane Level of assistance: Complete Independence and Modified independence Comments: Antalgic with SPC intermittent touch  down. BLE externally rotated and lateral trunk shifts to assist with BLE clerance.  TREATMENT DATE:  04/08/24:  Reviewed goals Educated importance of HEP compliance for maximal beneftis Seated:  STS with elevated height using blue foam Supine:  Bridge 10x 5 with RTB around thigh  SAQ 10x 5 Sidelying:  Clam with RTB around thigh 10x 5 Standing:  SLS LT 7 Rt 5 max of 3 Nustep trail run [place] UE/LE x 5' SPM 65 or greater  04/01/2024 PT Evaluation  Supine bridge with RTB at knees x 12 with 3'' isometric Clamshells with RTB at knees x 12 with 3'' isometric Sit to stands without UE support from elevated mat table   PATIENT EDUCATION:  Education details: PT Evaluation, findings, prognosis, frequency, attendance policy, and HEP. Person educated: Patient Education method: Medical illustrator Education comprehension: verbalized understanding  HOME EXERCISE PROGRAM: Access Code: [email or web address] URL: [email or web address] Date: 04/01/2024 Prepared by: Omega Bottcher  Exercises - Clamshell with Resistance  - 1 x daily - 7 x weekly - 3 sets - 10 reps - Supine Bridge with Resistance Band  - 1 x daily - 7 x weekly - 3 sets - 10 reps - Sit to Stand Without Arm Support  - 1 x daily - 7 x weekly - 3 sets - 10 reps - Single Leg Stance with Support  - 1 x daily - 7 x weekly - 3 sets - 10 reps  ASSESSMENT:  CLINICAL IMPRESSION: 04/08/24: Reviewed goals and educated importance of HEP, pt able to recall and demonstrate appropriate form and mechanics with current exercise program.  Reports of increased pain Rt knee during STS, elevated height with improved tolerance and no HHA required.  Session focus with hip strengthening and knee mobility.  Pt limited by pain through session, monitored through session.  Educated benefits with aquatic exercises  for pain control.  Added quad strengthening to address extension lag, static balance activities with intermittent HHA required for LOB and cueing for posture to assist and Nustep for dynamic warm to assist with knee mobility.  No reports of increased pain through session.    Eval:  Patient is a 78 y.o. male who was seen today for physical therapy evaluation and treatment for  M25.561,M25.562,G89.29 (ICD-10-CM) - Chronic pain of both knees  M17.11 (ICD-10-CM) - Primary osteoarthritis of right knee  M17.12 (ICD-10-CM) - Primary osteoarthritis of left knee    Pt with general primary osteoarthritis in bilateral knees. Demonstrating bilateral range of motion limitation, BLE muscle weakness, and balance impairments which are limiting pt's functional mobility, ambulation capacity, and safety during functional mobility. Pt will benefit from skilled Physical Therapy services to address deficits/limitations in order to improve functional and QOL.    OBJECTIVE IMPAIRMENTS: Abnormal gait, decreased activity tolerance, decreased balance, decreased mobility, difficulty walking, decreased ROM, decreased strength, hypomobility, postural dysfunction, and pain.   ACTIVITY LIMITATIONS: carrying, bending, sitting, standing, squatting, stairs, transfers, bathing, toileting, and locomotion level  PARTICIPATION LIMITATIONS: meal prep, shopping, community activity, occupation, and yard work  PERSONAL FACTORS: Age are also affecting patient's functional outcome.   REHAB POTENTIAL: Good  CLINICAL DECISION MAKING: Stable/uncomplicated  EVALUATION COMPLEXITY: Low   GOALS: Goals reviewed with patient? No  SHORT TERM GOALS: Target date: 04/22/24  Pt will be independent with HEP in order to demonstrate participation in Physical Therapy POC.  Baseline: Goal status: INITIAL  2.  Pt will 3/10 pain during mobility in order to demonstrate improved pain while performing ADLs.  Baseline:  Goal status: INITIAL  LONG  TERM GOALS: Target date: 05/13/24  Pt will improve 30 Second  Chair Stand Test by at least 3 reps in order to demonstrate improved functional strength to return to desired activities.  Baseline: see objective.  Goal status: INITIAL  2.  Pt will improve SL Balance by at least 5 seconds bilaterally in order to demonstrate improved functional ambulatory capacity in community setting.  Baseline: see objective.  Goal status: INITIAL  3.  Pt will improve KOOS-12 score by 20% in order to demonstrate improved pain with functional goals and outcomes. Baseline: see objective.  Goal status: INITIAL  4.  Pt will improve Bilateral knee ROM to 0-125 bilaterally in order to improve symmetry and reduce pain during functional activities.. Baseline: see objective.  Goal status: INITIAL  5.  Pt will improve BLE MMT  by at least 1/2 MMT in order to improve endurance, capacity and balance during functional activities.. Baseline: see objective.  Goal status: INITIAL   PLAN:  PT FREQUENCY: 2x/week  PT DURATION: 6 weeks  PLANNED INTERVENTIONS: 97164- PT Re-evaluation, 97750- Physical Performance Testing, 97110-Therapeutic exercises, 97530- Therapeutic activity, [id number]- Neuromuscular re-education, 97535- Self Care, 02859- Manual therapy, [id number]- Gait training, [phone]- Electrical stimulation (unattended), [phone]- Electrical stimulation (manual), and Balance training  PLAN FOR NEXT SESSION: Hip and knee strengthening, ROM, balance, functional strengthening, backwards walking. Educate pt on aquatic interventions.   Augustin Mclean, LPTA/CLT; CBIS [phone]   Mclean Augustin Amble, PTA 04/08/2024, 10:17 AM

## 2024-04-24 ENCOUNTER — Ambulatory Visit (HOSPITAL_COMMUNITY): Attending: Orthopedic Surgery | Admitting: Physical Therapy

## 2024-04-24 DIAGNOSIS — M25561 Pain in right knee: Secondary | ICD-10-CM | POA: Insufficient documentation

## 2024-04-24 DIAGNOSIS — M25562 Pain in left knee: Secondary | ICD-10-CM | POA: Diagnosis present

## 2024-04-24 DIAGNOSIS — Z7409 Other reduced mobility: Secondary | ICD-10-CM | POA: Insufficient documentation

## 2024-04-24 DIAGNOSIS — G8929 Other chronic pain: Secondary | ICD-10-CM | POA: Insufficient documentation

## 2024-04-24 NOTE — Therapy (Signed)
 OUTPATIENT PHYSICAL THERAPY LOWER EXTREMITY TREATMENT   Patient Name: [name] MRN: [id number] DOB:28-May-1946, 78 y.o., male Today's Date: 04/24/2024  END OF SESSION:  PT End of Session - 04/24/24 0932     Visit Number 3    Number of Visits 13    Date for PT Re-Evaluation 05/13/24    Authorization Type Medicare A & B    Progress Note Due on Visit 10    PT Start Time 0854    PT Stop Time 0935    PT Time Calculation (min) 41 min    Activity Tolerance Patient tolerated treatment well    Behavior During Therapy 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   Past Surgical History:  Procedure Laterality Date   CORONARY ANGIOPLASTY WITH STENT PLACEMENT     ENDOVENOUS ABLATION SAPHENOUS VEIN W/ LASER Left 06/02/2022   endovenous laser ablation left greater saphenous vein by Penne Colorado MD   FRACTURE SURGERY  1995   right elbow   KNEE ARTHROSCOPY W/ MENISCAL REPAIR     Patient Active Problem List   Diagnosis Date Noted   Chronic pain of both knees 02/14/2024   Morbid obesity (HCC) 02/15/2023   Prediabetes 02/15/2023   History of pulmonary embolism 02/25/2020   Current use of long term anticoagulation 02/25/2020   Pulmonary nodules/lesions, multiple 02/25/2020   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   CAD in native artery 08/31/2016   Essential hypertension 08/31/2016   Dyslipidemia 08/31/2016   Family history of heart disease in male family member before age 105 08/31/2016   Hearing loss, bilateral 08/31/2016   Generalized anxiety disorder 08/31/2016   History of colonic polyps 08/31/2016    PCP: Purcell Emil Schanz, MD  REFERRING PROVIDER: Margrette Taft BRAVO,  MD  REFERRING DIAG:  M25.561,M25.562,G89.29 (ICD-10-CM) - Chronic pain of both knees  M17.11 (ICD-10-CM) - Primary osteoarthritis of right knee  M17.12 (ICD-10-CM) - Primary osteoarthritis of left knee    THERAPY DIAG:  Chronic pain of both knees  Impaired functional mobility, balance, gait, and endurance  Rationale for Evaluation and Treatment: Rehabilitation  ONSET DATE: 20 years  SUBJECTIVE:   SUBJECTIVE STATEMENT: 04/24/24:  Pt states he hasn't been here in a couple weeks due to no appts available.  States his knees are hurting and thinks it's due to the weather, daily storms, we are getting. Current pain scale 9/10.  States the standing exercises are very painful for him to do.  Reports compliance with other exercises.   Eval:  Pt is very HOH, and lip reads. I have arthritis in both knees.  Pt played a lot of sports in younger years and he thinks this is the cause of his arthritic changes in knees. Pt reports trouble getting out of his chair and from bed. Pt reports trouble standing up and get on/of with toilet even with assistance toilet riser. Pt has purchased assistive equipments to assist with transfers at home. Biggest problem he has are negotiating stairs and falling during walking in the yard. Pt with recent cortisone shots last week. Pt had has found relief from  these shots and noticed improvement with stair negotiation.   PERTINENT HISTORY: Hx of PE PAIN:  Are you having pain? Yes: NPRS scale: 9/10 Pain location: bilateral knees Pain description: throbbing Aggravating factors: storms, ambulating, transfers Relieving factors: Cortisone shots, heat, and pain meds  PRECAUTIONS: None  RED FLAGS: None   WEIGHT BEARING RESTRICTIONS: No  FALLS:  Has patient fallen in last 6 months? Yes. Number of falls 1 forward and rolled   PATIENT GOALS: I want painless movement, but I know that isn't possible. I don't want to be a burden to my family.   OBJECTIVE:  Note:  Objective measures were completed at Evaluation unless otherwise noted.  DIAGNOSTIC FINDINGS:   PATIENT SURVEYS:  Knee injury and Osteoarthritis Outcome Score-12 (KOOS-12): 50/100= 50%  COGNITION: Overall cognitive status: Within functional limits for tasks assessed     SENSATION: WFL   POSTURE: decreased lumbar lordosis, flexed trunk , and wide base of support, ER of BLE.   PALPATION: No deformities, non tender to palpate  LOWER EXTREMITY ROM:  Active ROM Right eval Left eval  Hip flexion    Hip extension    Hip abduction    Hip adduction    Hip internal rotation    Hip external rotation    Knee flexion 113 120  Knee extension -5 -5  Ankle dorsiflexion    Ankle plantarflexion    Ankle inversion    Ankle eversion     (Blank rows = not tested)  LOWER EXTREMITY MMT:  MMT Right eval Left eval  Hip flexion 4- 3+  Hip extension 4-* 4-*  Hip abduction 4- 4-  Hip adduction    Hip internal rotation    Hip external rotation    Knee flexion    Knee extension    Ankle dorsiflexion    Ankle plantarflexion    Ankle inversion    Ankle eversion     (Blank rows = not tested)  FUNCTIONAL TESTS:  30 Second Chair Stand Test: 9x  Norms:   Age 72-64 62-69 70-74 75-79 53-84 85-89 90-94  Women 15 15 14 13 12 11 9   Men 17 16 15 14 13 11 9     L SLS: 0.75 seconds  R SLS: 0.60 seconds  Norms: 18-39  F: 43.5 seconds  M: 43.2 seconds 40-49  F: 40.4 seconds  M: 40.1 seconds 50-59  F: 36 seconds  M: 38.1 seconds 60-69  F: 25.1 seconds  M: 28.7 seconds 70-79  F: 11.3 seconds  M: 18.3 seconds  GAIT: Distance walked: 24ft Assistive device utilized: Single point cane Level of assistance: Complete Independence and Modified independence Comments: Antalgic with SPC intermittent touch down. BLE externally rotated and lateral trunk shifts to assist with BLE clerance.  TREATMENT DATE:  04/24/24 Nustep seat 14 level 4 [place] UE/LE 5 minutes Seated:  sit to stands with blue foam in chair 2X10  Long sitting hamstring stretch 1 minute each side Supine: bridge 10X  Bridge RTB abduction with hold 7X (unable to complete more due to Rt HS cramping)  SLR 10X each  Clams RTB 10X each  Hamstring stretch Sidelying clams 10X3 each with RTB each   04/08/24:  Reviewed goals Educated importance of HEP compliance for maximal beneftis Seated:  STS with elevated height using blue foam Supine:  Bridge 10x 5 with RTB around thigh  SAQ 10x 5 Sidelying:  Clam with RTB around thigh 10x 5 Standing:  SLS LT 7 Rt 5 max of 3 Nustep trail run [place] UE/LE x 5' SPM 65 or greater  04/01/2024 PT Evaluation  Supine bridge with RTB at knees x 12 with 3'' isometric Clamshells with RTB at knees x 12 with 3'' isometric Sit to stands without UE support from elevated mat table   PATIENT EDUCATION:  Education details: PT Evaluation, findings, prognosis, frequency, attendance policy, and HEP. Person educated: Patient Education method: Medical illustrator Education comprehension: verbalized understanding  HOME EXERCISE PROGRAM: Access Code: [email or web address] URL: [email or web address] Date: 04/01/2024 Prepared by: Omega Bottcher  Exercises - Clamshell with Resistance  - 1 x daily - 7 x weekly - 3 sets - 10 reps - Supine Bridge with Resistance Band  - 1 x daily - 7 x weekly - 3 sets - 10 reps - Sit to Stand Without Arm Support  - 1 x daily - 7 x weekly - 3 sets - 10 reps - Single Leg Stance with Support  - 1 x daily - 7 x weekly - 3 sets - 10 reps  ASSESSMENT:  CLINICAL IMPRESSION: 04/24/24: continued with focus on improving LE strength this session.  Therex mainly completed NWB as reports increased pain/discomfort with all standing activities.  Pt with cramping in Rt hamstring with bridge/abduction activity and only  able to complete 7 repetitions.  Hamstring stretch added in long sitting with visible tightness; inability to sit fully straight due to tightness. Did continue with sit to stands with foam added for additional 2 lift.  Minimal complaints of pain throughout session. Informed of aquatics at [facility] and silver sneaker program there.  Pt reports he may check into this.     Eval:  Patient is a 78 y.o. male who was seen today for physical therapy evaluation and treatment for  M25.561,M25.562,G89.29 (ICD-10-CM) - Chronic pain of both knees  M17.11 (ICD-10-CM) - Primary osteoarthritis of right knee  M17.12 (ICD-10-CM) - Primary osteoarthritis of left knee    Pt with general primary osteoarthritis in bilateral knees. Demonstrating bilateral range of motion limitation, BLE muscle weakness, and balance impairments which are limiting pt's functional mobility, ambulation capacity, and safety during functional mobility. Pt will benefit from skilled Physical Therapy services to address deficits/limitations in order to improve functional and QOL.    OBJECTIVE IMPAIRMENTS: Abnormal gait, decreased activity tolerance, decreased balance, decreased mobility, difficulty walking, decreased ROM, decreased strength, hypomobility, postural dysfunction, and pain.   ACTIVITY LIMITATIONS: carrying, bending, sitting, standing, squatting, stairs, transfers, bathing, toileting, and locomotion level  PARTICIPATION LIMITATIONS: meal prep, shopping, community activity, occupation, and yard work  PERSONAL FACTORS: Age are also affecting patient's functional outcome.   REHAB POTENTIAL: Good  CLINICAL DECISION MAKING: Stable/uncomplicated  EVALUATION COMPLEXITY: Low   GOALS: Goals reviewed with patient? No  SHORT TERM GOALS: Target date: 04/22/24  Pt will be independent with HEP in order to demonstrate participation in Physical Therapy POC.  Baseline: Goal status: INITIAL  2.  Pt will 3/10 pain during mobility in order to  demonstrate improved pain while performing ADLs.  Baseline:  Goal status: INITIAL  LONG TERM GOALS: Target date: 05/13/24  Pt will improve 30 Second Chair Stand Test by at least 3 reps in order to demonstrate improved functional strength to return to desired activities.  Baseline: see objective.  Goal status: INITIAL  2.  Pt will improve SL Balance by at least 5 seconds bilaterally in order to demonstrate improved functional ambulatory capacity in community setting.  Baseline: see objective.  Goal status: INITIAL  3.  Pt will improve KOOS-12 score by 20% in order to demonstrate improved pain with functional goals and outcomes. Baseline: see objective.  Goal status: INITIAL  4.  Pt will improve Bilateral knee ROM to 0-125 bilaterally in order to improve symmetry and reduce pain during functional activities.. Baseline: see objective.  Goal status: INITIAL  5.  Pt will improve BLE MMT  by at least 1/2 MMT in order to improve endurance, capacity and balance during functional activities.. Baseline: see objective.  Goal status: INITIAL   PLAN:  PT FREQUENCY: 2x/week  PT DURATION: 6 weeks  PLANNED INTERVENTIONS: 97164- PT Re-evaluation, 97750- Physical Performance Testing, 97110-Therapeutic exercises, 97530- Therapeutic activity, [id number]- Neuromuscular re-education, 97535- Self Care, 02859- Manual therapy, [id number]- Gait training, [phone]- Electrical stimulation (unattended), [phone]- Electrical stimulation (manual), and Balance training  PLAN FOR NEXT SESSION: Hip and knee strengthening with progression to standing balance, functional strengthening.  Next session begin hamstring curls.  Update HEP next session.    Greig KATHEE Fuse, PTA/CLT [facility] Ph: [phone]   Fuse Greig KATHEE, PTA 04/24/2024, 12:49 PM

## 2024-04-26 ENCOUNTER — Ambulatory Visit (HOSPITAL_COMMUNITY)

## 2024-04-26 ENCOUNTER — Encounter (HOSPITAL_COMMUNITY): Payer: Self-pay

## 2024-04-26 DIAGNOSIS — G8929 Other chronic pain: Secondary | ICD-10-CM

## 2024-04-26 DIAGNOSIS — M25561 Pain in right knee: Secondary | ICD-10-CM | POA: Diagnosis not present

## 2024-04-26 DIAGNOSIS — Z7409 Other reduced mobility: Secondary | ICD-10-CM

## 2024-04-26 NOTE — Therapy (Signed)
 OUTPATIENT PHYSICAL THERAPY LOWER EXTREMITY TREATMENT   Patient Name: Kolden Dupee MRN: [id number] DOB:1946-04-26, 78 y.o., male Today's Date: 04/26/2024  END OF SESSION:  PT End of Session - 04/26/24 0844     Visit Number 4    Number of Visits 13    Date for PT Re-Evaluation 05/13/24    Authorization Type Medicare A & B    Progress Note Due on Visit 10    PT Start Time 0847    PT Stop Time 0930    PT Time Calculation (min) 43 min    Activity Tolerance Patient tolerated treatment well    Behavior During Therapy [facility] for tasks assessed/performed           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   Past Surgical History:  Procedure Laterality Date   CORONARY ANGIOPLASTY WITH STENT PLACEMENT     ENDOVENOUS ABLATION SAPHENOUS VEIN W/ LASER Left 06/02/2022   endovenous laser ablation left greater saphenous vein by Penne Colorado MD   FRACTURE SURGERY  1995   right elbow   KNEE ARTHROSCOPY W/ MENISCAL REPAIR     Patient Active Problem List   Diagnosis Date Noted   Chronic pain of both knees 02/14/2024   Morbid obesity (HCC) 02/15/2023   Prediabetes 02/15/2023   History of pulmonary embolism 02/25/2020   Current use of long term anticoagulation 02/25/2020   Pulmonary nodules/lesions, multiple 02/25/2020   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   CAD in native artery 08/31/2016   Essential hypertension 08/31/2016   Dyslipidemia 08/31/2016   Family history of heart disease in male family member before age 20 08/31/2016   Hearing loss, bilateral 08/31/2016   Generalized anxiety disorder 08/31/2016   History of colonic polyps 08/31/2016    PCP: Purcell Emil Schanz, MD  REFERRING PROVIDER: Margrette Taft BRAVO,  MD  REFERRING DIAG:  M25.561,M25.562,G89.29 (ICD-10-CM) - Chronic pain of both knees  M17.11 (ICD-10-CM) - Primary osteoarthritis of right knee  M17.12 (ICD-10-CM) - Primary osteoarthritis of left knee    THERAPY DIAG:  Chronic pain of both knees  Impaired functional mobility, balance, gait, and endurance  Rationale for Evaluation and Treatment: Rehabilitation  ONSET DATE: 20 years  SUBJECTIVE:   SUBJECTIVE STATEMENT: 04/26/24:  Knees feel a little better compared to last session, stated he is unable to complete the standing balance exercise at home.    Eval:  Pt is very HOH, and lip reads. I have arthritis in both knees.  Pt played a lot of sports in younger years and he thinks this is the cause of his arthritic changes in knees. Pt reports trouble getting out of his chair and from bed. Pt reports trouble standing up and get on/of with toilet even with assistance toilet riser. Pt has purchased assistive equipments to assist with transfers at home. Biggest problem he has are negotiating stairs and falling during walking in the yard. Pt with recent cortisone shots last week. Pt had has found relief from these shots and noticed improvement with stair negotiation.   PERTINENT HISTORY: Hx of PE PAIN:  Are you having pain? Yes: NPRS scale: 7/10 Pain location: bilateral knees Pain description: throbbing Aggravating  factors: storms, ambulating, transfers Relieving factors: Cortisone shots, heat, and pain meds  PRECAUTIONS: None  RED FLAGS: None   WEIGHT BEARING RESTRICTIONS: No  FALLS:  Has patient fallen in last 6 months? Yes. Number of falls 1 forward and rolled   PATIENT GOALS: I want painless movement, but I know that isn't possible. I don't want to be a burden to my family.   OBJECTIVE:  Note: Objective measures were completed at Evaluation unless otherwise noted.  DIAGNOSTIC FINDINGS:   PATIENT SURVEYS:  Knee injury and Osteoarthritis Outcome Score-12 (KOOS-12):  50/100= 50%  COGNITION: Overall cognitive status: Within functional limits for tasks assessed     SENSATION: WFL   POSTURE: decreased lumbar lordosis, flexed trunk , and wide base of support, ER of BLE.   PALPATION: No deformities, non tender to palpate  LOWER EXTREMITY ROM:  Active ROM Right eval Left eval  Hip flexion    Hip extension    Hip abduction    Hip adduction    Hip internal rotation    Hip external rotation    Knee flexion 113 120  Knee extension -5 -5  Ankle dorsiflexion    Ankle plantarflexion    Ankle inversion    Ankle eversion     (Blank rows = not tested)  LOWER EXTREMITY MMT:  MMT Right eval Left eval  Hip flexion 4- 3+  Hip extension 4-* 4-*  Hip abduction 4- 4-  Hip adduction    Hip internal rotation    Hip external rotation    Knee flexion    Knee extension    Ankle dorsiflexion    Ankle plantarflexion    Ankle inversion    Ankle eversion     (Blank rows = not tested)  FUNCTIONAL TESTS:  30 Second Chair Stand Test: 9x  Norms:   Age 7-64 86-69 70-74 75-79 58-84 85-89 90-94  Women 15 15 14 13 12 11 9   Men 17 16 15 14 13 11 9     L SLS: 0.75 seconds  R SLS: 0.60 seconds  Norms: 18-39  F: 43.5 seconds  M: 43.2 seconds 40-49  F: 40.4 seconds  M: 40.1 seconds 50-59  F: 36 seconds  M: 38.1 seconds 60-69  F: 25.1 seconds  M: 28.7 seconds 70-79  F: 11.3 seconds  M: 18.3 seconds  GAIT: Distance walked: 57ft Assistive device utilized: Single point cane Level of assistance: Complete Independence and Modified independence Comments: Antalgic with SPC intermittent touch down. BLE externally rotated and lateral trunk shifts to assist with BLE clerance.                                                                                                                                 TREATMENT DATE:  04/26/24: Prone:  hamstring curls 3# 2 x 10   Quad stretch 2x 30 Sidelying:   Abduction 2x 10 Supine:   Bridge 2x 10 with  RTB around  thighs  SLR 2x 10 Seated: long sitting hamstring stretch 3x 30 Standing: sidestep RTB around thigh 2RT  Tandem stance 2x 30  04/24/24 Nustep seat 14 level 4 [place] UE/LE 5 minutes Seated:  sit to stands with blue foam in chair 2X10  Long sitting hamstring stretch 1 minute each side Supine: bridge 10X  Bridge RTB abduction with hold 7X (unable to complete more due to Rt HS cramping)  SLR 10X each  Clams RTB 10X each  Hamstring stretch Sidelying clams 10X3 each with RTB each   04/08/24:  Reviewed goals Educated importance of HEP compliance for maximal beneftis Seated:  STS with elevated height using blue foam Supine:  Bridge 10x 5 with RTB around thigh  SAQ 10x 5 Sidelying:  Clam with RTB around thigh 10x 5 Standing:  SLS LT 7 Rt 5 max of 3 Nustep trail run [place] UE/LE x 5' SPM 65 or greater  04/01/2024 PT Evaluation  Supine bridge with RTB at knees x 12 with 3'' isometric Clamshells with RTB at knees x 12 with 3'' isometric Sit to stands without UE support from elevated mat table   PATIENT EDUCATION:  Education details: PT Evaluation, findings, prognosis, frequency, attendance policy, and HEP. Person educated: Patient Education method: Medical illustrator Education comprehension: verbalized understanding  HOME EXERCISE PROGRAM: Access Code: [email or web address] URL: [email or web address] Date: 04/01/2024 Prepared by: Omega Bottcher  Exercises - Clamshell with Resistance  - 1 x daily - 7 x weekly - 3 sets - 10 reps - Supine Bridge with Resistance Band  - 1 x daily - 7 x weekly - 3 sets - 10 reps - Sit to Stand Without Arm Support  - 1 x daily - 7 x weekly - 3 sets - 10 reps - Single Leg Stance with Support  - 1 x daily - 7 x weekly - 3 sets - 10 reps  04/26/24:- Standing Tandem Balance with Counter Support  - 2 x daily - 7 x weekly - 1 sets - 3 reps - 30 hold  ASSESSMENT:  CLINICAL IMPRESSION: 04/26/24:  Added hamstring curls  for strengthening, able to add weights with good tolerance.  Quad stretch added for mobility as well.  Pt able to complete increased reps with decreased c/o cramping this session compared to last session.  PT reports increased pain with SLS activities, encouraged to stop this exercise.  Added sidestep for gluteal and balance training with implore tolerance.  Pt continues to be limited by pain, no reports of increased pain through session.  Added tandem stance to HEP with printout given, encouraged to stand near counter or sink for safety.    Eval:  Patient is a 78 y.o. male who was seen today for physical therapy evaluation and treatment for  M25.561,M25.562,G89.29 (ICD-10-CM) - Chronic pain of both knees  M17.11 (ICD-10-CM) - Primary osteoarthritis of right knee  M17.12 (ICD-10-CM) - Primary osteoarthritis of left knee    Pt with general primary osteoarthritis in bilateral knees. Demonstrating bilateral range of motion limitation, BLE muscle weakness, and balance impairments which are limiting pt's functional mobility, ambulation capacity, and safety during functional mobility. Pt will benefit from skilled Physical Therapy services to address deficits/limitations in order to improve functional and QOL.    OBJECTIVE IMPAIRMENTS: Abnormal gait, decreased activity tolerance, decreased balance, decreased mobility, difficulty walking, decreased ROM, decreased strength, hypomobility, postural dysfunction, and pain.   ACTIVITY LIMITATIONS: carrying, bending, sitting, standing, squatting, stairs, transfers, bathing, toileting, and locomotion level  PARTICIPATION LIMITATIONS: meal prep,  shopping, community activity, occupation, and yard work  PERSONAL FACTORS: Age are also affecting patient's functional outcome.   REHAB POTENTIAL: Good  CLINICAL DECISION MAKING: Stable/uncomplicated  EVALUATION COMPLEXITY: Low   GOALS: Goals reviewed with patient? No  SHORT TERM GOALS: Target date: 04/22/24  Pt  will be independent with HEP in order to demonstrate participation in Physical Therapy POC.  Baseline: Goal status: INITIAL  2.  Pt will 3/10 pain during mobility in order to demonstrate improved pain while performing ADLs.  Baseline:  Goal status: INITIAL  LONG TERM GOALS: Target date: 05/13/24  Pt will improve 30 Second Chair Stand Test by at least 3 reps in order to demonstrate improved functional strength to return to desired activities.  Baseline: see objective.  Goal status: INITIAL  2.  Pt will improve SL Balance by at least 5 seconds bilaterally in order to demonstrate improved functional ambulatory capacity in community setting.  Baseline: see objective.  Goal status: INITIAL  3.  Pt will improve KOOS-12 score by 20% in order to demonstrate improved pain with functional goals and outcomes. Baseline: see objective.  Goal status: INITIAL  4.  Pt will improve Bilateral knee ROM to 0-125 bilaterally in order to improve symmetry and reduce pain during functional activities.. Baseline: see objective.  Goal status: INITIAL  5.  Pt will improve BLE MMT  by at least 1/2 MMT in order to improve endurance, capacity and balance during functional activities.. Baseline: see objective.  Goal status: INITIAL   PLAN:  PT FREQUENCY: 2x/week  PT DURATION: 6 weeks  PLANNED INTERVENTIONS: 97164- PT Re-evaluation, 97750- Physical Performance Testing, 97110-Therapeutic exercises, 97530- Therapeutic activity, [id number]- Neuromuscular re-education, 97535- Self Care, 02859- Manual therapy, [id number]- Gait training, [phone]- Electrical stimulation (unattended), [phone]- Electrical stimulation (manual), and Balance training  PLAN FOR NEXT SESSION: Hip and knee strengthening with progression to standing balance, functional strengthening.  Progress to BLE balance activities.  Update HEP PRN.   Augustin Mclean, LPTA/CLT; CBIS [phone]  Mclean Augustin Amble, PTA 04/26/2024, 5:49 PM

## 2024-04-29 ENCOUNTER — Ambulatory Visit (HOSPITAL_COMMUNITY): Admitting: Physical Therapy

## 2024-04-29 DIAGNOSIS — G8929 Other chronic pain: Secondary | ICD-10-CM

## 2024-04-29 DIAGNOSIS — M25561 Pain in right knee: Secondary | ICD-10-CM | POA: Diagnosis not present

## 2024-04-29 DIAGNOSIS — Z7409 Other reduced mobility: Secondary | ICD-10-CM

## 2024-04-29 NOTE — Therapy (Signed)
 OUTPATIENT PHYSICAL THERAPY LOWER EXTREMITY TREATMENT   Patient Name: [name] MRN: [id number] DOB:1946-01-10, 78 y.o., male Today's Date: 04/29/2024  END OF SESSION:  PT End of Session - 04/29/24 1009     Visit Number 5    Number of Visits 13    Date for PT Re-Evaluation 05/13/24    Authorization Type Medicare A & B    Progress Note Due on Visit 10    PT Start Time 0932    PT Stop Time 1000    PT Time Calculation (min) 28 min    Activity Tolerance Patient tolerated treatment well    Behavior During Therapy [facility] for tasks assessed/performed            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   Past Surgical History:  Procedure Laterality Date   CORONARY ANGIOPLASTY WITH STENT PLACEMENT     ENDOVENOUS ABLATION SAPHENOUS VEIN W/ LASER Left 06/02/2022   endovenous laser ablation left greater saphenous vein by Penne Colorado MD   FRACTURE SURGERY  1995   right elbow   KNEE ARTHROSCOPY W/ MENISCAL REPAIR     Patient Active Problem List   Diagnosis Date Noted   Chronic pain of both knees 02/14/2024   Morbid obesity (HCC) 02/15/2023   Prediabetes 02/15/2023   History of pulmonary embolism 02/25/2020   Current use of long term anticoagulation 02/25/2020   Pulmonary nodules/lesions, multiple 02/25/2020   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   CAD in native artery 08/31/2016   Essential hypertension 08/31/2016   Dyslipidemia 08/31/2016   Family history of heart disease in male family member before age 6 08/31/2016   Hearing loss, bilateral 08/31/2016   Generalized anxiety disorder 08/31/2016   History of colonic polyps 08/31/2016    PCP: Purcell Emil Schanz, MD  REFERRING PROVIDER: Margrette Taft BRAVO, MD  REFERRING DIAG:  M25.561,M25.562,G89.29 (ICD-10-CM) - Chronic pain of both knees  M17.11 (ICD-10-CM) - Primary osteoarthritis of right knee  M17.12 (ICD-10-CM) - Primary osteoarthritis of left knee    THERAPY DIAG:  Chronic pain of both knees  Impaired functional mobility, balance, gait, and endurance  Rationale for Evaluation and Treatment: Rehabilitation  ONSET DATE: 20 years  SUBJECTIVE:   SUBJECTIVE STATEMENT: Pt comes today and states he woke up with a lot more pain, popping, edema and inability to bend the Rt knee well.  States this has happened before and usually gets better in a couple days.  Currently 10/10 Rt knee, 4/10 Lt knee but denies need to go to ED. States was going to cancel appt as he doesn't feel he can do much today with his pain.     Eval:  Pt is very HOH, and lip reads. I have arthritis in both knees.  Pt played a lot of sports in younger years and he thinks this is the cause of his arthritic changes in knees. Pt reports trouble getting out of his chair and from bed. Pt reports trouble standing up and get on/of with toilet even with assistance toilet riser. Pt has purchased assistive equipments to assist with transfers at home. Biggest problem he has are negotiating stairs and falling during walking  in the yard. Pt with recent cortisone shots last week. Pt had has found relief from these shots and noticed improvement with stair negotiation.   PERTINENT HISTORY: Hx of PE PAIN:  Are you having pain? Yes: NPRS scale: 10/10 Pain location: Rt knee Pain description: throbbing Aggravating factors: storms, ambulating, transfers Relieving factors: Cortisone shots, heat, and pain meds  PRECAUTIONS: None  RED FLAGS: None   WEIGHT BEARING RESTRICTIONS: No  FALLS:  Has patient fallen in last 6 months? Yes. Number of falls 1 forward and rolled   PATIENT GOALS: I want painless movement, but I know that isn't possible. I don't want to be a burden to my  family.   OBJECTIVE:  Note: Objective measures were completed at Evaluation unless otherwise noted.  DIAGNOSTIC FINDINGS:   PATIENT SURVEYS:  Knee injury and Osteoarthritis Outcome Score-12 (KOOS-12): 50/100= 50%  COGNITION: Overall cognitive status: Within functional limits for tasks assessed     SENSATION: WFL   POSTURE: decreased lumbar lordosis, flexed trunk , and wide base of support, ER of BLE.   PALPATION: No deformities, non tender to palpate  LOWER EXTREMITY ROM:  Active ROM Right eval Left eval  Hip flexion    Hip extension    Hip abduction    Hip adduction    Hip internal rotation    Hip external rotation    Knee flexion 113 120  Knee extension -5 -5  Ankle dorsiflexion    Ankle plantarflexion    Ankle inversion    Ankle eversion     (Blank rows = not tested)  LOWER EXTREMITY MMT:  MMT Right eval Left eval  Hip flexion 4- 3+  Hip extension 4-* 4-*  Hip abduction 4- 4-  Hip adduction    Hip internal rotation    Hip external rotation    Knee flexion    Knee extension    Ankle dorsiflexion    Ankle plantarflexion    Ankle inversion    Ankle eversion     (Blank rows = not tested)  FUNCTIONAL TESTS:  30 Second Chair Stand Test: 9x  Norms:   Age 10-64 32-69 70-74 75-79 38-84 85-89 90-94  Women 15 15 14 13 12 11 9   Men 17 16 15 14 13 11 9     L SLS: 0.75 seconds  R SLS: 0.60 seconds  Norms: 18-39  F: 43.5 seconds  M: 43.2 seconds 40-49  F: 40.4 seconds  M: 40.1 seconds 50-59  F: 36 seconds  M: 38.1 seconds 60-69  F: 25.1 seconds  M: 28.7 seconds 70-79  F: 11.3 seconds  M: 18.3 seconds  GAIT: Distance walked: 66ft Assistive device utilized: Single point cane Level of assistance: Complete Independence and Modified independence Comments: Antalgic with SPC intermittent touch down. BLE externally rotated and lateral trunk shifts to assist with BLE clerance.  TREATMENT DATE:  04/29/24 Supine:  heelslides 2X10 Rt LE  Bridge 2X10  SLR 2X10 each Long sitting hamstring stretch 2X30 each  04/26/24: Prone:  hamstring curls 3# 2 x 10   Quad stretch 2x 30 Sidelying:   Abduction 2x 10 Supine:   Bridge 2x 10 with RTB around thighs  SLR 2x 10 Seated: long sitting hamstring stretch 3x 30 Standing: sidestep RTB around thigh 2RT  Tandem stance 2x 30  04/24/24 Nustep seat 14 level 4 [place] UE/LE 5 minutes Seated:  sit to stands with blue foam in chair 2X10  Long sitting hamstring stretch 1 minute each side Supine: bridge 10X  Bridge RTB abduction with hold 7X (unable to complete more due to Rt HS cramping)  SLR 10X each  Clams RTB 10X each  Hamstring stretch Sidelying clams 10X3 each with RTB each   PATIENT EDUCATION:  Education details: PT Evaluation, findings, prognosis, frequency, attendance policy, and HEP. Person educated: Patient Education method: Medical illustrator Education comprehension: verbalized understanding  HOME EXERCISE PROGRAM: Access Code: [email or web address] URL: [email or web address] Date: 04/01/2024 Prepared by: Omega Bottcher  Exercises - Clamshell with Resistance  - 1 x daily - 7 x weekly - 3 sets - 10 reps - Supine Bridge with Resistance Band  - 1 x daily - 7 x weekly - 3 sets - 10 reps - Sit to Stand Without Arm Support  - 1 x daily - 7 x weekly - 3 sets - 10 reps - Single Leg Stance with Support  - 1 x daily - 7 x weekly - 3 sets - 10 reps  04/26/24:- Standing Tandem Balance with Counter Support  - 2 x daily - 7 x weekly - 1 sets - 3 reps - 30 hold  ASSESSMENT:  CLINICAL IMPRESSION: Session limited today by pain. Pt with difficulty bending Rt Knee and increased pain making WB exercises difficult this session.  Completed NWB exercises and focused on painfree ROM for Rt knee. Suggested pt reach out to MD and return if pain  remains high without improving over the next few days. Recommended use of ice and/or heat at home.  Attempt to resume standing/balance exercises when pain reduced.      Eval:  Patient is a 78 y.o. male who was seen today for physical therapy evaluation and treatment for  M25.561,M25.562,G89.29 (ICD-10-CM) - Chronic pain of both knees  M17.11 (ICD-10-CM) - Primary osteoarthritis of right knee  M17.12 (ICD-10-CM) - Primary osteoarthritis of left knee    Pt with general primary osteoarthritis in bilateral knees. Demonstrating bilateral range of motion limitation, BLE muscle weakness, and balance impairments which are limiting pt's functional mobility, ambulation capacity, and safety during functional mobility. Pt will benefit from skilled Physical Therapy services to address deficits/limitations in order to improve functional and QOL.    OBJECTIVE IMPAIRMENTS: Abnormal gait, decreased activity tolerance, decreased balance, decreased mobility, difficulty walking, decreased ROM, decreased strength, hypomobility, postural dysfunction, and pain.   ACTIVITY LIMITATIONS: carrying, bending, sitting, standing, squatting, stairs, transfers, bathing, toileting, and locomotion level  PARTICIPATION LIMITATIONS: meal prep, shopping, community activity, occupation, and yard work  PERSONAL FACTORS: Age are also affecting patient's functional outcome.   REHAB POTENTIAL: Good  CLINICAL DECISION MAKING: Stable/uncomplicated  EVALUATION COMPLEXITY: Low   GOALS: Goals reviewed with patient? No  SHORT TERM GOALS: Target date: 04/22/24  Pt will be independent with HEP in order to demonstrate participation in Physical Therapy POC.  Baseline: Goal status: INITIAL  2.  Pt will 3/10 pain  during mobility in order to demonstrate improved pain while performing ADLs.  Baseline:  Goal status: INITIAL  LONG TERM GOALS: Target date: 05/13/24  Pt will improve 30 Second Chair Stand Test by at least 3 reps in order to  demonstrate improved functional strength to return to desired activities.  Baseline: see objective.  Goal status: INITIAL  2.  Pt will improve SL Balance by at least 5 seconds bilaterally in order to demonstrate improved functional ambulatory capacity in community setting.  Baseline: see objective.  Goal status: INITIAL  3.  Pt will improve KOOS-12 score by 20% in order to demonstrate improved pain with functional goals and outcomes. Baseline: see objective.  Goal status: INITIAL  4.  Pt will improve Bilateral knee ROM to 0-125 bilaterally in order to improve symmetry and reduce pain during functional activities.. Baseline: see objective.  Goal status: INITIAL  5.  Pt will improve BLE MMT  by at least 1/2 MMT in order to improve endurance, capacity and balance during functional activities.. Baseline: see objective.  Goal status: INITIAL   PLAN:  PT FREQUENCY: 2x/week  PT DURATION: 6 weeks  PLANNED INTERVENTIONS: 97164- PT Re-evaluation, 97750- Physical Performance Testing, 97110-Therapeutic exercises, 97530- Therapeutic activity, [id number]- Neuromuscular re-education, 97535- Self Care, 02859- Manual therapy, [id number]- Gait training, [phone]- Electrical stimulation (unattended), [phone]- Electrical stimulation (manual), and Balance training  PLAN FOR NEXT SESSION: Hip and knee strengthening with progression to standing balance, functional strengthening.  Progress to BLE balance activities.  Update HEP PRN.    Vivian, Neko Mcgeehan B, PTA 04/29/2024, 10:10 AM

## 2024-05-01 ENCOUNTER — Ambulatory Visit (HOSPITAL_COMMUNITY): Admitting: Physical Therapy

## 2024-05-01 DIAGNOSIS — M25561 Pain in right knee: Secondary | ICD-10-CM | POA: Diagnosis not present

## 2024-05-01 DIAGNOSIS — G8929 Other chronic pain: Secondary | ICD-10-CM

## 2024-05-01 DIAGNOSIS — Z7409 Other reduced mobility: Secondary | ICD-10-CM

## 2024-05-01 NOTE — Therapy (Signed)
 OUTPATIENT PHYSICAL THERAPY LOWER EXTREMITY TREATMENT   Patient Name: Fremon Zacharia MRN: [id number] DOB:Dec 05, 1945, 78 y.o., male Today's Date: 05/01/2024  END OF SESSION:  PT End of Session - 05/01/24 1025     Visit Number 6    Number of Visits 13    Date for PT Re-Evaluation 05/13/24    Authorization Type Medicare A & B    Progress Note Due on Visit 10    PT Start Time 1017    PT Stop Time 1058    PT Time Calculation (min) 41 min    Activity Tolerance Patient tolerated treatment well    Behavior During Therapy [facility] for tasks assessed/performed            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   Past Surgical History:  Procedure Laterality Date   CORONARY ANGIOPLASTY WITH STENT PLACEMENT     ENDOVENOUS ABLATION SAPHENOUS VEIN W/ LASER Left 06/02/2022   endovenous laser ablation left greater saphenous vein by Penne Colorado MD   FRACTURE SURGERY  1995   right elbow   KNEE ARTHROSCOPY W/ MENISCAL REPAIR     Patient Active Problem List   Diagnosis Date Noted   Chronic pain of both knees 02/14/2024   Morbid obesity (HCC) 02/15/2023   Prediabetes 02/15/2023   History of pulmonary embolism 02/25/2020   Current use of long term anticoagulation 02/25/2020   Pulmonary nodules/lesions, multiple 02/25/2020   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   CAD in native artery 08/31/2016   Essential hypertension 08/31/2016   Dyslipidemia 08/31/2016   Family history of heart disease in male family member before age 83 08/31/2016   Hearing loss, bilateral 08/31/2016   Generalized anxiety disorder 08/31/2016   History of colonic polyps 08/31/2016    PCP: Purcell Emil Schanz, MD  REFERRING PROVIDER: Margrette Taft BRAVO, MD  REFERRING DIAG:  M25.561,M25.562,G89.29 (ICD-10-CM) - Chronic pain of both knees  M17.11 (ICD-10-CM) - Primary osteoarthritis of right knee  M17.12 (ICD-10-CM) - Primary osteoarthritis of left knee    THERAPY DIAG:  No diagnosis found.  Rationale for Evaluation and Treatment: Rehabilitation  ONSET DATE: 20 years  SUBJECTIVE:   SUBJECTIVE STATEMENT: Pt states he used heat and completed the stretches since last visit and it is feeling better than last visit.  Currently Rt knee is 5/10 (was 10/10 last visit).  Lt knee remains same with 4-5/10 pain.     Eval:  Pt is very HOH, and lip reads. I have arthritis in both knees.  Pt played a lot of sports in younger years and he thinks this is the cause of his arthritic changes in knees. Pt reports trouble getting out of his chair and from bed. Pt reports trouble standing up and get on/of with toilet even with assistance toilet riser. Pt has purchased assistive equipments to assist with transfers at home. Biggest problem he has are negotiating stairs and falling during walking in the yard. Pt with recent cortisone shots last week. Pt had has found relief from these shots and noticed improvement with stair negotiation.   PERTINENT HISTORY: Hx of PE PAIN:  Are you having pain? Yes: NPRS scale: 5/10 Pain  location: Rt knee Pain description: throbbing Aggravating factors: storms, ambulating, transfers Relieving factors: Cortisone shots, heat, and pain meds  PRECAUTIONS: None  RED FLAGS: None   WEIGHT BEARING RESTRICTIONS: No  FALLS:  Has patient fallen in last 6 months? Yes. Number of falls 1 forward and rolled   PATIENT GOALS: I want painless movement, but I know that isn't possible. I don't want to be a burden to my family.   OBJECTIVE:  Note: Objective measures were completed at Evaluation unless otherwise noted.  DIAGNOSTIC FINDINGS:   PATIENT SURVEYS:  Knee injury and Osteoarthritis Outcome Score-12 (KOOS-12):  50/100= 50%  COGNITION: Overall cognitive status: Within functional limits for tasks assessed     SENSATION: WFL   POSTURE: decreased lumbar lordosis, flexed trunk , and wide base of support, ER of BLE.   PALPATION: No deformities, non tender to palpate  LOWER EXTREMITY ROM:  Active ROM Right eval Left eval  Hip flexion    Hip extension    Hip abduction    Hip adduction    Hip internal rotation    Hip external rotation    Knee flexion 113 120  Knee extension -5 -5  Ankle dorsiflexion    Ankle plantarflexion    Ankle inversion    Ankle eversion     (Blank rows = not tested)  LOWER EXTREMITY MMT:  MMT Right eval Left eval  Hip flexion 4- 3+  Hip extension 4-* 4-*  Hip abduction 4- 4-  Hip adduction    Hip internal rotation    Hip external rotation    Knee flexion    Knee extension    Ankle dorsiflexion    Ankle plantarflexion    Ankle inversion    Ankle eversion     (Blank rows = not tested)  FUNCTIONAL TESTS:  30 Second Chair Stand Test: 9x  Norms:   Age 74-64 44-69 70-74 75-79 35-84 85-89 90-94  Women 15 15 14 13 12 11 9   Men 17 16 15 14 13 11 9     L SLS: 0.75 seconds  R SLS: 0.60 seconds  Norms: 18-39  F: 43.5 seconds  M: 43.2 seconds 40-49  F: 40.4 seconds  M: 40.1 seconds 50-59  F: 36 seconds  M: 38.1 seconds 60-69  F: 25.1 seconds  M: 28.7 seconds 70-79  F: 11.3 seconds  M: 18.3 seconds  GAIT: Distance walked: 57ft Assistive device utilized: Single point cane Level of assistance: Complete Independence and Modified independence Comments: Antalgic with SPC intermittent touch down. BLE externally rotated and lateral trunk shifts to assist with BLE clerance.                                                                                                                                 TREATMENT DATE:  05/01/24 Nustep set 13 vel 4, 5 minutes Standing:  heelraises on incline 20X  Toeraises on decline 20X  Hip abduction with  GTB 2X10 each  Tandem stance 1 minute each LE lead (max Rt lead 20. Lt lead 30)  Forward lunge onto 4 step  intermittent HHA 10X each  Lateral stepping with GTB around thighs 10 ft line 3RT  Monster walk GTB around thighs 10 ft line 3RT Sit to stands from chair with 2 foam pieces to elevate  04/29/24 Supine:  heelslides 2X10 Rt LE  Bridge 2X10  SLR 2X10 each Long sitting hamstring stretch 2X30 each  04/26/24: Prone:  hamstring curls 3# 2 x 10   Quad stretch 2x 30 Sidelying:   Abduction 2x 10 Supine:   Bridge 2x 10 with RTB around thighs  SLR 2x 10 Seated: long sitting hamstring stretch 3x 30 Standing: sidestep RTB around thigh 2RT  Tandem stance 2x 30  04/24/24 Nustep seat 14 level 4 [place] UE/LE 5 minutes Seated:  sit to stands with blue foam in chair 2X10  Long sitting hamstring stretch 1 minute each side Supine: bridge 10X  Bridge RTB abduction with hold 7X (unable to complete more due to Rt HS cramping)  SLR 10X each  Clams RTB 10X each  Hamstring stretch Sidelying clams 10X3 each with RTB each   PATIENT EDUCATION:  Education details: PT Evaluation, findings, prognosis, frequency, attendance policy, and HEP. Person educated: Patient Education method: Medical illustrator Education comprehension: verbalized understanding  HOME EXERCISE PROGRAM: Access Code: [email or web address] URL: [email or web address] Date: 04/01/2024 Prepared by: Omega Bottcher  Exercises - Clamshell with Resistance  - 1 x daily - 7 x weekly - 3 sets - 10 reps - Supine Bridge with Resistance Band  - 1 x daily - 7 x weekly - 3 sets - 10 reps - Sit to Stand Without Arm Support  - 1 x daily - 7 x weekly - 3 sets - 10 reps - Single Leg Stance with Support  - 1 x daily - 7 x weekly - 3 sets - 10 reps  04/26/24:- Standing Tandem Balance with Counter Support  - 2 x daily - 7 x weekly - 1 sets - 3 reps - 30 hold  ASSESSMENT:  CLINICAL IMPRESSION: Able to resume strengthen  and stabilization exercises today as pain reduced as compared to last visit.  Sit to stands remain most challenging with additional 2 foam riser placed in order to achieve without UE assist. Added band to hip abduction and began monster walks with band as well. Pt able to complete these tasks without c/o pain.  Improved ability to maintain tandem stance today with either LE leading.  Intermittent rest breaks needed and required UE assist with all exercises today.        Eval:  Patient is a 78 y.o. male who was seen today for physical therapy evaluation and treatment for  M25.561,M25.562,G89.29 (ICD-10-CM) - Chronic pain of both knees  M17.11 (ICD-10-CM) - Primary osteoarthritis of right knee  M17.12 (ICD-10-CM) - Primary osteoarthritis of left knee    Pt with general primary osteoarthritis in bilateral knees. Demonstrating bilateral range of motion limitation, BLE muscle weakness, and balance impairments which are limiting pt's functional mobility, ambulation capacity, and safety during functional mobility. Pt will benefit from skilled Physical Therapy services to address deficits/limitations in order to improve functional and QOL.    OBJECTIVE IMPAIRMENTS: Abnormal gait, decreased activity tolerance, decreased balance, decreased mobility, difficulty walking, decreased ROM, decreased strength, hypomobility, postural dysfunction, and pain.   ACTIVITY LIMITATIONS: carrying, bending, sitting, standing, squatting, stairs, transfers, bathing, toileting, and locomotion level  PARTICIPATION LIMITATIONS:  meal prep, shopping, community activity, occupation, and yard work  PERSONAL FACTORS: Age are also affecting patient's functional outcome.   REHAB POTENTIAL: Good  CLINICAL DECISION MAKING: Stable/uncomplicated  EVALUATION COMPLEXITY: Low   GOALS: Goals reviewed with patient? No  SHORT TERM GOALS: Target date: 04/22/24  Pt will be independent with HEP in order to demonstrate participation in  Physical Therapy POC.  Baseline: Goal status: INITIAL  2.  Pt will 3/10 pain during mobility in order to demonstrate improved pain while performing ADLs.  Baseline:  Goal status: INITIAL  LONG TERM GOALS: Target date: 05/13/24  Pt will improve 30 Second Chair Stand Test by at least 3 reps in order to demonstrate improved functional strength to return to desired activities.  Baseline: see objective.  Goal status: INITIAL  2.  Pt will improve SL Balance by at least 5 seconds bilaterally in order to demonstrate improved functional ambulatory capacity in community setting.  Baseline: see objective.  Goal status: INITIAL  3.  Pt will improve KOOS-12 score by 20% in order to demonstrate improved pain with functional goals and outcomes. Baseline: see objective.  Goal status: INITIAL  4.  Pt will improve Bilateral knee ROM to 0-125 bilaterally in order to improve symmetry and reduce pain during functional activities.. Baseline: see objective.  Goal status: INITIAL  5.  Pt will improve BLE MMT  by at least 1/2 MMT in order to improve endurance, capacity and balance during functional activities.. Baseline: see objective.  Goal status: INITIAL   PLAN:  PT FREQUENCY: 2x/week  PT DURATION: 6 weeks  PLANNED INTERVENTIONS: 97164- PT Re-evaluation, 97750- Physical Performance Testing, 97110-Therapeutic exercises, 97530- Therapeutic activity, [id number]- Neuromuscular re-education, 97535- Self Care, 02859- Manual therapy, [id number]- Gait training, [phone]- Electrical stimulation (unattended), [phone]- Electrical stimulation (manual), and Balance training  PLAN FOR NEXT SESSION: Hip and knee strengthening with progression to standing balance, functional strengthening.  Progress to BLE balance activities.  Update HEP PRN.    Vivian, Leniyah Martell B, PTA 05/01/2024, 10:26 AM

## 2024-05-07 ENCOUNTER — Ambulatory Visit (HOSPITAL_COMMUNITY): Admitting: Physical Therapy

## 2024-05-07 DIAGNOSIS — M25561 Pain in right knee: Secondary | ICD-10-CM | POA: Diagnosis not present

## 2024-05-07 DIAGNOSIS — Z7409 Other reduced mobility: Secondary | ICD-10-CM

## 2024-05-07 DIAGNOSIS — G8929 Other chronic pain: Secondary | ICD-10-CM

## 2024-05-07 NOTE — Therapy (Signed)
 OUTPATIENT PHYSICAL THERAPY LOWER EXTREMITY TREATMENT   Patient Name: [name] MRN: [id number] DOB:06-Jul-1946, 78 y.o., male Today's Date: 05/07/2024  END OF SESSION:  PT End of Session - 05/07/24 1024     Visit Number 7    Number of Visits 13    Date for PT Re-Evaluation 05/13/24    Authorization Type Medicare A & B    Progress Note Due on Visit 10    PT Start Time 1020    PT Stop Time 1100    PT Time Calculation (min) 40 min    Activity Tolerance Patient tolerated treatment well    Behavior During Therapy [facility] for tasks assessed/performed            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   Past Surgical History:  Procedure Laterality Date   CORONARY ANGIOPLASTY WITH STENT PLACEMENT     ENDOVENOUS ABLATION SAPHENOUS VEIN W/ LASER Left 06/02/2022   endovenous laser ablation left greater saphenous vein by Penne Colorado MD   FRACTURE SURGERY  1995   right elbow   KNEE ARTHROSCOPY W/ MENISCAL REPAIR     Patient Active Problem List   Diagnosis Date Noted   Chronic pain of both knees 02/14/2024   Morbid obesity (HCC) 02/15/2023   Prediabetes 02/15/2023   History of pulmonary embolism 02/25/2020   Current use of long term anticoagulation 02/25/2020   Pulmonary nodules/lesions, multiple 02/25/2020   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   CAD in native artery 08/31/2016   Essential hypertension 08/31/2016   Dyslipidemia 08/31/2016   Family history of heart disease in male family member before age 51 08/31/2016   Hearing loss, bilateral 08/31/2016   Generalized anxiety disorder 08/31/2016   History of colonic polyps 08/31/2016    PCP: Purcell Emil Schanz, MD  REFERRING PROVIDER: Margrette Taft BRAVO, MD  REFERRING DIAG:  M25.561,M25.562,G89.29 (ICD-10-CM) - Chronic pain of both knees  M17.11 (ICD-10-CM) - Primary osteoarthritis of right knee  M17.12 (ICD-10-CM) - Primary osteoarthritis of left knee    THERAPY DIAG:  Chronic pain of both knees  Impaired functional mobility, balance, gait, and endurance  Rationale for Evaluation and Treatment: Rehabilitation  ONSET DATE: 20 years  SUBJECTIVE:   SUBJECTIVE STATEMENT: Pt states he is no better, still limited mostly by his bil hip pain.  Current rt knee pain 7/10, Lt is 4/10.  Reports puffiness in Rt knee.      Eval:  Pt is very HOH, and lip reads. I have arthritis in both knees.  Pt played a lot of sports in younger years and he thinks this is the cause of his arthritic changes in knees. Pt reports trouble getting out of his chair and from bed. Pt reports trouble standing up and get on/of with toilet even with assistance toilet riser. Pt has purchased assistive equipments to assist with transfers at home. Biggest problem he has are negotiating stairs and falling during walking in the yard. Pt with recent cortisone shots last week. Pt had has found relief from these shots and noticed improvement with stair negotiation.   PERTINENT HISTORY: Hx of PE PAIN:  Are you having pain? Yes: NPRS scale:  5/10 Pain location: Rt knee Pain description: throbbing Aggravating factors: storms, ambulating, transfers Relieving factors: Cortisone shots, heat, and pain meds  PRECAUTIONS: None  RED FLAGS: None   WEIGHT BEARING RESTRICTIONS: No  FALLS:  Has patient fallen in last 6 months? Yes. Number of falls 1 forward and rolled   PATIENT GOALS: I want painless movement, but I know that isn't possible. I don't want to be a burden to my family.   OBJECTIVE:  Note: Objective measures were completed at Evaluation unless otherwise noted.  DIAGNOSTIC FINDINGS:   PATIENT SURVEYS:  Knee injury and Osteoarthritis Outcome Score-12  (KOOS-12): 50/100= 50%  COGNITION: Overall cognitive status: Within functional limits for tasks assessed     SENSATION: WFL   POSTURE: decreased lumbar lordosis, flexed trunk , and wide base of support, ER of BLE.   PALPATION: No deformities, non tender to palpate  LOWER EXTREMITY ROM:  Active ROM Right eval Left eval  Hip flexion    Hip extension    Hip abduction    Hip adduction    Hip internal rotation    Hip external rotation    Knee flexion 113 120  Knee extension -5 -5  Ankle dorsiflexion    Ankle plantarflexion    Ankle inversion    Ankle eversion     (Blank rows = not tested)  LOWER EXTREMITY MMT:  MMT Right eval Left eval  Hip flexion 4- 3+  Hip extension 4-* 4-*  Hip abduction 4- 4-  Hip adduction    Hip internal rotation    Hip external rotation    Knee flexion    Knee extension    Ankle dorsiflexion    Ankle plantarflexion    Ankle inversion    Ankle eversion     (Blank rows = not tested)  FUNCTIONAL TESTS:  30 Second Chair Stand Test: 9x  Norms:   Age 58-64 31-69 70-74 75-79 1-84 85-89 90-94  Women 15 15 14 13 12 11 9   Men 17 16 15 14 13 11 9     L SLS: 0.75 seconds  R SLS: 0.60 seconds  Norms: 18-39  F: 43.5 seconds  M: 43.2 seconds 40-49  F: 40.4 seconds  M: 40.1 seconds 50-59  F: 36 seconds  M: 38.1 seconds 60-69  F: 25.1 seconds  M: 28.7 seconds 70-79  F: 11.3 seconds  M: 18.3 seconds  GAIT: Distance walked: 46ft Assistive device utilized: Single point cane Level of assistance: Complete Independence and Modified independence Comments: Antalgic with SPC intermittent touch down. BLE externally rotated and lateral trunk shifts to assist with BLE clerance.                                                                                                                                 TREATMENT DATE:  05/07/24 Nustep seat 13, level 4, 5 minutes Standing:  heelraises on incline 20X  Toeraises on decline 20X  Tandem  stance 1 minute each LE lead (max Rt lead 20. Lt lead 30)  Forward lunge onto 4 step  intermittent HHA 10X2 each  Forward step up and overs 6 step , 1 UE assist 10X2 each  Lateral step downs 2X10 4 step 1 UE assist  Lateral stepping with GTB around thighs 20 ft line 2RT  Monster walk GTB around thighs 20 ft line 2RT Sit to stands from chair with 2 foam pieces to elevate 3X10  05/01/24 Nustep set 13 vel 4, 5 minutes Standing:  heelraises on incline 20X  Toeraises on decline 20X  Hip abduction with GTB 2X10 each  Tandem stance 1 minute each LE lead (max Rt lead 20. Lt lead 30)  Forward lunge onto 4 step  intermittent HHA 10X each  Lateral stepping with GTB around thighs 10 ft line 3RT  Monster walk GTB around thighs 10 ft line 3RT Sit to stands from chair with 2 foam pieces to elevate  04/29/24 Supine:  heelslides 2X10 Rt LE  Bridge 2X10  SLR 2X10 each Long sitting hamstring stretch 2X30 each  04/26/24: Prone:  hamstring curls 3# 2 x 10   Quad stretch 2x 30 Sidelying:   Abduction 2x 10 Supine:   Bridge 2x 10 with RTB around thighs  SLR 2x 10 Seated: long sitting hamstring stretch 3x 30 Standing: sidestep RTB around thigh 2RT  Tandem stance 2x 30  04/24/24 Nustep seat 14 level 4 [place] UE/LE 5 minutes Seated:  sit to stands with blue foam in chair 2X10  Long sitting hamstring stretch 1 minute each side Supine: bridge 10X  Bridge RTB abduction with hold 7X (unable to complete more due to Rt HS cramping)  SLR 10X each  Clams RTB 10X each  Hamstring stretch Sidelying clams 10X3 each with RTB each   PATIENT EDUCATION:  Education details: PT Evaluation, findings, prognosis, frequency, attendance policy, and HEP. Person educated: Patient Education method: Medical illustrator Education comprehension: verbalized understanding  HOME EXERCISE PROGRAM: Access Code: [email or web address] URL: [email or web address] Date: 04/01/2024 Prepared by: Omega Bottcher  Exercises - Clamshell with Resistance  - 1 x daily - 7 x weekly - 3 sets - 10 reps - Supine Bridge with Resistance Band  - 1 x daily - 7 x weekly - 3 sets - 10 reps - Sit to Stand Without Arm Support  - 1 x daily - 7 x weekly - 3 sets - 10 reps - Single Leg Stance with Support  - 1 x daily - 7 x weekly - 3 sets - 10 reps  04/26/24:- Standing Tandem Balance with Counter Support  - 2 x daily - 7 x weekly - 1 sets - 3 reps - 30 hold  ASSESSMENT:  CLINICAL IMPRESSION: Continued with focus on improving LE strength and stability . Still requires rise of 4 using foam to complete sit to stands wihtout UE assist.  Added forward step ups and overs to work on LE strength, specifically eccentric strength with step downs.  Pt did require bil UE to complete to lessen discomfort.  Able to increase hold times with tandem stance before needing to use UE to stabilize.  Pain did not increase overall at completion of session, however did not reduce either.  No rest breaks needed this session.         Eval:  Patient is a 78 y.o. male who was seen today for physical therapy evaluation and treatment for  M25.561,M25.562,G89.29 (ICD-10-CM) - Chronic pain of both knees  M17.11 (ICD-10-CM) - Primary osteoarthritis of right knee  M17.12 (ICD-10-CM) - Primary osteoarthritis of left knee    Pt with general primary osteoarthritis in bilateral knees. Demonstrating bilateral range of motion limitation, BLE muscle weakness, and balance impairments which are limiting pt's functional mobility, ambulation capacity, and safety during functional mobility. Pt will benefit from skilled Physical Therapy services to address deficits/limitations in order to improve functional and QOL.    OBJECTIVE IMPAIRMENTS: Abnormal gait, decreased activity tolerance, decreased balance, decreased mobility, difficulty walking, decreased ROM, decreased strength, hypomobility, postural dysfunction, and pain.   ACTIVITY LIMITATIONS:  carrying, bending, sitting, standing, squatting, stairs, transfers, bathing, toileting, and locomotion level  PARTICIPATION LIMITATIONS: meal prep, shopping, community activity, occupation, and yard work  PERSONAL FACTORS: Age are also affecting patient's functional outcome.   REHAB POTENTIAL: Good  CLINICAL DECISION MAKING: Stable/uncomplicated  EVALUATION COMPLEXITY: Low   GOALS: Goals reviewed with patient? No  SHORT TERM GOALS: Target date: 04/22/24  Pt will be independent with HEP in order to demonstrate participation in Physical Therapy POC.  Baseline: Goal status: INITIAL  2.  Pt will 3/10 pain during mobility in order to demonstrate improved pain while performing ADLs.  Baseline:  Goal status: INITIAL  LONG TERM GOALS: Target date: 05/13/24  Pt will improve 30 Second Chair Stand Test by at least 3 reps in order to demonstrate improved functional strength to return to desired activities.  Baseline: see objective.  Goal status: INITIAL  2.  Pt will improve SL Balance by at least 5 seconds bilaterally in order to demonstrate improved functional ambulatory capacity in community setting.  Baseline: see objective.  Goal status: INITIAL  3.  Pt will improve KOOS-12 score by 20% in order to demonstrate improved pain with functional goals and outcomes. Baseline: see objective.  Goal status: INITIAL  4.  Pt will improve Bilateral knee ROM to 0-125 bilaterally in order to improve symmetry and reduce pain during functional activities.. Baseline: see objective.  Goal status: INITIAL  5.  Pt will improve BLE MMT  by at least 1/2 MMT in order to improve endurance, capacity and balance during functional activities.. Baseline: see objective.  Goal status: INITIAL   PLAN:  PT FREQUENCY: 2x/week  PT DURATION: 6 weeks  PLANNED INTERVENTIONS: 97164- PT Re-evaluation, 97750- Physical Performance Testing, 97110-Therapeutic exercises, 97530- Therapeutic activity, [id number]-  Neuromuscular re-education, 97535- Self Care, 02859- Manual therapy, [id number]- Gait training, [phone]- Electrical stimulation (unattended), [phone]- Electrical stimulation (manual), and Balance training  PLAN FOR NEXT SESSION: Hip and knee strengthening with progression to standing balance, functional strengthening.  Progress to BLE balance activities.  Update HEP PRN.    Vivian, Ronasia Isola B, PTA 05/07/2024, 10:24 AM

## 2024-05-09 ENCOUNTER — Ambulatory Visit (HOSPITAL_COMMUNITY)

## 2024-05-09 ENCOUNTER — Encounter (HOSPITAL_COMMUNITY): Payer: Self-pay

## 2024-05-09 DIAGNOSIS — M25561 Pain in right knee: Secondary | ICD-10-CM | POA: Diagnosis not present

## 2024-05-09 DIAGNOSIS — G8929 Other chronic pain: Secondary | ICD-10-CM

## 2024-05-09 DIAGNOSIS — Z7409 Other reduced mobility: Secondary | ICD-10-CM

## 2024-05-09 NOTE — Therapy (Addendum)
 OUTPATIENT PHYSICAL THERAPY LOWER EXTREMITY TREATMENT   Patient Name: [name] MRN: [id number] DOB:1946/05/28, 78 y.o., male Today's Date: 05/09/2024  END OF SESSION:  PT End of Session - 05/09/24 1142     Visit Number 8    Number of Visits 13    Date for PT Re-Evaluation 05/13/24    Authorization Type Medicare A & B    Progress Note Due on Visit 10    PT Start Time 1142    PT Stop Time 1220    PT Time Calculation (min) 38 min    Activity Tolerance Patient tolerated treatment well    Behavior During Therapy [facility] for tasks assessed/performed             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   Past Surgical History:  Procedure Laterality Date   CORONARY ANGIOPLASTY WITH STENT PLACEMENT     ENDOVENOUS ABLATION SAPHENOUS VEIN W/ LASER Left 06/02/2022   endovenous laser ablation left greater saphenous vein by Penne Colorado MD   FRACTURE SURGERY  1995   right elbow   KNEE ARTHROSCOPY W/ MENISCAL REPAIR     Patient Active Problem List   Diagnosis Date Noted   Chronic pain of both knees 02/14/2024   Morbid obesity (HCC) 02/15/2023   Prediabetes 02/15/2023   History of pulmonary embolism 02/25/2020   Current use of long term anticoagulation 02/25/2020   Pulmonary nodules/lesions, multiple 02/25/2020   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   CAD in native artery 08/31/2016   Essential hypertension 08/31/2016   Dyslipidemia 08/31/2016   Family history of heart disease in male family member before age 31 08/31/2016   Hearing loss, bilateral 08/31/2016   Generalized anxiety disorder 08/31/2016   History of colonic polyps 08/31/2016    PCP: Purcell Emil Schanz, MD  REFERRING PROVIDER: Margrette Taft BRAVO, MD  REFERRING DIAG:  M25.561,M25.562,G89.29 (ICD-10-CM) - Chronic pain of both knees  M17.11 (ICD-10-CM) - Primary osteoarthritis of right knee  M17.12 (ICD-10-CM) - Primary osteoarthritis of left knee    THERAPY DIAG:  Chronic pain of both knees  Impaired functional mobility, balance, gait, and endurance  Rationale for Evaluation and Treatment: Rehabilitation  ONSET DATE: 20 years  SUBJECTIVE:   SUBJECTIVE STATEMENT: Pt states he does not think therapy is helping much. Pt states the weather has a big impact on his knee pain. Pt states right knee is a 8/10 pain; 4/10 pain in left knee.    Eval:  Pt is very HOH, and lip reads. I have arthritis in both knees.  Pt played a lot of sports in younger years and he thinks this is the cause of his arthritic changes in knees. Pt reports trouble getting out of his chair and from bed. Pt reports trouble standing up and get on/of with toilet even with assistance toilet riser. Pt has purchased assistive equipments to assist with transfers at home. Biggest problem he has are negotiating stairs and falling during walking in the yard. Pt with recent cortisone shots last week. Pt had has found relief from these shots and noticed improvement with stair negotiation.   PERTINENT HISTORY: Hx of PE PAIN:  Are you  having pain? Yes: NPRS scale: 5/10 Pain location: Rt knee Pain description: throbbing Aggravating factors: storms, ambulating, transfers Relieving factors: Cortisone shots, heat, and pain meds  PRECAUTIONS: None  RED FLAGS: None   WEIGHT BEARING RESTRICTIONS: No  FALLS:  Has patient fallen in last 6 months? Yes. Number of falls 1 forward and rolled   PATIENT GOALS: I want painless movement, but I know that isn't possible. I don't want to be a burden to my family.   OBJECTIVE:  Note: Objective measures were completed at Evaluation unless otherwise noted.  DIAGNOSTIC FINDINGS:   PATIENT SURVEYS:  Knee injury and  Osteoarthritis Outcome Score-12 (KOOS-12): 50/100= 50%  COGNITION: Overall cognitive status: Within functional limits for tasks assessed     SENSATION: WFL   POSTURE: decreased lumbar lordosis, flexed trunk , and wide base of support, ER of BLE.   PALPATION: No deformities, non tender to palpate  LOWER EXTREMITY ROM:  Active ROM Right eval Left eval  Hip flexion    Hip extension    Hip abduction    Hip adduction    Hip internal rotation    Hip external rotation    Knee flexion 113 120  Knee extension -5 -5  Ankle dorsiflexion    Ankle plantarflexion    Ankle inversion    Ankle eversion     (Blank rows = not tested)  LOWER EXTREMITY MMT:  MMT Right eval Left eval  Hip flexion 4- 3+  Hip extension 4-* 4-*  Hip abduction 4- 4-  Hip adduction    Hip internal rotation    Hip external rotation    Knee flexion    Knee extension    Ankle dorsiflexion    Ankle plantarflexion    Ankle inversion    Ankle eversion     (Blank rows = not tested)  FUNCTIONAL TESTS:  30 Second Chair Stand Test: 9x  Norms:   Age 3-64 68-69 70-74 75-79 62-84 85-89 90-94  Women 15 15 14 13 12 11 9   Men 17 16 15 14 13 11 9     L SLS: 0.75 seconds  R SLS: 0.60 seconds  Norms: 18-39  F: 43.5 seconds  M: 43.2 seconds 40-49  F: 40.4 seconds  M: 40.1 seconds 50-59  F: 36 seconds  M: 38.1 seconds 60-69  F: 25.1 seconds  M: 28.7 seconds 70-79  F: 11.3 seconds  M: 18.3 seconds  GAIT: Distance walked: 91ft Assistive device utilized: Single point cane Level of assistance: Complete Independence and Modified independence Comments: Antalgic with SPC intermittent touch down. BLE externally rotated and lateral trunk shifts to assist with BLE clerance.                                                                                                                                 TREATMENT DATE:  05/09/2024  Therapeutic Exercise: -Leg Press, 3 sets of 10 reps, plate 5, pt cued  for  eccentric control, pt reports no increased pain -Standing 3 way hip 1 sets 10 reps, bilaterally, pt cued for upright trunk and maintaining of neutral spine -Lateral stepping 3 laps 20 feet per lap, with GTB around ankles, pt cued for upright posture -Forward lunges, 1 set of 5 reps better performance going into RLE, pt cued for core activation and upright posture  Therapeutic Activity: -Sit to stands with 10lb kettle bell at chest level, 2 sets of 10 reps, pt needs 2 foam pads for avoidance of increased knee pain -Lateral step up and overs, 2 sets of 5 reps, 6 inch step -Sled pushes, 50lb, 3 laps, pt cued for constant movement of sled, 30 foot laps  05/07/24 Nustep seat 13, level 4, 5 minutes Standing:  heelraises on incline 20X  Toeraises on decline 20X  Tandem stance 1 minute each LE lead (max Rt lead 20. Lt lead 30)  Forward lunge onto 4 step  intermittent HHA 10X2 each  Forward step up and overs 6 step , 1 UE assist 10X2 each  Lateral step downs 2X10 4 step 1 UE assist  Lateral stepping with GTB around thighs 20 ft line 2RT  Monster walk GTB around thighs 20 ft line 2RT Sit to stands from chair with 2 foam pieces to elevate 3X10  05/01/24 Nustep set 13 vel 4, 5 minutes Standing:  heelraises on incline 20X  Toeraises on decline 20X  Hip abduction with GTB 2X10 each  Tandem stance 1 minute each LE lead (max Rt lead 20. Lt lead 30)  Forward lunge onto 4 step  intermittent HHA 10X each  Lateral stepping with GTB around thighs 10 ft line 3RT  Monster walk GTB around thighs 10 ft line 3RT Sit to stands from chair with 2 foam pieces to elevate  PATIENT EDUCATION:  Education details: PT Evaluation, findings, prognosis, frequency, attendance policy, and HEP. Person educated: Patient Education method: Medical illustrator Education comprehension: verbalized understanding  HOME EXERCISE PROGRAM: Access Code: [email or web address] URL: [email or web address] Date:  04/01/2024 Prepared by: Omega Bottcher  Exercises - Clamshell with Resistance  - 1 x daily - 7 x weekly - 3 sets - 10 reps - Supine Bridge with Resistance Band  - 1 x daily - 7 x weekly - 3 sets - 10 reps - Sit to Stand Without Arm Support  - 1 x daily - 7 x weekly - 3 sets - 10 reps - Single Leg Stance with Support  - 1 x daily - 7 x weekly - 3 sets - 10 reps  04/26/24:- Standing Tandem Balance with Counter Support  - 2 x daily - 7 x weekly - 1 sets - 3 reps - 30 hold  ASSESSMENT:  CLINICAL IMPRESSION: Patient continues to demonstrate bilateral knee pain, decreased BLE strength, decreased gait quality and balance. Patient also demonstrates difficulty with stationary bike exercise during today's session but was ale to complete 5 minutes with fair speed. Patient able to progress dynamic balance and core activation exercises today with resisted walking variations and leg press, good performance with verbal cueing. Plan to reassess next session, probable discharge. Patient would continue to benefit from skilled physical therapy for decreased knee pain, increased endurance with ambulation, increased LE strength, and improved balance for improved quality of life, improved independence with gait training and continued progress towards therapy goals.       Eval:  Patient is a 78 y.o. male who was seen today for physical therapy evaluation and treatment for  M25.561,M25.562,G89.29 (ICD-10-CM) -  Chronic pain of both knees  M17.11 (ICD-10-CM) - Primary osteoarthritis of right knee  M17.12 (ICD-10-CM) - Primary osteoarthritis of left knee  Pt with general primary osteoarthritis in bilateral knees. Demonstrating bilateral range of motion limitation, BLE muscle weakness, and balance impairments which are limiting pt's functional mobility, ambulation capacity, and safety during functional mobility. Pt will benefit from skilled Physical Therapy services to address deficits/limitations in order to improve  functional and QOL.    OBJECTIVE IMPAIRMENTS: Abnormal gait, decreased activity tolerance, decreased balance, decreased mobility, difficulty walking, decreased ROM, decreased strength, hypomobility, postural dysfunction, and pain.   ACTIVITY LIMITATIONS: carrying, bending, sitting, standing, squatting, stairs, transfers, bathing, toileting, and locomotion level  PARTICIPATION LIMITATIONS: meal prep, shopping, community activity, occupation, and yard work  PERSONAL FACTORS: Age are also affecting patient's functional outcome.   REHAB POTENTIAL: Good  CLINICAL DECISION MAKING: Stable/uncomplicated  EVALUATION COMPLEXITY: Low   GOALS: Goals reviewed with patient? No  SHORT TERM GOALS: Target date: 04/22/24  Pt will be independent with HEP in order to demonstrate participation in Physical Therapy POC.  Baseline: Goal status: INITIAL  2.  Pt will 3/10 pain during mobility in order to demonstrate improved pain while performing ADLs.  Baseline:  Goal status: INITIAL  LONG TERM GOALS: Target date: 05/13/24  Pt will improve 30 Second Chair Stand Test by at least 3 reps in order to demonstrate improved functional strength to return to desired activities.  Baseline: see objective.  Goal status: INITIAL  2.  Pt will improve SL Balance by at least 5 seconds bilaterally in order to demonstrate improved functional ambulatory capacity in community setting.  Baseline: see objective.  Goal status: INITIAL  3.  Pt will improve KOOS-12 score by 20% in order to demonstrate improved pain with functional goals and outcomes. Baseline: see objective.  Goal status: INITIAL  4.  Pt will improve Bilateral knee ROM to 0-125 bilaterally in order to improve symmetry and reduce pain during functional activities.. Baseline: see objective.  Goal status: INITIAL  5.  Pt will improve BLE MMT  by at least 1/2 MMT in order to improve endurance, capacity and balance during functional activities.. Baseline:  see objective.  Goal status: INITIAL   PLAN:  PT FREQUENCY: 2x/week  PT DURATION: 6 weeks  PLANNED INTERVENTIONS: 02835- PT Re-evaluation, 97750- Physical Performance Testing, 97110-Therapeutic exercises, 97530- Therapeutic activity, [id number]- Neuromuscular re-education, 97535- Self Care, 02859- Manual therapy, [phone]- Gait training, [phone]- Electrical stimulation (unattended), [phone]- Electrical stimulation (manual), and Balance training  PLAN FOR NEXT SESSION: Reassess, probable discharge    Lang Ada, PT, DPT [facility] Office: [phone] 1:09 PM, 05/09/24

## 2024-05-14 ENCOUNTER — Encounter (HOSPITAL_COMMUNITY): Payer: Self-pay

## 2024-05-14 ENCOUNTER — Ambulatory Visit (HOSPITAL_COMMUNITY): Attending: Orthopedic Surgery

## 2024-05-14 DIAGNOSIS — G8929 Other chronic pain: Secondary | ICD-10-CM | POA: Insufficient documentation

## 2024-05-14 DIAGNOSIS — M25561 Pain in right knee: Secondary | ICD-10-CM | POA: Insufficient documentation

## 2024-05-14 DIAGNOSIS — M25562 Pain in left knee: Secondary | ICD-10-CM | POA: Diagnosis present

## 2024-05-14 DIAGNOSIS — Z7409 Other reduced mobility: Secondary | ICD-10-CM | POA: Insufficient documentation

## 2024-05-14 NOTE — Addendum Note (Signed)
 Addended by: Chavez Rosol on: 05/14/2024 04:10 PM   Modules accepted: Orders

## 2024-05-14 NOTE — Therapy (Addendum)
 OUTPATIENT PHYSICAL THERAPY LOWER EXTREMITY TREATMENT/DISCHARGE  PHYSICAL THERAPY DISCHARGE SUMMARY  Visits from Start of Care: 8  Current functional level related to goals / functional outcomes: WFL   Remaining deficits: Pain, abnormal gait pattern, decreased balance   Education / Equipment: Educated on importance of HEP compliance and encouraged to tap into community resources for improved quality of life and overall wellbeing.    Patient agrees to discharge. Patient goals were partially met. Patient is being discharged due to being pleased with the current functional level.   Patient Name: [name] MRN: [id number] DOB:[birth year], 78 y.o., male Today's Date: 05/14/2024  END OF SESSION:  PT End of Session - 05/14/24 1147     Visit Number 9    Number of Visits 13    Date for PT Re-Evaluation 05/13/24    Authorization Type Medicare A & B    Progress Note Due on Visit 10    PT Start Time 1147    PT Stop Time 1225    PT Time Calculation (min) 38 min    Activity Tolerance Patient tolerated treatment well    Behavior During Therapy [facility] for tasks assessed/performed              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   Past Surgical History:  Procedure Laterality Date   CORONARY ANGIOPLASTY WITH STENT PLACEMENT     ENDOVENOUS ABLATION SAPHENOUS VEIN W/ LASER Left 06/02/2022   endovenous laser ablation left greater saphenous vein by Penne Colorado MD   FRACTURE SURGERY  1995   right elbow   KNEE ARTHROSCOPY W/ MENISCAL REPAIR     Patient Active Problem List   Diagnosis Date Noted   Chronic pain of both knees 02/14/2024   Morbid obesity (HCC) 02/15/2023   Prediabetes 02/15/2023   History of pulmonary  embolism 02/25/2020   Current use of long term anticoagulation 02/25/2020   Pulmonary nodules/lesions, multiple 02/25/2020   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   CAD in native artery 08/31/2016   Essential hypertension 08/31/2016   Dyslipidemia 08/31/2016   Family history of heart disease in male family member before age 77 08/31/2016   Hearing loss, bilateral 08/31/2016   Generalized anxiety disorder 08/31/2016   History of colonic polyps 08/31/2016    PCP: Purcell Emil Schanz, MD  REFERRING PROVIDER: Margrette Taft BRAVO, MD  REFERRING DIAG:  M25.561,M25.562,G89.29 (ICD-10-CM) - Chronic pain of both knees  M17.11 (ICD-10-CM) - Primary osteoarthritis of right knee  M17.12 (ICD-10-CM) - Primary osteoarthritis of left knee    THERAPY DIAG:  Chronic pain of both knees  Impaired functional mobility, balance, gait, and endurance  Rationale for Evaluation and Treatment: Rehabilitation  ONSET DATE: 20 years  SUBJECTIVE:   SUBJECTIVE STATEMENT: Pt states therapy has helped a little bit but feels he is ready to complete HEP at home independently. Pt states that he does not think the pain will ever be gone.    Eval:  Pt is very HOH, and lip reads. I have arthritis in both knees.  Pt played a lot of sports in younger years and he thinks this is the cause of his arthritic changes in knees. Pt  reports trouble getting out of his chair and from bed. Pt reports trouble standing up and get on/of with toilet even with assistance toilet riser. Pt has purchased assistive equipments to assist with transfers at home. Biggest problem he has are negotiating stairs and falling during walking in the yard. Pt with recent cortisone shots last week. Pt had has found relief from these shots and noticed improvement with stair negotiation.   PERTINENT HISTORY: Hx of PE PAIN:  Are you having pain? Yes: NPRS scale: 5/10 Pain location: Rt knee Pain description: throbbing Aggravating  factors: storms, ambulating, transfers Relieving factors: Cortisone shots, heat, and pain meds  PRECAUTIONS: None  RED FLAGS: None   WEIGHT BEARING RESTRICTIONS: No  FALLS:  Has patient fallen in last 6 months? Yes. Number of falls 1 forward and rolled   PATIENT GOALS: I want painless movement, but I know that isn't possible. I don't want to be a burden to my family.   OBJECTIVE:  Note: Objective measures were completed at Evaluation unless otherwise noted.  DIAGNOSTIC FINDINGS:   PATIENT SURVEYS:  Knee injury and Osteoarthritis Outcome Score-12 (KOOS-12): 50/100= 50%  COGNITION: Overall cognitive status: Within functional limits for tasks assessed     SENSATION: WFL   POSTURE: decreased lumbar lordosis, flexed trunk , and wide base of support, ER of BLE.   PALPATION: No deformities, non tender to palpate  LOWER EXTREMITY ROM:  Active ROM Right eval Left eval Right  05/14/24 Left 05/14/24  Hip flexion      Hip extension      Hip abduction      Hip adduction      Hip internal rotation      Hip external rotation      Knee flexion 113 120 114 122  Knee extension -5 -5 5 from neutral 2 from neutral  Ankle dorsiflexion      Ankle plantarflexion      Ankle inversion      Ankle eversion       (Blank rows = not tested)  LOWER EXTREMITY MMT:  MMT Right eval Left eval Right  05/14/24 Left 05/14/24  Hip flexion 4- 3+ 4+ 4  Hip extension 4-* 4-* 4+ 4  Hip abduction 4- 4- 4+ 4  Hip adduction      Hip internal rotation      Hip external rotation      Knee flexion      Knee extension      Ankle dorsiflexion      Ankle plantarflexion      Ankle inversion      Ankle eversion       (Blank rows = not tested)  FUNCTIONAL TESTS:  30 Second Chair Stand Test: 9x  Norms:   Age 35-64 76-69 70-74 75-79 80-84 85-89 90-94  Women 15 15 14 13 12 11 9   Men 17 16 15 14 13 11 9     L SLS: 0.75 seconds  R SLS: 0.60 seconds  Norms: 18-39  F: 43.5  seconds  M: 43.2 seconds 40-49  F: 40.4 seconds  M: 40.1 seconds 50-59  F: 36 seconds  M: 38.1 seconds 60-69  F: 25.1 seconds  M: 28.7 seconds 70-79  F: 11.3 seconds  M: 18.3 seconds 05/14/24: Chair stand 30 second: 9 reps SLS:  R: L:  GAIT: Distance walked: 64ft Assistive device utilized: Single point cane Level of assistance: Complete Independence and Modified independence Comments: Antalgic with SPC intermittent touch down. BLE externally rotated and  lateral trunk shifts to assist with BLE clerance.                                                                                                                                 TREATMENT DATE:  05/14/2024  Progress note: LEFS, 30 second chair stand test, strength and ROM assessed, SLS assessed Therapeutic Exercise: -Stationary bike, 5 minutes, pt cued for increased pace -SLS trails, 3 reps bilaterally, pt cued for max effort -STS, 2 sets of 8 reps throughout session, pt cued for minimal UE support -Education on physical activity and avoidance of negative spiral pain can have on overall wellbeing.    05/09/2024  Therapeutic Exercise: -Leg Press, 3 sets of 10 reps, plate 5, pt cued for eccentric control, pt reports no increased pain -Standing 3 way hip 1 sets 10 reps, bilaterally, pt cued for upright trunk and maintaining of neutral spine -Lateral stepping 3 laps 20 feet per lap, with GTB around ankles, pt cued for upright posture -Forward lunges, 1 set of 5 reps better performance going into RLE, pt cued for core activation and upright posture  Therapeutic Activity: -Sit to stands with 10lb kettle bell at chest level, 2 sets of 10 reps, pt needs 2 foam pads for avoidance of increased knee pain -Lateral step up and overs, 2 sets of 5 reps, 6 inch step -Sled pushes, 50lb, 3 laps, pt cued for constant movement of sled, 30 foot laps  05/07/24 Nustep seat 13, level 4, 5 minutes Standing:  heelraises on incline 20X  Toeraises  on decline 20X  Tandem stance 1 minute each LE lead (max Rt lead 20. Lt lead 30)  Forward lunge onto 4 step  intermittent HHA 10X2 each  Forward step up and overs 6 step , 1 UE assist 10X2 each  Lateral step downs 2X10 4 step 1 UE assist  Lateral stepping with GTB around thighs 20 ft line 2RT  Monster walk GTB around thighs 20 ft line 2RT Sit to stands from chair with 2 foam pieces to elevate 3X10  PATIENT EDUCATION:  Education details: PT Evaluation, findings, prognosis, frequency, attendance policy, and HEP. Person educated: Patient Education method: Medical illustrator Education comprehension: verbalized understanding  HOME EXERCISE PROGRAM: Access Code: [email or web address] URL: [email or web address] Date: 04/01/2024 Prepared by: Omega Bottcher  Exercises - Clamshell with Resistance  - 1 x daily - 7 x weekly - 3 sets - 10 reps - Supine Bridge with Resistance Band  - 1 x daily - 7 x weekly - 3 sets - 10 reps - Sit to Stand Without Arm Support  - 1 x daily - 7 x weekly - 3 sets - 10 reps - Single Leg Stance with Support  - 1 x daily - 7 x weekly - 3 sets - 10 reps  04/26/24:- Standing Tandem Balance with Counter Support  - 2 x daily - 7 x weekly - 1 sets - 3 reps - 30  hold  ASSESSMENT:  CLINICAL IMPRESSION: Patient continues to demonstrate bilateral knee pain, improved BLE strength, decreased gait quality and balance. Patient also demonstrates improved tolerance with stationary bike exercise during today's session. Patient able to meet only 2/7 goals, with minimal progress towards remaining goals.  Pt to be discharged at this time due to pt wanting to be independent with HEP and pt stating he is satisfied with current level of function and states he will be able to live with the pain.        OBJECTIVE IMPAIRMENTS: Abnormal gait, decreased activity tolerance, decreased balance, decreased mobility, difficulty walking, decreased ROM, decreased strength,  hypomobility, postural dysfunction, and pain.   ACTIVITY LIMITATIONS: carrying, bending, sitting, standing, squatting, stairs, transfers, bathing, toileting, and locomotion level  PARTICIPATION LIMITATIONS: meal prep, shopping, community activity, occupation, and yard work  PERSONAL FACTORS: Age are also affecting patient's functional outcome.   REHAB POTENTIAL: Good  CLINICAL DECISION MAKING: Stable/uncomplicated  EVALUATION COMPLEXITY: Low   GOALS: Goals reviewed with patient? No  SHORT TERM GOALS: Target date: 04/22/24  Pt will be independent with HEP in order to demonstrate participation in Physical Therapy POC.  Baseline: Goal status: MET  2.  Pt will 3/10 pain during mobility in order to demonstrate improved pain while performing ADLs.  Baseline:  Goal status: MET  LONG TERM GOALS: Target date: 05/13/24  Pt will improve 30 Second Chair Stand Test by at least 3 reps in order to demonstrate improved functional strength to return to desired activities.  Baseline: see objective.  Goal status: NOT MET  2.  Pt will improve SL Balance by at least 5 seconds bilaterally in order to demonstrate improved functional ambulatory capacity in community setting.  Baseline: see objective.  Goal status: NOT MET  3.  Pt will improve KOOS-12 score by 20% in order to demonstrate improved pain with functional goals and outcomes. Baseline: see objective.  Goal status: NOT MET  4.  Pt will improve Bilateral knee ROM to 0-125 bilaterally in order to improve symmetry and reduce pain during functional activities.. Baseline: see objective.  Goal status: NOT MET  5.  Pt will improve BLE MMT  by at least 1/2 MMT in order to improve endurance, capacity and balance during functional activities. Baseline: see objective.  Goal status: NOT MET   PLAN:  PT FREQUENCY: 2x/week  PT DURATION: 6 weeks  PLANNED INTERVENTIONS: 97164- PT Re-evaluation, 97750- Physical Performance Testing,  97110-Therapeutic exercises, 97530- Therapeutic activity, [id number]- Neuromuscular re-education, 97535- Self Care, [id number]- Manual therapy, [phone]- Gait training, [phone]- Electrical stimulation (unattended), [phone]- Electrical stimulation (manual), and Balance training  PLAN FOR NEXT SESSION: discharged   Lang Ada, PT, DPT [facility] Office: [phone] 4:08 PM, 05/14/24

## 2024-05-16 ENCOUNTER — Encounter (HOSPITAL_COMMUNITY)

## 2024-05-16 ENCOUNTER — Encounter (HOSPITAL_COMMUNITY): Payer: Self-pay

## 2024-05-21 ENCOUNTER — Encounter (HOSPITAL_COMMUNITY)

## 2024-05-23 ENCOUNTER — Encounter (HOSPITAL_COMMUNITY): Admitting: Physical Therapy

## 2024-06-15 ENCOUNTER — Other Ambulatory Visit: Payer: Self-pay | Admitting: Emergency Medicine

## 2024-06-28 ENCOUNTER — Ambulatory Visit: Admitting: Orthopedic Surgery

## 2024-06-28 ENCOUNTER — Encounter: Payer: Self-pay | Admitting: Orthopedic Surgery

## 2024-06-28 DIAGNOSIS — M1711 Unilateral primary osteoarthritis, right knee: Secondary | ICD-10-CM

## 2024-06-28 DIAGNOSIS — M170 Bilateral primary osteoarthritis of knee: Secondary | ICD-10-CM | POA: Diagnosis not present

## 2024-06-28 DIAGNOSIS — M1712 Unilateral primary osteoarthritis, left knee: Secondary | ICD-10-CM

## 2024-06-28 DIAGNOSIS — M25561 Pain in right knee: Secondary | ICD-10-CM

## 2024-06-28 DIAGNOSIS — G8929 Other chronic pain: Secondary | ICD-10-CM

## 2024-06-28 MED ORDER — METHYLPREDNISOLONE ACETATE 40 MG/ML IJ SUSP
40.0000 mg | Freq: Once | INTRAMUSCULAR | Status: AC
  Administered 2024-06-28: 40 mg via INTRA_ARTICULAR

## 2024-06-28 NOTE — Progress Notes (Signed)
   There were no vitals taken for this visit.  There is no height or weight on file to calculate BMI.  Chief Complaint  Patient presents with   Knee Pain    Both     No diagnosis found.  What pharmacy do you use ? ___________________________  DOI/DOS/ Date:    Unchanged  Injections helped about a week and physical therapy was not helpful

## 2024-06-28 NOTE — Progress Notes (Signed)
   Chief Complaint  Patient presents with   Knee Pain    Both    Encounter Diagnoses  Name Primary?   Chronic pain of both knees Yes   Primary osteoarthritis of right knee    Primary osteoarthritis of left knee     77 year old male with bilateral knee pain from osteoarthritis but he has history of significant and severe cardiac disease preventing him from proceeding to knee replacement surgery he received an injection in June 2025 got a little bit of relief in the right knee and fairly good relief on the left  After discussion with him and his wife regarding his medical situation and his anticoagulation both of us  agree that he should not proceed with surgery  We decided to inject his right knee   Procedure note right knee injection   verbal consent was obtained to inject right knee joint  Timeout was completed to confirm the site of injection  The medications used were depomedrol 40 mg and 1% lidocaine 3 cc Anesthesia was provided by ethyl chloride and the skin was prepped with alcohol.  After cleaning the skin with alcohol a 20-gauge needle was used to inject the right knee joint. There were no complications. A sterile bandage was applied.   The patient may return every 3 months or longer if needed for injection

## 2024-07-19 ENCOUNTER — Other Ambulatory Visit: Payer: Self-pay | Admitting: Emergency Medicine

## 2024-08-05 ENCOUNTER — Other Ambulatory Visit: Payer: Self-pay | Admitting: Emergency Medicine

## 2024-08-05 DIAGNOSIS — Z86711 Personal history of pulmonary embolism: Secondary | ICD-10-CM

## 2024-08-16 ENCOUNTER — Ambulatory Visit

## 2024-08-19 ENCOUNTER — Other Ambulatory Visit: Payer: Self-pay | Admitting: Emergency Medicine

## 2024-08-19 ENCOUNTER — Ambulatory Visit: Admitting: Emergency Medicine

## 2024-08-19 ENCOUNTER — Ambulatory Visit: Payer: Self-pay | Admitting: Emergency Medicine

## 2024-08-19 ENCOUNTER — Encounter: Payer: Self-pay | Admitting: Emergency Medicine

## 2024-08-19 VITALS — BP 120/70 | HR 71 | Temp 97.9°F | Ht 74.0 in | Wt 317.0 lb

## 2024-08-19 DIAGNOSIS — G8929 Other chronic pain: Secondary | ICD-10-CM

## 2024-08-19 DIAGNOSIS — M25562 Pain in left knee: Secondary | ICD-10-CM

## 2024-08-19 DIAGNOSIS — R29818 Other symptoms and signs involving the nervous system: Secondary | ICD-10-CM

## 2024-08-19 DIAGNOSIS — Z7901 Long term (current) use of anticoagulants: Secondary | ICD-10-CM

## 2024-08-19 DIAGNOSIS — I2510 Atherosclerotic heart disease of native coronary artery without angina pectoris: Secondary | ICD-10-CM

## 2024-08-19 DIAGNOSIS — C4359 Malignant melanoma of other part of trunk: Secondary | ICD-10-CM

## 2024-08-19 DIAGNOSIS — E785 Hyperlipidemia, unspecified: Secondary | ICD-10-CM

## 2024-08-19 DIAGNOSIS — E1159 Type 2 diabetes mellitus with other circulatory complications: Secondary | ICD-10-CM | POA: Diagnosis not present

## 2024-08-19 DIAGNOSIS — Z7984 Long term (current) use of oral hypoglycemic drugs: Secondary | ICD-10-CM

## 2024-08-19 DIAGNOSIS — I872 Venous insufficiency (chronic) (peripheral): Secondary | ICD-10-CM | POA: Insufficient documentation

## 2024-08-19 DIAGNOSIS — M25561 Pain in right knee: Secondary | ICD-10-CM | POA: Diagnosis not present

## 2024-08-19 DIAGNOSIS — F411 Generalized anxiety disorder: Secondary | ICD-10-CM

## 2024-08-19 DIAGNOSIS — I10 Essential (primary) hypertension: Secondary | ICD-10-CM

## 2024-08-19 DIAGNOSIS — Z6841 Body Mass Index (BMI) 40.0 and over, adult: Secondary | ICD-10-CM

## 2024-08-19 DIAGNOSIS — I152 Hypertension secondary to endocrine disorders: Secondary | ICD-10-CM

## 2024-08-19 LAB — COMPREHENSIVE METABOLIC PANEL WITH GFR
ALT: 21 U/L (ref 0–53)
AST: 17 U/L (ref 0–37)
Albumin: 4.4 g/dL (ref 3.5–5.2)
Alkaline Phosphatase: 53 U/L (ref 39–117)
BUN: 25 mg/dL — ABNORMAL HIGH (ref 6–23)
CO2: 27 meq/L (ref 19–32)
Calcium: 9.6 mg/dL (ref 8.4–10.5)
Chloride: 105 meq/L (ref 96–112)
Creatinine, Ser: 0.94 mg/dL (ref 0.40–1.50)
GFR: 78.77 mL/min (ref >60.00)
Glucose, Bld: 102 mg/dL — ABNORMAL HIGH (ref 70–99)
Potassium: 4.4 meq/L (ref 3.5–5.1)
Sodium: 140 meq/L (ref 135–145)
Total Bilirubin: 0.4 mg/dL (ref 0.2–1.2)
Total Protein: 7.1 g/dL (ref 6.0–8.3)

## 2024-08-19 LAB — CBC WITH DIFFERENTIAL/PLATELET
Basophils Absolute: 0.1 K/uL (ref 0.0–0.1)
Basophils Relative: 0.7 % (ref 0.0–3.0)
Eosinophils Absolute: 0.3 K/uL (ref 0.0–0.7)
Eosinophils Relative: 4.2 % (ref 0.0–5.0)
HCT: 45.6 % (ref 39.0–52.0)
Hemoglobin: 14.7 g/dL (ref 13.0–17.0)
Lymphocytes Relative: 20 % (ref 12.0–46.0)
Lymphs Abs: 1.7 K/uL (ref 0.7–4.0)
MCHC: 32.3 g/dL (ref 30.0–36.0)
MCV: 85.8 fl (ref 78.0–100.0)
Monocytes Absolute: 0.9 K/uL (ref 0.1–1.0)
Monocytes Relative: 11.1 % (ref 3.0–12.0)
Neutro Abs: 5.3 K/uL (ref 1.4–7.7)
Neutrophils Relative %: 64 % (ref 43.0–77.0)
Platelets: 217 K/uL (ref 150.0–400.0)
RBC: 5.32 Mil/uL (ref 4.22–5.81)
RDW: 15.4 % (ref 11.5–15.5)
WBC: 8.3 K/uL (ref 4.0–10.5)

## 2024-08-19 LAB — POCT GLYCOSYLATED HEMOGLOBIN (HGB A1C): HbA1c POC (<> result, manual entry): 6.1 % (ref 4.0–5.6)

## 2024-08-19 LAB — LIPID PANEL
Cholesterol: 128 mg/dL (ref 0–200)
HDL: 54.1 mg/dL (ref >39.00)
LDL Cholesterol: 59 mg/dL (ref 0–99)
NonHDL: 73.68
Total CHOL/HDL Ratio: 2
Triglycerides: 75 mg/dL (ref 0.0–149.0)
VLDL: 15 mg/dL (ref 0.0–40.0)

## 2024-08-19 MED ORDER — WEGOVY 0.25 MG/0.5ML ~~LOC~~ SOAJ: 0.2500 mg | SUBCUTANEOUS | 3 refills | Status: DC

## 2024-08-19 NOTE — Assessment & Plan Note (Signed)
 Recently evaluated by orthopedic surgeon Not a surgical candidate Had both knees injected recently Stable.  Pain management discussed.

## 2024-08-19 NOTE — Assessment & Plan Note (Signed)
 No quality sleep.  Daytime somnolence. As per wife: Snores loudly and has episodes of apnea Recommend sleep studies Referral placed today

## 2024-08-19 NOTE — Progress Notes (Signed)
 [name] 78 y.o.   Chief Complaint  Patient presents with   Follow-up    Pt states that both knees are hurting more, balance has not been good    HISTORY OF PRESENT ILLNESS: This is a 78 y.o. male here for follow-up of multiple chronic medical conditions Would like to get started on GLP-1 agonists Concerned about sleep apnea.  Daytime somnolence.  Snores loudly per wife.  Wife has noticed episodes of apnea at night.   Last orthopedic office visit last September assessment and plan as follows: 78 year old male with bilateral knee pain from osteoarthritis but he has history of significant and severe cardiac disease preventing him from proceeding to knee replacement surgery he received an injection in June 2025 got a little bit of relief in the right knee and fairly good relief on the left   After discussion with him and his wife regarding his medical situation and his anticoagulation both of us  agree that he should not proceed with surgery   We decided to inject his right knee  HPI   Prior to Admission medications   Medication Sig Start Date End Date Taking? Authorizing Provider  acetaminophen  (TYLENOL ) 325 MG tablet Take 2 tablets (650 mg total) by mouth every 6 (six) hours as needed for mild pain (or Fever >/= 101). 08/24/19   Pearlean Manus, MD  ALPRAZolam  (XANAX ) 0.5 MG tablet Take 1 tablet (0.5 mg total) by mouth daily as needed for anxiety. 08/17/23   Purcell Emil Schanz, MD  Docusate Sodium  (COLACE PO) Take by mouth daily.    [provider]  lisinopril  (ZESTRIL ) 10 MG tablet TAKE 1 TABLET BY MOUTH EVERY DAY 08/01/23   Chaniece Barbato Jose, MD  metFORMIN  (GLUCOPHAGE ) 500 MG tablet Take 1 tablet (500 mg total) by mouth 2 (two) times daily with a meal. 02/14/24   Purcell Emil Schanz, MD  pantoprazole  (PROTONIX ) 40 MG tablet TAKE 1 TABLET BY MOUTH EVERY DAY 07/19/24   Purcell Emil Schanz, MD  rosuvastatin  (CRESTOR ) 20 MG tablet TAKE 1 TABLET BY MOUTH EVERY DAY  10/29/23   Purcell Emil Schanz, MD  Sennosides (SENOKOT PO) Take by mouth daily.    [provider]  traMADol  (ULTRAM ) 50 MG tablet TAKE 1 TABLET BY MOUTH EVERY 6 HOURS AS NEEDED. 06/17/24   Purcell Emil Schanz, MD  XARELTO  20 MG TABS tablet TAKE 1 TABLET BY MOUTH DAILY WITH SUPPER 08/05/24   Vonnie Spagnolo Jose, MD    No Known Allergies  Patient Active Problem List   Diagnosis Date Noted   Chronic pain of both knees 02/14/2024   Morbid obesity (HCC) 02/15/2023   Prediabetes 02/15/2023   History of pulmonary embolism 02/25/2020   Current use of long term anticoagulation 02/25/2020   Pulmonary nodules/lesions, multiple 02/25/2020   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   CAD in native artery 08/31/2016   Essential hypertension 08/31/2016   Dyslipidemia 08/31/2016   Family history of heart disease in male family member before age 61 08/31/2016   Hearing loss, bilateral 08/31/2016   Generalized anxiety disorder 08/31/2016   History of colonic polyps 08/31/2016    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  Past Surgical History:  Procedure Laterality Date   CORONARY ANGIOPLASTY WITH STENT PLACEMENT     ENDOVENOUS ABLATION SAPHENOUS VEIN W/ LASER Left 06/02/2022   endovenous laser ablation left greater saphenous vein by Penne Colorado MD   FRACTURE SURGERY  1995   right elbow   KNEE ARTHROSCOPY W/ MENISCAL REPAIR      Social History   Socioeconomic History   Marital status: Married    Spouse name: rose   Number of children: 2   Years of education: 13   Highest education level: Some college, no degree  Occupational History   Occupation: retired    Comment: packing/shipping  Tobacco Use   Smoking  status: Former    Current packs/day: 0.00    Average packs/day: 0.3 packs/day for 3.0 years (0.9 ttl pk-yrs)    Types: Cigarettes    Start date: 08/31/1966    Quit date: 08/31/1969    Years since quitting: 55.0   Smokeless tobacco: Never  Vaping Use   Vaping status: Never Used  Substance and Sexual Activity   Alcohol use: Yes    Alcohol/week: 3.0 standard drinks of alcohol    Types: 2 Cans of beer, 1 Shots of liquor per week    Comment: occassionally   Drug use: No   Sexual activity: Yes  Other Topics Concern   Not on file  Social History Narrative   Not on file   Social Drivers of Health   Financial Resource Strain: Low Risk  (08/15/2024)   Overall Financial Resource Strain (CARDIA)    Difficulty of Paying Living Expenses: Not hard at all  Food Insecurity: No Food Insecurity (08/15/2024)   Hunger Vital Sign    Worried About Running Out of Food in the Last Year: Never true    Ran Out of Food in the Last Year: Never true  Transportation Needs: No Transportation Needs (08/15/2024)   PRAPARE - Administrator, Civil Service (Medical): No    Lack of Transportation (Non-Medical): No  Physical Activity: Inactive (08/15/2024)   Exercise Vital Sign    Days of Exercise per Week: 0 days    Minutes of Exercise per Session: Not on file  Stress: No Stress Concern Present (08/15/2024)   [employer] of Occupational Health - Occupational Stress Questionnaire    Feeling of Stress: Only a little  Social Connections: Moderately Isolated (08/15/2024)   Social Connection and Isolation Panel    Frequency of Communication with Friends and Family: Patient declined    Frequency of Social Gatherings with Friends and Family: More than three times a week    Attends Religious Services: Never    Database Administrator or Organizations: No    Attends Engineer, Structural: Not on file    Marital Status: Married  Catering Manager Violence: Not At Risk (07/07/2023)   Humiliation,  Afraid, Rape, and Kick questionnaire    Fear of Current or Ex-Partner: No    Emotionally Abused: No    Physically Abused: No    Sexually Abused: No    Family History  Problem Relation Age of Onset   Heart disease Mother 30       heart attack   Lung disease Father        asbestosis   Clotting disorder Father    Asthma Daughter    Hyperlipidemia Daughter    Alcohol abuse Paternal Uncle    Cancer Paternal Grandfather        lung  Review of Systems  Constitutional: Negative.  Negative for chills and fever.  HENT: Negative.  Negative for congestion and sore throat.   Respiratory: Negative.  Negative for cough and shortness of breath.   Cardiovascular: Negative.  Negative for chest pain and palpitations.  Gastrointestinal:  Negative for abdominal pain, diarrhea, nausea and vomiting.  Genitourinary: Negative.  Negative for dysuria and hematuria.  Musculoskeletal:  Positive for joint pain.  Skin: Negative.  Negative for rash.  Neurological: Negative.  Negative for dizziness and headaches.  All other systems reviewed and are negative.   Today's Vitals   08/19/24 1300 08/19/24 1330  BP: (!) 140/100 120/70  Pulse: 71   Temp: 97.9 F (36.6 C)   TempSrc: Oral   SpO2: 97%   Weight: (!) 317 lb (143.8 kg)   Height: 6' 2 (1.88 m)    Body mass index is 40.7 kg/m.    Physical Exam Vitals reviewed.  Constitutional:      Appearance: Normal appearance.  HENT:     Head: Normocephalic.     Mouth/Throat:     Mouth: Mucous membranes are moist.     Pharynx: Oropharynx is clear.  Eyes:     Extraocular Movements: Extraocular movements intact.  Cardiovascular:     Rate and Rhythm: Normal rate and regular rhythm.     Pulses: Normal pulses.     Heart sounds: Normal heart sounds.  Pulmonary:     Effort: Pulmonary effort is normal.     Breath sounds: Normal breath sounds.  Skin:    General: Skin is warm and dry.     Capillary Refill: Capillary refill takes less than 2  seconds.     Comments: Stasis dermatitis changes on both lower extremities from chronic venous insufficiency  Neurological:     General: No focal deficit present.     Mental Status: He is alert and oriented to person, place, and time.  Psychiatric:        Mood and Affect: Mood normal.        Behavior: Behavior normal.    Results for orders placed or performed in visit on 08/19/24 (from the past 24 hours)  POCT HgB A1C     Status: Abnormal   Collection Time: 08/19/24  1:23 PM  Result Value Ref Range   Hemoglobin A1C     HbA1c POC (<> result, manual entry) 6.1 4.0 - 5.6 %   HbA1c, POC (prediabetic range)     HbA1c, POC (controlled diabetic range)    Lipid panel     Status: None   Collection Time: 08/19/24  1:46 PM  Result Value Ref Range   Cholesterol 128 0 - 200 mg/dL   Triglycerides 24.9 0.0 - 149.0 mg/dL   HDL 45.89 >60.99 mg/dL   VLDL 84.9 0.0 - 59.9 mg/dL   LDL Cholesterol 59 0 - 99 mg/dL   Total CHOL/HDL Ratio 2    NonHDL 73.68   Comprehensive metabolic panel with GFR     Status: Abnormal   Collection Time: 08/19/24  1:46 PM  Result Value Ref Range   Sodium 140 135 - 145 mEq/L   Potassium 4.4 3.5 - 5.1 mEq/L   Chloride 105 96 - 112 mEq/L   CO2 27 19 - 32 mEq/L   Glucose, Bld 102 (H) 70 - 99 mg/dL   BUN 25 (H) 6 - 23 mg/dL   Creatinine, Ser 9.05 0.40 - 1.50 mg/dL   Total Bilirubin 0.4 0.2 - 1.2 mg/dL   Alkaline  Phosphatase 53 39 - 117 U/L   AST 17 0 - 37 U/L   ALT 21 0 - 53 U/L   Total Protein 7.1 6.0 - 8.3 g/dL   Albumin 4.4 3.5 - 5.2 g/dL   GFR 21.22 >39.99 mL/min   Calcium  9.6 8.4 - 10.5 mg/dL  CBC with Differential/Platelet     Status: None   Collection Time: 08/19/24  1:46 PM  Result Value Ref Range   WBC 8.3 4.0 - 10.5 K/uL   RBC 5.32 4.22 - 5.81 Mil/uL   Hemoglobin 14.7 13.0 - 17.0 g/dL   HCT 54.3 60.9 - 47.9 %   MCV 85.8 78.0 - 100.0 fl   MCHC 32.3 30.0 - 36.0 g/dL   RDW 84.5 88.4 - 84.4 %   Platelets 217.0 150.0 - 400.0 K/uL   Neutrophils Relative  % 64.0 43.0 - 77.0 %   Lymphocytes Relative 20.0 12.0 - 46.0 %   Monocytes Relative 11.1 3.0 - 12.0 %   Eosinophils Relative 4.2 0.0 - 5.0 %   Basophils Relative 0.7 0.0 - 3.0 %   Neutro Abs 5.3 1.4 - 7.7 K/uL   Lymphs Abs 1.7 0.7 - 4.0 K/uL   Monocytes Absolute 0.9 0.1 - 1.0 K/uL   Eosinophils Absolute 0.3 0.0 - 0.7 K/uL   Basophils Absolute 0.1 0.0 - 0.1 K/uL     ASSESSMENT & PLAN: A total of 45 minutes was spent with the patient and counseling/coordination of care regarding preparing for this visit, review of most recent office visit notes, review of multiple chronic medical conditions and their management, review of all medications, review of most recent bloodwork results including interpretation of today's hemoglobin A1c, review of health maintenance items, education on nutrition, prognosis, documentation, and need for follow up.   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 Continue lisinopril  10 mg Dietary approaches to stop hypertension discussed Cardiovascular risks associated with hypertension discussed Well-controlled diabetes with hemoglobin A1c is 6.1 Continues metformin  500 mg twice a day Will benefit from GLP-1         Relevant Orders   POCT HgB A1C (Completed)   Chronic venous insufficiency   No signs of cellulitis.  Mild edema. Stable.  No concerns today.      Relevant Orders   CBC with Differential/Platelet     Other   Dyslipidemia   Diet and nutrition discussed Continue rosuvastatin  20 mg daily      Relevant Orders   Comprehensive metabolic panel with GFR   Lipid panel   Generalized anxiety disorder   Stable.  Takes alprazolam  as needed      Malignant melanoma of  back (HCC)   Treated over 10 years ago. No recurrences. No concerns.      Current use of long term anticoagulation   No clinically significant bleeding episodes recently Fall precautions given      Morbid obesity (HCC)   Diet and nutrition discussed Advised to decrease amount of daily carbohydrate intake and daily calories and increase amount of plant-based protein in his diet Will benefit from GLP-1 agonist.  Recommend Tzhncb.      Relevant Medications   semaglutide-weight management (WEGOVY) 0.25 MG/0.5ML SOAJ SQ  injection   Other Relevant Orders   CBC with Differential/Platelet   Comprehensive metabolic panel with GFR   Lipid panel   Chronic pain of both knees   Recently evaluated by orthopedic surgeon Not a surgical candidate Had both knees injected recently Stable.  Pain management discussed.      Suspected sleep apnea   No quality sleep.  Daytime somnolence. As per wife: Snores loudly and has episodes of apnea Recommend sleep studies Referral placed today      Relevant Medications   semaglutide-weight management (WEGOVY) 0.25 MG/0.5ML SOAJ SQ injection   Other Relevant Orders   Ambulatory referral to Sleep Studies     Patient Instructions  Health Maintenance After Age 62 After age 35, you are at a higher risk for certain long-term diseases and infections as well as injuries from falls. Falls are a major cause of broken bones and head injuries in people who are older than age 60. Getting regular preventive care can help to keep you healthy and well. Preventive care includes getting regular testing and making lifestyle changes as recommended by your health care provider. Talk with your health care provider about: Which screenings and tests you should have. A screening is a test that checks for a disease when you have no symptoms. A diet and exercise plan that is right for you. What should I know about screenings and tests to prevent falls? Screening and testing  are the best ways to find a health problem early. Early diagnosis and treatment give you the best chance of managing medical conditions that are common after age 77. Certain conditions and lifestyle choices may make you more likely to have a fall. Your health care provider may recommend: Regular vision checks. Poor vision and conditions such as cataracts can make you more likely to have a fall. If you wear glasses, make sure to get your prescription updated if your vision changes. Medicine review. Work with your health care provider to regularly review all of the medicines you are taking, including over-the-counter medicines. Ask your health care provider about any side effects that may make you more likely to have a fall. Tell your health care provider if any medicines that you take make you feel dizzy or sleepy. Strength and balance checks. Your health care provider may recommend certain tests to check your strength and balance while standing, walking, or changing positions. Foot health exam. Foot pain and numbness, as well as not wearing proper footwear, can make you more likely to have a fall. Screenings, including: Osteoporosis screening. Osteoporosis is a condition that causes the bones to get weaker and break more easily. Blood pressure screening. Blood pressure changes and medicines to control blood pressure can make you feel dizzy. Depression screening. You may be more likely to have a fall if you have a fear of falling, feel depressed, or feel unable to do activities that you used to do. Alcohol use screening. Using too much alcohol can affect your balance and may make you more likely to have a fall. Follow these instructions at home: Lifestyle Do not drink alcohol if: Your health care provider tells you not to drink. If you drink alcohol: Limit how much you have to: 0-1 drink a day for women. 0-2 drinks a day for men. Know how much alcohol is in your drink. In the [place] one drink equals  one 12 oz bottle of beer (355 mL), one 5 oz glass of wine (148 mL), or one 1 oz glass of  hard liquor (44 mL). Do not use any products that contain nicotine or tobacco. These products include cigarettes, chewing tobacco, and vaping devices, such as e-cigarettes. If you need help quitting, ask your health care provider. Activity  Follow a regular exercise program to stay fit. This will help you maintain your balance. Ask your health care provider what types of exercise are appropriate for you. If you need a cane or walker, use it as recommended by your health care provider. Wear supportive shoes that have nonskid soles. Safety  Remove any tripping hazards, such as rugs, cords, and clutter. Install safety equipment such as grab bars in bathrooms and safety rails on stairs. Keep rooms and walkways well-lit. General instructions Talk with your health care provider about your risks for falling. Tell your health care provider if: You fall. Be sure to tell your health care provider about all falls, even ones that seem minor. You feel dizzy, tiredness (fatigue), or off-balance. Take over-the-counter and prescription medicines only as told by your health care provider. These include supplements. Eat a healthy diet and maintain a healthy weight. A healthy diet includes low-fat dairy products, low-fat (lean) meats, and fiber from whole grains, beans, and lots of fruits and vegetables. Stay current with your vaccines. Schedule regular health, dental, and eye exams. Summary Having a healthy lifestyle and getting preventive care can help to protect your health and wellness after age 89. Screening and testing are the best way to find a health problem early and help you avoid having a fall. Early diagnosis and treatment give you the best chance for managing medical conditions that are more common for people who are older than age 28. Falls are a major cause of broken bones and head injuries in people who are  older than age 22. Take precautions to prevent a fall at home. Work with your health care provider to learn what changes you can make to improve your health and wellness and to prevent falls. This information is not intended to replace advice given to you by your health care provider. Make sure you discuss any questions you have with your health care provider. Document Revised: 02/15/2021 Document Reviewed: 02/15/2021 Elsevier Patient Education  2024 Elsevier Inc.     Emil Schaumann, MD [facility]

## 2024-08-19 NOTE — Assessment & Plan Note (Signed)
 Results for orders placed or performed in visit on 08/19/24 (from the past 24 hours)  POCT HgB A1C     Status: Abnormal   Collection Time: 08/19/24  1:23 PM  Result Value Ref Range   Hemoglobin A1C     HbA1c POC (<> result, manual entry) 6.1 4.0 - 5.6 %   HbA1c, POC (prediabetic range)     HbA1c, POC (controlled diabetic range)    Diet and nutrition discussed

## 2024-08-19 NOTE — Assessment & Plan Note (Signed)
 Treated over 10 years ago. No recurrences. No concerns.

## 2024-08-19 NOTE — Assessment & Plan Note (Signed)
 No signs of cellulitis.  Mild edema. Stable.  No concerns today.

## 2024-08-19 NOTE — Patient Instructions (Signed)
 Health Maintenance After Age 76 After age 27, you are at a higher risk for certain long-term diseases and infections as well as injuries from falls. Falls are a major cause of broken bones and head injuries in people who are older than age 73. Getting regular preventive care can help to keep you healthy and well. Preventive care includes getting regular testing and making lifestyle changes as recommended by your health care provider. Talk with your health care provider about: Which screenings and tests you should have. A screening is a test that checks for a disease when you have no symptoms. A diet and exercise plan that is right for you. What should I know about screenings and tests to prevent falls? Screening and testing are the best ways to find a health problem early. Early diagnosis and treatment give you the best chance of managing medical conditions that are common after age 90. Certain conditions and lifestyle choices may make you more likely to have a fall. Your health care provider may recommend: Regular vision checks. Poor vision and conditions such as cataracts can make you more likely to have a fall. If you wear glasses, make sure to get your prescription updated if your vision changes. Medicine review. Work with your health care provider to regularly review all of the medicines you are taking, including over-the-counter medicines. Ask your health care provider about any side effects that may make you more likely to have a fall. Tell your health care provider if any medicines that you take make you feel dizzy or sleepy. Strength and balance checks. Your health care provider may recommend certain tests to check your strength and balance while standing, walking, or changing positions. Foot health exam. Foot pain and numbness, as well as not wearing proper footwear, can make you more likely to have a fall. Screenings, including: Osteoporosis screening. Osteoporosis is a condition that causes  the bones to get weaker and break more easily. Blood pressure screening. Blood pressure changes and medicines to control blood pressure can make you feel dizzy. Depression screening. You may be more likely to have a fall if you have a fear of falling, feel depressed, or feel unable to do activities that you used to do. Alcohol  use screening. Using too much alcohol  can affect your balance and may make you more likely to have a fall. Follow these instructions at home: Lifestyle Do not drink alcohol  if: Your health care provider tells you not to drink. If you drink alcohol : Limit how much you have to: 0-1 drink a day for women. 0-2 drinks a day for men. Know how much alcohol  is in your drink. In the [place] one drink equals one 12 oz bottle of beer (355 mL), one 5 oz glass of wine (148 mL), or one 1 oz glass of hard liquor (44 mL). Do not use any products that contain nicotine or tobacco. These products include cigarettes, chewing tobacco, and vaping devices, such as e-cigarettes. If you need help quitting, ask your health care provider. Activity  Follow a regular exercise program to stay fit. This will help you maintain your balance. Ask your health care provider what types of exercise are appropriate for you. If you need a cane or walker, use it as recommended by your health care provider. Wear supportive shoes that have nonskid soles. Safety  Remove any tripping hazards, such as rugs, cords, and clutter. Install safety equipment such as grab bars in bathrooms and safety rails on stairs. Keep rooms and walkways  well-lit. General instructions Talk with your health care provider about your risks for falling. Tell your health care provider if: You fall. Be sure to tell your health care provider about all falls, even ones that seem minor. You feel dizzy, tiredness (fatigue), or off-balance. Take over-the-counter and prescription medicines only as told by your health care provider. These include  supplements. Eat a healthy diet and maintain a healthy weight. A healthy diet includes low-fat dairy products, low-fat (lean) meats, and fiber from whole grains, beans, and lots of fruits and vegetables. Stay current with your vaccines. Schedule regular health, dental, and eye exams. Summary Having a healthy lifestyle and getting preventive care can help to protect your health and wellness after age 15. Screening and testing are the best way to find a health problem early and help you avoid having a fall. Early diagnosis and treatment give you the best chance for managing medical conditions that are more common for people who are older than age 42. Falls are a major cause of broken bones and head injuries in people who are older than age 64. Take precautions to prevent a fall at home. Work with your health care provider to learn what changes you can make to improve your health and wellness and to prevent falls. This information is not intended to replace advice given to you by your health care provider. Make sure you discuss any questions you have with your health care provider. Document Revised: 02/15/2021 Document Reviewed: 02/15/2021 Elsevier Patient Education  2024 [employer].

## 2024-08-19 NOTE — Assessment & Plan Note (Signed)
 Diet and nutrition discussed Advised to decrease amount of daily carbohydrate intake and daily calories and increase amount of plant-based protein in his diet Will benefit from GLP-1 agonist.  Recommend Tzhncb.

## 2024-08-19 NOTE — Assessment & Plan Note (Signed)
No recent anginal episodes On long-term anticoagulation for pulmonary embolism

## 2024-08-19 NOTE — Assessment & Plan Note (Signed)
No clinically significant bleeding episodes recently Fall precautions given

## 2024-08-19 NOTE — Assessment & Plan Note (Addendum)
 Elevated blood pressure reading in the office today but normal readings at home. Continue lisinopril  10 mg Dietary approaches to stop hypertension discussed Cardiovascular risks associated with hypertension discussed Well-controlled diabetes with hemoglobin A1c is 6.1 Continues metformin  500 mg twice a day Will benefit from GLP-1

## 2024-08-19 NOTE — Assessment & Plan Note (Signed)
Stable.  Takes alprazolam as needed

## 2024-08-19 NOTE — Assessment & Plan Note (Signed)
 Diet and nutrition discussed.  Continue rosuvastatin 20 mg daily.

## 2024-09-24 ENCOUNTER — Institutional Professional Consult (permissible substitution): Admitting: Neurology

## 2024-09-26 ENCOUNTER — Encounter: Payer: Self-pay | Admitting: Neurology

## 2024-09-26 ENCOUNTER — Ambulatory Visit: Admitting: Neurology

## 2024-09-26 VITALS — BP 130/79 | HR 59 | Ht 74.0 in | Wt 303.4 lb

## 2024-09-26 DIAGNOSIS — R351 Nocturia: Secondary | ICD-10-CM | POA: Diagnosis not present

## 2024-09-26 DIAGNOSIS — R0681 Apnea, not elsewhere classified: Secondary | ICD-10-CM

## 2024-09-26 DIAGNOSIS — R0683 Snoring: Secondary | ICD-10-CM

## 2024-09-26 DIAGNOSIS — E669 Obesity, unspecified: Secondary | ICD-10-CM

## 2024-09-26 DIAGNOSIS — Z9189 Other specified personal risk factors, not elsewhere classified: Secondary | ICD-10-CM | POA: Diagnosis not present

## 2024-09-26 DIAGNOSIS — G4719 Other hypersomnia: Secondary | ICD-10-CM

## 2024-09-26 DIAGNOSIS — R001 Bradycardia, unspecified: Secondary | ICD-10-CM | POA: Diagnosis not present

## 2024-09-26 DIAGNOSIS — Z955 Presence of coronary angioplasty implant and graft: Secondary | ICD-10-CM | POA: Diagnosis not present

## 2024-09-26 NOTE — Progress Notes (Signed)
 Subjective:    Patient ID: [name] is a 78 y.o. male.  HPI    True Mar, MD, PhD [facility] [address], [address] [address] [place], [place] [address]  Dear Dr. Purcell,   I saw your patient, [name], upon your kind request in my sleep clinic today for initial consultation of his sleep disorder, in particular, concern for underlying obstructive sleep apnea.  The patient is accompanied by his wife today.  As you know, [name] is a 78 year old male with an underlying complex medical history of hypertension, hyperlipidemia, coronary artery disease with status post stent placements, arthritis, chronic pain, prediabetes, prior smoking, hearing loss, history of lower GI bleed, melanoma, and obesity, who reports snoring and excessive daytime somnolence.  His Epworth sleepiness score is 12 out of 24, fatigue severity score is 51 out of 63 but his sleepiness score may be an underestimation based on his wife's report that he dozes off and on throughout the day.  He has witnessed apneas per wife's report and makes gasping sounds.  History is supplemented by his wife.  I reviewed your office note from 08/19/2024.  He is working on weight loss.  In fact, over the past month since starting Ozempic he has lost about 15 pounds.  He lost a lot of weight over the course of 9 years in the past.  His son has sleep apnea.  He is retired.  He does not keep a set schedule for nighttime sleep or rise time.  They have 2 dogs in the household and often he has to let the dogs out at night.  He has significant nocturia about 3-4 times per average night.  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  He drinks quite a bit of caffeine in the form of coffee, 5 or 6 cups/day.  He drinks alcohol rarely.  He quit smoking over 50 years ago.  He has arthritis in both knees and recently got an injection into the left knee. He denies any recurrent nocturnal or  morning headaches.  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    His Past Surgical History Is Significant For: Past Surgical History:  Procedure Laterality Date   CORONARY ANGIOPLASTY WITH STENT PLACEMENT     ENDOVENOUS ABLATION SAPHENOUS VEIN W/ LASER Left 06/02/2022   endovenous laser ablation left greater saphenous vein by Penne Colorado MD   FRACTURE SURGERY  1995   right elbow   KNEE ARTHROSCOPY W/ MENISCAL REPAIR      His Family History Is Significant For: Family History  Problem Relation Age of Onset   Heart disease Mother 35       heart attack   Lung disease Father        asbestosis   Clotting disorder Father    Cancer Paternal Grandfather        lung   Asthma Daughter    Hyperlipidemia Daughter    Alcohol abuse Paternal Uncle    Sleep apnea Other    Migraines Neg Hx    Seizures Neg Hx    Stroke  Neg Hx     His Social History Is Significant For: Social History   Socioeconomic History   Marital status: Married    Spouse name: rose   Number of children: 2   Years of education: 13   Highest education level: Some college, no degree  Occupational History   Occupation: retired    Comment: packing/shipping  Tobacco Use   Smoking status: Former    Current packs/day: 0.00    Average packs/day: 0.3 packs/day for 3.0 years (0.9 ttl pk-yrs)    Types: Cigarettes    Start date: 08/31/1966    Quit date: 08/31/1969    Years since quitting: 55.1   Smokeless tobacco: Never  Vaping Use   Vaping status: Never Used  Substance and Sexual Activity   Alcohol use: Yes    Alcohol/week: 3.0 standard drinks of alcohol    Types: 2 Cans of beer, 1 Shots of liquor per week     Comment: occassionally   Drug use: No   Sexual activity: Yes  Other Topics Concern   Not on file  Social History Narrative   3-6 cups of coffee daily, some tea    Social Drivers of Health   Tobacco Use: Medium Risk (09/26/2024)   Patient History    Smoking Tobacco Use: Former    Smokeless Tobacco Use: Never    Passive Exposure: Not on file  Financial Resource Strain: Low Risk (08/15/2024)   Overall Financial Resource Strain (CARDIA)    Difficulty of Paying Living Expenses: Not hard at all  Food Insecurity: No Food Insecurity (08/15/2024)   Epic    Worried About Programme Researcher, Broadcasting/film/video in the Last Year: Never true    Ran Out of Food in the Last Year: Never true  Transportation Needs: No Transportation Needs (08/15/2024)   Epic    Lack of Transportation (Medical): No    Lack of Transportation (Non-Medical): No  Physical Activity: Inactive (08/15/2024)   Exercise Vital Sign    Days of Exercise per Week: 0 days    Minutes of Exercise per Session: Not on file  Stress: No Stress Concern Present (08/15/2024)   [employer] of Occupational Health - Occupational Stress Questionnaire    Feeling of Stress: Only a little  Social Connections: Moderately Isolated (08/15/2024)   Social Connection and Isolation Panel    Frequency of Communication with Friends and Family: Patient declined    Frequency of Social Gatherings with Friends and Family: More than three times a week    Attends Religious Services: Never    Database Administrator or Organizations: No    Attends Engineer, Structural: Not on file    Marital Status: Married  Depression (PHQ2-9): Low Risk (08/19/2024)   Depression (PHQ2-9)    PHQ-2 Score: 0  Alcohol Screen: Low Risk (08/15/2024)   Alcohol Screen    Last Alcohol Screening Score (AUDIT): 1  Housing: Low Risk (08/15/2024)   Epic    Unable to Pay for Housing in the Last Year: No    Number of Times Moved in the Last Year: 0    Homeless in the Last Year: No   Utilities: Not At Risk (07/07/2023)   [facility] Utilities    Threatened with loss of utilities: No  Health Literacy: Adequate Health Literacy (07/07/2023)   B1300 Health Literacy    Frequency of need for help with medical instructions: Rarely    His Allergies Are:  Allergies[1]:   His Current Medications Are:  Outpatient Encounter  Medications as of 09/26/2024  Medication Sig   acetaminophen  (TYLENOL ) 325 MG tablet Take 2 tablets (650 mg total) by mouth every 6 (six) hours as needed for mild pain (or Fever >/= 101).   ALPRAZolam  (XANAX ) 0.5 MG tablet Take 1 tablet (0.5 mg total) by mouth daily as needed for anxiety.   Docusate Sodium  (COLACE PO) Take by mouth daily.   lisinopril  (ZESTRIL ) 10 MG tablet TAKE 1 TABLET BY MOUTH EVERY DAY   metFORMIN  (GLUCOPHAGE ) 500 MG tablet Take 1 tablet (500 mg total) by mouth 2 (two) times daily with a meal.   pantoprazole  (PROTONIX ) 40 MG tablet TAKE 1 TABLET BY MOUTH EVERY DAY   rosuvastatin  (CRESTOR ) 20 MG tablet TAKE 1 TABLET BY MOUTH EVERY DAY   Semaglutide ,0.25 or 0.5MG /DOS, (OZEMPIC, 0.25 OR 0.5 MG/DOSE,) 2 MG/3ML SOPN Inject 0.25 mg into the skin once a week.   Sennosides (SENOKOT PO) Take by mouth daily.   traMADol  (ULTRAM ) 50 MG tablet TAKE 1 TABLET BY MOUTH EVERY 6 HOURS AS NEEDED.   XARELTO  20 MG TABS tablet TAKE 1 TABLET BY MOUTH DAILY WITH SUPPER   No facility-administered encounter medications on file as of 09/26/2024.  :   Review of Systems:  Out of a complete 14 point review of systems, all are reviewed and negative with the exception of these symptoms as listed below:  Review of Systems  Objective:  Neurological Exam  Physical Exam Physical Examination:   Vitals:   09/26/24 1049  BP: 130/79  Pulse: (!) 59    General Examination: The patient is a very pleasant 78 y.o. male in no acute distress. He appears well-developed and well-nourished and well groomed.   HEENT: Normocephalic, atraumatic, pupils are equal, round and  reactive to light, extraocular tracking is good without limitation to gaze excursion or nystagmus noted. No photophobia.  Corrective eye glasses in place. Hearing is impaired, left hearing aid in place.    Face is symmetric with normal facial animation. Speech with mild impediment. There is no lip, neck/head, jaw or voice tremor. Neck is supple with full range of passive and active motion. There are no carotid bruits on auscultation.  Airway/Oropharynx exam reveals: mild mouth dryness, adequate dental hygiene and moderate airway crowding, due to small airway entry and redundant soft palate, tonsils 1+ bilaterally, Mallampati class III, neck circumference 19 5/8 inches.  Minimal overbite noted.  Tongue protrudes centrally and palate elevates symmetrically.  Chest: Clear to auscultation without wheezing, rhonchi or crackles noted.  Heart: S1+S2+0, regular and normal without murmurs, rubs or gallops noted.   Abdomen: Soft, non-tender and non-distended.  Extremities: There is 1+ pitting edema in the distal lower extremities bilaterally.   Skin: Warm and dry without trophic changes noted.   Musculoskeletal: exam reveals no obvious joint deformities.   Neurologically:  Mental status: The patient is awake, alert and oriented in all 4 spheres. His immediate and remote memory, attention, language skills and fund of knowledge are appropriate. There is no evidence of aphasia, agnosia, apraxia or anomia. Speech is clear with normal prosody and enunciation. Thought process is linear. Mood is normal and affect is normal.  Cranial nerves II - XII are as described above under HEENT exam.  Motor exam: Normal bulk, strength and tone is noted. There is no obvious action or resting tremor.  Fine motor skills and coordination: Intact grossly.  Cerebellar testing: No dysmetria or intention tremor. There is no truncal or gait ataxia.  Sensory exam: intact to light touch  in the upper and lower extremities.  Gait,  station and balance: He stands slowly and walks with a single-point cane.  Assessment and Plan:  In summary, Brennen Camper is a very pleasant 78 y.o.-year old male with an underlying complex medical history of hypertension, hyperlipidemia, coronary artery disease with status post stent placements, arthritis, chronic pain, prediabetes, prior smoking, hearing loss, history of lower GI bleed, melanoma, and obesity, whose history and physical exam are concerning for sleep disordered breathing, particularly obstructive sleep apnea (OSA). A laboratory attended sleep study is typically considered gold standard for evaluation of sleep disordered breathing.   I had a long chat with the patient and his wife about my findings and the diagnosis of sleep apnea, particularly OSA, its prognosis and treatment options. We talked about medical/conservative treatments, surgical interventions and non-pharmacological approaches for symptom control. I explained, in particular, the risks and ramifications of untreated moderate to severe OSA, especially with respect to developing cardiovascular disease down the road, including congestive heart failure (CHF), difficult to treat hypertension, cardiac arrhythmias (particularly A-fib), neurovascular complications including TIA, stroke and dementia. Even type 2 diabetes has, in part, been linked to untreated OSA. Symptoms of untreated OSA may include (but may not be limited to) daytime sleepiness, nocturia (i.e. frequent nighttime urination), memory problems, mood irritability and suboptimally controlled or worsening mood disorder such as depression and/or anxiety, lack of energy, lack of motivation, physical discomfort, as well as recurrent headaches, especially morning or nocturnal headaches. We talked about the importance of maintaining a healthy lifestyle and striving for healthy weight.  In addition, we talked about the importance of striving for and maintaining good sleep  hygiene. I recommended a sleep study at this time. I outlined the differences between a laboratory attended sleep study which is considered more comprehensive and accurate over the option of a home sleep test (HST); the latter may lead to underestimation of sleep disordered breathing in some instances and does not help with diagnosing upper airway resistance syndrome and is not accurate enough to diagnose primary central sleep apnea typically. I outlined possible surgical and non-surgical treatment options of OSA, including the use of a positive airway pressure (PAP) device (i.e. CPAP, AutoPAP/APAP or BiPAP in certain circumstances), a custom-made dental device (aka oral appliance, which would require a referral to a specialist dentist or orthodontist typically, and is generally speaking not considered for patients with full dentures or edentulous state), upper airway surgical options, such as traditional UPPP (which is not considered a first-line treatment) or the Inspire device (hypoglossal nerve stimulator, which would involve a referral for consultation with an ENT surgeon, after careful selection, following inclusion criteria - also not first-line treatment). I explained the PAP treatment option to the patient in detail, as this is generally considered first-line treatment.  The patient indicated that he would be willing to try PAP therapy, if the need arises. I explained the importance of being compliant with PAP treatment, not only for insurance purposes but primarily to improve patient's symptoms symptoms, and for the patient's long term health benefit, including to reduce His cardiovascular risks longer-term.    We will pick up our discussion about the next steps and treatment options after testing.  We will keep them posted as to the test results by phone call and/or MyChart messaging where possible.  We will plan to follow-up in sleep clinic accordingly as well.  I answered all their questions today  and the patient and his wife were in agreement.  I encouraged them to call with any interim questions, concerns, problems or updates or email us  through MyChart.  Generally speaking, sleep test authorizations may take up to 2 weeks, sometimes less, sometimes longer, the patient is encouraged to get in touch with us  if they do not hear back from the sleep lab staff directly within the next 2 weeks.  Thank you very much for allowing me to participate in the care of this nice patient. If I can be of any further assistance to you please do not hesitate to call me at [phone].  Sincerely,   True Mar, MD, PhD     [1] No Known Allergies

## 2024-09-26 NOTE — Patient Instructions (Signed)

## 2024-09-27 ENCOUNTER — Other Ambulatory Visit: Payer: Self-pay | Admitting: Emergency Medicine

## 2024-10-06 ENCOUNTER — Other Ambulatory Visit: Payer: Self-pay | Admitting: Emergency Medicine

## 2024-10-14 DIAGNOSIS — R222 Localized swelling, mass and lump, trunk: Secondary | ICD-10-CM

## 2024-10-17 ENCOUNTER — Encounter: Payer: Self-pay | Admitting: General Surgery

## 2024-10-17 ENCOUNTER — Ambulatory Visit: Admitting: General Surgery

## 2024-10-17 ENCOUNTER — Ambulatory Visit (INDEPENDENT_AMBULATORY_CARE_PROVIDER_SITE_OTHER)

## 2024-10-17 VITALS — Ht 74.0 in | Wt 298.0 lb

## 2024-10-17 VITALS — BP 120/72 | HR 60 | Temp 98.2°F | Resp 18 | Ht 74.0 in | Wt 303.0 lb

## 2024-10-17 DIAGNOSIS — Z0000 Encounter for general adult medical examination without abnormal findings: Secondary | ICD-10-CM | POA: Diagnosis not present

## 2024-10-17 DIAGNOSIS — L723 Sebaceous cyst: Secondary | ICD-10-CM

## 2024-10-17 NOTE — Patient Instructions (Signed)
 Mr. Vanwyhe,  Thank you for taking the time for your Medicare Wellness Visit. I appreciate your continued commitment to your health goals. Please review the care plan we discussed, and feel free to reach out if I can assist you further.  Please note that Annual Wellness Visits do not include a physical exam. Some assessments may be limited, especially if the visit was conducted virtually. If needed, we may recommend an in-person follow-up with your provider.  Ongoing Care Seeing your primary care provider every 3 to 6 months helps us  monitor your health and provide consistent, personalized care.   Referrals If a referral was made during today's visit and you haven't received any updates within two weeks, please contact the referred provider directly to check on the status.  Recommended Screenings:  Health Maintenance  Topic Date Due   Complete foot exam   Never done   Eye exam for diabetics  Never done   Yearly kidney health urinalysis for diabetes  Never done   Hepatitis C Screening  Never done   Zoster (Shingles) Vaccine (1 of 2) Never done   Flu Shot  05/10/2024   COVID-19 Vaccine (5 - 2025-26 season) 06/10/2024   Medicare Annual Wellness Visit  07/06/2024   Hemoglobin A1C  02/16/2025   Yearly kidney function blood test for diabetes  08/19/2025   Pneumococcal Vaccine for age over 38  Completed   Meningitis B Vaccine  Aged Out   DTaP/Tdap/Td vaccine  Discontinued   Colon Cancer Screening  Discontinued       10/13/2024   12:14 PM  Advanced Directives  Does Patient Have a Medical Advance Directive? No  Would patient like information on creating a medical advance directive? No - Patient declined    Vision: Annual vision screenings are recommended for early detection of glaucoma, cataracts, and diabetic retinopathy. These exams can also reveal signs of chronic conditions such as diabetes and high blood pressure.  Dental: Annual dental screenings help detect early signs of oral  cancer, gum disease, and other conditions linked to overall health, including heart disease and diabetes.  Please see the attached documents for additional preventive care recommendations.

## 2024-10-17 NOTE — Progress Notes (Signed)
 "  Chief Complaint  Patient presents with   Medicare Wellness     Subjective:   [name] is a 79 y.o. male who presents for a Medicare Annual Wellness Visit.  Visit info / Clinical Intake: Medicare Wellness Visit Type:: Subsequent Annual Wellness Visit Persons participating in visit and providing information:: patient & caregiver Medicare Wellness Visit Mode:: Telephone If telephone:: video declined Since this visit was completed virtually, some vitals may be partially provided or unavailable. Missing vitals are due to the limitations of the virtual format.: Documented vitals are patient reported If Telephone or Video please confirm:: I connected with patient using audio/video enable telemedicine. I verified patient identity with two identifiers, discussed telehealth limitations, and patient agreed to proceed. Patient Location:: home Provider Location:: home office Interpreter Needed?: No Pre-visit prep was completed: yes AWV questionnaire completed by patient prior to visit?: yes Date:: 10/13/24 Living arrangements:: (Patient-Rptd) lives with spouse/significant other Patient's Overall Health Status Rating: (!) (Patient-Rptd) fair Typical amount of pain: (Patient-Rptd) some Does pain affect daily life?: (!) (Patient-Rptd) yes Are you currently prescribed opioids?: no  Dietary Habits and Nutritional Risks How many meals a day?: (Patient-Rptd) 3 Eats fruit and vegetables daily?: (Patient-Rptd) yes Most meals are obtained by: (Patient-Rptd) preparing own meals; eating out; having others provide food In the last 2 weeks, have you had any of the following?: none Diabetic:: (!) yes Any non-healing wounds?: no How often do you check your BS?: 0 Would you like to be referred to a Nutritionist or for Diabetic Management? : no  Functional Status Activities of Daily Living (to include ambulation/medication): (Patient-Rptd) Independent Ambulation: (Patient-Rptd)  Independent Medication Administration: (Patient-Rptd) Independent Home Management (perform basic housework or laundry): (Patient-Rptd) Independent Manage your own finances?: (Patient-Rptd) yes Primary transportation is: (Patient-Rptd) driving; family / friends Concerns about vision?: no *vision screening is required for WTM* Concerns about hearing?: (!) yes Uses hearing aids?: (!) yes  Fall Screening Falls in the past year?: (Patient-Rptd) 1 Number of falls in past year: (Patient-Rptd) 0 Was there an injury with Fall?: (Patient-Rptd) 0 Fall Risk Category Calculator: (Patient-Rptd) 1 Patient Fall Risk Level: (Patient-Rptd) Low Fall Risk  Fall Risk Patient at Risk for Falls Due to: Medication side effect; Impaired balance/gait Fall risk Follow up: Falls prevention discussed; Falls evaluation completed  Home and Transportation Safety: All rugs have non-skid backing?: (Patient-Rptd) yes All stairs or steps have railings?: (Patient-Rptd) yes Grab bars in the bathtub or shower?: (Patient-Rptd) yes Have non-skid surface in bathtub or shower?: (Patient-Rptd) yes Good home lighting?: (Patient-Rptd) yes Regular seat belt use?: (Patient-Rptd) yes Hospital stays in the last year:: (Patient-Rptd) no  Cognitive Assessment Difficulty concentrating, remembering, or making decisions? : (Patient-Rptd) no Will 6CIT or Mini Cog be Completed: yes What year is it?: 0 points What month is it?: 0 points Give patient an address phrase to remember (5 components): [address] [place] About what time is it?: 0 points Count backwards from 20 to 1: 0 points Say the months of the year in reverse: 0 points Repeat the address phrase from earlier: 0 points 6 CIT Score: 0 points  Advance Directives (For Healthcare) Does Patient Have a Medical Advance Directive?: No Would patient like information on creating a medical advance directive?: No - Patient declined  Reviewed/Updated  Reviewed/Updated:  Reviewed All (Medical, Surgical, Family, Medications, Allergies, Care Teams, Patient Goals)    Allergies (verified) Patient has no known allergies.   Current Medications (verified) Outpatient Encounter Medications as of 10/17/2024  Medication  Sig   acetaminophen  (TYLENOL ) 325 MG tablet Take 2 tablets (650 mg total) by mouth every 6 (six) hours as needed for mild pain (or Fever >/= 101).   ALPRAZolam  (XANAX ) 0.5 MG tablet Take 1 tablet (0.5 mg total) by mouth daily as needed for anxiety.   Docusate Sodium  (COLACE PO) Take by mouth daily.   lisinopril  (ZESTRIL ) 10 MG tablet TAKE 1 TABLET BY MOUTH EVERY DAY   metFORMIN  (GLUCOPHAGE ) 500 MG tablet Take 1 tablet (500 mg total) by mouth 2 (two) times daily with a meal.   pantoprazole  (PROTONIX ) 40 MG tablet TAKE 1 TABLET BY MOUTH EVERY DAY   rosuvastatin  (CRESTOR ) 20 MG tablet TAKE 1 TABLET BY MOUTH EVERY DAY   Semaglutide ,0.25 or 0.5MG /DOS, (OZEMPIC, 0.25 OR 0.5 MG/DOSE,) 2 MG/3ML SOPN Inject 0.25 mg into the skin once a week.   Sennosides (SENOKOT PO) Take by mouth daily.   traMADol  (ULTRAM ) 50 MG tablet TAKE 1 TABLET BY MOUTH EVERY 6 HOURS AS NEEDED   XARELTO  20 MG TABS tablet TAKE 1 TABLET BY MOUTH DAILY WITH SUPPER   No facility-administered encounter medications on file as of 10/17/2024.    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   Past Surgical History:  Procedure Laterality Date   CORONARY ANGIOPLASTY WITH STENT PLACEMENT     ENDOVENOUS ABLATION SAPHENOUS VEIN W/ LASER Left 06/02/2022   endovenous laser ablation left greater saphenous vein by Penne Colorado MD   FRACTURE SURGERY  1995   right elbow   KNEE ARTHROSCOPY W/ MENISCAL REPAIR     Family History  Problem  Relation Age of Onset   Heart disease Mother 51       heart attack   Lung disease Father        asbestosis   Clotting disorder Father    Cancer Paternal Grandfather        lung   Asthma Daughter    Hyperlipidemia Daughter    Alcohol abuse Paternal Uncle    Sleep apnea Other    Migraines Neg Hx    Seizures Neg Hx    Stroke Neg Hx    Social History   Occupational History   Occupation: retired    Comment: packing/shipping  Tobacco Use   Smoking status: Former    Current packs/day: 0.00    Average packs/day: 0.3 packs/day for 3.0 years (0.9 ttl pk-yrs)    Types: Cigarettes    Start date: 08/31/1966    Quit date: 08/31/1969    Years since quitting: 55.1   Smokeless tobacco: Never  Vaping Use   Vaping status: Never Used  Substance and Sexual Activity   Alcohol use: Yes    Alcohol/week: 3.0 standard drinks of alcohol    Types: 2 Cans of beer, 1 Shots of liquor per week    Comment: occassionally   Drug use: No   Sexual activity: Yes   Tobacco Counseling Counseling given: Not Answered  SDOH Screenings   Food Insecurity: No Food Insecurity (10/17/2024)  Housing: Unknown (10/17/2024)  Transportation Needs: No Transportation Needs (10/17/2024)  Utilities: Not At Risk (10/17/2024)  Alcohol Screen: Low Risk (10/17/2024)  Depression (PHQ2-9): Low Risk (10/17/2024)  Financial Resource Strain: Low Risk (10/17/2024)  Physical Activity: Inactive (10/17/2024)  Social Connections: Moderately Isolated (10/17/2024)  Stress: No Stress Concern Present (10/17/2024)  Tobacco Use: Medium Risk (10/17/2024)  Health Literacy: Adequate Health Literacy (10/17/2024)   See flowsheets for full screening details  Depression Screen PHQ 2 & 9 Depression Scale- Over the past 2 weeks, how often have you been bothered by any of the following problems? Little interest or pleasure in doing things: 0 Feeling down, depressed, or hopeless (PHQ Adolescent also includes...irritable): 0 PHQ-2 Total Score: 0 Trouble falling  or staying asleep, or sleeping too much: 3 Feeling tired or having little energy: 1 Poor appetite or overeating (PHQ Adolescent also includes...weight loss): 0 Feeling bad about yourself - or that you are a failure or have let yourself or your family down: 0 Trouble concentrating on things, such as reading the newspaper or watching television (PHQ Adolescent also includes...like school work): 0 Moving or speaking so slowly that other people could have noticed. Or the opposite - being so fidgety or restless that you have been moving around a lot more than usual: 0 Thoughts that you would be better off dead, or of hurting yourself in some way: 0 PHQ-9 Total Score: 4 If you checked off any problems, how difficult have these problems made it for you to do your work, take care of things at home, or get along with other people?: Somewhat difficult  Depression Treatment Depression Interventions/Treatment : EYV7-0 Score <4 Follow-up Not Indicated     Goals Addressed             This Visit's Progress    Patient Stated       10/17/2024, losing weight             Objective:    Today's Vitals   10/17/24 0809  Weight: 298 lb (135.2 kg)  Height: 6' 2 (1.88 m)   Body mass index is 38.26 kg/m.  Hearing/Vision screen Hearing Screening - Comments:: Has hearing aids that are maintained Vision Screening - Comments:: No regular eye exams Immunizations and Health Maintenance Health Maintenance  Topic Date Due   FOOT EXAM  Never done   OPHTHALMOLOGY EXAM  Never done   Diabetic kidney evaluation - Urine ACR  Never done   Hepatitis C Screening  Never done   Zoster Vaccines- Shingrix (1 of 2) Never done   Influenza Vaccine  05/10/2024   COVID-19 Vaccine (5 - 2025-26 season) 06/10/2024   HEMOGLOBIN A1C  02/16/2025   Diabetic kidney evaluation - eGFR measurement  08/19/2025   Medicare Annual Wellness (AWV)  10/17/2025   Pneumococcal Vaccine: 50+ Years  Completed   Meningococcal B Vaccine   Aged Out   DTaP/Tdap/Td  Discontinued   Colonoscopy  Discontinued        Assessment/Plan:  This is a routine wellness examination for [name].  Patient Care Team: Purcell Emil Schanz, MD as PCP - General (Internal Medicine) Johnson, Laymon HERO, PA-C as Physician Assistant (Physician Assistant) Register, Jeoffrey, PA-C as Physician Assistant (Dermatology) Okey Vina GAILS, MD as Consulting Physician (Cardiology)  I have personally reviewed and noted the following in the patients chart:   Medical and social history Use of alcohol, tobacco or illicit drugs  Current medications and supplements including opioid prescriptions. Functional ability and status Nutritional status Physical activity Advanced directives List of other physicians Hospitalizations, surgeries, and ER visits in previous 12 months Vitals Screenings to include cognitive, depression, and falls Referrals and appointments  No orders  of the defined types were placed in this encounter.  In addition, I have reviewed and discussed with patient certain preventive protocols, quality metrics, and best practice recommendations. A written personalized care plan for preventive services as well as general preventive health recommendations were provided to patient.   Ardella FORBES Dawn, LPN   05/10/7972   Return in 1 year (on 10/17/2025).  After Visit Summary: (MyChart) Due to this being a telephonic visit, the after visit summary with patients personalized plan was offered to patient via MyChart   Nurse Notes: HM Addressed: Vaccines Due: flu and shingles Labs Due UACR Due for hep c screening and an eye exam  "

## 2024-10-18 NOTE — Progress Notes (Signed)
 [name]; [id number]; Mar 29, 1946   HPI Patient is a 79 year old white male who was referred to my care by [facility] for evaluation and treatment of a sebaceous cyst on his back.  He was seen by her several weeks ago and was noted to have purulent drainage at that time.  Patient states sebum was expressed.  He was placed on an antibiotic.  Since that time, he has had some serosanguineous drainage but no recurrence of purulent drainage.  He denies any fever or chills. 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    Past Surgical History:  Procedure Laterality Date   CORONARY ANGIOPLASTY WITH STENT PLACEMENT     ENDOVENOUS ABLATION SAPHENOUS VEIN W/ LASER Left 06/02/2022   endovenous laser ablation left greater saphenous vein by Penne Colorado MD   FRACTURE SURGERY  1995   right elbow   KNEE ARTHROSCOPY W/ MENISCAL REPAIR      Family History  Problem Relation Age of Onset   Heart disease Mother 70       heart attack   Lung disease Father        asbestosis   Clotting disorder Father    Cancer Paternal Grandfather        lung   Asthma Daughter    Hyperlipidemia Daughter    Alcohol abuse Paternal Uncle    Sleep apnea Other    Migraines Neg Hx    Seizures Neg Hx    Stroke Neg Hx     Medications Ordered Prior to Encounter[1]  Allergies[2]  Social History   Substance and Sexual Activity  Alcohol Use Yes   Alcohol/week: 3.0 standard drinks of alcohol   Types: 2 Cans of beer, 1 Shots of liquor per week   Comment: occassionally    Tobacco Use History[3]  Review of Systems  Constitutional: Negative.   HENT: Negative.    Eyes: Negative.   Respiratory: Negative.    Cardiovascular: Negative.    Gastrointestinal: Negative.   Genitourinary: Negative.   Musculoskeletal:  Positive for joint pain.  Skin: Negative.   Neurological: Negative.   Endo/Heme/Allergies: Negative.   Psychiatric/Behavioral: Negative.      Objective   Vitals:   10/17/24 1014  BP: 120/72  Pulse: 60  Resp: 18  Temp: 98.2 F (36.8 C)  SpO2: 94%    Physical Exam Vitals reviewed.  Constitutional:      Appearance: Normal appearance. He is not ill-appearing.  HENT:     Head: Normocephalic and atraumatic.  Cardiovascular:     Rate and Rhythm: Normal rate and regular rhythm.     Heart sounds: Normal heart sounds. No murmur heard.    No friction rub. No gallop.  Pulmonary:     Effort: Pulmonary effort is normal. No respiratory distress.     Breath sounds: Normal breath sounds. No stridor. No wheezing, rhonchi or rales.  Skin:    General: Skin is warm and dry.     Comments: Small punctate wound along the lower midline back.  Minimal serous drainage was expressed.  No sebum was present.  No significant induration or erythema is present.  Neurological:     Mental Status: He  is alert and oriented to person, place, and time.     Assessment  Sebaceous cyst, back, resolving.  There is does not appear to be any residual cystic lesion present. Plan  No need for any further surgical excision at the present time.  Patient is fine with that.  Drainage should resolve with time.  I will see him again in 2 weeks for follow-up.  Keep wound clean and dry with soap and water.    [1]  Current Outpatient Medications on File Prior to Visit  Medication Sig Dispense Refill   acetaminophen  (TYLENOL ) 325 MG tablet Take 2 tablets (650 mg total) by mouth every 6 (six) hours as needed for mild pain (or Fever >/= 101). 12 tablet 0   ALPRAZolam  (XANAX ) 0.5 MG tablet Take 1 tablet (0.5 mg total) by mouth daily as needed for anxiety. 30 tablet 1   Docusate Sodium  (COLACE PO) Take by mouth daily.     lisinopril  (ZESTRIL ) 10  MG tablet TAKE 1 TABLET BY MOUTH EVERY DAY 90 tablet 1   metFORMIN  (GLUCOPHAGE ) 500 MG tablet Take 1 tablet (500 mg total) by mouth 2 (two) times daily with a meal. 180 tablet 3   pantoprazole  (PROTONIX ) 40 MG tablet TAKE 1 TABLET BY MOUTH EVERY DAY 90 tablet 3   rosuvastatin  (CRESTOR ) 20 MG tablet TAKE 1 TABLET BY MOUTH EVERY DAY 90 tablet 3   Semaglutide ,0.25 or 0.5MG /DOS, (OZEMPIC, 0.25 OR 0.5 MG/DOSE,) 2 MG/3ML SOPN Inject 0.25 mg into the skin once a week. 3 mL 3   Sennosides (SENOKOT PO) Take by mouth daily.     traMADol  (ULTRAM ) 50 MG tablet TAKE 1 TABLET BY MOUTH EVERY 6 HOURS AS NEEDED 30 tablet 1   XARELTO  20 MG TABS tablet TAKE 1 TABLET BY MOUTH DAILY WITH SUPPER 90 tablet 1   No current facility-administered medications on file prior to visit.  [2] No Known Allergies [3]  Social History Tobacco Use  Smoking Status Former   Current packs/day: 0.00   Average packs/day: 0.3 packs/day for 3.0 years (0.9 ttl pk-yrs)   Types: Cigarettes   Start date: 08/31/1966   Quit date: 08/31/1969   Years since quitting: 55.1  Smokeless Tobacco Never

## 2024-10-21 ENCOUNTER — Telehealth: Payer: Self-pay | Admitting: Neurology

## 2024-10-21 NOTE — Telephone Encounter (Signed)
 [employer] & [employer] medicare supp no auth req.  Patient is scheduled at [facility] for 11/05/2024 at 8 pm.  Mailed packet and sent mychart

## 2024-10-31 ENCOUNTER — Ambulatory Visit (INDEPENDENT_AMBULATORY_CARE_PROVIDER_SITE_OTHER): Admitting: General Surgery

## 2024-10-31 ENCOUNTER — Encounter: Payer: Self-pay | Admitting: General Surgery

## 2024-10-31 ENCOUNTER — Other Ambulatory Visit: Payer: Self-pay

## 2024-10-31 VITALS — BP 117/75 | HR 58 | Temp 97.8°F | Resp 20 | Ht 74.0 in | Wt 310.0 lb

## 2024-10-31 DIAGNOSIS — L723 Sebaceous cyst: Secondary | ICD-10-CM | POA: Diagnosis not present

## 2024-10-31 NOTE — Progress Notes (Signed)
 Subjective:     [name]  Patient here for follow-up of sebaceous cyst on his back.  He has been asymptomatic.  Wife reports that some swelling developed over the past 48 hours.  Some bloody drainage was noted. Objective:    BP 117/75 (BP Location: Left Arm, Patient Position: Sitting, Cuff Size: Large)   Pulse (!) 58   Temp 97.8 F (36.6 C) (Oral)   Resp 20   Ht 6' 2 (1.88 m)   Wt (!) 310 lb (140.6 kg)   SpO2 95%   BMI 39.80 kg/m   General:  alert, cooperative, and no distress  Small residual opening over sebaceous cyst.  A small amount of sebum was blocking the opening and serosanguineous fluid was expressed.  No purulent drainage or erythema noted.     Assessment:    Resolving sebaceous cyst on back.    Plan:   Continue expressing the wound daily and keep it clean with soap and water.  Follow-up here in 1 week for wound check.

## 2024-11-05 ENCOUNTER — Encounter

## 2024-11-07 ENCOUNTER — Ambulatory Visit: Admitting: General Surgery

## 2024-11-08 ENCOUNTER — Other Ambulatory Visit: Payer: Self-pay | Admitting: Emergency Medicine

## 2024-11-08 DIAGNOSIS — F411 Generalized anxiety disorder: Secondary | ICD-10-CM

## 2024-11-13 ENCOUNTER — Other Ambulatory Visit: Payer: Self-pay | Admitting: Emergency Medicine

## 2024-11-25 ENCOUNTER — Encounter

## 2025-02-17 ENCOUNTER — Ambulatory Visit: Admitting: Emergency Medicine

## 2025-10-24 ENCOUNTER — Ambulatory Visit
